# Patient Record
Sex: Female | Born: 1950 | Race: White | Hispanic: No | Marital: Married | Smoking: Former smoker
Health system: Southern US, Community
[De-identification: ages and names within clinical notes are randomized; demographics above are authoritative.]

## PROBLEM LIST (undated history)

## (undated) DIAGNOSIS — N883 Incompetence of cervix uteri: Secondary | ICD-10-CM

## (undated) DIAGNOSIS — I10 Essential (primary) hypertension: Secondary | ICD-10-CM

## (undated) DIAGNOSIS — E119 Type 2 diabetes mellitus without complications: Secondary | ICD-10-CM

## (undated) DIAGNOSIS — I050 Rheumatic mitral stenosis: Secondary | ICD-10-CM

## (undated) DIAGNOSIS — I421 Obstructive hypertrophic cardiomyopathy: Secondary | ICD-10-CM

## (undated) DIAGNOSIS — K219 Gastro-esophageal reflux disease without esophagitis: Secondary | ICD-10-CM

## (undated) DIAGNOSIS — M1990 Unspecified osteoarthritis, unspecified site: Secondary | ICD-10-CM

## (undated) DIAGNOSIS — I480 Paroxysmal atrial fibrillation: Secondary | ICD-10-CM

## (undated) DIAGNOSIS — G43909 Migraine, unspecified, not intractable, without status migrainosus: Secondary | ICD-10-CM

## (undated) DIAGNOSIS — J301 Allergic rhinitis due to pollen: Secondary | ICD-10-CM

## (undated) DIAGNOSIS — Z950 Presence of cardiac pacemaker: Secondary | ICD-10-CM

## (undated) DIAGNOSIS — M5430 Sciatica, unspecified side: Secondary | ICD-10-CM

## (undated) DIAGNOSIS — R011 Cardiac murmur, unspecified: Secondary | ICD-10-CM

## (undated) DIAGNOSIS — R0600 Dyspnea, unspecified: Secondary | ICD-10-CM

## (undated) DIAGNOSIS — Z8489 Family history of other specified conditions: Secondary | ICD-10-CM

## (undated) DIAGNOSIS — I350 Nonrheumatic aortic (valve) stenosis: Secondary | ICD-10-CM

## (undated) DIAGNOSIS — I459 Conduction disorder, unspecified: Secondary | ICD-10-CM

## (undated) HISTORY — DX: Hemochromatosis, unspecified: E83.119

## (undated) HISTORY — PX: CARDIAC SURGERY: SHX584

## (undated) HISTORY — DX: Allergic rhinitis due to pollen: J30.1

## (undated) HISTORY — PX: ELBOW SURGERY: SHX618

## (undated) HISTORY — PX: CARPAL TUNNEL RELEASE: SHX101

## (undated) HISTORY — DX: Sciatica, unspecified side: M54.30

## (undated) HISTORY — PX: PACEMAKER INSERTION: SHX728

## (undated) HISTORY — PX: WISDOM TOOTH EXTRACTION: SHX21

## (undated) HISTORY — DX: Incompetence of cervix uteri: N88.3

## (undated) HISTORY — PX: TONSILLECTOMY: SUR1361

## (undated) HISTORY — DX: Migraine, unspecified, not intractable, without status migrainosus: G43.909

## (undated) HISTORY — PX: TUBAL LIGATION: SHX77

---

## 2003-09-18 ENCOUNTER — Ambulatory Visit (HOSPITAL_COMMUNITY): Admission: RE | Admit: 2003-09-18 | Discharge: 2003-09-18 | Payer: Self-pay | Admitting: Family Medicine

## 2004-12-08 ENCOUNTER — Other Ambulatory Visit: Admission: RE | Admit: 2004-12-08 | Discharge: 2004-12-08 | Payer: Self-pay | Admitting: Family Medicine

## 2005-11-18 ENCOUNTER — Ambulatory Visit (HOSPITAL_COMMUNITY): Admission: RE | Admit: 2005-11-18 | Discharge: 2005-11-18 | Payer: Self-pay | Admitting: Family Medicine

## 2006-09-30 ENCOUNTER — Ambulatory Visit (HOSPITAL_COMMUNITY): Admission: RE | Admit: 2006-09-30 | Discharge: 2006-09-30 | Payer: Self-pay | Admitting: Family Medicine

## 2007-06-06 ENCOUNTER — Ambulatory Visit (HOSPITAL_COMMUNITY): Admission: RE | Admit: 2007-06-06 | Discharge: 2007-06-06 | Payer: Self-pay | Admitting: Family Medicine

## 2008-07-17 ENCOUNTER — Ambulatory Visit (HOSPITAL_COMMUNITY): Admission: RE | Admit: 2008-07-17 | Discharge: 2008-07-17 | Payer: Self-pay | Admitting: Family Medicine

## 2010-04-26 HISTORY — PX: ROTATOR CUFF REPAIR: SHX139

## 2010-09-11 NOTE — Procedures (Signed)
NAME:  [name], [name]                          ACCOUNT NO.:  [id number]   MEDICAL RECORD NO.:  [id number]                   PATIENT TYPE:  OUT   LOCATION:  RAD                                  FACILITY:  [facility]   PHYSICIAN:  [place] Bing, M.D.               DATE OF BIRTH:  09-Dec-1950   DATE OF PROCEDURE:  09/18/2003  DATE OF DISCHARGE:                                  ECHOCARDIOGRAM   CLINICAL DATA:  A 60 year old woman with hypertension and murmur.   M-MODE TRACINGS:  Aorta 2.4.   Left atrium 4.1.   Septum 1.5.   Posterior wall 1.2.   Left ventricular diastole 4.1.   Left ventricular systole 2.5.   IMPRESSION:  1. Technically adequate echocardiographic study.  2. Left atrial size at the upper limit of normal; normal right atrium.     Normal right ventricular size and function; mild right ventricular     hypertrophy. Probable small PFO.  3. Normal aortic, mitral, and tricuspid valves; mild aortic insufficiency.  4. Normal inferior vena cava.  5. Normal internal dimension of the left ventricle; mild to moderate left     ventricular hypertrophy. Disproportionate septal thickening; normal     regional and global left ventricular systolic function.      ___________________________________________                                            [place] Bing, M.D.   RR/MEDQ  D:  09/18/2003  T:  09/18/2003  Job:  [id number]

## 2010-11-06 ENCOUNTER — Other Ambulatory Visit (HOSPITAL_COMMUNITY): Payer: Self-pay | Admitting: Family Medicine

## 2010-11-06 DIAGNOSIS — Z139 Encounter for screening, unspecified: Secondary | ICD-10-CM

## 2010-11-10 ENCOUNTER — Ambulatory Visit (HOSPITAL_COMMUNITY): Payer: Self-pay

## 2010-11-17 ENCOUNTER — Telehealth: Payer: Self-pay

## 2010-11-17 NOTE — Telephone Encounter (Signed)
Called, busy

## 2010-11-19 NOTE — Telephone Encounter (Signed)
Mailed letter to call and schedule colonoscopy. Referred from Dr. Phillips Odor.

## 2011-02-09 ENCOUNTER — Encounter (HOSPITAL_COMMUNITY): Payer: Worker's Compensation

## 2011-02-09 ENCOUNTER — Ambulatory Visit (HOSPITAL_COMMUNITY)
Admission: RE | Admit: 2011-02-09 | Discharge: 2011-02-09 | Disposition: A | Discharge status: Home / Self Care | Payer: Worker's Compensation | Source: Ambulatory Visit | Attending: Specialist | Admitting: Specialist

## 2011-02-09 ENCOUNTER — Other Ambulatory Visit: Payer: Self-pay | Admitting: Specialist

## 2011-02-09 ENCOUNTER — Other Ambulatory Visit (HOSPITAL_COMMUNITY): Payer: Self-pay | Admitting: Specialist

## 2011-02-09 DIAGNOSIS — Z01812 Encounter for preprocedural laboratory examination: Secondary | ICD-10-CM | POA: Insufficient documentation

## 2011-02-09 DIAGNOSIS — Z01811 Encounter for preprocedural respiratory examination: Secondary | ICD-10-CM | POA: Insufficient documentation

## 2011-02-09 DIAGNOSIS — S43429A Sprain of unspecified rotator cuff capsule, initial encounter: Secondary | ICD-10-CM | POA: Insufficient documentation

## 2011-02-09 DIAGNOSIS — X58XXXA Exposure to other specified factors, initial encounter: Secondary | ICD-10-CM | POA: Insufficient documentation

## 2011-02-09 DIAGNOSIS — Z01810 Encounter for preprocedural cardiovascular examination: Secondary | ICD-10-CM | POA: Insufficient documentation

## 2011-02-09 LAB — BASIC METABOLIC PANEL
BUN: 24 mg/dL — ABNORMAL HIGH (ref 6–23)
CO2: 28 mEq/L (ref 19–32)
Chloride: 103 mEq/L (ref 96–112)
Potassium: 4.2 mEq/L (ref 3.5–5.1)
Sodium: 140 mEq/L (ref 135–145)

## 2011-02-09 LAB — CBC
HCT: 40.7 % (ref 36.0–46.0)
Hemoglobin: 13.9 g/dL (ref 12.0–15.0)
MCH: 30.6 pg (ref 26.0–34.0)
MCHC: 34.2 g/dL (ref 30.0–36.0)
MCV: 89.6 fL (ref 78.0–100.0)
Platelets: 278 10*3/uL (ref 150–400)
RBC: 4.54 MIL/uL (ref 3.87–5.11)
RDW: 13 % (ref 11.5–15.5)
WBC: 8.1 10*3/uL (ref 4.0–10.5)

## 2011-02-11 ENCOUNTER — Ambulatory Visit (HOSPITAL_COMMUNITY)
Admission: RE | Admit: 2011-02-11 | Discharge: 2011-02-12 | Disposition: A | Discharge status: Home / Self Care | Payer: Worker's Compensation | Source: Ambulatory Visit | Attending: Specialist | Admitting: Specialist

## 2011-02-11 DIAGNOSIS — Z01818 Encounter for other preprocedural examination: Secondary | ICD-10-CM | POA: Insufficient documentation

## 2011-02-11 DIAGNOSIS — S43429A Sprain of unspecified rotator cuff capsule, initial encounter: Secondary | ICD-10-CM | POA: Insufficient documentation

## 2011-02-11 DIAGNOSIS — Z01812 Encounter for preprocedural laboratory examination: Secondary | ICD-10-CM | POA: Insufficient documentation

## 2011-02-11 DIAGNOSIS — K219 Gastro-esophageal reflux disease without esophagitis: Secondary | ICD-10-CM | POA: Insufficient documentation

## 2011-02-11 DIAGNOSIS — X58XXXA Exposure to other specified factors, initial encounter: Secondary | ICD-10-CM | POA: Insufficient documentation

## 2011-02-11 DIAGNOSIS — E669 Obesity, unspecified: Secondary | ICD-10-CM | POA: Insufficient documentation

## 2011-02-11 DIAGNOSIS — Z01810 Encounter for preprocedural cardiovascular examination: Secondary | ICD-10-CM | POA: Insufficient documentation

## 2011-02-11 DIAGNOSIS — I10 Essential (primary) hypertension: Secondary | ICD-10-CM | POA: Insufficient documentation

## 2011-02-12 LAB — BASIC METABOLIC PANEL
Calcium: 9.4 mg/dL (ref 8.4–10.5)
Chloride: 99 mEq/L (ref 96–112)
Creatinine, Ser: 0.92 mg/dL (ref 0.50–1.10)
GFR calc non Af Amer: 65 mL/min — ABNORMAL LOW (ref >90)
Potassium: 3.8 mEq/L (ref 3.5–5.1)

## 2011-02-12 LAB — CBC
HCT: 37.6 % (ref 36.0–46.0)
Hemoglobin: 12.9 g/dL (ref 12.0–15.0)
MCV: 89.7 fL (ref 78.0–100.0)
RBC: 4.19 MIL/uL (ref 3.87–5.11)

## 2011-02-12 NOTE — Op Note (Signed)
[name], [name]                ACCOUNT NO.:  [id number]  MEDICAL RECORD NO.:  [id number]  LOCATION:  1603                         FACILITY:  [facility]  PHYSICIAN:  Jene Every, M.D.    DATE OF BIRTH:  [birth year]  DATE OF PROCEDURE:  02/11/2011 DATE OF DISCHARGE:                              OPERATIVE REPORT   PREOPERATIVE DIAGNOSIS:  Capsulitis and massive tear of the rotator cuff.  POSTOPERATIVE DIAGNOSIS:  Capsulitis and massive tear of the rotator cuff.  PROCEDURE PERFORMED: 1. Manipulation under anesthesia. 2. Mini open rotator cuff repair, subacromial decompression, repair of     rotator cuff utilizing Mitek suture anchors as well as push lock. 3. Lavage of glenohumeral joint.  ANESTHESIA:  General.  ASSISTANT:  Strader, utilized for soft tissue retraction and holding of the upper extremity.  HISTORY:  This is a pleasant 60 year old who sustained a work-related injury with massive tear of the rotator cuff.  The patient was actually injured 3 months previously.  She had a retracted tear of the rotator cuff.  No wedge infection or fracture of the humeral head.  Indicated for repair and exam under anesthesia.  Risks and benefits discussed including bleeding, infection, damage to neurovascular structures, suboptimal range of motion, DVT, PE, anesthetic complication, and nonhealing etc.  TECHNIQUE:  The patient in supine beach-chair position, after induction of adequate anesthesia and 2 g of Kefzol, the right shoulder and upper extremity were prepped and draped in usual sterile fashion. Manipulation under anesthesia was performed.  She had limited abduction and forward flexion, both augmented by gentle manipulation with appreciation of lysis of adhesions.  This was performed, securing the humerus proximally.  Next, surgical marker was utilized to delineate the acromion of the [facility] joint, a small 2 cm incision was made over the anterolateral aspect of the acromion.   Subcutaneous tissue was dissected with Bovie cautery utilized  to achieve hemostasis.  The raphe between the anterolateral heads was identified and divided in line with skin incision.  Subperiosteal elevated from the anterolateral and anteromedial aspect of the acromion with the release of the CA ligament and its small spur to the anterolateral aspect of the acromion was removed with a Theatre manager.  Noted was a wide retracted tear of the rotator cuff.  Significant subacromial and subrotator cuff adhesions, and they were gently, but meticulously lysed with a Cobb elevator extending, not more proximal than the glenohumeral joint.  This was done posteriorly, laterally, and anteriorly to a point over the humeral head. I did not extend distal to that.  Mobilizing the cuff revealed an intact biceps tendon, that is very attenuated, subscap residual was noted. There was a significant retracted tear of the infraspinatus, supraspinatus back to  near the midportion of the humeral head.  They were mobilized, I prepared the bed lateral to the articular surface. Medial to the greater tuberosity with a Matt Holmes rongeur.  Put 2 Mitek suture anchors and advanced the supraspinatus, the subscap, and the rotator cuff and the infraspinatus to that bed with Mitek suture anchors in excellent resistance to pullout.  They were threaded through the tendon and infraspinatus and supraspinatus, the arm in the abducted position.  Performed an appropriate surgical knot, crossed these suture and secured them over the lateral aspect of the greater tuberosity with the push locks utilizing all insertion of the suture and through the push lock and impaction of the humeral head with an excellent coverage noted of the humeral head by the supraspinatus and infraspinatus.  The superior portion of the subscap was examined.  There was a very little portion of the subscap noted.  I did, however, incorporate that with  the supraspinatus in the lateral aspect of the tuberosity.  This was in the superior portion of  that and there was a  portion of the subscapular was not advanceable, and it was a very attenuated.  We oversaw the subscap with supraspinatus with 0 Vicryl interrupted figure-of-eight sutures.  This was also included the Ethibond sutures in a double row technique.  Following this, we had full closure and coverage.  The wound was copiously irrigated.  Arm was in a gently abducted position, we advanced it approximately 1.5 cm.  Next, copiously irrigated the wound. Inspection revealed no evidence of active bleeding.  Repaired the raphe with #1 Vicryl in a figure-of-8 sutures over the top through the acromion, subcu with 2-0 Vicryl simple sutures.  Skin was reapproximated with 4-0 subcuticular Prolene.  Wound reinforced with Steri-Strips. Sterile dressing applied.  Placed supine on hospital bed and an abduction pillow, extubated without difficulty, and transported to the recovery room in satisfactory condition.  The patient tolerated the procedure well.  No complications.  Assistant was [employer].  Minimal blood loss.     Jene Every, M.D.     [name]  D:  02/11/2011  T:  02/11/2011  Job:  [id number]  Electronically Signed by Jene Every M.D. on 02/12/2011 09:14:19 AM

## 2011-04-09 ENCOUNTER — Other Ambulatory Visit (HOSPITAL_COMMUNITY): Payer: Self-pay | Admitting: Internal Medicine

## 2011-04-09 DIAGNOSIS — R609 Edema, unspecified: Secondary | ICD-10-CM

## 2011-04-12 ENCOUNTER — Ambulatory Visit (HOSPITAL_COMMUNITY)
Admission: RE | Admit: 2011-04-12 | Discharge: 2011-04-12 | Disposition: A | Discharge status: Home / Self Care | Payer: Worker's Compensation | Source: Ambulatory Visit | Attending: Internal Medicine | Admitting: Internal Medicine

## 2011-04-12 DIAGNOSIS — R609 Edema, unspecified: Secondary | ICD-10-CM

## 2011-04-12 DIAGNOSIS — M7989 Other specified soft tissue disorders: Secondary | ICD-10-CM | POA: Insufficient documentation

## 2011-08-02 ENCOUNTER — Telehealth: Payer: Self-pay

## 2011-08-02 NOTE — Telephone Encounter (Signed)
Pt would like a copy of her x-ray to have so she can take to an appt she has on Thursday 08-05-11 please call when ready for pick-up

## 2011-08-02 NOTE — Telephone Encounter (Signed)
Left message that CD ready for pick up to CB if any questions.

## 2012-05-19 ENCOUNTER — Other Ambulatory Visit (HOSPITAL_COMMUNITY): Payer: Self-pay | Admitting: Family Medicine

## 2012-05-19 DIAGNOSIS — Z139 Encounter for screening, unspecified: Secondary | ICD-10-CM

## 2012-05-25 ENCOUNTER — Ambulatory Visit (HOSPITAL_COMMUNITY)
Admission: RE | Admit: 2012-05-25 | Discharge: 2012-05-25 | Disposition: A | Discharge status: Home / Self Care | Payer: BC Managed Care – PPO | Source: Ambulatory Visit | Attending: Family Medicine | Admitting: Family Medicine

## 2012-05-25 DIAGNOSIS — Z139 Encounter for screening, unspecified: Secondary | ICD-10-CM

## 2012-05-25 DIAGNOSIS — Z1231 Encounter for screening mammogram for malignant neoplasm of breast: Secondary | ICD-10-CM | POA: Insufficient documentation

## 2012-06-20 ENCOUNTER — Telehealth: Payer: Self-pay

## 2012-06-20 NOTE — Telephone Encounter (Signed)
LM for pt to call to schedule colonoscopy.  

## 2012-07-06 NOTE — Telephone Encounter (Signed)
LM for pt to call

## 2012-07-07 NOTE — Telephone Encounter (Signed)
Letter to pt

## 2012-07-12 NOTE — Telephone Encounter (Signed)
Letter to PCP

## 2013-10-02 ENCOUNTER — Other Ambulatory Visit (HOSPITAL_COMMUNITY): Payer: Self-pay | Admitting: Family Medicine

## 2013-10-02 DIAGNOSIS — Z139 Encounter for screening, unspecified: Secondary | ICD-10-CM

## 2013-10-04 ENCOUNTER — Ambulatory Visit (HOSPITAL_COMMUNITY)
Admission: RE | Admit: 2013-10-04 | Discharge: 2013-10-04 | Disposition: A | Discharge status: Home / Self Care | Payer: Medicare PPO | Source: Ambulatory Visit | Attending: Family Medicine | Admitting: Family Medicine

## 2013-10-04 DIAGNOSIS — Z139 Encounter for screening, unspecified: Secondary | ICD-10-CM

## 2013-10-04 DIAGNOSIS — Z1231 Encounter for screening mammogram for malignant neoplasm of breast: Secondary | ICD-10-CM | POA: Insufficient documentation

## 2013-12-19 ENCOUNTER — Other Ambulatory Visit (HOSPITAL_COMMUNITY): Payer: Self-pay | Admitting: Physician Assistant

## 2013-12-19 ENCOUNTER — Ambulatory Visit (HOSPITAL_COMMUNITY)
Admission: RE | Admit: 2013-12-19 | Discharge: 2013-12-19 | Disposition: A | Discharge status: Home / Self Care | Payer: Medicare PPO | Source: Ambulatory Visit | Attending: Physician Assistant | Admitting: Physician Assistant

## 2013-12-19 ENCOUNTER — Encounter (INDEPENDENT_AMBULATORY_CARE_PROVIDER_SITE_OTHER): Payer: Self-pay

## 2013-12-19 DIAGNOSIS — M545 Low back pain, unspecified: Secondary | ICD-10-CM | POA: Diagnosis present

## 2013-12-19 DIAGNOSIS — M5137 Other intervertebral disc degeneration, lumbosacral region: Secondary | ICD-10-CM | POA: Insufficient documentation

## 2013-12-19 DIAGNOSIS — M51379 Other intervertebral disc degeneration, lumbosacral region without mention of lumbar back pain or lower extremity pain: Secondary | ICD-10-CM | POA: Insufficient documentation

## 2013-12-19 DIAGNOSIS — M25559 Pain in unspecified hip: Secondary | ICD-10-CM | POA: Insufficient documentation

## 2013-12-20 ENCOUNTER — Other Ambulatory Visit (HOSPITAL_COMMUNITY): Payer: Self-pay | Admitting: Physician Assistant

## 2013-12-20 DIAGNOSIS — M545 Low back pain: Secondary | ICD-10-CM

## 2013-12-25 ENCOUNTER — Ambulatory Visit (HOSPITAL_COMMUNITY)
Admission: RE | Admit: 2013-12-25 | Discharge: 2013-12-25 | Disposition: A | Discharge status: Home / Self Care | Payer: Medicare PPO | Source: Ambulatory Visit | Attending: Physician Assistant | Admitting: Physician Assistant

## 2013-12-25 DIAGNOSIS — M545 Low back pain, unspecified: Secondary | ICD-10-CM | POA: Diagnosis present

## 2013-12-25 DIAGNOSIS — W19XXXA Unspecified fall, initial encounter: Secondary | ICD-10-CM | POA: Diagnosis not present

## 2013-12-25 DIAGNOSIS — S32009A Unspecified fracture of unspecified lumbar vertebra, initial encounter for closed fracture: Secondary | ICD-10-CM | POA: Insufficient documentation

## 2013-12-25 DIAGNOSIS — E279 Disorder of adrenal gland, unspecified: Secondary | ICD-10-CM | POA: Insufficient documentation

## 2013-12-25 DIAGNOSIS — S22009A Unspecified fracture of unspecified thoracic vertebra, initial encounter for closed fracture: Secondary | ICD-10-CM | POA: Diagnosis not present

## 2014-05-23 ENCOUNTER — Other Ambulatory Visit (HOSPITAL_COMMUNITY): Payer: Self-pay | Admitting: Family Medicine

## 2014-05-23 DIAGNOSIS — E6609 Other obesity due to excess calories: Secondary | ICD-10-CM | POA: Diagnosis not present

## 2014-05-23 DIAGNOSIS — Z6832 Body mass index (BMI) 32.0-32.9, adult: Secondary | ICD-10-CM | POA: Diagnosis not present

## 2014-05-23 DIAGNOSIS — M545 Low back pain: Secondary | ICD-10-CM

## 2014-05-23 DIAGNOSIS — Z0000 Encounter for general adult medical examination without abnormal findings: Secondary | ICD-10-CM | POA: Diagnosis not present

## 2014-05-28 ENCOUNTER — Encounter (INDEPENDENT_AMBULATORY_CARE_PROVIDER_SITE_OTHER): Payer: Self-pay | Admitting: *Deleted

## 2014-05-28 ENCOUNTER — Ambulatory Visit (HOSPITAL_COMMUNITY)
Admission: RE | Admit: 2014-05-28 | Discharge: 2014-05-28 | Disposition: A | Discharge status: Home / Self Care | Payer: Commercial Managed Care - HMO | Source: Ambulatory Visit | Attending: Family Medicine | Admitting: Family Medicine

## 2014-05-28 DIAGNOSIS — Z780 Asymptomatic menopausal state: Secondary | ICD-10-CM | POA: Diagnosis not present

## 2014-05-28 DIAGNOSIS — M8580 Other specified disorders of bone density and structure, unspecified site: Secondary | ICD-10-CM | POA: Diagnosis not present

## 2014-05-28 DIAGNOSIS — M8589 Other specified disorders of bone density and structure, multiple sites: Secondary | ICD-10-CM | POA: Diagnosis not present

## 2014-05-28 DIAGNOSIS — M545 Low back pain: Secondary | ICD-10-CM

## 2014-05-28 DIAGNOSIS — R29890 Loss of height: Secondary | ICD-10-CM | POA: Insufficient documentation

## 2014-06-06 ENCOUNTER — Encounter (INDEPENDENT_AMBULATORY_CARE_PROVIDER_SITE_OTHER): Payer: Self-pay | Admitting: *Deleted

## 2014-06-06 ENCOUNTER — Other Ambulatory Visit (INDEPENDENT_AMBULATORY_CARE_PROVIDER_SITE_OTHER): Payer: Self-pay | Admitting: *Deleted

## 2014-06-06 DIAGNOSIS — Z1211 Encounter for screening for malignant neoplasm of colon: Secondary | ICD-10-CM

## 2014-06-12 DIAGNOSIS — H524 Presbyopia: Secondary | ICD-10-CM | POA: Diagnosis not present

## 2014-06-12 DIAGNOSIS — H5210 Myopia, unspecified eye: Secondary | ICD-10-CM | POA: Diagnosis not present

## 2014-06-24 ENCOUNTER — Telehealth (INDEPENDENT_AMBULATORY_CARE_PROVIDER_SITE_OTHER): Payer: Self-pay | Admitting: *Deleted

## 2014-06-24 DIAGNOSIS — Z1211 Encounter for screening for malignant neoplasm of colon: Secondary | ICD-10-CM

## 2014-06-24 NOTE — Telephone Encounter (Signed)
Patient needs movi prep 

## 2014-06-25 ENCOUNTER — Telehealth (INDEPENDENT_AMBULATORY_CARE_PROVIDER_SITE_OTHER): Payer: Self-pay | Admitting: *Deleted

## 2014-06-25 DIAGNOSIS — Z1211 Encounter for screening for malignant neoplasm of colon: Secondary | ICD-10-CM

## 2014-06-25 MED ORDER — PEG-KCL-NACL-NASULF-NA ASC-C 100 G PO SOLR: 1.0000 | Freq: Once | ORAL | Status: DC

## 2014-06-25 NOTE — Telephone Encounter (Addendum)
Patient needs trilyte, movi prep not covered

## 2014-06-28 MED ORDER — PEG 3350-KCL-NA BICARB-NACL 420 G PO SOLR: 4000.0000 mL | Freq: Once | ORAL | Status: DC

## 2014-07-05 ENCOUNTER — Encounter (INDEPENDENT_AMBULATORY_CARE_PROVIDER_SITE_OTHER): Payer: Self-pay | Admitting: *Deleted

## 2014-07-05 ENCOUNTER — Telehealth (INDEPENDENT_AMBULATORY_CARE_PROVIDER_SITE_OTHER): Payer: Self-pay | Admitting: *Deleted

## 2014-07-05 NOTE — Telephone Encounter (Signed)
Referring MD/PCP: koberlein   Procedure: tcs  Reason/Indication:  screening  Has patient had this procedure before?  no  If so, when, by whom and where?    Is there a family history of colon cancer?  no  Who?  What age when diagnosed?    Is patient diabetic?   no      Does patient have prosthetic heart valve?  no  Do you have a pacemaker?  no  Has patient ever had endocarditis? no  Has patient had joint replacement within last 12 months?  no  Does patient tend to be constipated or take laxatives? no  Is patient on Coumadin, Plavix and/or Aspirin? no  Medications: losartan/hctz 100/25 mg daily, omeprazole 40 mg daily, meloxicam 7.5 mg daily  Allergies: statin drugs  Medication Adjustment:   Procedure date & time: 08/01/14 at 1025

## 2014-07-09 NOTE — Telephone Encounter (Signed)
agree

## 2014-08-01 ENCOUNTER — Encounter (HOSPITAL_COMMUNITY): Payer: Self-pay | Admitting: *Deleted

## 2014-08-01 ENCOUNTER — Ambulatory Visit (HOSPITAL_COMMUNITY)
Admission: RE | Admit: 2014-08-01 | Discharge: 2014-08-01 | Disposition: A | Discharge status: Home / Self Care | Payer: Commercial Managed Care - HMO | Source: Ambulatory Visit | Attending: Internal Medicine | Admitting: Internal Medicine

## 2014-08-01 ENCOUNTER — Encounter (HOSPITAL_COMMUNITY)
Admission: RE | Disposition: A | Discharge status: Home / Self Care | Payer: Self-pay | Source: Ambulatory Visit | Attending: Internal Medicine

## 2014-08-01 DIAGNOSIS — K5730 Diverticulosis of large intestine without perforation or abscess without bleeding: Secondary | ICD-10-CM | POA: Diagnosis not present

## 2014-08-01 DIAGNOSIS — I10 Essential (primary) hypertension: Secondary | ICD-10-CM | POA: Insufficient documentation

## 2014-08-01 DIAGNOSIS — Z87891 Personal history of nicotine dependence: Secondary | ICD-10-CM | POA: Insufficient documentation

## 2014-08-01 DIAGNOSIS — D128 Benign neoplasm of rectum: Secondary | ICD-10-CM | POA: Diagnosis not present

## 2014-08-01 DIAGNOSIS — Z79899 Other long term (current) drug therapy: Secondary | ICD-10-CM | POA: Insufficient documentation

## 2014-08-01 DIAGNOSIS — Z9851 Tubal ligation status: Secondary | ICD-10-CM | POA: Diagnosis not present

## 2014-08-01 DIAGNOSIS — R938 Abnormal findings on diagnostic imaging of other specified body structures: Secondary | ICD-10-CM | POA: Insufficient documentation

## 2014-08-01 DIAGNOSIS — D123 Benign neoplasm of transverse colon: Secondary | ICD-10-CM | POA: Insufficient documentation

## 2014-08-01 DIAGNOSIS — Z1211 Encounter for screening for malignant neoplasm of colon: Secondary | ICD-10-CM | POA: Diagnosis not present

## 2014-08-01 DIAGNOSIS — Z7951 Long term (current) use of inhaled steroids: Secondary | ICD-10-CM | POA: Diagnosis not present

## 2014-08-01 DIAGNOSIS — D125 Benign neoplasm of sigmoid colon: Secondary | ICD-10-CM | POA: Insufficient documentation

## 2014-08-01 DIAGNOSIS — K6289 Other specified diseases of anus and rectum: Secondary | ICD-10-CM | POA: Diagnosis not present

## 2014-08-01 HISTORY — PX: COLONOSCOPY: SHX5424

## 2014-08-01 HISTORY — DX: Cardiac murmur, unspecified: R01.1

## 2014-08-01 HISTORY — DX: Essential (primary) hypertension: I10

## 2014-08-01 SURGERY — COLONOSCOPY
Anesthesia: Moderate Sedation

## 2014-08-01 MED ORDER — MEPERIDINE HCL 50 MG/ML IJ SOLN
INTRAMUSCULAR | Status: AC
  Filled 2014-08-01: qty 1

## 2014-08-01 MED ORDER — BENEFIBER DRINK MIX PO PACK: 4.0000 g | PACK | Freq: Every day | ORAL | Status: DC

## 2014-08-01 MED ORDER — MEPERIDINE HCL 50 MG/ML IJ SOLN
INTRAMUSCULAR | Status: DC | PRN
  Administered 2014-08-01 (×2): 25 mg via INTRAVENOUS

## 2014-08-01 MED ORDER — SODIUM CHLORIDE 0.9 % IV SOLN
INTRAVENOUS | Status: DC
  Administered 2014-08-01: 10:00:00 via INTRAVENOUS

## 2014-08-01 MED ORDER — DICYCLOMINE HCL 10 MG PO CAPS: 10.0000 mg | ORAL_CAPSULE | Freq: Three times a day (TID) | ORAL | Status: DC | PRN

## 2014-08-01 MED ORDER — MIDAZOLAM HCL 5 MG/5ML IJ SOLN
INTRAMUSCULAR | Status: DC | PRN
  Administered 2014-08-01: 2 mg via INTRAVENOUS
  Administered 2014-08-01: 3 mg via INTRAVENOUS
  Administered 2014-08-01 (×2): 2 mg via INTRAVENOUS

## 2014-08-01 MED ORDER — SIMETHICONE 40 MG/0.6ML PO SUSP
ORAL | Status: DC | PRN
  Administered 2014-08-01: 10:00:00

## 2014-08-01 MED ORDER — MIDAZOLAM HCL 5 MG/5ML IJ SOLN
INTRAMUSCULAR | Status: AC
  Filled 2014-08-01: qty 10

## 2014-08-01 NOTE — H&P (Signed)
[name] is an 64 y.o. female.   Chief Complaint: Patient's here for colonoscopy. HPI: Patient is 64 year old Caucasian female who is here for screening examination. This is patient's first exam. She denies abdominal pain or rectal bleeding. Lately she's noted postprandial bloating and some urgency. She has good appetite her weight has been stable. Family history is negative for CRC.  Past Medical History  Diagnosis Date  . Hypertension   . Heart murmur     Past Surgical History  Procedure Laterality Date  . Tubal ligation    . Tonsillectomy    . Rotator cuff repair Right 2012    History reviewed. No pertinent family history. Social History:  reports that she has quit smoking. She does not have any smokeless tobacco history on file. She reports that she drinks alcohol. She reports that she does not use illicit drugs.  Allergies: No Known Allergies  Medications Prior to Admission  Medication Sig Dispense Refill  . fluticasone (FLONASE) 50 MCG/ACT nasal spray Place 2 sprays into both nostrils daily as needed for allergies or rhinitis.    Marland Kitchen losartan-hydrochlorothiazide (HYZAAR) 100-25 MG per tablet Take 1 tablet by mouth daily.  3  . omeprazole (PRILOSEC) 40 MG capsule Take 40 mg by mouth daily.  3  . polyethylene glycol-electrolytes (NULYTELY/GOLYTELY) 420 G solution Take 4,000 mLs by mouth once. 4000 mL 0  . zolpidem (AMBIEN) 10 MG tablet Take 5 mg by mouth at bedtime.  2    No results found for this or any previous visit (from the past 48 hour(s)). No results found.  ROS  Blood pressure 179/87, pulse 67, temperature 97.6 F (36.4 C), temperature source Oral, resp. rate 18, height 5\' 10"  (1.778 m), weight 227 lb (102.967 kg), SpO2 95 %. Physical Exam  Constitutional: She appears well-developed and well-nourished.  HENT:  Mouth/Throat: Oropharynx is clear and moist.  Eyes: Conjunctivae are normal. No scleral icterus.  Neck: No thyromegaly present.  Cardiovascular:  Normal rate and regular rhythm.   Murmur (grade 2/6 systolic ejection murmur best heard at left sternal border.) heard. GI: Soft. She exhibits no distension and no mass. There is no tenderness.  Musculoskeletal: She exhibits no edema.  Lymphadenopathy:    She has no cervical adenopathy.  Neurological: She is alert.  Skin: Skin is warm and dry.     Assessment/Plan Average risk screening colonoscopy.  Pierce Barocio U 08/01/2014, 9:54 AM

## 2014-08-01 NOTE — Op Note (Signed)
COLONOSCOPY PROCEDURE REPORT  PATIENT:  [name]  MR#:  [id number] Birthdate:  03-Jun-1950, 64 y.o., female Endoscopist:  Dr. Rogene Houston, MD Referred By:  Dr. Purvis Kilts, MD  Procedure Date: 08/01/2014  Procedure:   Colonoscopy  Indications:  Patient is 32 old Caucasian female was undergoing average risk screening colonoscopy. This is patient's first exam. She does give history of intermittent postprandial bloating and urgency.  Informed Consent:  The procedure and risks were reviewed with the patient and informed consent was obtained.  Medications:  Demerol 50 mg IV Versed 9 mg IV  Description of procedure:  After a digital rectal exam was performed, that colonoscope was advanced from the anus through the rectum and colon to the area of the cecum, ileocecal valve and appendiceal orifice. The cecum was deeply intubated. These structures were well-seen and photographed for the record. From the level of the cecum and ileocecal valve, the scope was slowly and cautiously withdrawn. The mucosal surfaces were carefully surveyed utilizing scope tip to flexion to facilitate fold flattening as needed. The scope was pulled down into the rectum where a thorough exam including retroflexion was performed.  Findings:  Prep excellent. Two small polyps ablated via cold biopsy and submitted together. One was located at proximal transverse colon and other one at proximal sigmoid colon. Moderate number of diverticula at sigmoid colon. Normal rectal mucosa. Focal thickening to anoderm.   Therapeutic/Diagnostic Maneuvers Performed:  See above  Complications:  None  Cecal Withdrawal Time:  14 minutes  Impression:  Examination performed to cecum. Two small polyps ablated while cold biopsy and submitted together(transverse and sigmoid colon). Moderate sigmoid colon diverticulosis.  Recommendations:  Standard instructions given. High fiber diet. Benefiber 4 g by mouth daily at  bedtime. Dicyclomine 10 mg by mouth three times a day when necessary. I will be contacting patient with biopsy results and further recommendations.  REHMAN,NAJEEB U  08/01/2014 10:32 AM  CC: Dr. [name], Betsy Coder, MD & Dr. Rayne Du ref. provider found

## 2014-08-01 NOTE — Discharge Instructions (Signed)
Resume usual medications. Dicyclomine 10 mg by mouth up to three times a day as needed  High fiber diet. Benefiber 4 g by mouth daily at bedtime No driving for 24 hours. Physician will call with biopsy results  Colonoscopy, Care After These instructions give you information on caring for yourself after your procedure. Your doctor may also give you more specific instructions. Call your doctor if you have any problems or questions after your procedure. HOME CARE  Do not drive for 24 hours.  Do not sign important papers or use machinery for 24 hours.  You may shower.  You may go back to your usual activities, but go slower for the first 24 hours.  Take rest breaks often during the first 24 hours.  Walk around or use warm packs on your belly (abdomen) if you have belly cramping or gas.  Drink enough fluids to keep your pee (urine) clear or pale yellow.  Resume your normal diet. Avoid heavy or fried foods.  Avoid drinking alcohol for 24 hours or as told by your doctor.  Only take medicines as told by your doctor. If a tissue sample (biopsy) was taken during the procedure:   Do not take aspirin or blood thinners for 7 days, or as told by your doctor.  Do not drink alcohol for 7 days, or as told by your doctor.  Eat soft foods for the first 24 hours. GET HELP IF: You still have a small amount of blood in your poop (stool) 2-3 days after the procedure. GET HELP RIGHT AWAY IF:  You have more than a small amount of blood in your poop.  You see clumps of tissue (blood clots) in your poop.  Your belly is puffy (swollen).  You feel sick to your stomach (nauseous) or throw up (vomit).  You have a fever.  You have belly pain that gets worse and medicine does not help. MAKE SURE YOU:  Understand these instructions.  Will watch your condition.  Will get help right away if you are not doing well or get worse. Document Released: 05/15/2010 Document Revised: 04/17/2013 Document  Reviewed: 12/18/2012 [facility] Patient Information 2015 [place], [place]. This information is not intended to replace advice given to you by your health care provider. Make sure you discuss any questions you have with your health care provider.   Diverticulosis Diverticulosis is the condition that develops when small pouches (diverticula) form in the wall of your colon. Your colon, or large intestine, is where water is absorbed and stool is formed. The pouches form when the inside layer of your colon pushes through weak spots in the outer layers of your colon. CAUSES  No one knows exactly what causes diverticulosis. RISK FACTORS  Being older than 49. Your risk for this condition increases with age. Diverticulosis is rare in people younger than 40 years. By age 33, almost everyone has it.  Eating a low-fiber diet.  Being frequently constipated.  Being overweight.  Not getting enough exercise.  Smoking.  Taking over-the-counter pain medicines, like aspirin and ibuprofen. SYMPTOMS  Most people with diverticulosis do not have symptoms. DIAGNOSIS  Because diverticulosis often has no symptoms, health care providers often discover the condition during an exam for other colon problems. In many cases, a health care provider will diagnose diverticulosis while using a flexible scope to examine the colon (colonoscopy). TREATMENT  If you have never developed an infection related to diverticulosis, you may not need treatment. If you have had an infection before, treatment  may include:  Eating more fruits, vegetables, and grains.  Taking a fiber supplement.  Taking a live bacteria supplement (probiotic).  Taking medicine to relax your colon. HOME CARE INSTRUCTIONS   Drink at least 6-8 glasses of water each day to prevent constipation.  Try not to strain when you have a bowel movement.  Keep all follow-up appointments. If you have had an infection before:  Increase the fiber in your diet  as directed by your health care provider or dietitian.  Take a dietary fiber supplement if your health care provider approves.  Only take medicines as directed by your health care provider. SEEK MEDICAL CARE IF:   You have abdominal pain.  You have bloating.  You have cramps.  You have not gone to the bathroom in 3 days. SEEK IMMEDIATE MEDICAL CARE IF:   Your pain gets worse.  Yourbloating becomes very bad.  You have a fever or chills, and your symptoms suddenly get worse.  You begin vomiting.  You have bowel movements that are bloody or black. MAKE SURE YOU:  Understand these instructions.  Will watch your condition.  Will get help right away if you are not doing well or get worse. Document Released: 01/08/2004 Document Revised: 04/17/2013 Document Reviewed: 03/07/2013 [facility] Patient Information 2015 [place], [place]. This information is not intended to replace advice given to you by your health care provider. Make sure you discuss any questions you have with your health care provider.  Colon Polyps Polyps are lumps of extra tissue growing inside the body. Polyps can grow in the large intestine (colon). Most colon polyps are noncancerous (benign). However, some colon polyps can become cancerous over time. Polyps that are larger than a pea may be harmful. To be safe, caregivers remove and test all polyps. CAUSES  Polyps form when mutations in the genes cause your cells to grow and divide even though no more tissue is needed. RISK FACTORS There are a number of risk factors that can increase your chances of getting colon polyps. They include: Being older than 50 years. Family history of colon polyps or colon cancer. Long-term colon diseases, such as colitis or Crohn disease. Being overweight. Smoking. Being inactive. Drinking too much alcohol. SYMPTOMS  Most small polyps do not cause symptoms. If symptoms are present, they may include: Blood in the stool. The stool  may look dark red or black. Constipation or diarrhea that lasts longer than 1 week. DIAGNOSIS People often do not know they have polyps until their caregiver finds them during a regular checkup. Your caregiver can use 4 tests to check for polyps: Digital rectal exam. The caregiver wears gloves and feels inside the rectum. This test would find polyps only in the rectum. Barium enema. The caregiver puts a liquid called barium into your rectum before taking X-rays of your colon. Barium makes your colon look white. Polyps are dark, so they are easy to see in the X-ray pictures. Sigmoidoscopy. A thin, flexible tube (sigmoidoscope) is placed into your rectum. The sigmoidoscope has a light and tiny camera in it. The caregiver uses the sigmoidoscope to look at the last third of your colon. Colonoscopy. This test is like sigmoidoscopy, but the caregiver looks at the entire colon. This is the most common method for finding and removing polyps. TREATMENT  Any polyps will be removed during a sigmoidoscopy or colonoscopy. The polyps are then tested for cancer. PREVENTION  To help lower your risk of getting more colon polyps: Eat plenty of fruits and  vegetables. Avoid eating fatty foods. Do not smoke. Avoid drinking alcohol. Exercise every day. Lose weight if recommended by your caregiver. Eat plenty of calcium and folate. Foods that are rich in calcium include milk, cheese, and broccoli. Foods that are rich in folate include chickpeas, kidney beans, and spinach. HOME CARE INSTRUCTIONS Keep all follow-up appointments as directed by your caregiver. You may need periodic exams to check for polyps. SEEK MEDICAL CARE IF: You notice bleeding during a bowel movement. Document Released: 01/07/2004 Document Revised: 07/05/2011 Document Reviewed: 06/22/2011 [facility] Patient Information 2015 [place], [place]. This information is not intended to replace advice given to you by your health care provider. Make sure you  discuss any questions you have with your health care provider. High-Fiber Diet Fiber is found in fruits, vegetables, and grains. A high-fiber diet encourages the addition of more whole grains, legumes, fruits, and vegetables in your diet. The recommended amount of fiber for adult males is 38 g per day. For adult females, it is 25 g per day. Pregnant and lactating women should get 28 g of fiber per day. If you have a digestive or bowel problem, ask your caregiver for advice before adding high-fiber foods to your diet. Eat a variety of high-fiber foods instead of only a select few type of foods.  PURPOSE  To increase stool bulk.  To make bowel movements more regular to prevent constipation.  To lower cholesterol.  To prevent overeating. WHEN IS THIS DIET USED?  It may be used if you have constipation and hemorrhoids.  It may be used if you have uncomplicated diverticulosis (intestine condition) and irritable bowel syndrome.  It may be used if you need help with weight management.  It may be used if you want to add it to your diet as a protective measure against atherosclerosis, diabetes, and cancer. SOURCES OF FIBER  Whole-grain breads and cereals.  Fruits, such as apples, oranges, bananas, berries, prunes, and pears.  Vegetables, such as green peas, carrots, sweet potatoes, beets, broccoli, cabbage, spinach, and artichokes.  Legumes, such split peas, soy, lentils.  Almonds. FIBER CONTENT IN FOODS Starches and Grains / Dietary Fiber (g)  Cheerios, 1 cup / 3 g  Corn Flakes cereal, 1 cup / 0.7 g  Rice crispy treat cereal, 1 cup / 0.3 g  Instant oatmeal (cooked),  cup / 2 g  Frosted wheat cereal, 1 cup / 5.1 g  Brown, long-grain rice (cooked), 1 cup / 3.5 g  White, long-grain rice (cooked), 1 cup / 0.6 g  Enriched macaroni (cooked), 1 cup / 2.5 g Legumes / Dietary Fiber (g)  Baked beans (canned, plain, or vegetarian),  cup / 5.2 g  Kidney beans (canned),  cup /  6.8 g  Pinto beans (cooked),  cup / 5.5 g Breads and Crackers / Dietary Fiber (g)  Plain or honey graham crackers, 2 squares / 0.7 g  Saltine crackers, 3 squares / 0.3 g  Plain, salted pretzels, 10 pieces / 1.8 g  Whole-wheat bread, 1 slice / 1.9 g  White bread, 1 slice / 0.7 g  Raisin bread, 1 slice / 1.2 g  Plain bagel, 3 oz / 2 g  Flour tortilla, 1 oz / 0.9 g  Corn tortilla, 1 small / 1.5 g  Hamburger or hotdog bun, 1 small / 0.9 g Fruits / Dietary Fiber (g)  Apple with skin, 1 medium / 4.4 g  Sweetened applesauce,  cup / 1.5 g  Banana,  medium / 1.5 g  Grapes, 10 grapes / 0.4 g  Orange, 1 small / 2.3 g  Raisin, 1.5 oz / 1.6 g  Melon, 1 cup / 1.4 g Vegetables / Dietary Fiber (g)  Green beans (canned),  cup / 1.3 g  Carrots (cooked),  cup / 2.3 g  Broccoli (cooked),  cup / 2.8 g  Peas (cooked),  cup / 4.4 g  Mashed potatoes,  cup / 1.6 g  Lettuce, 1 cup / 0.5 g  Corn (canned),  cup / 1.6 g  Tomato,  cup / 1.1 g Document Released: 04/12/2005 Document Revised: 10/12/2011 Document Reviewed: 07/15/2011 [employer] Patient Information 2015 [place], [place]. This information is not intended to replace advice given to you by your health care provider. Make sure you discuss any questions you have with your health care provider.

## 2014-08-02 ENCOUNTER — Encounter (HOSPITAL_COMMUNITY): Payer: Self-pay | Admitting: Internal Medicine

## 2014-08-12 ENCOUNTER — Encounter (INDEPENDENT_AMBULATORY_CARE_PROVIDER_SITE_OTHER): Payer: Self-pay | Admitting: *Deleted

## 2014-10-10 ENCOUNTER — Other Ambulatory Visit (HOSPITAL_COMMUNITY): Payer: Self-pay | Admitting: Family Medicine

## 2014-10-10 DIAGNOSIS — Z1231 Encounter for screening mammogram for malignant neoplasm of breast: Secondary | ICD-10-CM

## 2014-10-16 ENCOUNTER — Ambulatory Visit (HOSPITAL_COMMUNITY)
Admission: RE | Admit: 2014-10-16 | Discharge: 2014-10-16 | Disposition: A | Discharge status: Home / Self Care | Payer: Commercial Managed Care - HMO | Source: Ambulatory Visit | Attending: Family Medicine | Admitting: Family Medicine

## 2014-10-16 DIAGNOSIS — Z1231 Encounter for screening mammogram for malignant neoplasm of breast: Secondary | ICD-10-CM | POA: Insufficient documentation

## 2014-10-31 DIAGNOSIS — R7309 Other abnormal glucose: Secondary | ICD-10-CM | POA: Diagnosis not present

## 2014-10-31 DIAGNOSIS — I10 Essential (primary) hypertension: Secondary | ICD-10-CM | POA: Diagnosis not present

## 2014-10-31 DIAGNOSIS — E6609 Other obesity due to excess calories: Secondary | ICD-10-CM | POA: Diagnosis not present

## 2014-10-31 DIAGNOSIS — E782 Mixed hyperlipidemia: Secondary | ICD-10-CM | POA: Diagnosis not present

## 2014-10-31 DIAGNOSIS — G4700 Insomnia, unspecified: Secondary | ICD-10-CM | POA: Diagnosis not present

## 2014-10-31 DIAGNOSIS — Z6832 Body mass index (BMI) 32.0-32.9, adult: Secondary | ICD-10-CM | POA: Diagnosis not present

## 2014-11-04 DIAGNOSIS — I10 Essential (primary) hypertension: Secondary | ICD-10-CM | POA: Diagnosis not present

## 2014-11-04 DIAGNOSIS — Z6832 Body mass index (BMI) 32.0-32.9, adult: Secondary | ICD-10-CM | POA: Diagnosis not present

## 2014-11-04 DIAGNOSIS — E782 Mixed hyperlipidemia: Secondary | ICD-10-CM | POA: Diagnosis not present

## 2014-11-04 DIAGNOSIS — R7309 Other abnormal glucose: Secondary | ICD-10-CM | POA: Diagnosis not present

## 2014-11-11 ENCOUNTER — Other Ambulatory Visit (INDEPENDENT_AMBULATORY_CARE_PROVIDER_SITE_OTHER): Payer: Self-pay | Admitting: Internal Medicine

## 2015-02-24 DIAGNOSIS — Z6832 Body mass index (BMI) 32.0-32.9, adult: Secondary | ICD-10-CM | POA: Diagnosis not present

## 2015-02-24 DIAGNOSIS — Z1389 Encounter for screening for other disorder: Secondary | ICD-10-CM | POA: Diagnosis not present

## 2015-02-24 DIAGNOSIS — D239 Other benign neoplasm of skin, unspecified: Secondary | ICD-10-CM | POA: Diagnosis not present

## 2015-06-10 DIAGNOSIS — H524 Presbyopia: Secondary | ICD-10-CM | POA: Diagnosis not present

## 2015-06-10 DIAGNOSIS — H5210 Myopia, unspecified eye: Secondary | ICD-10-CM | POA: Diagnosis not present

## 2015-06-10 DIAGNOSIS — Z0100 Encounter for examination of eyes and vision without abnormal findings: Secondary | ICD-10-CM | POA: Diagnosis not present

## 2015-06-19 DIAGNOSIS — R7309 Other abnormal glucose: Secondary | ICD-10-CM | POA: Diagnosis not present

## 2015-06-19 DIAGNOSIS — I10 Essential (primary) hypertension: Secondary | ICD-10-CM | POA: Diagnosis not present

## 2015-06-19 DIAGNOSIS — E6609 Other obesity due to excess calories: Secondary | ICD-10-CM | POA: Diagnosis not present

## 2015-06-19 DIAGNOSIS — Z6832 Body mass index (BMI) 32.0-32.9, adult: Secondary | ICD-10-CM | POA: Diagnosis not present

## 2015-06-19 DIAGNOSIS — Z1389 Encounter for screening for other disorder: Secondary | ICD-10-CM | POA: Diagnosis not present

## 2015-08-20 DIAGNOSIS — I10 Essential (primary) hypertension: Secondary | ICD-10-CM | POA: Diagnosis not present

## 2015-08-20 DIAGNOSIS — R7309 Other abnormal glucose: Secondary | ICD-10-CM | POA: Diagnosis not present

## 2015-08-20 DIAGNOSIS — D239 Other benign neoplasm of skin, unspecified: Secondary | ICD-10-CM | POA: Diagnosis not present

## 2015-08-20 DIAGNOSIS — E782 Mixed hyperlipidemia: Secondary | ICD-10-CM | POA: Diagnosis not present

## 2015-08-21 DIAGNOSIS — R011 Cardiac murmur, unspecified: Secondary | ICD-10-CM | POA: Diagnosis not present

## 2015-08-21 DIAGNOSIS — Z6835 Body mass index (BMI) 35.0-35.9, adult: Secondary | ICD-10-CM | POA: Diagnosis not present

## 2015-08-21 DIAGNOSIS — Z0001 Encounter for general adult medical examination with abnormal findings: Secondary | ICD-10-CM | POA: Diagnosis not present

## 2015-08-21 DIAGNOSIS — Z1389 Encounter for screening for other disorder: Secondary | ICD-10-CM | POA: Diagnosis not present

## 2015-08-21 DIAGNOSIS — E782 Mixed hyperlipidemia: Secondary | ICD-10-CM | POA: Diagnosis not present

## 2015-08-21 DIAGNOSIS — R002 Palpitations: Secondary | ICD-10-CM | POA: Diagnosis not present

## 2015-09-16 ENCOUNTER — Ambulatory Visit (INDEPENDENT_AMBULATORY_CARE_PROVIDER_SITE_OTHER): Payer: Commercial Managed Care - HMO | Admitting: Cardiovascular Disease

## 2015-09-16 ENCOUNTER — Encounter: Payer: Self-pay | Admitting: Cardiovascular Disease

## 2015-09-16 VITALS — BP 166/100 | HR 77 | Ht 70.0 in | Wt 228.0 lb

## 2015-09-16 DIAGNOSIS — I10 Essential (primary) hypertension: Secondary | ICD-10-CM

## 2015-09-16 DIAGNOSIS — R011 Cardiac murmur, unspecified: Secondary | ICD-10-CM

## 2015-09-16 DIAGNOSIS — R002 Palpitations: Secondary | ICD-10-CM

## 2015-09-16 NOTE — Progress Notes (Signed)
Patient ID: [name], female   DOB: [birth year], 65 y.o.   MRN: [id number]       CARDIOLOGY CONSULT NOTE  Patient ID: [name] MRN: [id number] DOB/AGE: [birth year] 65 y.o.  Admit date: (Not on file) Primary Physician: Purvis Kilts, MD Referring Physician: [name] MD  Reason for Consultation: Palpitations, murmur  HPI: The patient is a 65 year old woman referred for the evaluation of palpitations and a cardiac murmur.   Past medical history is significant for hypertension.   ECG performed on 08/21/15 which I personally interpreted demonstrated sinus rhythm with no ischemic ST segment or T-wave abnormalities, nor any arrhythmias.   Review of labs performed on 08/21/15 showed hemoglobin A1c 5.7%, hemoglobin 14.2, platelets 271, BUN 25, creatinine 1.25, sodium 141, potassium 4.5, calcium 9.6, total cholesterol 216, triglycerides 117, HDL 55, LDL 138, TSH 1.04.  She has been experiencing palpitations for the past 10 years. She said they are occasional and can occur at any time while she is resting. They are more prominent when she lies down on her left side in bed. She experiences them as an "extra beat sensation".   She was diagnosed with a cardiac murmur several years ago. She has not undergone echocardiography in the past.   She walks approximately a quarter mile walking her dogs and also does gardening and housekeeping and her energy levels have remained stable through the years. Along with her palpitations, she occasionally has light dizziness. She denies associated chest pain, shortness of breath, leg swelling, orthopnea, paroxysmal nocturnal dyspnea, and syncope.  She said blood pressures normally run in the 120s over 90s and thinks she may be a little anxious today.    No Known Allergies  Current Outpatient Prescriptions  Medication Sig Dispense Refill  . olmesartan-hydrochlorothiazide (BENICAR HCT) 20-12.5 MG tablet Take 1 tablet by mouth daily.    Marland Kitchen omeprazole  (PRILOSEC) 40 MG capsule Take 40 mg by mouth daily.  3  . Wheat Dextrin (BENEFIBER DRINK MIX) PACK Take 4 g by mouth at bedtime.    Marland Kitchen zolpidem (AMBIEN) 10 MG tablet Take 5 mg by mouth at bedtime.  2   No current facility-administered medications for this visit.    Past Medical History  Diagnosis Date  . Hypertension   . Heart murmur     Past Surgical History  Procedure Laterality Date  . Tubal ligation    . Tonsillectomy    . Rotator cuff repair Right 2012  . Colonoscopy N/A 08/01/2014    Procedure: COLONOSCOPY;  Surgeon: Rogene Houston, MD;  [facility];  Service: Endoscopy;  Laterality: N/A;  900 -- moved to 10:00 - Ann notified pt    Social History   Social History  . Marital Status: Married    Spouse Name: N/A  . Number of Children: N/A  . Years of Education: N/A   Occupational History  . Not on file.   Social History Main Topics  . Smoking status: Former Smoker -- 0.10 packs/day    Types: Cigarettes    Start date: 09/16/1962    Quit date: 09/15/1969  . Smokeless tobacco: Never Used  . Alcohol Use: 0.0 oz/week    0 Standard drinks or equivalent per week     Comment: occationally  . Drug Use: No  . Sexual Activity: Not on file   Other Topics Concern  . Not on file   Social History Narrative     No family history of premature CAD in 1st  degree relatives.  Prior to Admission medications   Medication Sig Start Date End Date Taking? Authorizing Provider  losartan-hydrochlorothiazide (HYZAAR) 100-25 MG per tablet Take 1 tablet by mouth daily. 05/15/14   Historical Provider, MD  omeprazole (PRILOSEC) 40 MG capsule Take 40 mg by mouth daily. 04/18/14   Historical Provider, MD  Wheat Dextrin (BENEFIBER DRINK MIX) PACK Take 4 g by mouth at bedtime. 08/01/14   Rogene Houston, MD  zolpidem (AMBIEN) 10 MG tablet Take 5 mg by mouth at bedtime. 06/12/14   Historical Provider, MD     Review of systems complete and found to be negative unless listed above in  HPI     Physical exam Blood pressure 166/100, pulse 77, height 5\' 10"  (1.778 m), weight 228 lb (103.42 kg), SpO2 95 %. General: NAD Neck: No JVD, no thyromegaly or thyroid nodule.  Lungs: Clear to auscultation bilaterally with normal respiratory effort. CV: Nondisplaced PMI. Regular rate and rhythm, normal S1/S2, no [id number], 2/6 pansystolic murmur heard throughout the precordium.  No peripheral edema.  No carotid bruit.  Normal pedal pulses.  Abdomen: Soft, nontender, obese.  Skin: Intact without lesions or rashes.  Neurologic: Alert and oriented x 3.  Psych: Normal affect. Extremities: No clubbing or cyanosis.  HEENT: Normal.   ECG: Most recent ECG reviewed.  Labs:   Lab Results  Component Value Date   WBC 15.4* 02/12/2011   HGB 12.9 02/12/2011   HCT 37.6 02/12/2011   MCV 89.7 02/12/2011   PLT 272 02/12/2011   No results for input(s): NA, K, CL, CO2, BUN, CREATININE, CALCIUM, PROT, BILITOT, ALKPHOS, ALT, AST, GLUCOSE in the last 168 hours.  Invalid input(s): LABALBU No results found for: CKTOTAL, CKMB, CKMBINDEX, TROPONINI No results found for: CHOL No results found for: HDL No results found for: LDLCALC No results found for: TRIG No results found for: CHOLHDL No results found for: LDLDIRECT       Studies: No results found.  ASSESSMENT AND PLAN:  1. Palpitations: May be symptomatic PAC's/PVC's. I will obtain a one week event monitor. I will order a 2-D echocardiogram with Doppler to evaluate cardiac structure, function, and regional wall motion.  2. Essential HTN: Markedly elevated but reportedly normal on other days. Will monitor.  3. Cardiac murmur: I will order a 2-D echocardiogram with Doppler to evaluate cardiac structure, function, and regional wall motion.   Dispo: fu 6 weeks.   Signed: Kate Sable, M.D., F.A.C.C.  09/16/2015, 1:29 PM

## 2015-09-16 NOTE — Patient Instructions (Signed)
Your physician recommends that you schedule a follow-up appointment in:  6 weeks   Your physician recommends that you continue on your current medications as directed. Please refer to the Current Medication list given to you today.    Your physician has requested that you have an echocardiogram. Echocardiography is a painless test that uses sound waves to create images of your heart. It provides your doctor with information about the size and shape of your heart and how well your heart's chambers and valves are working. This procedure takes approximately one hour. There are no restrictions for this procedure.    Your physician has recommended that you wear an event monitor. Event monitors are medical devices that record the heart's electrical activity. Doctors most often [place] these monitors to diagnose arrhythmias. Arrhythmias are problems with the speed or rhythm of the heartbeat. The monitor is a small, portable device. You can wear one while you do your normal daily activities. This is usually used to diagnose what is causing palpitations/syncope (passing out).     Thank you for choosing [facility] !

## 2015-09-19 ENCOUNTER — Ambulatory Visit (HOSPITAL_COMMUNITY)
Admission: RE | Admit: 2015-09-19 | Discharge: 2015-09-19 | Disposition: A | Discharge status: Home / Self Care | Payer: Commercial Managed Care - HMO | Source: Ambulatory Visit | Attending: Cardiovascular Disease | Admitting: Cardiovascular Disease

## 2015-09-19 DIAGNOSIS — I515 Myocardial degeneration: Secondary | ICD-10-CM | POA: Diagnosis not present

## 2015-09-19 DIAGNOSIS — I340 Nonrheumatic mitral (valve) insufficiency: Secondary | ICD-10-CM | POA: Diagnosis not present

## 2015-09-19 DIAGNOSIS — I071 Rheumatic tricuspid insufficiency: Secondary | ICD-10-CM | POA: Diagnosis not present

## 2015-09-19 DIAGNOSIS — R011 Cardiac murmur, unspecified: Secondary | ICD-10-CM

## 2015-09-19 DIAGNOSIS — I119 Hypertensive heart disease without heart failure: Secondary | ICD-10-CM | POA: Diagnosis not present

## 2015-09-19 DIAGNOSIS — I351 Nonrheumatic aortic (valve) insufficiency: Secondary | ICD-10-CM | POA: Insufficient documentation

## 2015-09-20 ENCOUNTER — Ambulatory Visit (INDEPENDENT_AMBULATORY_CARE_PROVIDER_SITE_OTHER): Payer: Commercial Managed Care - HMO

## 2015-09-20 DIAGNOSIS — R002 Palpitations: Secondary | ICD-10-CM | POA: Diagnosis not present

## 2015-09-23 ENCOUNTER — Telehealth: Payer: Self-pay

## 2015-09-23 NOTE — Telephone Encounter (Signed)
Pt called to give update on medication

## 2015-10-08 ENCOUNTER — Other Ambulatory Visit (HOSPITAL_COMMUNITY): Payer: Self-pay | Admitting: Family Medicine

## 2015-10-08 DIAGNOSIS — Z1231 Encounter for screening mammogram for malignant neoplasm of breast: Secondary | ICD-10-CM

## 2015-10-17 ENCOUNTER — Ambulatory Visit (HOSPITAL_COMMUNITY)
Admission: RE | Admit: 2015-10-17 | Discharge: 2015-10-17 | Disposition: A | Discharge status: Home / Self Care | Payer: Commercial Managed Care - HMO | Source: Ambulatory Visit | Attending: Family Medicine | Admitting: Family Medicine

## 2015-10-17 DIAGNOSIS — Z1231 Encounter for screening mammogram for malignant neoplasm of breast: Secondary | ICD-10-CM | POA: Diagnosis not present

## 2015-10-31 ENCOUNTER — Encounter: Payer: Self-pay | Admitting: Cardiovascular Disease

## 2015-10-31 ENCOUNTER — Ambulatory Visit (INDEPENDENT_AMBULATORY_CARE_PROVIDER_SITE_OTHER): Payer: Commercial Managed Care - HMO | Admitting: Cardiovascular Disease

## 2015-10-31 VITALS — BP 144/80 | HR 81 | Ht 70.0 in | Wt 229.0 lb

## 2015-10-31 DIAGNOSIS — R002 Palpitations: Secondary | ICD-10-CM | POA: Diagnosis not present

## 2015-10-31 DIAGNOSIS — I10 Essential (primary) hypertension: Secondary | ICD-10-CM | POA: Diagnosis not present

## 2015-10-31 DIAGNOSIS — I119 Hypertensive heart disease without heart failure: Secondary | ICD-10-CM | POA: Diagnosis not present

## 2015-10-31 DIAGNOSIS — R011 Cardiac murmur, unspecified: Secondary | ICD-10-CM | POA: Diagnosis not present

## 2015-10-31 MED ORDER — OLMESARTAN MEDOXOMIL 40 MG PO TABS: 40.0000 mg | ORAL_TABLET | Freq: Every day | ORAL | Status: DC

## 2015-10-31 NOTE — Addendum Note (Signed)
Addended by: Debbora Lacrosse R on: 10/31/2015 01:54 PM   Modules accepted: Orders

## 2015-10-31 NOTE — Patient Instructions (Signed)
Medication Instructions:  Your physician recommends that you continue on your current medications as directed. Please refer to the Current Medication list given to you today.   Labwork: NONE  Testing/Procedures: NONE  Follow-Up: Your physician recommends that you schedule a follow-up appointment in: AS NEEDED      Any Other Special Instructions Will Be Listed Below (If Applicable).     If you need a refill on your cardiac medications before your next appointment, please call your pharmacy.   

## 2015-10-31 NOTE — Progress Notes (Signed)
Patient ID: [name], female   DOB: Jul 10, 1950, 65 y.o.   MRN: [id number]      SUBJECTIVE: The patient returns for follow-up after undergoing cardiovascular testing performed for the evaluation of palpitations and murmur. Event monitoring demonstrated sinus rhythm with PVCs. Symptoms correlated with both sinus rhythm as well as PVCs. Echocardiogram demonstrated normal left ventricular systolic function, LVEF [id number], mild LVH with moderate to severe asymmetric septal hypertrophy. There was a mild resting LVOT gradient that increased to a small degree with Valsalva. There was grade 1 diastolic dysfunction with elevated left ventricular filling pressures. There was trivial mitral regurgitation with moderate to severe aortic annular calcification and mild aortic regurgitation. A PFO versus fenestrated septum could not entirely be excluded.  She denies chest pain, shortness of breath, leg swelling, orthopnea, paroxysmal nocturnal dyspnea, and syncope.  Her palpitations have subsided by reducing caffeine intake from 4 cups to 2 cups of coffee daily.   Review of Systems: As per "subjective", otherwise negative.  No Known Allergies  Current Outpatient Prescriptions  Medication Sig Dispense Refill  . olmesartan (BENICAR) 20 MG tablet Take 20 mg by mouth daily.    Marland Kitchen omeprazole (PRILOSEC) 40 MG capsule Take 40 mg by mouth daily.  3  . zolpidem (AMBIEN) 10 MG tablet Take 5 mg by mouth at bedtime.  2  . Wheat Dextrin (BENEFIBER DRINK MIX) PACK Take 4 g by mouth at bedtime. (Patient not taking: Reported on 10/31/2015)     No current facility-administered medications for this visit.    Past Medical History  Diagnosis Date  . Hypertension   . Heart murmur     Past Surgical History  Procedure Laterality Date  . Tubal ligation    . Tonsillectomy    . Rotator cuff repair Right 2012  . Colonoscopy N/A 08/01/2014    Procedure: COLONOSCOPY;  Surgeon: Rogene Houston, MD;  Location: [facility];   Service: Endoscopy;  Laterality: N/A;  900 -- moved to 10:00 - Ann notified pt    Social History   Social History  . Marital Status: Married    Spouse Name: N/A  . Number of Children: N/A  . Years of Education: N/A   Occupational History  . Not on file.   Social History Main Topics  . Smoking status: Former Smoker -- 0.10 packs/day    Types: Cigarettes    Start date: 09/16/1962    Quit date: 09/15/1969  . Smokeless tobacco: Never Used  . Alcohol Use: 0.0 oz/week    0 Standard drinks or equivalent per week     Comment: occationally  . Drug Use: No  . Sexual Activity: Not on file   Other Topics Concern  . Not on file   Social History Narrative     Filed Vitals:   10/31/15 1320  BP: 144/80  Pulse: 81  Height: 5\' 10"  (1.778 m)  Weight: 229 lb (103.874 kg)  SpO2: 96%    PHYSICAL EXAM General: NAD Neck: No JVD, no thyromegaly or thyroid nodule.  Lungs: Clear to auscultation bilaterally with normal respiratory effort. CV: Nondisplaced PMI. Regular rate and rhythm, normal S1/S2, no [id number], 2/6 pansystolic murmur heard throughout the precordium. No peripheral edema. No carotid bruit. Normal pedal pulses.  Abdomen: Soft, nontender, obese.  Skin: Intact without lesions or rashes.  Neurologic: Alert and oriented x 3.  Psych: Normal affect. Extremities: No clubbing or cyanosis.  HEENT: Normal.     ECG: Most recent ECG reviewed.  ASSESSMENT AND PLAN: 1. Palpitations/PVC's: Likely related to symptomatic PVC's. Event monitor reviewed above.Her palpitations have subsided by reducing caffeine intake from 4 cups to 2 cups of coffee daily.  2. Essential HTN: Elevated. Will increase olmesartan to 40 mg daily.  3. Cardiac murmur: Likely due to moderate to severe asymmetric septal hypertrophy with small LVOT diameter. No significant valvular pathology per se. Recommend BP control and adequate hydration.   Dispo: fu prn.   Kate Sable, M.D.,  F.A.C.C.

## 2016-01-13 DIAGNOSIS — M545 Low back pain: Secondary | ICD-10-CM | POA: Diagnosis not present

## 2016-01-13 DIAGNOSIS — M5136 Other intervertebral disc degeneration, lumbar region: Secondary | ICD-10-CM | POA: Diagnosis not present

## 2016-01-13 DIAGNOSIS — D3500 Benign neoplasm of unspecified adrenal gland: Secondary | ICD-10-CM | POA: Diagnosis not present

## 2016-01-13 DIAGNOSIS — E6609 Other obesity due to excess calories: Secondary | ICD-10-CM | POA: Diagnosis not present

## 2016-01-13 DIAGNOSIS — M5410 Radiculopathy, site unspecified: Secondary | ICD-10-CM | POA: Diagnosis not present

## 2016-01-13 DIAGNOSIS — Z6836 Body mass index (BMI) 36.0-36.9, adult: Secondary | ICD-10-CM | POA: Diagnosis not present

## 2016-01-14 ENCOUNTER — Other Ambulatory Visit (HOSPITAL_COMMUNITY): Payer: Self-pay | Admitting: Family Medicine

## 2016-01-14 DIAGNOSIS — M51369 Other intervertebral disc degeneration, lumbar region without mention of lumbar back pain or lower extremity pain: Secondary | ICD-10-CM

## 2016-01-14 DIAGNOSIS — D3500 Benign neoplasm of unspecified adrenal gland: Secondary | ICD-10-CM

## 2016-01-14 DIAGNOSIS — M5136 Other intervertebral disc degeneration, lumbar region: Secondary | ICD-10-CM

## 2016-01-14 DIAGNOSIS — M545 Low back pain: Secondary | ICD-10-CM

## 2016-01-14 DIAGNOSIS — M5410 Radiculopathy, site unspecified: Secondary | ICD-10-CM

## 2016-01-19 ENCOUNTER — Ambulatory Visit (HOSPITAL_COMMUNITY): Payer: Commercial Managed Care - HMO

## 2016-01-28 ENCOUNTER — Ambulatory Visit (HOSPITAL_COMMUNITY)
Admission: RE | Admit: 2016-01-28 | Discharge: 2016-01-28 | Disposition: A | Discharge status: Home / Self Care | Payer: Commercial Managed Care - HMO | Source: Ambulatory Visit | Attending: Family Medicine | Admitting: Family Medicine

## 2016-01-28 DIAGNOSIS — N289 Disorder of kidney and ureter, unspecified: Secondary | ICD-10-CM | POA: Insufficient documentation

## 2016-01-28 DIAGNOSIS — D3500 Benign neoplasm of unspecified adrenal gland: Secondary | ICD-10-CM | POA: Diagnosis not present

## 2016-01-28 DIAGNOSIS — D3502 Benign neoplasm of left adrenal gland: Secondary | ICD-10-CM | POA: Diagnosis not present

## 2016-01-28 DIAGNOSIS — M5136 Other intervertebral disc degeneration, lumbar region: Secondary | ICD-10-CM | POA: Insufficient documentation

## 2016-01-28 DIAGNOSIS — K769 Liver disease, unspecified: Secondary | ICD-10-CM | POA: Diagnosis not present

## 2016-01-28 DIAGNOSIS — D3501 Benign neoplasm of right adrenal gland: Secondary | ICD-10-CM | POA: Diagnosis not present

## 2016-01-28 LAB — POCT I-STAT CREATININE: Creatinine, Ser: 1 mg/dL (ref 0.44–1.00)

## 2016-01-28 MED ORDER — GADOBENATE DIMEGLUMINE 529 MG/ML IV SOLN
20.0000 mL | Freq: Once | INTRAVENOUS | Status: AC | PRN
  Administered 2016-01-28: 20 mL via INTRAVENOUS

## 2016-02-09 ENCOUNTER — Encounter (INDEPENDENT_AMBULATORY_CARE_PROVIDER_SITE_OTHER): Payer: Self-pay | Admitting: Internal Medicine

## 2016-02-16 ENCOUNTER — Encounter (INDEPENDENT_AMBULATORY_CARE_PROVIDER_SITE_OTHER): Payer: Self-pay | Admitting: Internal Medicine

## 2016-02-16 ENCOUNTER — Ambulatory Visit (INDEPENDENT_AMBULATORY_CARE_PROVIDER_SITE_OTHER): Payer: Commercial Managed Care - HMO | Admitting: Internal Medicine

## 2016-02-16 VITALS — BP 124/80 | HR 64 | Temp 98.3°F | Ht 70.0 in | Wt 233.4 lb

## 2016-02-16 DIAGNOSIS — R938 Abnormal findings on diagnostic imaging of other specified body structures: Secondary | ICD-10-CM | POA: Diagnosis not present

## 2016-02-16 DIAGNOSIS — M5430 Sciatica, unspecified side: Secondary | ICD-10-CM | POA: Insufficient documentation

## 2016-02-16 DIAGNOSIS — R9389 Abnormal findings on diagnostic imaging of other specified body structures: Secondary | ICD-10-CM

## 2016-02-16 DIAGNOSIS — R799 Abnormal finding of blood chemistry, unspecified: Secondary | ICD-10-CM | POA: Diagnosis not present

## 2016-02-16 DIAGNOSIS — I10 Essential (primary) hypertension: Secondary | ICD-10-CM | POA: Insufficient documentation

## 2016-02-16 NOTE — Patient Instructions (Signed)
Iron studies.

## 2016-02-16 NOTE — Progress Notes (Signed)
   Subjective:    Patient ID: [name], female    DOB: April 23, 1951, 65 y.o.   MRN: [id number]  HPI Referred by Dr. [name] for abnormal MRI. Possible hemochromatosis . She tells me she is doing okay. She is worried.  Her appetite is good. No weight loss. She has a BM daily or sometimes she has diarrhea. .  No melena or BRRB.   No hx of blood disorders in her family.  01/28/2016 MRI abdomen: adrenal mass; IMPRESSION: 1. Bilateral benign adrenal adenomas. 2. Reduced signal of the hepatic parenchyma on inphase images compared to out of phase images; this can be seen in setting of hemochromatosis. 3. Several tiny hepatic and renal fluid signal intensity lesions favoring small cysts. 4. Lower lumbar degenerative disc disease.   Review of Systems Past Medical History:  Diagnosis Date  . Heart murmur   . Hypertension   . Sciatica     Past Surgical History:  Procedure Laterality Date  . COLONOSCOPY N/A 08/01/2014   Procedure: COLONOSCOPY;  Surgeon: Rogene Houston, MD;  [facility]: [facility];  Service: Endoscopy;  Laterality: N/A;  900 -- moved to 10:00 - Ann notified pt  . ROTATOR CUFF REPAIR Right 2012  . TONSILLECTOMY    . TUBAL LIGATION      Allergies  Allergen Reactions  . Statins     Muscles aches,hurt    Current Outpatient Prescriptions on File Prior to Visit  Medication Sig Dispense Refill  . olmesartan (BENICAR) 40 MG tablet Take 1 tablet (40 mg total) by mouth daily. 90 tablet 3  . omeprazole (PRILOSEC) 40 MG capsule Take 40 mg by mouth daily.  3  . zolpidem (AMBIEN) 10 MG tablet Take 5 mg by mouth at bedtime.  2   No current facility-administered medications on file prior to visit.        Objective:   Physical Exam Blood pressure 124/80, pulse 64, temperature 98.3 F (36.8 C), height 5\' 10"  (1.778 m), weight 233 lb 6.4 oz (105.9 kg).  Alert and oriented. Skin warm and dry. Oral mucosa is moist.   . Sclera anicteric, conjunctivae is pink. Thyroid not  enlarged. No cervical lymphadenopathy. Lungs clear. Heart regular rate and rhythm.  Abdomen is soft. Bowel sounds are positive. No hepatomegaly. No abdominal masses felt. No tenderness.  No edema to lower extremities.        Assessment & Plan:  Abnromal MRI.  Will get ferritin saturationl, ferritin, iron.

## 2016-02-17 ENCOUNTER — Telehealth (INDEPENDENT_AMBULATORY_CARE_PROVIDER_SITE_OTHER): Payer: Self-pay | Admitting: Internal Medicine

## 2016-02-17 DIAGNOSIS — R938 Abnormal findings on diagnostic imaging of other specified body structures: Secondary | ICD-10-CM | POA: Diagnosis not present

## 2016-02-17 DIAGNOSIS — R9389 Abnormal findings on diagnostic imaging of other specified body structures: Secondary | ICD-10-CM

## 2016-02-17 LAB — IRON AND TIBC
%SAT: 61 % — ABNORMAL HIGH (ref 11–50)
Iron: 132 ug/dL (ref 45–160)
TIBC: 218 ug/dL — ABNORMAL LOW (ref 250–450)
UIBC: 86 ug/dL — AB (ref 125–400)

## 2016-02-17 LAB — FERRITIN: Ferritin: 1106 ng/mL — ABNORMAL HIGH (ref 20–288)

## 2016-02-17 NOTE — Telephone Encounter (Signed)
CBC and CMET ordered

## 2016-02-18 LAB — COMPREHENSIVE METABOLIC PANEL
ALK PHOS: 70 U/L (ref 33–130)
ALT: 24 U/L (ref 6–29)
AST: 19 U/L (ref 10–35)
Albumin: 4.1 g/dL (ref 3.6–5.1)
BUN: 19 mg/dL (ref 7–25)
CO2: 25 mmol/L (ref 20–31)
CREATININE: 1.1 mg/dL — AB (ref 0.50–0.99)
Calcium: 9.3 mg/dL (ref 8.6–10.4)
Chloride: 105 mmol/L (ref 98–110)
GLUCOSE: 123 mg/dL — AB (ref 65–99)
Potassium: 4 mmol/L (ref 3.5–5.3)
SODIUM: 140 mmol/L (ref 135–146)
TOTAL PROTEIN: 6.3 g/dL (ref 6.1–8.1)
Total Bilirubin: 0.6 mg/dL (ref 0.2–1.2)

## 2016-02-18 LAB — CBC WITH DIFFERENTIAL/PLATELET
BASOS PCT: 0 %
Basophils Absolute: 0 cells/uL (ref 0–200)
EOS PCT: 3 %
Eosinophils Absolute: 252 cells/uL (ref 15–500)
HCT: 40 % (ref 35.0–45.0)
Hemoglobin: 13.5 g/dL (ref 11.7–15.5)
Lymphocytes Relative: 20 %
Lymphs Abs: 1680 cells/uL (ref 850–3900)
MCH: 30.6 pg (ref 27.0–33.0)
MCHC: 33.8 g/dL (ref 32.0–36.0)
MCV: 90.7 fL (ref 80.0–100.0)
MONOS PCT: 11 %
MPV: 9.8 fL (ref 7.5–12.5)
Monocytes Absolute: 924 cells/uL (ref 200–950)
NEUTROS ABS: 5544 {cells}/uL (ref 1500–7800)
Neutrophils Relative %: 66 %
PLATELETS: 221 10*3/uL (ref 140–400)
RBC: 4.41 MIL/uL (ref 3.80–5.10)
RDW: 13.9 % (ref 11.0–15.0)
WBC: 8.4 10*3/uL (ref 3.8–10.8)

## 2016-02-18 NOTE — Telephone Encounter (Signed)
error 

## 2016-02-19 ENCOUNTER — Telehealth (INDEPENDENT_AMBULATORY_CARE_PROVIDER_SITE_OTHER): Payer: Self-pay | Admitting: Internal Medicine

## 2016-02-19 DIAGNOSIS — R7989 Other specified abnormal findings of blood chemistry: Secondary | ICD-10-CM

## 2016-02-19 NOTE — Telephone Encounter (Signed)
Order for Liver biopsy placed.

## 2016-02-20 ENCOUNTER — Telehealth (INDEPENDENT_AMBULATORY_CARE_PROVIDER_SITE_OTHER): Payer: Self-pay | Admitting: Internal Medicine

## 2016-02-20 NOTE — Telephone Encounter (Signed)
Patient called, stated that she is scheduled for a liver biopsy and has some other questions and would like to speak to Terri.  I did call the patient back and let her know that Karna Christmas was not here today, but would be back on Monday.  Terri, please call the patient back.  [phone]

## 2016-02-23 NOTE — Telephone Encounter (Signed)
I have spoken with patient. I told her she should go ahead and have the liver biopsy. I also directed her to call [facility] for questions concerning the liver biopsy

## 2016-02-27 ENCOUNTER — Encounter (INDEPENDENT_AMBULATORY_CARE_PROVIDER_SITE_OTHER): Payer: Self-pay | Admitting: Internal Medicine

## 2016-02-28 ENCOUNTER — Encounter (INDEPENDENT_AMBULATORY_CARE_PROVIDER_SITE_OTHER): Payer: Self-pay | Admitting: Internal Medicine

## 2016-03-04 ENCOUNTER — Other Ambulatory Visit: Payer: Self-pay | Admitting: Radiology

## 2016-03-05 ENCOUNTER — Encounter (HOSPITAL_COMMUNITY): Payer: Self-pay

## 2016-03-05 ENCOUNTER — Ambulatory Visit (HOSPITAL_COMMUNITY)
Admission: RE | Admit: 2016-03-05 | Discharge: 2016-03-05 | Disposition: A | Discharge status: Home / Self Care | Payer: Commercial Managed Care - HMO | Source: Ambulatory Visit | Attending: Internal Medicine | Admitting: Internal Medicine

## 2016-03-05 DIAGNOSIS — Z888 Allergy status to other drugs, medicaments and biological substances status: Secondary | ICD-10-CM | POA: Insufficient documentation

## 2016-03-05 DIAGNOSIS — I10 Essential (primary) hypertension: Secondary | ICD-10-CM | POA: Diagnosis not present

## 2016-03-05 DIAGNOSIS — Z79899 Other long term (current) drug therapy: Secondary | ICD-10-CM | POA: Diagnosis not present

## 2016-03-05 DIAGNOSIS — Z7951 Long term (current) use of inhaled steroids: Secondary | ICD-10-CM | POA: Insufficient documentation

## 2016-03-05 DIAGNOSIS — Z87891 Personal history of nicotine dependence: Secondary | ICD-10-CM | POA: Diagnosis not present

## 2016-03-05 DIAGNOSIS — R7989 Other specified abnormal findings of blood chemistry: Secondary | ICD-10-CM | POA: Insufficient documentation

## 2016-03-05 DIAGNOSIS — M5430 Sciatica, unspecified side: Secondary | ICD-10-CM | POA: Diagnosis not present

## 2016-03-05 DIAGNOSIS — K760 Fatty (change of) liver, not elsewhere classified: Secondary | ICD-10-CM | POA: Diagnosis not present

## 2016-03-05 LAB — PROTIME-INR
INR: 0.99
Prothrombin Time: 13 seconds (ref 11.4–15.2)

## 2016-03-05 LAB — CBC
HCT: 39.5 % (ref 36.0–46.0)
Hemoglobin: 13.5 g/dL (ref 12.0–15.0)
MCH: 31 pg (ref 26.0–34.0)
MCHC: 34.2 g/dL (ref 30.0–36.0)
MCV: 90.6 fL (ref 78.0–100.0)
PLATELETS: 293 10*3/uL (ref 150–400)
RBC: 4.36 MIL/uL (ref 3.87–5.11)
RDW: 13.7 % (ref 11.5–15.5)
WBC: 10 10*3/uL (ref 4.0–10.5)

## 2016-03-05 LAB — APTT: APTT: 30 s (ref 24–36)

## 2016-03-05 MED ORDER — FENTANYL CITRATE (PF) 100 MCG/2ML IJ SOLN
INTRAMUSCULAR | Status: AC | PRN
  Administered 2016-03-05: 50 ug via INTRAVENOUS
  Administered 2016-03-05: 25 ug via INTRAVENOUS

## 2016-03-05 MED ORDER — LIDOCAINE HCL 1 % IJ SOLN
INTRAMUSCULAR | Status: AC
Start: 2016-03-05 — End: 2016-03-05
  Filled 2016-03-05: qty 20

## 2016-03-05 MED ORDER — SODIUM CHLORIDE 0.9 % IV SOLN: INTRAVENOUS | Status: DC

## 2016-03-05 MED ORDER — SODIUM CHLORIDE 0.9 % IV SOLN
INTRAVENOUS | Status: AC | PRN
  Administered 2016-03-05: 75 mL/h via INTRAVENOUS

## 2016-03-05 MED ORDER — FENTANYL CITRATE (PF) 100 MCG/2ML IJ SOLN
INTRAMUSCULAR | Status: AC
  Filled 2016-03-05: qty 4

## 2016-03-05 MED ORDER — GELATIN ABSORBABLE 12-7 MM EX MISC
CUTANEOUS | Status: AC
  Administered 2016-03-05: 09:00:00
  Filled 2016-03-05: qty 1

## 2016-03-05 MED ORDER — MIDAZOLAM HCL 2 MG/2ML IJ SOLN
INTRAMUSCULAR | Status: AC | PRN
  Administered 2016-03-05 (×2): 1 mg via INTRAVENOUS

## 2016-03-05 MED ORDER — MIDAZOLAM HCL 2 MG/2ML IJ SOLN
INTRAMUSCULAR | Status: AC
  Filled 2016-03-05: qty 4

## 2016-03-05 NOTE — Sedation Documentation (Signed)
O2 d/c'd 

## 2016-03-05 NOTE — Discharge Instructions (Signed)
Liver Biopsy, Care After °Refer to this sheet in the next few weeks. These instructions provide you with information on caring for yourself after your procedure. Your health care provider may also give you more specific instructions. Your treatment has been planned according to current medical practices, but problems sometimes occur. Call your health care provider if you have any problems or questions after your procedure. °WHAT TO EXPECT AFTER THE PROCEDURE °After your procedure, it is typical to have the following: °· A small amount of discomfort in the area where the biopsy was done and in the right shoulder or shoulder blade. °· A small amount of bruising around the area where the biopsy was done and on the skin over the liver. °· Sleepiness and fatigue for the rest of the day. °HOME CARE INSTRUCTIONS  °· Rest at home for 1-2 days or as directed by your health care provider. °· Have a friend or family member stay with you for at least 24 hours. °· Because of the medicines used during the procedure, you should not do the following things in the first 24 hours: °¨ Drive. °¨ Use machinery. °¨ Be responsible for the care of other people. °¨ Sign legal documents. °¨ Take a bath or shower. °· There are many different ways to close and cover an incision, including stitches, skin glue, and adhesive strips. Follow your health care provider's instructions on: °¨ Incision care. °¨ Bandage (dressing) changes and removal. °¨ Incision closure removal. °· Do not drink alcohol in the first week. °· Do not lift more than 5 pounds or play contact sports for 2 weeks after this test. °· Take medicines only as directed by your health care provider. Do not take medicine containing aspirin or non-steroidal anti-inflammatory medicines such as ibuprofen for 1 week after this test. °· It is your responsibility to get your test results. °SEEK MEDICAL CARE IF:  °· You have increased bleeding from an incision that results in more than a  small spot of blood. °· You have redness, swelling, or increasing pain in any incisions. °· You notice a discharge or a bad smell coming from any of your incisions. °· You have a fever or chills. °SEEK IMMEDIATE MEDICAL CARE IF:  °· You develop swelling, bloating, or pain in your abdomen. °· You become dizzy or faint. °· You develop a rash. °· You are nauseous or vomit. °· You have difficulty breathing, feel short of breath, or feel faint. °· You develop chest pain. °· You have problems with your speech or vision. °· You have trouble balancing or moving your arms or legs. °  °This information is not intended to replace advice given to you by your health care provider. Make sure you discuss any questions you have with your health care provider. °  °Document Released: 10/30/2004 Document Revised: 05/03/2014 Document Reviewed: 06/08/2013 °Elsevier Interactive Patient Education ©2016 [employer] ° °

## 2016-03-05 NOTE — Procedures (Signed)
Increased ferritin  US liver bx  No comp Stable EBL <5cc  Full report in PACS PATH PENDING

## 2016-03-05 NOTE — Sedation Documentation (Signed)
Gelfoam inserted in track

## 2016-03-05 NOTE — H&P (Signed)
Chief Complaint: Patient was seen in consultation today for random liver biopsy at the request of [facility]  Referring Physician(s): Setzer,Terri L  Supervising Physician: Daryll Brod  Patient Status: [facility] - Out-pt  History of Present Illness: [name] is a 65 y.o. female   Was seen by MD secondary back pain 12/20/2015 Work up included Lumbar MRI - incidental finding of B adrenal nodules. Led to MRI Abd 01/28/2016: IMPRESSION: 1. Bilateral benign adrenal adenomas. 2. Reduced signal of the hepatic parenchyma on inphase images compared to out of phase images; this can be seen in setting of hemochromatosis. 3. Several tiny hepatic and renal fluid signal intensity lesions favoring small cysts. 4. Lower lumbar degenerative disc disease.  Referred to GI NP Terri Setzer + elevated ferritin level Requesting liver core biopsy Worrisome for hemachromatosis   Past Medical History:  Diagnosis Date  . Heart murmur   . Hypertension   . Sciatica     Past Surgical History:  Procedure Laterality Date  . COLONOSCOPY N/A 08/01/2014   Procedure: COLONOSCOPY;  Surgeon: Rogene Houston, MD;  [facility];  Service: Endoscopy;  Laterality: N/A;  900 -- moved to 10:00 - Ann notified pt  . ROTATOR CUFF REPAIR Right 2012  . TONSILLECTOMY    . TUBAL LIGATION      Allergies: Statins  Medications: Prior to Admission medications   Medication Sig Start Date End Date Taking? Authorizing Provider  dicyclomine (BENTYL) 10 MG capsule Take 10 mg by mouth 3 (three) times daily as needed for spasms.   Yes Historical Provider, MD  fluticasone (FLONASE) 50 MCG/ACT nasal spray Place 1-2 sprays into both nostrils as needed for allergies or rhinitis.    Yes Historical Provider, MD  naproxen sodium (ANAPROX) 220 MG tablet Take 220-440 mg by mouth daily as needed (headaches).   Yes Historical Provider, MD  olmesartan (BENICAR) 40 MG tablet Take 1 tablet (40 mg total) by mouth  daily. 10/31/15  Yes Herminio Commons, MD  omeprazole (PRILOSEC) 40 MG capsule Take 40 mg by mouth daily. 04/18/14  Yes Historical Provider, MD  zolpidem (AMBIEN) 10 MG tablet Take 5 mg by mouth at bedtime. 06/12/14  Yes Historical Provider, MD     History reviewed. No pertinent family history.  Social History   Social History  . Marital status: Married    Spouse name: N/A  . Number of children: N/A  . Years of education: N/A   Social History Main Topics  . Smoking status: Former Smoker    Packs/day: 0.10    Types: Cigarettes    Start date: 09/16/1962    Quit date: 09/15/1969  . Smokeless tobacco: Never Used  . Alcohol use 0.0 oz/week     Comment: occationally  . Drug use: No  . Sexual activity: Not Asked   Other Topics Concern  . None   Social History Narrative  . None     Review of Systems: A 12 point ROS discussed and pertinent positives are indicated in the HPI above.  All other systems are negative.  Review of Systems  Constitutional: Negative for activity change, appetite change, fatigue and fever.  Respiratory: Negative for cough and shortness of breath.   Gastrointestinal: Negative for abdominal pain.  Psychiatric/Behavioral: Negative for behavioral problems and confusion.    Vital Signs: BP (!) 181/92   Pulse 83   Temp 97.9 F (36.6 C) (Oral)   Resp 18   Ht 5\' 10"  (D34-534 m)  Wt 233 lb (105.7 kg)   SpO2 97%   BMI 33.43 kg/m   Physical Exam  Constitutional: She is oriented to person, place, and time. She appears well-nourished.  Cardiovascular: Normal rate and regular rhythm.   Murmur heard. Pulmonary/Chest: Effort normal and breath sounds normal. She has no wheezes.  Abdominal: Soft. Bowel sounds are normal.  Musculoskeletal: Normal range of motion.  Neurological: She is alert and oriented to person, place, and time.  Skin: Skin is warm and dry.  Psychiatric: She has a normal mood and affect. Her behavior is normal. Judgment and thought content  normal.  Nursing note and vitals reviewed.   Mallampati Score:  MD Evaluation Airway: WNL Heart: WNL Abdomen: WNL Chest/ Lungs: WNL ASA  Classification: 2 Mallampati/Airway Score: Two  Imaging: No results found.  Labs:  CBC:  Recent Labs  02/17/16 1534 03/05/16 0640  WBC 8.4 10.0  HGB 13.5 13.5  HCT 40.0 39.5  PLT 221 293    COAGS:  Recent Labs  03/05/16 0640  INR 0.99  APTT 30    BMP:  Recent Labs  01/28/16 1102 02/17/16 1534  NA  --  140  K  --  4.0  CL  --  105  CO2  --  25  GLUCOSE  --  123*  BUN  --  19  CALCIUM  --  9.3  CREATININE 1.00 1.10*    LIVER FUNCTION TESTS:  Recent Labs  02/17/16 1534  BILITOT 0.6  AST 19  ALT 24  ALKPHOS 70  PROT 6.3  ALBUMIN 4.1    TUMOR MARKERS: No results for input(s): AFPTM, CEA, CA199, CHROMGRNA in the last 8760 hours.  Assessment and Plan:  MRI Abd reveals reduced signal of hepatic parenchyma Worrisome for hemachromatosis Noted elevated ferittin per MD Now for random liver biopsy Risks and Benefits discussed with the patient including, but not limited to bleeding, infection, damage to adjacent structures or low yield requiring additional tests. All of the patient's questions were answered, patient is agreeable to proceed. Consent signed and in chart.   Thank you for this interesting consult.  I greatly enjoyed meeting Ithzel A Shockley and look forward to participating in their care.  A copy of this report was sent to the requesting provider on this date.  Electronically Signed: Marshia Tropea A 03/05/2016, 7:15 AM   I spent a total of  30 Minutes   in face to face in clinical consultation, greater than 50% of which was counseling/coordinating care for random liver bx

## 2016-03-05 NOTE — Sedation Documentation (Signed)
Patient is resting comfortably. 

## 2016-03-09 ENCOUNTER — Encounter (INDEPENDENT_AMBULATORY_CARE_PROVIDER_SITE_OTHER): Payer: Self-pay | Admitting: Internal Medicine

## 2016-03-16 ENCOUNTER — Encounter (HOSPITAL_COMMUNITY): Payer: Self-pay

## 2016-03-22 ENCOUNTER — Encounter (INDEPENDENT_AMBULATORY_CARE_PROVIDER_SITE_OTHER): Payer: Self-pay | Admitting: Internal Medicine

## 2016-03-30 ENCOUNTER — Encounter (INDEPENDENT_AMBULATORY_CARE_PROVIDER_SITE_OTHER): Payer: Self-pay | Admitting: Internal Medicine

## 2016-04-05 ENCOUNTER — Encounter (HOSPITAL_COMMUNITY)
Admission: RE | Admit: 2016-04-05 | Discharge: 2016-04-05 | Disposition: A | Discharge status: Home / Self Care | Payer: Commercial Managed Care - HMO | Source: Ambulatory Visit | Attending: Internal Medicine | Admitting: Internal Medicine

## 2016-04-05 NOTE — Progress Notes (Signed)
[name] presents today for phlebotomy per MD orders. HGB/HCT:13.5/39.5 Phlebotomy procedure started at 1241 and ended at 1246. 1 lb 4 oz removed. Patient tolerated procedure well. IV needle removed intact. Coke given to drink. Tolerated well.

## 2016-05-03 ENCOUNTER — Encounter (HOSPITAL_COMMUNITY): Payer: Self-pay

## 2016-05-03 ENCOUNTER — Encounter (HOSPITAL_COMMUNITY)
Admission: RE | Admit: 2016-05-03 | Discharge: 2016-05-03 | Disposition: A | Discharge status: Home / Self Care | Payer: Medicare HMO | Source: Ambulatory Visit | Attending: Internal Medicine | Admitting: Internal Medicine

## 2016-05-03 NOTE — Progress Notes (Signed)
[name] presents today for phlebotomy per MD orders. Phlebotomy procedure started at 1234 and ended at 1240 22 ounces  removed. Patient tolerated procedure well. IV needle removed intact. Next appointment 05/31/2016

## 2016-05-31 ENCOUNTER — Encounter (HOSPITAL_COMMUNITY)
Admission: RE | Admit: 2016-05-31 | Discharge: 2016-05-31 | Disposition: A | Discharge status: Home / Self Care | Payer: Medicare HMO | Source: Ambulatory Visit | Attending: Internal Medicine | Admitting: Internal Medicine

## 2016-05-31 NOTE — Progress Notes (Signed)
[name] presents today for phlebotomy per MD orders. HGB/HCT:13.5/39.5 Phlebotomy procedure started at 1255 and ended at 1301. 1 lb and 3 oz removed. Patient tolerated procedure well. IV needle removed intact. drsg to site. Coke given to drink. Graham crackers and peanut butter given to eat. Tolerated well. Pt states BP is elevated on arrival because she had a difficult and busy morning.

## 2016-06-22 DIAGNOSIS — H524 Presbyopia: Secondary | ICD-10-CM | POA: Diagnosis not present

## 2016-06-22 DIAGNOSIS — Z0100 Encounter for examination of eyes and vision without abnormal findings: Secondary | ICD-10-CM | POA: Diagnosis not present

## 2016-06-28 ENCOUNTER — Encounter (HOSPITAL_COMMUNITY)
Admission: RE | Admit: 2016-06-28 | Discharge: 2016-06-28 | Disposition: A | Discharge status: Home / Self Care | Payer: Medicare HMO | Source: Ambulatory Visit | Attending: Internal Medicine | Admitting: Internal Medicine

## 2016-06-28 LAB — HEMOGLOBIN AND HEMATOCRIT, BLOOD
HCT: 41.1 % (ref 36.0–46.0)
Hemoglobin: 14 g/dL (ref 12.0–15.0)

## 2016-06-28 LAB — IRON: IRON: 152 ug/dL (ref 28–170)

## 2016-06-28 LAB — IRON AND TIBC
IRON: 152 ug/dL (ref 28–170)
Saturation Ratios: 57 % — ABNORMAL HIGH (ref 10.4–31.8)
TIBC: 265 ug/dL (ref 250–450)
UIBC: 113 ug/dL

## 2016-06-28 LAB — FERRITIN: FERRITIN: 330 ng/mL — AB (ref 11–307)

## 2016-06-28 NOTE — Progress Notes (Signed)
[name] presents today for phlebotomy per MD order Phlebotomy procedure started at 1238 and ended at 1246 500 cc removed. Patient tolerated procedure well. IV needle removed intact from left AC with pressure dressing applied with coban.  Coke with saltine crackers given with no complaints.   No signs of distress noted.

## 2016-06-29 NOTE — Progress Notes (Signed)
Results for [name], [name] (MRN [id number]) as of 06/29/2016 08:55  Ref. Range 06/28/2016 12:25 06/28/2016 12:25  Iron Latest Ref Range: 28 - 170 ug/dL 152 152  UIBC Latest Units: ug/dL  113  TIBC Latest Ref Range: 250 - 450 ug/dL  265  Saturation Ratios Latest Ref Range: 10.4 - 31.8 %  57 (H)  Ferritin Latest Ref Range: 11 - 307 ng/mL 330 (H)   Hemoglobin Latest Ref Range: 12.0 - 15.0 g/dL 14.0   HCT Latest Ref Range: 36.0 - 46.0 % 41.1

## 2016-07-08 ENCOUNTER — Encounter (INDEPENDENT_AMBULATORY_CARE_PROVIDER_SITE_OTHER): Payer: Self-pay | Admitting: Internal Medicine

## 2016-07-08 DIAGNOSIS — Z1389 Encounter for screening for other disorder: Secondary | ICD-10-CM | POA: Diagnosis not present

## 2016-07-08 DIAGNOSIS — I10 Essential (primary) hypertension: Secondary | ICD-10-CM | POA: Diagnosis not present

## 2016-07-08 DIAGNOSIS — K219 Gastro-esophageal reflux disease without esophagitis: Secondary | ICD-10-CM | POA: Diagnosis not present

## 2016-07-08 DIAGNOSIS — Z6836 Body mass index (BMI) 36.0-36.9, adult: Secondary | ICD-10-CM | POA: Diagnosis not present

## 2016-07-08 DIAGNOSIS — E782 Mixed hyperlipidemia: Secondary | ICD-10-CM | POA: Diagnosis not present

## 2016-07-08 DIAGNOSIS — G4700 Insomnia, unspecified: Secondary | ICD-10-CM | POA: Diagnosis not present

## 2016-07-12 ENCOUNTER — Telehealth (INDEPENDENT_AMBULATORY_CARE_PROVIDER_SITE_OTHER): Payer: Self-pay | Admitting: Internal Medicine

## 2016-07-12 ENCOUNTER — Other Ambulatory Visit (INDEPENDENT_AMBULATORY_CARE_PROVIDER_SITE_OTHER): Payer: Self-pay | Admitting: *Deleted

## 2016-07-12 DIAGNOSIS — E611 Iron deficiency: Secondary | ICD-10-CM

## 2016-07-12 NOTE — Telephone Encounter (Signed)
CBC in 4 weeks. Make sure to put that I am ordering

## 2016-07-12 NOTE — Telephone Encounter (Signed)
Lab has ben ordered and under the extender , [place] A letter will be sent as a reminder to her.

## 2016-07-19 ENCOUNTER — Other Ambulatory Visit (INDEPENDENT_AMBULATORY_CARE_PROVIDER_SITE_OTHER): Payer: Self-pay | Admitting: *Deleted

## 2016-07-19 ENCOUNTER — Encounter (INDEPENDENT_AMBULATORY_CARE_PROVIDER_SITE_OTHER): Payer: Self-pay | Admitting: *Deleted

## 2016-07-19 DIAGNOSIS — E611 Iron deficiency: Secondary | ICD-10-CM

## 2016-08-03 DIAGNOSIS — R69 Illness, unspecified: Secondary | ICD-10-CM | POA: Diagnosis not present

## 2016-08-11 DIAGNOSIS — E611 Iron deficiency: Secondary | ICD-10-CM | POA: Diagnosis not present

## 2016-08-12 LAB — CBC
HEMATOCRIT: 42 % (ref 35.0–45.0)
HEMOGLOBIN: 13.8 g/dL (ref 11.7–15.5)
MCH: 29.7 pg (ref 27.0–33.0)
MCHC: 32.9 g/dL (ref 32.0–36.0)
MCV: 90.3 fL (ref 80.0–100.0)
MPV: 10 fL (ref 7.5–12.5)
Platelets: 289 10*3/uL (ref 140–400)
RBC: 4.65 MIL/uL (ref 3.80–5.10)
RDW: 13.2 % (ref 11.0–15.0)
WBC: 9.6 10*3/uL (ref 3.8–10.8)

## 2016-08-16 ENCOUNTER — Other Ambulatory Visit (INDEPENDENT_AMBULATORY_CARE_PROVIDER_SITE_OTHER): Payer: Self-pay | Admitting: *Deleted

## 2016-08-16 DIAGNOSIS — E611 Iron deficiency: Secondary | ICD-10-CM

## 2016-10-20 ENCOUNTER — Other Ambulatory Visit (INDEPENDENT_AMBULATORY_CARE_PROVIDER_SITE_OTHER): Payer: Self-pay | Admitting: *Deleted

## 2016-10-20 ENCOUNTER — Encounter (INDEPENDENT_AMBULATORY_CARE_PROVIDER_SITE_OTHER): Payer: Self-pay | Admitting: *Deleted

## 2016-10-20 DIAGNOSIS — E611 Iron deficiency: Secondary | ICD-10-CM

## 2016-11-05 ENCOUNTER — Other Ambulatory Visit: Payer: Self-pay | Admitting: Cardiovascular Disease

## 2016-11-15 DIAGNOSIS — E611 Iron deficiency: Secondary | ICD-10-CM | POA: Diagnosis not present

## 2016-11-15 LAB — CBC
HCT: 41.2 % (ref 35.0–45.0)
Hemoglobin: 13.7 g/dL (ref 11.7–15.5)
MCH: 29.9 pg (ref 27.0–33.0)
MCHC: 33.3 g/dL (ref 32.0–36.0)
MCV: 90 fL (ref 80.0–100.0)
MPV: 9.9 fL (ref 7.5–12.5)
PLATELETS: 281 10*3/uL (ref 140–400)
RBC: 4.58 MIL/uL (ref 3.80–5.10)
RDW: 14 % (ref 11.0–15.0)
WBC: 9.2 10*3/uL (ref 3.8–10.8)

## 2016-11-16 LAB — FERRITIN: FERRITIN: 474 ng/mL — AB (ref 20–288)

## 2016-11-24 ENCOUNTER — Other Ambulatory Visit (INDEPENDENT_AMBULATORY_CARE_PROVIDER_SITE_OTHER): Payer: Self-pay | Admitting: *Deleted

## 2016-11-24 DIAGNOSIS — E611 Iron deficiency: Secondary | ICD-10-CM

## 2016-12-06 ENCOUNTER — Other Ambulatory Visit: Payer: Self-pay | Admitting: Cardiovascular Disease

## 2016-12-10 ENCOUNTER — Encounter (INDEPENDENT_AMBULATORY_CARE_PROVIDER_SITE_OTHER): Payer: Self-pay | Admitting: *Deleted

## 2016-12-10 ENCOUNTER — Other Ambulatory Visit (INDEPENDENT_AMBULATORY_CARE_PROVIDER_SITE_OTHER): Payer: Self-pay | Admitting: *Deleted

## 2016-12-10 DIAGNOSIS — E611 Iron deficiency: Secondary | ICD-10-CM

## 2017-01-18 DIAGNOSIS — E6609 Other obesity due to excess calories: Secondary | ICD-10-CM | POA: Diagnosis not present

## 2017-01-18 DIAGNOSIS — R7309 Other abnormal glucose: Secondary | ICD-10-CM | POA: Diagnosis not present

## 2017-01-18 DIAGNOSIS — Z0001 Encounter for general adult medical examination with abnormal findings: Secondary | ICD-10-CM | POA: Diagnosis not present

## 2017-01-18 DIAGNOSIS — E782 Mixed hyperlipidemia: Secondary | ICD-10-CM | POA: Diagnosis not present

## 2017-01-18 DIAGNOSIS — D239 Other benign neoplasm of skin, unspecified: Secondary | ICD-10-CM | POA: Diagnosis not present

## 2017-01-18 DIAGNOSIS — Z23 Encounter for immunization: Secondary | ICD-10-CM | POA: Diagnosis not present

## 2017-01-18 DIAGNOSIS — Z1389 Encounter for screening for other disorder: Secondary | ICD-10-CM | POA: Diagnosis not present

## 2017-01-18 DIAGNOSIS — Z6835 Body mass index (BMI) 35.0-35.9, adult: Secondary | ICD-10-CM | POA: Diagnosis not present

## 2017-01-18 DIAGNOSIS — R011 Cardiac murmur, unspecified: Secondary | ICD-10-CM | POA: Diagnosis not present

## 2017-01-18 DIAGNOSIS — K589 Irritable bowel syndrome without diarrhea: Secondary | ICD-10-CM | POA: Diagnosis not present

## 2017-01-18 DIAGNOSIS — D3500 Benign neoplasm of unspecified adrenal gland: Secondary | ICD-10-CM | POA: Diagnosis not present

## 2017-01-18 DIAGNOSIS — I10 Essential (primary) hypertension: Secondary | ICD-10-CM | POA: Diagnosis not present

## 2017-01-19 DIAGNOSIS — E611 Iron deficiency: Secondary | ICD-10-CM | POA: Diagnosis not present

## 2017-01-20 LAB — HEMOGLOBIN AND HEMATOCRIT, BLOOD
HCT: 41 % (ref 35.0–45.0)
HEMOGLOBIN: 14 g/dL (ref 11.7–15.5)

## 2017-01-20 LAB — FERRITIN: Ferritin: 385 ng/mL — ABNORMAL HIGH (ref 20–288)

## 2017-01-25 ENCOUNTER — Other Ambulatory Visit (INDEPENDENT_AMBULATORY_CARE_PROVIDER_SITE_OTHER): Payer: Self-pay | Admitting: *Deleted

## 2017-01-25 DIAGNOSIS — E611 Iron deficiency: Secondary | ICD-10-CM

## 2017-01-26 DIAGNOSIS — E782 Mixed hyperlipidemia: Secondary | ICD-10-CM | POA: Diagnosis not present

## 2017-01-26 DIAGNOSIS — Z0001 Encounter for general adult medical examination with abnormal findings: Secondary | ICD-10-CM | POA: Diagnosis not present

## 2017-01-26 DIAGNOSIS — Z23 Encounter for immunization: Secondary | ICD-10-CM | POA: Diagnosis not present

## 2017-01-26 DIAGNOSIS — Z1389 Encounter for screening for other disorder: Secondary | ICD-10-CM | POA: Diagnosis not present

## 2017-01-26 DIAGNOSIS — Z139 Encounter for screening, unspecified: Secondary | ICD-10-CM | POA: Diagnosis not present

## 2017-04-22 ENCOUNTER — Other Ambulatory Visit (INDEPENDENT_AMBULATORY_CARE_PROVIDER_SITE_OTHER): Payer: Self-pay | Admitting: *Deleted

## 2017-04-22 ENCOUNTER — Encounter (INDEPENDENT_AMBULATORY_CARE_PROVIDER_SITE_OTHER): Payer: Self-pay | Admitting: *Deleted

## 2017-04-22 DIAGNOSIS — E611 Iron deficiency: Secondary | ICD-10-CM

## 2017-05-11 DIAGNOSIS — E782 Mixed hyperlipidemia: Secondary | ICD-10-CM | POA: Diagnosis not present

## 2017-05-11 DIAGNOSIS — Z1389 Encounter for screening for other disorder: Secondary | ICD-10-CM | POA: Diagnosis not present

## 2017-05-11 DIAGNOSIS — N182 Chronic kidney disease, stage 2 (mild): Secondary | ICD-10-CM | POA: Diagnosis not present

## 2017-05-11 DIAGNOSIS — Z6836 Body mass index (BMI) 36.0-36.9, adult: Secondary | ICD-10-CM | POA: Diagnosis not present

## 2017-05-11 DIAGNOSIS — R7309 Other abnormal glucose: Secondary | ICD-10-CM | POA: Diagnosis not present

## 2017-05-11 DIAGNOSIS — E6609 Other obesity due to excess calories: Secondary | ICD-10-CM | POA: Diagnosis not present

## 2017-05-11 DIAGNOSIS — I10 Essential (primary) hypertension: Secondary | ICD-10-CM | POA: Diagnosis not present

## 2017-06-09 DIAGNOSIS — Z0100 Encounter for examination of eyes and vision without abnormal findings: Secondary | ICD-10-CM | POA: Diagnosis not present

## 2017-06-09 DIAGNOSIS — H524 Presbyopia: Secondary | ICD-10-CM | POA: Diagnosis not present

## 2017-07-04 DIAGNOSIS — I110 Hypertensive heart disease with heart failure: Secondary | ICD-10-CM | POA: Diagnosis not present

## 2017-07-04 DIAGNOSIS — Z6836 Body mass index (BMI) 36.0-36.9, adult: Secondary | ICD-10-CM | POA: Diagnosis not present

## 2017-07-04 DIAGNOSIS — E782 Mixed hyperlipidemia: Secondary | ICD-10-CM | POA: Diagnosis not present

## 2017-07-04 DIAGNOSIS — R7309 Other abnormal glucose: Secondary | ICD-10-CM | POA: Diagnosis not present

## 2017-07-04 DIAGNOSIS — E6609 Other obesity due to excess calories: Secondary | ICD-10-CM | POA: Diagnosis not present

## 2017-07-28 ENCOUNTER — Encounter: Payer: Self-pay | Admitting: Obstetrics & Gynecology

## 2017-07-28 ENCOUNTER — Ambulatory Visit: Payer: Medicare HMO | Admitting: Obstetrics & Gynecology

## 2017-07-28 VITALS — BP 138/80 | HR 82 | Ht 69.0 in | Wt 230.0 lb

## 2017-07-28 DIAGNOSIS — Z4689 Encounter for fitting and adjustment of other specified devices: Secondary | ICD-10-CM

## 2017-07-28 DIAGNOSIS — N813 Complete uterovaginal prolapse: Secondary | ICD-10-CM

## 2017-07-28 DIAGNOSIS — N814 Uterovaginal prolapse, unspecified: Secondary | ICD-10-CM

## 2017-07-28 DIAGNOSIS — N8110 Cystocele, unspecified: Secondary | ICD-10-CM

## 2017-07-28 NOTE — Progress Notes (Signed)
Chief Complaint  Patient presents with  . bulge in vaginal area      67 y.o. [id number] Patient's last menstrual period was 04/26/1998 (within years). The current method of family planning is post menopausal status.  Outpatient Encounter Medications as of 07/28/2017  Medication Sig  . diltiazem (TIAZAC) 180 MG 24 hr capsule Take 180 mg by mouth daily.  . fluticasone (FLONASE) 50 MCG/ACT nasal spray Place 1-2 sprays into both nostrils as needed for allergies or rhinitis.   . naproxen sodium (ANAPROX) 220 MG tablet Take 220-440 mg by mouth daily as needed (headaches).  Marland Kitchen omeprazole (PRILOSEC) 20 MG capsule Take 20 mg by mouth daily.   Marland Kitchen zolpidem (AMBIEN) 10 MG tablet Take 5 mg by mouth at bedtime.  . [DISCONTINUED] olmesartan (BENICAR) 40 MG tablet TAKE 1 TABLET BY MOUTH EVERY DAY (Patient not taking: Reported on 08/08/2017)  . [DISCONTINUED] dicyclomine (BENTYL) 10 MG capsule Take 10 mg by mouth 3 (three) times daily as needed for spasms.   No facility-administered encounter medications on file as of 07/28/2017.     Subjective [name]  Comes in with increasing symptoms of vaginal pressure feelings that there is a bulg in her vaginae even being able to see at times Causes a feeling of pulling or pressure occasionally low back discomfort She is had no bleeding She denies any vaginal discharge or odor No consistent urine leakage Certainly lifting makes it worse No other associated issues are noted Past Medical History:  Diagnosis Date  . Hay fever   . Heart murmur   . Hemochromatosis   . Hypertension   . Migraines   . Sciatica     Past Surgical History:  Procedure Laterality Date  . CARPAL TUNNEL RELEASE Right   . COLONOSCOPY N/A 08/01/2014   Procedure: COLONOSCOPY;  Surgeon: Rogene Houston, MD;  [facility];  Service: Endoscopy;  Laterality: N/A;  900 -- moved to 10:00 - Ann notified pt  . ELBOW SURGERY    . ROTATOR CUFF REPAIR Right 2012  .  TONSILLECTOMY    . TUBAL LIGATION      OB History    Gravida  4   Para  4   Term  0   Preterm  4   AB      Living  2     SAB      TAB      Ectopic      Multiple      Live Births  2           Allergies  Allergen Reactions  . Statins     Muscles aches,hurt    Social History   Socioeconomic History  . Marital status: Married    Spouse name: Not on file  . Number of children: Not on file  . Years of education: Not on file  . Highest education level: Not on file  Occupational History  . Not on file  Social Needs  . Financial resource strain: Not on file  . Food insecurity:    Worry: Not on file    Inability: Not on file  . Transportation needs:    Medical: Not on file    Non-medical: Not on file  Tobacco Use  . Smoking status: Former Smoker    Packs/day: 0.10    Types: Cigarettes    Start date: 09/16/1962    Last attempt to quit: 09/15/1969    Years since quitting: 48.0  .  Smokeless tobacco: Never Used  . Tobacco comment: as a teenager  Substance and Sexual Activity  . Alcohol use: Yes    Comment:  1-2 month  . Drug use: No  . Sexual activity: Not Currently    Partners: Female    Birth control/protection: Post-menopausal    Comment: BTL  Lifestyle  . Physical activity:    Days per week: Not on file    Minutes per session: Not on file  . Stress: Not on file  Relationships  . Social connections:    Talks on phone: Not on file    Gets together: Not on file    Attends religious service: Not on file    Active member of club or organization: Not on file    Attends meetings of clubs or organizations: Not on file    Relationship status: Not on file  Other Topics Concern  . Not on file  Social History Narrative  . Not on file    Family History  Problem Relation Age of Onset  . Heart attack Paternal Grandfather   . Migraines Maternal Grandmother   . Heart attack Maternal Grandfather   . Heart attack Father   . Heart murmur Father   .  Cancer Mother        pancreatic cancer  . Heart murmur Brother   . Breast cancer Maternal Aunt     Medications:       Current Outpatient Medications:  .  diltiazem (TIAZAC) 180 MG 24 hr capsule, Take 180 mg by mouth daily., Disp: , Rfl:  .  fluticasone (FLONASE) 50 MCG/ACT nasal spray, Place 1-2 sprays into both nostrils as needed for allergies or rhinitis. , Disp: , Rfl:  .  naproxen sodium (ANAPROX) 220 MG tablet, Take 220-440 mg by mouth daily as needed (headaches)., Disp: , Rfl:  .  omeprazole (PRILOSEC) 20 MG capsule, Take 20 mg by mouth daily. , Disp: , Rfl: 3 .  zolpidem (AMBIEN) 10 MG tablet, Take 5 mg by mouth at bedtime., Disp: , Rfl: 2 .  nystatin (MYCOSTATIN/NYSTOP) powder, Apply topically 3 (three) times daily. Apply to affected area for up to 7 days, Disp: 30 g, Rfl: 2 .  olmesartan-hydrochlorothiazide (BENICAR HCT) 40-25 MG tablet, , Disp: , Rfl:   Objective Blood pressure 138/80, pulse 82, height 5\' 9"  (1.753 m), weight 230 lb (104.3 kg), last menstrual period 04/26/1998.  General WDWN female NAD Vulva:  normal appearing vulva with no masses, tenderness or lesions Vagina:  Grade III cystocoele and grade II-III uterine prolapse, minimal rectocoele Cervix:  no cervical motion tenderness and no lesions Uterus:  normal size, contour, position, consistency, mobility, non-tender Adnexa: ovaries:present,  normal adnexa in size, nontender and no masses, no masses The patient is fit today for her Milex ring pessary # #6  Pertinent ROS No burning with urination, frequency or urgency No nausea, vomiting or diarrhea Nor fever chills or other constitutional symptoms   Labs or studies none    Impression Diagnoses this Encounter::   ICD-10-CM   1. POP-Q stage 3 cystocele N81.10   2. Uterine prolapse N81.4     Established relevant diagnosis(es):   Plan/Recommendations: No orders of the defined types were placed in this encounter.   Labs or Scans Ordered: No orders  of the defined types were placed in this encounter.   Management:: Milex ring with support #6 for primarily cystocoele grade III No significant rectal prolapse Ordered #5 as well as perfect fit would be 5.5(doesn't  exist BTW) Follow up Return in about 11 days (around 08/08/2017) for Follow up, with Dr Elonda Husky.         All questions were answered.

## 2017-08-08 ENCOUNTER — Ambulatory Visit: Payer: Medicare HMO | Admitting: Obstetrics & Gynecology

## 2017-08-08 ENCOUNTER — Other Ambulatory Visit: Payer: Self-pay

## 2017-08-08 ENCOUNTER — Encounter: Payer: Self-pay | Admitting: Obstetrics & Gynecology

## 2017-08-08 VITALS — BP 112/74 | HR 91 | Ht 69.0 in | Wt 231.0 lb

## 2017-08-08 DIAGNOSIS — N8110 Cystocele, unspecified: Secondary | ICD-10-CM | POA: Diagnosis not present

## 2017-08-08 NOTE — Progress Notes (Signed)
Chief Complaint  Patient presents with  . pessary placement    Blood pressure 112/74, pulse 91, height 5\' 9"  (1.753 m), weight 231 lb (104.8 kg).  [name] presents today for routine follow up related to her pessary.   She was fit for a Milex ring ith support #6 due to Grade III-IV anterior compartment defect She reports no vaginal discharge or vaginal bleeding.  Exam reveals no undue vaginal mucosal pressure of breakdown, no discharge and no vaginal bleeding.  The pessary is placed difficulty.    [name] will be sen back in 1 months for continued follow up.  Florian Buff, MD  08/08/2017 12:15 PM

## 2017-08-17 DIAGNOSIS — E6609 Other obesity due to excess calories: Secondary | ICD-10-CM | POA: Diagnosis not present

## 2017-08-17 DIAGNOSIS — Z1389 Encounter for screening for other disorder: Secondary | ICD-10-CM | POA: Diagnosis not present

## 2017-08-17 DIAGNOSIS — N8110 Cystocele, unspecified: Secondary | ICD-10-CM | POA: Diagnosis not present

## 2017-08-17 DIAGNOSIS — Z6835 Body mass index (BMI) 35.0-35.9, adult: Secondary | ICD-10-CM | POA: Diagnosis not present

## 2017-09-07 ENCOUNTER — Ambulatory Visit: Payer: Medicare HMO | Admitting: Obstetrics and Gynecology

## 2017-09-07 ENCOUNTER — Encounter: Payer: Self-pay | Admitting: Obstetrics and Gynecology

## 2017-09-07 ENCOUNTER — Other Ambulatory Visit: Payer: Self-pay

## 2017-09-07 VITALS — BP 118/62 | HR 68 | Resp 16 | Ht 67.0 in | Wt 231.0 lb

## 2017-09-07 DIAGNOSIS — B372 Candidiasis of skin and nail: Secondary | ICD-10-CM

## 2017-09-07 DIAGNOSIS — R39198 Other difficulties with micturition: Secondary | ICD-10-CM

## 2017-09-07 DIAGNOSIS — N812 Incomplete uterovaginal prolapse: Secondary | ICD-10-CM

## 2017-09-07 LAB — POCT URINALYSIS DIPSTICK
Bilirubin, UA: NEGATIVE
Glucose, UA: NEGATIVE
Ketones, UA: NEGATIVE
NITRITE UA: NEGATIVE
PROTEIN UA: NEGATIVE
Urobilinogen, UA: 0.2 E.U./dL
pH, UA: 5 (ref 5.0–8.0)

## 2017-09-07 MED ORDER — NYSTATIN 100000 UNIT/GM EX POWD: Freq: Three times a day (TID) | CUTANEOUS | 2 refills | Status: DC

## 2017-09-07 NOTE — Progress Notes (Signed)
GYNECOLOGY  VISIT   HPI: 67 y.o.   Married  Caucasian  female   [phone] with Patient's last menstrual period was 04/26/1998 (within years).   here as a new patient referred from Dr. [name] [facility] for prolapse.  Symptoms for several years.  Has fecal urgency and then feels the need to urinate.  Has a bulge for 6 - 8 weeks that goes away after sleeping.   No leakage of urine.  Has some release of urine prior to fully sitting on toilet.  Some minor leakage in the past with a good laugh, but not now.  No leakage wiht exercise or lifting.  Walks the dog.  Feels like her voiding is restricted.  DF - 6 times per day.  NF - none unless dogs get up.  No enuresis.   No hematuria.  Last UTI 6 years ago.  No hx pyelonephritis.  No renal stones.  No prior urology visits.   Some fecal soiling when walking the dog.  More liquid than solid leaking.  This is now better if she avoids tomato sauce.  Not using any fiber products.  No splinting to have BMs.  Was Dr. Elonda Husky and tried a couple of pessaries and they would not stay in.  They discussed hysterectomy briefly.   Patient states she wants it fixed.   Has sciatica on left side following a fall.  States she has difficulty using stirrups due to a "gimpy" left hip.   Has aortic valve calcification, murmur due to moderate to severe asymmetric septal hypertrophy with small LVOT diameter, PVCs. Has SOB with walking the dog and walking up a hill.   Urine Dip: 1+ RBC, 1+ WBC - No dysuria.   GYNECOLOGIC HISTORY: Patient's last menstrual period was 04/26/1998 (within years). Contraception:  Postmenopausal/Tubal ligation Menopausal hormone therapy:  none Last mammogram:  10/17/15 BIRADS 1 negative/density a Last pap smear:   4-5 years ago negative per patient        OB History    Gravida  4   Para  4   Term  0   Preterm  4   AB      Living  2     SAB      TAB      Ectopic      Multiple      Live Births  2              Patient Active Problem List   Diagnosis Date Noted  . Essential hypertension 02/16/2016  . Sciatic leg pain 02/16/2016    Past Medical History:  Diagnosis Date  . Hay fever   . Heart murmur   . Hemochromatosis   . Hypertension   . Migraines   . Sciatica     Past Surgical History:  Procedure Laterality Date  . CARPAL TUNNEL RELEASE Right   . COLONOSCOPY N/A 08/01/2014   Procedure: COLONOSCOPY;  Surgeon: Rogene Houston, MD;  Location: [facility];  Service: Endoscopy;  Laterality: N/A;  900 -- moved to 10:00 - Ann notified pt  . ELBOW SURGERY    . ROTATOR CUFF REPAIR Right 2012  . TONSILLECTOMY    . TUBAL LIGATION      Current Outpatient Medications  Medication Sig Dispense Refill  . diltiazem (TIAZAC) 180 MG 24 hr capsule Take 180 mg by mouth daily.    . fluticasone (FLONASE) 50 MCG/ACT nasal spray Place 1-2 sprays into both nostrils as needed for allergies or  rhinitis.     . naproxen sodium (ANAPROX) 220 MG tablet Take 220-440 mg by mouth daily as needed (headaches).    . olmesartan-hydrochlorothiazide (BENICAR HCT) 40-25 MG tablet     . omeprazole (PRILOSEC) 20 MG capsule Take 40 mg by mouth daily.  3  . zolpidem (AMBIEN) 10 MG tablet Take 5 mg by mouth at bedtime.  2  . nystatin (MYCOSTATIN/NYSTOP) powder Apply topically 3 (three) times daily. Apply to affected area for up to 7 days 30 g 2   No current facility-administered medications for this visit.      ALLERGIES: Statins  Family History  Problem Relation Age of Onset  . Heart attack Paternal Grandfather   . Migraines Maternal Grandmother   . Heart attack Maternal Grandfather   . Heart attack Father   . Heart murmur Father   . Cancer Mother        pancreatic cancer  . Heart murmur Brother   . Breast cancer Maternal Aunt     Social History   Socioeconomic History  . Marital status: Married    Spouse name: Not on file  . Number of children: Not on file  . Years of education: Not on file   . Highest education level: Not on file  Occupational History  . Not on file  Social Needs  . Financial resource strain: Not on file  . Food insecurity:    Worry: Not on file    Inability: Not on file  . Transportation needs:    Medical: Not on file    Non-medical: Not on file  Tobacco Use  . Smoking status: Former Smoker    Packs/day: 0.10    Types: Cigarettes    Start date: 09/16/1962    Last attempt to quit: 09/15/1969    Years since quitting: 48.0  . Smokeless tobacco: Never Used  . Tobacco comment: as a teenager  Substance and Sexual Activity  . Alcohol use: Yes    Comment:  1-2 month  . Drug use: No  . Sexual activity: Not Currently    Partners: Female    Birth control/protection: Post-menopausal    Comment: BTL  Lifestyle  . Physical activity:    Days per week: Not on file    Minutes per session: Not on file  . Stress: Not on file  Relationships  . Social connections:    Talks on phone: Not on file    Gets together: Not on file    Attends religious service: Not on file    Active member of club or organization: Not on file    Attends meetings of clubs or organizations: Not on file    Relationship status: Not on file  . Intimate partner violence:    Fear of current or ex partner: Not on file    Emotionally abused: Not on file    Physically abused: Not on file    Forced sexual activity: Not on file  Other Topics Concern  . Not on file  Social History Narrative  . Not on file    Review of Systems  Constitutional: Negative.   HENT: Negative.   Eyes: Negative.   Respiratory: Negative.   Cardiovascular: Negative.   Gastrointestinal:       Pressure in rectum  Endocrine: Negative.   Genitourinary: Positive for difficulty urinating.       Pressure in bladder  Musculoskeletal: Negative.   Skin: Negative.   Allergic/Immunologic: Negative.   Neurological: Negative.   Hematological: Negative.  Psychiatric/Behavioral: Negative.     PHYSICAL EXAMINATION:     BP 118/62 (BP Location: Right Arm, Patient Position: Sitting, Cuff Size: Large)   Pulse 68   Resp 16   Ht 5\' 7"  (1.702 m)   Wt 231 lb (104.8 kg)   LMP 04/26/1998 (Within Years)   BMI 36.18 kg/m     General appearance: alert, cooperative and appears stated age Head: Normocephalic, without obvious abnormality, atraumatic Lungs: clear to auscultation bilaterally Heart: regular rate and prominent blowing systolic murmur.  Abdomen:obese, soft, non-tender, no masses,  no organomegaly Extremities: extremities normal, atraumatic, no cyanosis or edema Skin: Skin color, texture, turgor normal. No rashes or lesions.  Patches of erythema of flexural fold of thighs/vulva. Lymph nodes: Cervical, supraclavicular, and axillary nodes normal. No abnormal inguinal nodes palpated Neurologic: Grossly normal  Pelvic: External genitalia:  no lesions              Urethra:  normal appearing urethra with no masses, tenderness or lesions              Bartholins and Skenes: normal                 Vagina: normal appearing vagina with normal color and discharge, no lesions              Cervix: no lesions                Bimanual Exam:  Uterus:  normal size, contour, position, consistency, mobility, non-tender.  Third degree cystocele, second degree uterine prolapse, and first degree rectocele.               Adnexa: no mass, fullness, tenderness              Rectal exam: Yes.  .  Confirms.              Anus:  normal sphincter tone, no lesions  Chaperone was present for exam.  ASSESSMENT  Prominent cardiac murmur with septal hypertrophy and a small diameter LVOT.  Aortic valve calcification.  Obesity.  Incomplete uterovaginal prolapse.  Prior history of urinary incontinence.  Abnormal urine dip.  Difficulty voiding.  Mild fecal soiling.  Candida of flexural folds.  PLAN  I have had a comprehensive discussion with the patient regarding prolapse and urinary incontinence and a discussion of pelvic  anatomy using a 3D model.    I have provided reading materials from [employer] regarding prolapse in general as well as medical and surgical treatment for these conditions.   We talked about observational management also.  Medical treatments may include physical therapy, pessary use.  We discussed surgical care options for hysterectomy combined with a vaginal apical prolapse repair, anterior and posterior colporrhaphy, possible midurethral sling, cystoscopy.  I would do a combined vaginal case with Dr. Matilde Sprang preferably so that she could have a vaginally placed biologic graft.   Return for pelvic US to define anatomy due to exam limited by body habitus.   Urine micro and culture.   Metamucil for fecal soiling.   Nystatin powder bid x 1 week for Candida infection.    An After Visit Summary was printed and given to the patient.  __45____ minutes face to face time of which over 50% was spent in counseling.

## 2017-09-08 LAB — URINALYSIS, MICROSCOPIC ONLY: CASTS: NONE SEEN /LPF

## 2017-09-08 LAB — URINE CULTURE

## 2017-09-22 ENCOUNTER — Other Ambulatory Visit: Payer: Self-pay

## 2017-09-22 ENCOUNTER — Ambulatory Visit (INDEPENDENT_AMBULATORY_CARE_PROVIDER_SITE_OTHER): Payer: Medicare HMO

## 2017-09-22 ENCOUNTER — Ambulatory Visit: Payer: Medicare HMO | Admitting: Obstetrics and Gynecology

## 2017-09-22 ENCOUNTER — Encounter: Payer: Self-pay | Admitting: Obstetrics and Gynecology

## 2017-09-22 VITALS — BP 102/70 | HR 76 | Resp 16 | Ht 67.0 in | Wt 231.0 lb

## 2017-09-22 DIAGNOSIS — N859 Noninflammatory disorder of uterus, unspecified: Secondary | ICD-10-CM | POA: Diagnosis not present

## 2017-09-22 DIAGNOSIS — N812 Incomplete uterovaginal prolapse: Secondary | ICD-10-CM | POA: Diagnosis not present

## 2017-09-22 NOTE — Progress Notes (Signed)
Encounter reviewed by Dr. Jamare Vanatta Amundson C. Silva.  

## 2017-09-22 NOTE — Progress Notes (Signed)
GYNECOLOGY  VISIT   HPI: 67 y.o.   Married  Caucasian  female   [phone] with Patient's last menstrual period was 04/26/1998 (within years).   here for ultrasound.  Has incomplete uterovaginal prolapse and had limited examination on 09/07/17 due to body habitus.  Tried a ring with support pessary and a donut pessary previously. Feels inclined to have her prolapse repaired surgically.   No vaginal bleeding or spotting in menopause.   Not taking any hormonal medication.   GYNECOLOGIC HISTORY: Patient's last menstrual period was 04/26/1998 (within years). Contraception:  Postmenopausal Menopausal hormone therapy:  none Last mammogram:   10/17/15 BIRADS 1 negative/density a Last pap smear:   4-5 years ago negative per patient        OB History    Gravida  4   Para  4   Term  0   Preterm  4   AB      Living  2     SAB      TAB      Ectopic      Multiple      Live Births  2              Patient Active Problem List   Diagnosis Date Noted  . Essential hypertension 02/16/2016  . Sciatic leg pain 02/16/2016    Past Medical History:  Diagnosis Date  . Hay fever   . Heart murmur   . Hemochromatosis   . Hypertension   . Migraines   . Sciatica     Past Surgical History:  Procedure Laterality Date  . CARPAL TUNNEL RELEASE Right   . COLONOSCOPY N/A 08/01/2014   Procedure: COLONOSCOPY;  Surgeon: Rogene Houston, MD;  [facility];  Service: Endoscopy;  Laterality: N/A;  900 -- moved to 10:00 - Ann notified pt  . ELBOW SURGERY    . ROTATOR CUFF REPAIR Right 2012  . TONSILLECTOMY    . TUBAL LIGATION      Current Outpatient Medications  Medication Sig Dispense Refill  . diltiazem (TIAZAC) 180 MG 24 hr capsule Take 180 mg by mouth daily.    . fluticasone (FLONASE) 50 MCG/ACT nasal spray Place 1-2 sprays into both nostrils as needed for allergies or rhinitis.     . naproxen sodium (ANAPROX) 220 MG tablet Take 220-440 mg by mouth daily as needed  (headaches).    . nystatin (MYCOSTATIN/NYSTOP) powder Apply topically 3 (three) times daily. Apply to affected area for up to 7 days 30 g 2  . olmesartan-hydrochlorothiazide (BENICAR HCT) 40-25 MG tablet     . omeprazole (PRILOSEC) 20 MG capsule Take 20 mg by mouth daily.   3  . zolpidem (AMBIEN) 10 MG tablet Take 5 mg by mouth at bedtime.  2   No current facility-administered medications for this visit.      ALLERGIES: Statins  Family History  Problem Relation Age of Onset  . Heart attack Paternal Grandfather   . Migraines Maternal Grandmother   . Heart attack Maternal Grandfather   . Heart attack Father   . Heart murmur Father   . Cancer Mother        pancreatic cancer  . Heart murmur Brother   . Breast cancer Maternal Aunt     Social History   Socioeconomic History  . Marital status: Married    Spouse name: Not on file  . Number of children: Not on file  . Years of education: Not on file  .  Highest education level: Not on file  Occupational History  . Not on file  Social Needs  . Financial resource strain: Not on file  . Food insecurity:    Worry: Not on file    Inability: Not on file  . Transportation needs:    Medical: Not on file    Non-medical: Not on file  Tobacco Use  . Smoking status: Former Smoker    Packs/day: 0.10    Types: Cigarettes    Start date: 09/16/1962    Last attempt to quit: 09/15/1969    Years since quitting: 48.0  . Smokeless tobacco: Never Used  . Tobacco comment: as a teenager  Substance and Sexual Activity  . Alcohol use: Yes    Comment:  1-2 month  . Drug use: No  . Sexual activity: Not Currently    Partners: Female    Birth control/protection: Post-menopausal    Comment: BTL  Lifestyle  . Physical activity:    Days per week: Not on file    Minutes per session: Not on file  . Stress: Not on file  Relationships  . Social connections:    Talks on phone: Not on file    Gets together: Not on file    Attends religious service:  Not on file    Active member of club or organization: Not on file    Attends meetings of clubs or organizations: Not on file    Relationship status: Not on file  . Intimate partner violence:    Fear of current or ex partner: Not on file    Emotionally abused: Not on file    Physically abused: Not on file    Forced sexual activity: Not on file  Other Topics Concern  . Not on file  Social History Narrative  . Not on file    Review of Systems  Constitutional: Negative.   HENT: Negative.   Eyes: Negative.   Respiratory: Negative.   Cardiovascular: Negative.   Gastrointestinal: Negative.   Endocrine: Negative.   Genitourinary: Negative.   Musculoskeletal: Negative.   Skin: Negative.   Allergic/Immunologic: Negative.   Neurological: Negative.   Hematological: Negative.   Psychiatric/Behavioral: Negative.     PHYSICAL EXAMINATION:    BP 102/70 (BP Location: Right Arm, Patient Position: Sitting, Cuff Size: Large)   Pulse 76   Resp 16   Ht 5\' 7"  (1.702 m)   Wt 231 lb (104.8 kg)   LMP 04/26/1998 (Within Years)   BMI 36.18 kg/m     General appearance: alert, cooperative and appears stated age  Pelvic US Uterus with 4 mm calcified fibroid.  EMS 3.1 mm with small sliver of fluid noted.  Ovaries normal. No free fluid.    ASSESSMENT  Small uterine fibroid.  Fluid in endometrial canal and EMS measuring over 3 mm.  Incomplete uterovaginal prolapse. Prominent cardiac murmur with septal hypertrophy and a small diameter LVOT.  Aortic valve calcification.   PLAN  We discussed fibroids and the her endometrial findings and the indication for endometrial biopsy to rule out endometrial pathology.  She will return for this appointment.  We discussed referral to GYN ONC if cancer noted on EMB. She will need referral to cardiology after EMB is back to determine her risk stratification for undergoing surgery for her prolapse.  I would recommend a vaginal surgery if she is cleared  and would refer to Dr. Matilde Sprang for a potential vaginal prolapse repair with graft placement at the time of concurrent hysterectomy. We  discussed a Gelhorn pessary if she is not a candidate for surgery.   An After Visit Summary was printed and given to the patient.  _25_____ minutes face to face time of which over 50% was spent in counseling.

## 2017-10-03 ENCOUNTER — Ambulatory Visit: Payer: Medicare HMO | Admitting: Obstetrics and Gynecology

## 2017-10-03 ENCOUNTER — Other Ambulatory Visit: Payer: Self-pay

## 2017-10-03 ENCOUNTER — Encounter: Payer: Self-pay | Admitting: Obstetrics and Gynecology

## 2017-10-03 VITALS — BP 136/80 | HR 84 | Resp 16 | Ht 67.0 in | Wt 234.0 lb

## 2017-10-03 DIAGNOSIS — N812 Incomplete uterovaginal prolapse: Secondary | ICD-10-CM

## 2017-10-03 DIAGNOSIS — N859 Noninflammatory disorder of uterus, unspecified: Secondary | ICD-10-CM

## 2017-10-03 DIAGNOSIS — N858 Other specified noninflammatory disorders of uterus: Secondary | ICD-10-CM | POA: Diagnosis not present

## 2017-10-03 DIAGNOSIS — R011 Cardiac murmur, unspecified: Secondary | ICD-10-CM

## 2017-10-03 NOTE — Progress Notes (Signed)
GYNECOLOGY  VISIT   HPI: 67 y.o.   Married  Caucasian  female   [phone] with Patient's last menstrual period was 04/26/1998 (within years).   here for EMB.  Fluid noted in endometrial canal at time of ultrasound done for limited pelvic exam.  EMS 3.1 mm with fluid in canal.  No postmenopausal bleeding.  No HRT.   Has desire for prolapse repair.   GYNECOLOGIC HISTORY: Patient's last menstrual period was 04/26/1998 (within years). Contraception:  Postmenopausal Menopausal hormone therapy:  none Last mammogram:  10/17/15 BIRADS 1 negative/density a Last pap smear:   4-5 years ago negative per patient        OB History    Gravida  4   Para  4   Term  0   Preterm  4   AB      Living  2     SAB      TAB      Ectopic      Multiple      Live Births  2              Patient Active Problem List   Diagnosis Date Noted  . Essential hypertension 02/16/2016  . Sciatic leg pain 02/16/2016    Past Medical History:  Diagnosis Date  . Hay fever   . Heart murmur   . Hemochromatosis   . Hypertension   . Migraines   . Sciatica     Past Surgical History:  Procedure Laterality Date  . CARPAL TUNNEL RELEASE Right   . COLONOSCOPY N/A 08/01/2014   Procedure: COLONOSCOPY;  Surgeon: Rogene Houston, MD;  [facility]: [facility];  Service: Endoscopy;  Laterality: N/A;  900 -- moved to 10:00 - Ann notified pt  . ELBOW SURGERY    . ROTATOR CUFF REPAIR Right 2012  . TONSILLECTOMY    . TUBAL LIGATION      Current Outpatient Medications  Medication Sig Dispense Refill  . diltiazem (TIAZAC) 180 MG 24 hr capsule Take 180 mg by mouth daily.    . fluticasone (FLONASE) 50 MCG/ACT nasal spray Place 1-2 sprays into both nostrils as needed for allergies or rhinitis.     . naproxen sodium (ANAPROX) 220 MG tablet Take 220-440 mg by mouth daily as needed (headaches).    . nystatin (MYCOSTATIN/NYSTOP) powder Apply topically 3 (three) times daily. Apply to affected area for up to 7  days 30 g 2  . olmesartan-hydrochlorothiazide (BENICAR HCT) 40-25 MG tablet     . omeprazole (PRILOSEC) 20 MG capsule Take 20 mg by mouth daily.   3  . zolpidem (AMBIEN) 10 MG tablet Take 5 mg by mouth at bedtime.  2   No current facility-administered medications for this visit.      ALLERGIES: Statins  Family History  Problem Relation Age of Onset  . Heart attack Paternal Grandfather   . Migraines Maternal Grandmother   . Heart attack Maternal Grandfather   . Heart attack Father   . Heart murmur Father   . Cancer Mother        pancreatic cancer  . Heart murmur Brother   . Breast cancer Maternal Aunt     Social History   Socioeconomic History  . Marital status: Married    Spouse name: Not on file  . Number of children: Not on file  . Years of education: Not on file  . Highest education level: Not on file  Occupational History  . Not on file  Social Needs  . Financial resource strain: Not on file  . Food insecurity:    Worry: Not on file    Inability: Not on file  . Transportation needs:    Medical: Not on file    Non-medical: Not on file  Tobacco Use  . Smoking status: Former Smoker    Packs/day: 0.10    Types: Cigarettes    Start date: 09/16/1962    Last attempt to quit: 09/15/1969    Years since quitting: 48.0  . Smokeless tobacco: Never Used  . Tobacco comment: as a teenager  Substance and Sexual Activity  . Alcohol use: Yes    Comment:  1-2 month  . Drug use: No  . Sexual activity: Not Currently    Partners: Female    Birth control/protection: Post-menopausal    Comment: BTL  Lifestyle  . Physical activity:    Days per week: Not on file    Minutes per session: Not on file  . Stress: Not on file  Relationships  . Social connections:    Talks on phone: Not on file    Gets together: Not on file    Attends religious service: Not on file    Active member of club or organization: Not on file    Attends meetings of clubs or organizations: Not on file     Relationship status: Not on file  . Intimate partner violence:    Fear of current or ex partner: Not on file    Emotionally abused: Not on file    Physically abused: Not on file    Forced sexual activity: Not on file  Other Topics Concern  . Not on file  Social History Narrative  . Not on file    Review of Systems  Constitutional: Negative.   HENT: Negative.   Eyes: Negative.   Respiratory: Negative.   Cardiovascular: Negative.   Gastrointestinal: Negative.   Endocrine: Negative.   Genitourinary: Negative.   Musculoskeletal: Negative.   Skin: Negative.   Allergic/Immunologic: Negative.   Neurological: Negative.   Hematological: Negative.   Psychiatric/Behavioral: Negative.     PHYSICAL EXAMINATION:    BP 136/80 (BP Location: Right Arm, Patient Position: Sitting, Cuff Size: Large)   Pulse 84   Resp 16   Ht 5\' 7"  (1.702 m)   Wt 234 lb (106.1 kg)   LMP 04/26/1998 (Within Years)   BMI 36.65 kg/m     General appearance: alert, cooperative and appears stated age    Pelvic: External genitalia:  no lesions              Urethra:  normal appearing urethra with no masses, tenderness or lesions              Bartholins and Skenes: normal                 Vagina: normal appearing vagina with normal color and discharge, no lesions              Cervix: no lesions  Third degree cystocele, first degree uterine prolapse, first degree rectocele.  EMB Consent for procedure.  Sterile prep with Hibiclens. Paracervical block with 10 cc 1% lidocaine.  Lot number [id number], exp 01/23. Pipelle passed to almost 9 cm twice.  Tissue to pathology.  Minimal EBL.  No complications.   Chaperone was present for exam.  ASSESSMENT  Fluid in endometrial canal.  Incomplete uterovaginal prolapse.  Prominent cardiac murmur.   PLAN  FU EMB.  Instructions and  precautions given.  Will have her cardiologist do a consultation to see if she is a candidate for surgery.  If she has surgery, I  would recommend a vaginal prolapse repair done in combination with Dr. Matilde Sprang.  She could benefit from a biologic graft.  She may need to have a spinal anesthetic.    An After Visit Summary was printed and given to the patient.  __15____ minutes face to face time of which over 50% was spent in counseling and coordination of care. Marland Kitchen

## 2017-10-03 NOTE — Patient Instructions (Signed)

## 2017-10-05 ENCOUNTER — Telehealth: Payer: Self-pay

## 2017-10-05 NOTE — Telephone Encounter (Signed)
Spoke with patient, advised as seen below per Dr. Quincy Simmonds. Patient states she will wait for EMB results and advise at that time if she would like to return for a different pessary. Patient states she is also on a wait list for earlier cardiologist appt if becomes available. Advised will update K. Sprague, RN, she will return call if any additional info is needed. Patient verbalizes understanding.  Routing to K. Sprague, RN

## 2017-10-05 NOTE — Telephone Encounter (Signed)
-----   Message from Nunzio Cobbs, MD sent at 10/04/2017  1:58 PM EDT ----- Regarding: RE: Cardio August 1st is [facility] for cardiology appointment.  Surgery would be elective.  Patient may choose to return to have a different pessary fitted in the mean time.  I am waiting for her endometrial biopsy to come back, which could potentially change her surgical plan.   Thanks,   Brook ----- Message ----- From: Gwendlyn Deutscher, RN Sent: 10/03/2017  11:18 AM To: Brook Oletta Lamas, MD Subject: Cardio                                         Dr.Silva,  Spoke with [facility]. First available is August 1st. They do not allow the patient to see another MD. Please advise she I know this is for surgical clearance. Patient is aware of her appointment on August 1st and that I will review with you and call her with any changes if they need to be made.  Thank you, Verline Lema

## 2017-10-05 NOTE — Telephone Encounter (Signed)
Left message to call Samiah Ricklefs at [phone]. 

## 2017-10-07 NOTE — Telephone Encounter (Signed)
Spoke with patient. Results given. Patient verbalizes understanding. Does not wish to proceed with new pessary at this time. Would like to think about this and return call. Will await cardiac evaluation for clearance to have surgery. Encounter closed.

## 2017-10-07 NOTE — Telephone Encounter (Signed)
-----   Message from Nunzio Cobbs, MD sent at 10/06/2017  9:15 AM EDT ----- Please report EMB results showing benign atrophy.  No abnormal cells were seen.   I will wait to hear from cardiology about her potential cardiac fitness for surgery.

## 2017-10-10 ENCOUNTER — Telehealth: Payer: Self-pay | Admitting: Obstetrics and Gynecology

## 2017-10-10 NOTE — Telephone Encounter (Signed)
Spoke with patient. Patient states that she has thought about results and would like to proceed with pessary fitting. Appointment scheduled for 10/28/2017 at 10 am with Dr.Silva. Patient is agreeable to date and time.  Routing to provider for final review. Patient agreeable to disposition. Will close encounter.

## 2017-10-10 NOTE — Telephone Encounter (Signed)
Patient has a few questions about her recent results.

## 2017-10-28 ENCOUNTER — Encounter: Payer: Self-pay | Admitting: Obstetrics and Gynecology

## 2017-10-28 ENCOUNTER — Ambulatory Visit (INDEPENDENT_AMBULATORY_CARE_PROVIDER_SITE_OTHER): Payer: Medicare HMO | Admitting: Obstetrics and Gynecology

## 2017-10-28 ENCOUNTER — Other Ambulatory Visit: Payer: Self-pay

## 2017-10-28 VITALS — BP 118/76 | HR 84 | Wt 232.0 lb

## 2017-10-28 DIAGNOSIS — N812 Incomplete uterovaginal prolapse: Secondary | ICD-10-CM | POA: Diagnosis not present

## 2017-10-28 NOTE — Progress Notes (Signed)
GYNECOLOGY  VISIT   HPI: 67 y.o.   Married  Caucasian  female   [phone] with Patient's last menstrual period was 04/26/1998 (within years).   here for  Pessary fitting. She states that she has had a lower back ache for about the last week.   Some urgency.  No dysuria.   Seeing cardiology August 20th.   GYNECOLOGIC HISTORY: Patient's last menstrual period was 04/26/1998 (within years). Contraception:  Postmenopausal Menopausal hormone therapy:  none Last mammogram:  10/17/15 BIRADS 1 negative/density a Last pap smear:   4-5 years ago negative per patient        OB History    Gravida  4   Para  4   Term  0   Preterm  4   AB      Living  2     SAB      TAB      Ectopic      Multiple      Live Births  2              Patient Active Problem List   Diagnosis Date Noted  . Essential hypertension 02/16/2016  . Sciatic leg pain 02/16/2016    Past Medical History:  Diagnosis Date  . Hay fever   . Heart murmur   . Hemochromatosis   . Hypertension   . Migraines   . Sciatica     Past Surgical History:  Procedure Laterality Date  . CARPAL TUNNEL RELEASE Right   . COLONOSCOPY N/A 08/01/2014   Procedure: COLONOSCOPY;  Surgeon: Rogene Houston, MD;  Location: [facility];  Service: Endoscopy;  Laterality: N/A;  900 -- moved to 10:00 - Ann notified pt  . ELBOW SURGERY    . ROTATOR CUFF REPAIR Right 2012  . TONSILLECTOMY    . TUBAL LIGATION      Current Outpatient Medications  Medication Sig Dispense Refill  . diltiazem (TIAZAC) 180 MG 24 hr capsule Take 180 mg by mouth daily.    . fluticasone (FLONASE) 50 MCG/ACT nasal spray Place 1-2 sprays into both nostrils as needed for allergies or rhinitis.     . naproxen sodium (ANAPROX) 220 MG tablet Take 220-440 mg by mouth daily as needed (headaches).    . nystatin (MYCOSTATIN/NYSTOP) powder Apply topically 3 (three) times daily. Apply to affected area for up to 7 days 30 g 2  . olmesartan-hydrochlorothiazide  (BENICAR HCT) 40-25 MG tablet     . omeprazole (PRILOSEC) 20 MG capsule Take 20 mg by mouth daily.   3  . zolpidem (AMBIEN) 10 MG tablet Take 5 mg by mouth at bedtime.  2   No current facility-administered medications for this visit.      ALLERGIES: Statins  Family History  Problem Relation Age of Onset  . Heart attack Paternal Grandfather   . Migraines Maternal Grandmother   . Heart attack Maternal Grandfather   . Heart attack Father   . Heart murmur Father   . Cancer Mother        pancreatic cancer  . Heart murmur Brother   . Breast cancer Maternal Aunt     Social History   Socioeconomic History  . Marital status: Married    Spouse name: Not on file  . Number of children: Not on file  . Years of education: Not on file  . Highest education level: Not on file  Occupational History  . Not on file  Social Needs  . Emergency planning/management officer  strain: Not on file  . Food insecurity:    Worry: Not on file    Inability: Not on file  . Transportation needs:    Medical: Not on file    Non-medical: Not on file  Tobacco Use  . Smoking status: Former Smoker    Packs/day: 0.10    Types: Cigarettes    Start date: 09/16/1962    Last attempt to quit: 09/15/1969    Years since quitting: 48.1  . Smokeless tobacco: Never Used  . Tobacco comment: as a teenager  Substance and Sexual Activity  . Alcohol use: Yes    Comment:  1-2 month  . Drug use: No  . Sexual activity: Not Currently    Partners: Female    Birth control/protection: Post-menopausal    Comment: BTL  Lifestyle  . Physical activity:    Days per week: Not on file    Minutes per session: Not on file  . Stress: Not on file  Relationships  . Social connections:    Talks on phone: Not on file    Gets together: Not on file    Attends religious service: Not on file    Active member of club or organization: Not on file    Attends meetings of clubs or organizations: Not on file    Relationship status: Not on file  . Intimate  partner violence:    Fear of current or ex partner: Not on file    Emotionally abused: Not on file    Physically abused: Not on file    Forced sexual activity: Not on file  Other Topics Concern  . Not on file  Social History Narrative  . Not on file    Review of Systems  HENT: Negative.   Eyes: Negative.   Respiratory: Negative.   Cardiovascular: Negative.   Gastrointestinal: Negative.   Endocrine: Negative.   Genitourinary: Positive for urgency.  Musculoskeletal: Negative.   Skin: Negative.   Allergic/Immunologic: Negative.   Neurological: Negative.   Hematological: Negative.   Psychiatric/Behavioral: Negative.     PHYSICAL EXAMINATION:    BP 118/76   Pulse 84   Wt 232 lb (105.2 kg)   LMP 04/26/1998 (Within Years)   BMI 36.34 kg/m     General appearance: alert, cooperative and appears stated age   Pelvic: External genitalia:  no lesions                       Bimanual Exam:  Uterus:  normal size, contour, position, consistency, mobility, non-tender              Adnexa: no mass, fullness, tenderness            Pessary ring with support #5 and #6 easily expelled with valsalva.  Pessary Gelhorn 76 mm (3 inch) maintained and comfortable.  Does feel the long stem pushing against the rectum.  Did have urinary incontinence with pessary in.  Pessary not expelled with maneuvers.  Chaperone was present for exam.  ASSESSMENT  Incomplete uterovaginal prolapse.  Urinary incontinence.   PLAN  Will order Gelhorn 76 mm with short stem.  Patient understands she may have urinary incontinence with this.    An After Visit Summary was printed and given to the patient.  ___15___ minutes face to face time of which over 50% was spent in counseling.

## 2017-11-09 NOTE — Progress Notes (Signed)
GYNECOLOGY  VISIT   HPI: 67 y.o.   Married  Caucasian  female   [phone] with Patient's last menstrual period was 04/26/1998 (within years).   here for   Pessary insertion.  Concerned about having dx of occult incontinence with the use of the pessary.   Back pain.  Hard to stand, shop, and standing at the sink.   GYNECOLOGIC HISTORY: Patient's last menstrual period was 04/26/1998 (within years). Contraception:  Post menopausal  Menopausal hormone therapy:  none Last mammogram:  10-17-15 density a/BIRADS 1 negative  Last pap smear:   4-5 years ago negative per patient         OB History    Gravida  4   Para  4   Term  0   Preterm  4   AB      Living  2     SAB      TAB      Ectopic      Multiple      Live Births  2              Patient Active Problem List   Diagnosis Date Noted  . Essential hypertension 02/16/2016  . Sciatic leg pain 02/16/2016    Past Medical History:  Diagnosis Date  . Hay fever   . Heart murmur   . Hemochromatosis   . Hypertension   . Migraines   . Sciatica     Past Surgical History:  Procedure Laterality Date  . CARPAL TUNNEL RELEASE Right   . COLONOSCOPY N/A 08/01/2014   Procedure: COLONOSCOPY;  Surgeon: Rogene Houston, MD;  [facility]: [facility];  Service: Endoscopy;  Laterality: N/A;  900 -- moved to 10:00 - Ann notified pt  . ELBOW SURGERY    . ROTATOR CUFF REPAIR Right 2012  . TONSILLECTOMY    . TUBAL LIGATION      Current Outpatient Medications  Medication Sig Dispense Refill  . diltiazem (TIAZAC) 180 MG 24 hr capsule Take 180 mg by mouth daily.    . fluticasone (FLONASE) 50 MCG/ACT nasal spray Place 1-2 sprays into both nostrils as needed for allergies or rhinitis.     . naproxen sodium (ANAPROX) 220 MG tablet Take 220-440 mg by mouth daily as needed (headaches).    . nystatin (MYCOSTATIN/NYSTOP) powder Apply topically 3 (three) times daily. Apply to affected area for up to 7 days 30 g 2  .  olmesartan-hydrochlorothiazide (BENICAR HCT) 40-25 MG tablet     . omeprazole (PRILOSEC) 20 MG capsule Take 20 mg by mouth daily.   3  . zolpidem (AMBIEN) 10 MG tablet Take 5 mg by mouth at bedtime.  2   No current facility-administered medications for this visit.      ALLERGIES: Statins  Family History  Problem Relation Age of Onset  . Heart attack Paternal Grandfather   . Migraines Maternal Grandmother   . Heart attack Maternal Grandfather   . Heart attack Father   . Heart murmur Father   . Cancer Mother        pancreatic cancer  . Heart murmur Brother   . Breast cancer Maternal Aunt     Social History   Socioeconomic History  . Marital status: Married    Spouse name: Not on file  . Number of children: Not on file  . Years of education: Not on file  . Highest education level: Not on file  Occupational History  . Not on file  Social  Needs  . Financial resource strain: Not on file  . Food insecurity:    Worry: Not on file    Inability: Not on file  . Transportation needs:    Medical: Not on file    Non-medical: Not on file  Tobacco Use  . Smoking status: Former Smoker    Packs/day: 0.10    Types: Cigarettes    Start date: 09/16/1962    Last attempt to quit: 09/15/1969    Years since quitting: 48.1  . Smokeless tobacco: Never Used  . Tobacco comment: as a teenager  Substance and Sexual Activity  . Alcohol use: Yes    Comment:  1-2 month  . Drug use: No  . Sexual activity: Not Currently    Partners: Female    Birth control/protection: Post-menopausal    Comment: BTL  Lifestyle  . Physical activity:    Days per week: Not on file    Minutes per session: Not on file  . Stress: Not on file  Relationships  . Social connections:    Talks on phone: Not on file    Gets together: Not on file    Attends religious service: Not on file    Active member of club or organization: Not on file    Attends meetings of clubs or organizations: Not on file    Relationship  status: Not on file  . Intimate partner violence:    Fear of current or ex partner: Not on file    Emotionally abused: Not on file    Physically abused: Not on file    Forced sexual activity: Not on file  Other Topics Concern  . Not on file  Social History Narrative  . Not on file    Review of Systems  Constitutional: Negative.   HENT: Negative.   Eyes: Negative.   Respiratory: Negative.   Cardiovascular: Negative.   Gastrointestinal: Negative.   Endocrine: Negative.   Genitourinary: Negative.   Musculoskeletal: Positive for back pain.  Skin: Negative.   Allergic/Immunologic: Negative.   Neurological: Negative.   Hematological: Negative.   Psychiatric/Behavioral: Negative.     PHYSICAL EXAMINATION:    BP 110/64 (BP Location: Right Arm, Patient Position: Sitting, Cuff Size: Normal)   Pulse 82   Resp 14   Ht 5\' 10"  (1.778 m)   Wt 234 lb (106.1 kg)   LMP 04/26/1998 (Within Years)   BMI 33.58 kg/m     General appearance: alert, cooperative and appears stated age   [facility] pessary - Gelhorn with short stem 76 mm (3 inches) to patient.  Lot number [id number], exp 05/27/2019.   Comfortable for patient.  Does notice some urinary leakage.   Chaperone was present for exam.  ASSESSMENT  Incomplete uterovaginal prolapse.  Back pain.   PLAN  I did discussed trying an incontinence dish prior to opening the gelhorn, but the patient wants to have this pessary because it holds her prolapse so well.  We talked about a toileting schedule and being prepared for incontinence - Depends, protective covers for favorite chairs at home, bed protectors.  She knows that she can call the office, and I will remove her pessary for her if she is struggling with occult incontinence.  We can then try an incontinence dish or use nothing for now.  She has an appointment with cardiology to address her cardiac murmur and fitness if surgery will be done in the future.  FU with PCP regarding  back pain.  Fu here in  7 - 10 days.    An After Visit Summary was printed and given to the patient.  __15___ minutes face to face time of which over 50% was spent in counseling.

## 2017-11-10 ENCOUNTER — Ambulatory Visit (INDEPENDENT_AMBULATORY_CARE_PROVIDER_SITE_OTHER): Payer: Medicare HMO | Admitting: Obstetrics and Gynecology

## 2017-11-10 ENCOUNTER — Encounter: Payer: Self-pay | Admitting: Obstetrics and Gynecology

## 2017-11-10 ENCOUNTER — Other Ambulatory Visit: Payer: Self-pay

## 2017-11-10 VITALS — BP 110/64 | HR 82 | Resp 14 | Ht 70.0 in | Wt 234.0 lb

## 2017-11-10 DIAGNOSIS — N393 Stress incontinence (female) (male): Secondary | ICD-10-CM

## 2017-11-10 DIAGNOSIS — N812 Incomplete uterovaginal prolapse: Secondary | ICD-10-CM | POA: Insufficient documentation

## 2017-11-10 NOTE — Patient Instructions (Signed)

## 2017-11-18 ENCOUNTER — Ambulatory Visit (INDEPENDENT_AMBULATORY_CARE_PROVIDER_SITE_OTHER): Payer: Medicare HMO | Admitting: Obstetrics and Gynecology

## 2017-11-18 ENCOUNTER — Encounter: Payer: Self-pay | Admitting: Obstetrics and Gynecology

## 2017-11-18 ENCOUNTER — Other Ambulatory Visit (HOSPITAL_COMMUNITY): Payer: Self-pay | Admitting: Family Medicine

## 2017-11-18 VITALS — BP 112/60 | HR 68 | Resp 16 | Ht 70.0 in | Wt 234.0 lb

## 2017-11-18 DIAGNOSIS — N812 Incomplete uterovaginal prolapse: Secondary | ICD-10-CM

## 2017-11-18 DIAGNOSIS — Z1231 Encounter for screening mammogram for malignant neoplasm of breast: Secondary | ICD-10-CM

## 2017-11-18 NOTE — Progress Notes (Signed)
GYNECOLOGY  VISIT   HPI: 67 y.o.   Married  Caucasian  female   [phone] with Patient's last menstrual period was 04/26/1998 (within years).   here for 7-10 day recheck of pessary. Gelhorn placed on 11/10/17.  Had a lot of urinary incontinence the day of the insertion.  Then felt uncomfortable the rest of the next day, but this did resolve. Incontinence is improved every day but does leak with strain, sneeze, cough, or getting out of a chair.  Reports that the leakage is minimal. Good stream when she voids.  Bowel function has improved completely.  States she is happy enough with the pessary.  Happier with the pessary than without.   Asking about Kegel exercises.   GYNECOLOGIC HISTORY: Patient's last menstrual period was 04/26/1998 (within years). Contraception:  Postmenopausal Menopausal hormone therapy:  none Last mammogram:  10-17-15 density a/BIRADS 1 negative  Last pap smear:   4-5 years ago negative per patient         OB History    Gravida  4   Para  4   Term  0   Preterm  4   AB      Living  2     SAB      TAB      Ectopic      Multiple      Live Births  2              Patient Active Problem List   Diagnosis Date Noted  . Incomplete uterovaginal prolapse 11/10/2017  . Essential hypertension 02/16/2016  . Sciatic leg pain 02/16/2016    Past Medical History:  Diagnosis Date  . Hay fever   . Heart murmur   . Hemochromatosis   . Hypertension   . Migraines   . Sciatica     Past Surgical History:  Procedure Laterality Date  . CARPAL TUNNEL RELEASE Right   . COLONOSCOPY N/A 08/01/2014   Procedure: COLONOSCOPY;  Surgeon: Rogene Houston, MD;  Location: [facility];  Service: Endoscopy;  Laterality: N/A;  900 -- moved to 10:00 - Ann notified pt  . ELBOW SURGERY    . ROTATOR CUFF REPAIR Right 2012  . TONSILLECTOMY    . TUBAL LIGATION      Current Outpatient Medications  Medication Sig Dispense Refill  . diltiazem (TIAZAC) 180 MG 24 hr  capsule Take 180 mg by mouth daily.    . fluticasone (FLONASE) 50 MCG/ACT nasal spray Place 1-2 sprays into both nostrils as needed for allergies or rhinitis.     . naproxen sodium (ANAPROX) 220 MG tablet Take 220-440 mg by mouth daily as needed (headaches).    . nystatin (MYCOSTATIN/NYSTOP) powder Apply topically 3 (three) times daily. Apply to affected area for up to 7 days 30 g 2  . olmesartan-hydrochlorothiazide (BENICAR HCT) 40-25 MG tablet     . omeprazole (PRILOSEC) 20 MG capsule Take 20 mg by mouth daily.   3  . zolpidem (AMBIEN) 10 MG tablet Take 5 mg by mouth at bedtime.  2   No current facility-administered medications for this visit.      ALLERGIES: Statins  Family History  Problem Relation Age of Onset  . Heart attack Paternal Grandfather   . Migraines Maternal Grandmother   . Heart attack Maternal Grandfather   . Heart attack Father   . Heart murmur Father   . Cancer Mother        pancreatic cancer  . Heart murmur  Brother   . Breast cancer Maternal Aunt     Social History   Socioeconomic History  . Marital status: Married    Spouse name: Not on file  . Number of children: Not on file  . Years of education: Not on file  . Highest education level: Not on file  Occupational History  . Not on file  Social Needs  . Financial resource strain: Not on file  . Food insecurity:    Worry: Not on file    Inability: Not on file  . Transportation needs:    Medical: Not on file    Non-medical: Not on file  Tobacco Use  . Smoking status: Former Smoker    Packs/day: 0.10    Types: Cigarettes    Start date: 09/16/1962    Last attempt to quit: 09/15/1969    Years since quitting: 48.2  . Smokeless tobacco: Never Used  . Tobacco comment: as a teenager  Substance and Sexual Activity  . Alcohol use: Yes    Comment:  1-2 month  . Drug use: No  . Sexual activity: Not Currently    Partners: Female    Birth control/protection: Post-menopausal    Comment: BTL  Lifestyle   . Physical activity:    Days per week: Not on file    Minutes per session: Not on file  . Stress: Not on file  Relationships  . Social connections:    Talks on phone: Not on file    Gets together: Not on file    Attends religious service: Not on file    Active member of club or organization: Not on file    Attends meetings of clubs or organizations: Not on file    Relationship status: Not on file  . Intimate partner violence:    Fear of current or ex partner: Not on file    Emotionally abused: Not on file    Physically abused: Not on file    Forced sexual activity: Not on file  Other Topics Concern  . Not on file  Social History Narrative  . Not on file    Review of Systems  Genitourinary:       Loss of urine with sneeze or cough  All other systems reviewed and are negative.   PHYSICAL EXAMINATION:    BP 112/60 (BP Location: Right Arm, Patient Position: Sitting, Cuff Size: Large)   Pulse 68   Resp 16   Ht 5\' 10"  (1.778 m)   Wt 234 lb (106.1 kg)   LMP 04/26/1998 (Within Years)   BMI 33.58 kg/m     General appearance: alert, cooperative and appears stated age   Pelvic: External genitalia:  no lesions              Urethra:  normal appearing urethra with no masses, tenderness or lesions              Bartholins and Skenes: normal                 Vagina: normal appearing vagina with normal color and discharge, no lesions              Cervix: no lesions                Bimanual Exam:  Uterus:  normal size, contour, position, consistency, mobility, non-tender              Adnexa: no mass, fullness, tenderness  Gelhorn removed with assistance of ring forceps, cleansed, and replaced.   Chaperone was present for exam.  ASSESSMENT  Incomplete uterovaginal prolapse.  GSI.  Cardiac murmur.  PLAN  She wishes to continue with pessary care.  [place] for [employer]. FU in 3 months, sooner as needed.  She will still complete her cardiology appointment regarding her  murmur and fitness for surgery if desired in the future.    An After Visit Summary was printed and given to the patient.  ____15__ minutes face to face time of which over 50% was spent in counseling.

## 2017-11-18 NOTE — Patient Instructions (Signed)
PESSARY CARE  You may take the pessary out once or twice a week at bedtime and leave it out overnight.   Clean the pessary with soap and water only.   If you develop green foul smelling discharge, leave the pessary out for 2 - 3 days.   Use KY jelly or any other personal lubricant that is water based to help with placement and removal.   Wear a sanitary pad until you are sure you understand if you have urinary incontinence with the pessary in place.   Wear biking type shorts with crotch support for exercise until you know that the pessary will not come out easily with maneuvers causing increases in abdominal pressure.  The pessary needs to be removed for sexual activity.   Look before you flush the toilet to be sure the pessary has not come out with straining on the toilet!  Please call for any pain or vaginal bleeding.    Kegel Exercises Kegel exercises help strengthen the muscles that support the rectum, vagina, small intestine, bladder, and uterus. Doing Kegel exercises can help:  Improve bladder and bowel control.  Improve sexual response.  Reduce problems and discomfort during pregnancy.  Kegel exercises involve squeezing your pelvic floor muscles, which are the same muscles you squeeze when you try to stop the flow of urine. The exercises can be done while sitting, standing, or lying down, but it is best to vary your position. Phase 1 exercises 1. Squeeze your pelvic floor muscles tight. You should feel a tight lift in your rectal area. If you are a female, you should also feel a tightness in your vaginal area. Keep your stomach, buttocks, and legs relaxed. 2. Hold the muscles tight for up to 10 seconds. 3. Relax your muscles. Repeat this exercise 50 times a day or as many times as told by your health care provider. Continue to do this exercise for at least 4-6 weeks or for as long as told by your health care provider. This information is not intended to replace advice given  to you by your health care provider. Make sure you discuss any questions you have with your health care provider. Document Released: 03/29/2012 Document Revised: 12/06/2015 Document Reviewed: 03/02/2015 Elsevier Interactive Patient Education  [employer].

## 2017-11-24 ENCOUNTER — Ambulatory Visit (HOSPITAL_COMMUNITY)
Admission: RE | Admit: 2017-11-24 | Discharge: 2017-11-24 | Disposition: A | Discharge status: Home / Self Care | Payer: Medicare HMO | Source: Ambulatory Visit | Attending: Family Medicine | Admitting: Family Medicine

## 2017-11-24 ENCOUNTER — Ambulatory Visit: Payer: Medicare HMO | Admitting: Cardiovascular Disease

## 2017-11-24 DIAGNOSIS — Z1231 Encounter for screening mammogram for malignant neoplasm of breast: Secondary | ICD-10-CM

## 2017-11-28 ENCOUNTER — Telehealth: Payer: Self-pay | Admitting: Obstetrics and Gynecology

## 2017-11-28 ENCOUNTER — Encounter: Payer: Self-pay | Admitting: Obstetrics and Gynecology

## 2017-11-28 NOTE — Telephone Encounter (Signed)
Detailed message left for patient per DPR on mobile number, letting her know MMG results were in Epic and Dr. Quincy Simmonds has access to review them. Advised patient to return call if any additional questions.   Routing to provider for final review. Patient agreeable to disposition. Will close encounter.

## 2017-11-28 NOTE — Telephone Encounter (Signed)
-----   Message from [place], Generic sent at 11/28/2017 11:17 AM EDT -----    Hi Dr. Quincy Simmonds,    I had a breast exam on 11/24/2017. The reults letter is in my cart. I asked radiology to send you the results also.  You had asked to be informed.     Thanks,  Arbie Cookey

## 2017-12-13 ENCOUNTER — Ambulatory Visit: Payer: Medicare HMO | Admitting: Cardiovascular Disease

## 2017-12-13 ENCOUNTER — Encounter

## 2017-12-13 ENCOUNTER — Encounter: Payer: Self-pay | Admitting: Cardiovascular Disease

## 2017-12-13 VITALS — BP 122/76 | HR 80 | Ht 70.0 in | Wt 234.0 lb

## 2017-12-13 DIAGNOSIS — G4700 Insomnia, unspecified: Secondary | ICD-10-CM | POA: Diagnosis not present

## 2017-12-13 DIAGNOSIS — E6609 Other obesity due to excess calories: Secondary | ICD-10-CM | POA: Diagnosis not present

## 2017-12-13 DIAGNOSIS — I422 Other hypertrophic cardiomyopathy: Secondary | ICD-10-CM

## 2017-12-13 DIAGNOSIS — I10 Essential (primary) hypertension: Secondary | ICD-10-CM

## 2017-12-13 DIAGNOSIS — R7309 Other abnormal glucose: Secondary | ICD-10-CM | POA: Diagnosis not present

## 2017-12-13 DIAGNOSIS — R011 Cardiac murmur, unspecified: Secondary | ICD-10-CM | POA: Diagnosis not present

## 2017-12-13 DIAGNOSIS — M25561 Pain in right knee: Secondary | ICD-10-CM | POA: Diagnosis not present

## 2017-12-13 DIAGNOSIS — E782 Mixed hyperlipidemia: Secondary | ICD-10-CM | POA: Diagnosis not present

## 2017-12-13 DIAGNOSIS — N812 Incomplete uterovaginal prolapse: Secondary | ICD-10-CM

## 2017-12-13 DIAGNOSIS — Z01818 Encounter for other preprocedural examination: Secondary | ICD-10-CM

## 2017-12-13 DIAGNOSIS — M1991 Primary osteoarthritis, unspecified site: Secondary | ICD-10-CM | POA: Diagnosis not present

## 2017-12-13 DIAGNOSIS — Z1389 Encounter for screening for other disorder: Secondary | ICD-10-CM | POA: Diagnosis not present

## 2017-12-13 DIAGNOSIS — I493 Ventricular premature depolarization: Secondary | ICD-10-CM | POA: Diagnosis not present

## 2017-12-13 DIAGNOSIS — Z6836 Body mass index (BMI) 36.0-36.9, adult: Secondary | ICD-10-CM | POA: Diagnosis not present

## 2017-12-13 NOTE — Progress Notes (Signed)
SUBJECTIVE: The patient presents for routine follow-up.  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 There is mitral chordal SAM and relatively small   LVOT diameter. There is a mild resting LVOT gradient that   increases to small degree with Valsalva. Grade 1 diastolic   dysfunction with elevated LV filling pressure. Mild left atrial   enlargement. 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 Moderate to severe aortic annular calcification   with mild aortic regurgitation. Trivial tricuspid regurgitation   with PASP 26 mmHg. Cannot exclude PFO versus fenestrated septum.  Event monitoring in 2017 demonstrated sinus rhythm with PVCs.  [name] has a history of incomplete uterovaginal prolapse.  [name] saw her gynecologist most recently on 11/18/2017 who recommended a cardiac evaluation to evaluate her fitness for surgery if desired in the future.  ECG performed in the office today which I ordered and personally interpreted demonstrates normal sinus rhythm with late R wave transition.  [name] has been doing well since her last visit with me in 2017.  [name] has had no recent hospitalizations.  [name] denies chest pain, worsening of baseline exertional dyspnea, orthopnea, dizziness, and paroxysmal nocturnal dyspnea.  [name] can walk on level ground for at least 1/4 mile without stopping and can climb a flight of stairs without stopping.     Review of Systems: As per "subjective", otherwise negative.  Allergies  Allergen Reactions  . Statins     Muscles aches,hurt    Current Outpatient Medications  Medication Sig Dispense Refill  . diltiazem (TIAZAC) 180 MG 24 hr capsule Take 180 mg by mouth daily.    . fluticasone (FLONASE) 50 MCG/ACT nasal spray Place 1-2 sprays into both nostrils as  needed for allergies or rhinitis.     . naproxen sodium (ANAPROX) 220 MG tablet Take 220-440 mg by mouth daily as needed (headaches).    . nystatin (MYCOSTATIN/NYSTOP) powder Apply topically 3 (three) times daily. Apply to affected area for up to 7 days 30 g 2  . olmesartan-hydrochlorothiazide (BENICAR HCT) 40-25 MG tablet     . omeprazole (PRILOSEC) 20 MG capsule Take 20 mg by mouth daily.   3  . zolpidem (AMBIEN) 10 MG tablet Take 5 mg by mouth at bedtime.  2   No current facility-administered medications for this visit.     Past Medical History:  Diagnosis Date  . Hay fever   . Heart murmur   . Hemochromatosis   . Hypertension   . Migraines   . Sciatica     Past Surgical History:  Procedure Laterality Date  . CARPAL TUNNEL RELEASE Right   . COLONOSCOPY N/A 08/01/2014   Procedure: COLONOSCOPY;  Surgeon: Rogene Houston, MD;  Location: [facility];  Service: Endoscopy;  Laterality: N/A;  900 -- moved to 10:00 - Ann notified pt  . ELBOW SURGERY    . ROTATOR CUFF REPAIR Right 2012  . TONSILLECTOMY    . TUBAL LIGATION      Social History   Socioeconomic History  . Marital status: Married    Spouse name: Not on file  . Number of children: Not on file  . Years of education: Not on file  . Highest education level: Not on file  Occupational History  . Not on file  Social Needs  . Financial  resource strain: Not on file  . Food insecurity:    Worry: Not on file    Inability: Not on file  . Transportation needs:    Medical: Not on file    Non-medical: Not on file  Tobacco Use  . Smoking status: Former Smoker    Packs/day: 0.10    Types: Cigarettes    Start date: 09/16/1962    Last attempt to quit: 09/15/1969    Years since quitting: 48.2  . Smokeless tobacco: Never Used  . Tobacco comment: as a teenager  Substance and Sexual Activity  . Alcohol use: Yes    Comment:  1-2 month  . Drug use: No  . Sexual activity: Not Currently    Partners: Female    Birth  control/protection: Post-menopausal    Comment: BTL  Lifestyle  . Physical activity:    Days per week: Not on file    Minutes per session: Not on file  . Stress: Not on file  Relationships  . Social connections:    Talks on phone: Not on file    Gets together: Not on file    Attends religious service: Not on file    Active member of club or organization: Not on file    Attends meetings of clubs or organizations: Not on file    Relationship status: Not on file  . Intimate partner violence:    Fear of current or ex partner: Not on file    Emotionally abused: Not on file    Physically abused: Not on file    Forced sexual activity: Not on file  Other Topics Concern  . Not on file  Social History Narrative  . Not on file     Vitals:   12/13/17 1421  BP: 122/76  Pulse: 80  SpO2: 98%  Weight: 234 lb (106.1 kg)  Height: _0  (1.778 m)    Wt Readings from Last 3 Encounters:  12/13/17 234 lb (106.1 kg)  11/18/17 234 lb (106.1 kg)  11/10/17 234 lb (106.1 kg)     PHYSICAL EXAM General: NAD HEENT: Normal. Neck: No JVD, no thyromegaly. Lungs: Clear to auscultation bilaterally with normal respiratory effort. CV: Regular rate and rhythm, normal S1/S2, no W2/H8, 3/6 pansystolic murmur heard throughout the precordium. Trace  edema.  No carotid bruit.   Abdomen: Soft, nontender, no distention.  Neurologic: Alert and oriented.  Psych: Normal affect. Skin: Normal. Musculoskeletal: No gross deformities.    ECG: Reviewed above under Subjective   Labs: Lab Results  Component Value Date/Time   K 4.0 02/17/2016 03:34 PM   BUN 19 02/17/2016 03:34 PM   CREATININE 1.10 (H) 02/17/2016 03:34 PM   ALT 24 02/17/2016 03:34 PM   HGB 14.0 01/19/2017 02:06 PM     Lipids: No results found for: LDLCALC, LDLDIRECT, CHOL, TRIG, HDL     ASSESSMENT AND PLAN: 1.  Symptomatic PVCs: Symptomatically stable on long-acting diltiazem.  No changes.  2.  Hypertension: Blood pressure is  controlled.  No changes to therapy.  3.  Cardiac murmur/asymmetric moderate to severe septal hypertrophy: Most recent echocardiogram from 2017 reviewed above.  Symptomatically stable.  No symptoms concerning worsening of baseline exertional dyspnea, orthopnea, or paroxysmal nocturnal dyspnea.  4.  Preoperative risk stratification: [name] can proceed with uterovaginal prolapse surgery with an acceptable level of risk.  [name] does not require noninvasive cardiac studies at this time.  Judicious monitoring of blood pressure in the perioperative period is pertinent and adequate IV fluids to  maintain euvolemia so as not to worsen left ventricular outflow tract gradient.   Disposition: Follow up as needed   Kate Sable, M.D., F.A.C.C.

## 2017-12-13 NOTE — Patient Instructions (Signed)
Your physician recommends that you schedule a follow-up appointment in: as needed    Your physician recommends that you continue on your current medications as directed. Please refer to the Current Medication list given to you today.     No labs or tests today.      Thank you for choosing [facility] !

## 2017-12-19 ENCOUNTER — Ambulatory Visit (INDEPENDENT_AMBULATORY_CARE_PROVIDER_SITE_OTHER): Payer: Medicare HMO | Admitting: Obstetrics and Gynecology

## 2017-12-19 ENCOUNTER — Encounter: Payer: Self-pay | Admitting: Obstetrics and Gynecology

## 2017-12-19 VITALS — BP 130/72 | HR 68 | Resp 16 | Ht 70.0 in | Wt 237.0 lb

## 2017-12-19 DIAGNOSIS — N812 Incomplete uterovaginal prolapse: Secondary | ICD-10-CM

## 2017-12-19 DIAGNOSIS — N393 Stress incontinence (female) (male): Secondary | ICD-10-CM

## 2017-12-19 NOTE — Progress Notes (Signed)
GYNECOLOGY  VISIT   HPI: 67 y.o.   Married  Caucasian  female   [phone] with Patient's last menstrual period was 04/26/1998 (within years).   here for 1 month pessary check    Doing ok with pessary.  Losing urine when she bends over or gets out of a chair.   Had her cardiology visit on 12/13/17.  She was told she is ok to have pelvic organ surgery.  ECHO from 09/19/15 documents several structural cardiac changes.  GYNECOLOGIC HISTORY: Patient's last menstrual period was 04/26/1998 (within years). Contraception:  Postmenopausal  Menopausal hormone therapy:  none Last mammogram:  11/24/17 BIRADS 1 negative/density a Last pap smear:   4-5 years ago negative per patient        OB History    Gravida  4   Para  4   Term  0   Preterm  4   AB      Living  2     SAB      TAB      Ectopic      Multiple      Live Births  2              Patient Active Problem List   Diagnosis Date Noted  . Incomplete uterovaginal prolapse 11/10/2017  . Essential hypertension 02/16/2016  . Sciatic leg pain 02/16/2016    Past Medical History:  Diagnosis Date  . Hay fever   . Heart murmur   . Hemochromatosis   . Hypertension   . Migraines   . Sciatica     Past Surgical History:  Procedure Laterality Date  . CARPAL TUNNEL RELEASE Right   . COLONOSCOPY N/A 08/01/2014   Procedure: COLONOSCOPY;  Surgeon: Rogene Houston, MD;  Location: [facility];  Service: Endoscopy;  Laterality: N/A;  900 -- moved to 10:00 - Ann notified pt  . ELBOW SURGERY    . ROTATOR CUFF REPAIR Right 2012  . TONSILLECTOMY    . TUBAL LIGATION      Current Outpatient Medications  Medication Sig Dispense Refill  . diltiazem (TIAZAC) 180 MG 24 hr capsule Take 180 mg by mouth daily.    . fluticasone (FLONASE) 50 MCG/ACT nasal spray Place 1-2 sprays into both nostrils as needed for allergies or rhinitis.     . naproxen sodium (ANAPROX) 220 MG tablet Take 220-440 mg by mouth daily as needed (headaches).     . nystatin (MYCOSTATIN/NYSTOP) powder Apply topically 3 (three) times daily. Apply to affected area for up to 7 days 30 g 2  . olmesartan-hydrochlorothiazide (BENICAR HCT) 40-25 MG tablet     . omeprazole (PRILOSEC) 20 MG capsule Take 20 mg by mouth daily.   3  . zolpidem (AMBIEN) 10 MG tablet Take 5 mg by mouth at bedtime.  2  . celecoxib (CELEBREX) 200 MG capsule as needed.     No current facility-administered medications for this visit.      ALLERGIES: Statins  Family History  Problem Relation Age of Onset  . Heart attack Paternal Grandfather   . Migraines Maternal Grandmother   . Heart attack Maternal Grandfather   . Heart attack Father   . Heart murmur Father   . Cancer Mother        pancreatic cancer  . Heart murmur Brother   . Breast cancer Maternal Aunt     Social History   Socioeconomic History  . Marital status: Married    Spouse name: Not on file  .  Number of children: Not on file  . Years of education: Not on file  . Highest education level: Not on file  Occupational History  . Not on file  Social Needs  . Financial resource strain: Not on file  . Food insecurity:    Worry: Not on file    Inability: Not on file  . Transportation needs:    Medical: Not on file    Non-medical: Not on file  Tobacco Use  . Smoking status: Former Smoker    Packs/day: 0.10    Types: Cigarettes    Start date: 09/16/1962    Last attempt to quit: 09/15/1969    Years since quitting: 48.2  . Smokeless tobacco: Never Used  . Tobacco comment: as a teenager  Substance and Sexual Activity  . Alcohol use: Yes    Comment:  1-2 month  . Drug use: No  . Sexual activity: Not Currently    Partners: Female    Birth control/protection: Post-menopausal    Comment: BTL  Lifestyle  . Physical activity:    Days per week: Not on file    Minutes per session: Not on file  . Stress: Not on file  Relationships  . Social connections:    Talks on phone: Not on file    Gets together: Not  on file    Attends religious service: Not on file    Active member of club or organization: Not on file    Attends meetings of clubs or organizations: Not on file    Relationship status: Not on file  . Intimate partner violence:    Fear of current or ex partner: Not on file    Emotionally abused: Not on file    Physically abused: Not on file    Forced sexual activity: Not on file  Other Topics Concern  . Not on file  Social History Narrative  . Not on file    Review of Systems  All other systems reviewed and are negative.   PHYSICAL EXAMINATION:    BP 130/72 (BP Location: Right Arm, Patient Position: Sitting, Cuff Size: Large)   Pulse 68   Resp 16   Ht 5\' 10"  (1.778 m)   Wt 237 lb (107.5 kg)   LMP 04/26/1998 (Within Years)   BMI 34.01 kg/m     General appearance: alert, cooperative and appears stated age    Pelvic: External genitalia:  no lesions              Urethra:  normal appearing urethra with no masses, tenderness or lesions              Bartholins and Skenes: normal                 Vagina: normal appearing vagina with normal color and discharge, no lesions              Cervix: no lesions                Bimanual Exam:  Uterus:  normal size, contour, position, consistency, mobility, non-tender              Adnexa: no mass, fullness, tenderness           Gelhorn pessary removed, cleansed, and replaced.   Chaperone was present for exam.  ASSESSMENT  Incomplete uterovaginal prolapse. Prolapse controlled with Gelhorn pessary.  Stress incontinence.  Prominent cardiac murmur with septal hypertrophy and a small diameter LVOT.  Aortic valve calcification.  PLAN  We discussed continued pessary usage +/- potential periurethral bulking agent versus surgery.  I reviewed surgery as a major surgical procedure. Declines pelvic floor PT and consult with Dr. Matilde Sprang or other provider who performs urogynecologic care. FU in 3 months.    An After Visit Summary was  printed and given to the patient.  _25_____ minutes face to face time of which over 50% was spent in counseling.

## 2018-03-21 DIAGNOSIS — R7309 Other abnormal glucose: Secondary | ICD-10-CM | POA: Diagnosis not present

## 2018-03-21 DIAGNOSIS — E782 Mixed hyperlipidemia: Secondary | ICD-10-CM | POA: Diagnosis not present

## 2018-03-21 DIAGNOSIS — Z0001 Encounter for general adult medical examination with abnormal findings: Secondary | ICD-10-CM | POA: Diagnosis not present

## 2018-03-21 DIAGNOSIS — I10 Essential (primary) hypertension: Secondary | ICD-10-CM | POA: Diagnosis not present

## 2018-03-21 DIAGNOSIS — E669 Obesity, unspecified: Secondary | ICD-10-CM | POA: Diagnosis not present

## 2018-03-21 DIAGNOSIS — Z6837 Body mass index (BMI) 37.0-37.9, adult: Secondary | ICD-10-CM | POA: Diagnosis not present

## 2018-03-21 DIAGNOSIS — Z1389 Encounter for screening for other disorder: Secondary | ICD-10-CM | POA: Diagnosis not present

## 2018-03-28 NOTE — Progress Notes (Signed)
GYNECOLOGY  VISIT   HPI: 67 y.o.   Married  Caucasian  female   [phone] with Patient's last menstrual period was 04/26/1998 (within years).   here for 3 month pessary follow up.  No vaginal odor or discomfort.  No vaginal bleeding.   Can leak urine if she bends over.  Voiding more often to reduce this.   Bowel function is improved since she is using her pessary.  States she felt a little disappointment about not having surgery for her prolapse repair but is not accepting this.  She has a friend who had recurrence of her prolapse following surgery, and her friend is younger than she is per patient.   GYNECOLOGIC HISTORY: Patient's last menstrual period was 04/26/1998 (within years). Contraception: Tubal/Postmenopausal Menopausal hormone therapy:  none Last mammogram:   11/24/17 BIRADS 1 negative/density a Last pap smear:  4-5 years ago negative per patient        OB History    Gravida  4   Para  4   Term  0   Preterm  4   AB      Living  2     SAB      TAB      Ectopic      Multiple      Live Births  2              Patient Active Problem List   Diagnosis Date Noted  . Incomplete uterovaginal prolapse 11/10/2017  . Essential hypertension 02/16/2016  . Sciatic leg pain 02/16/2016    Past Medical History:  Diagnosis Date  . Hay fever   . Heart murmur   . Hemochromatosis   . Hypertension   . Migraines   . Sciatica     Past Surgical History:  Procedure Laterality Date  . CARPAL TUNNEL RELEASE Right   . COLONOSCOPY N/A 08/01/2014   Procedure: COLONOSCOPY;  Surgeon: Rogene Houston, MD;  Location: [facility];  Service: Endoscopy;  Laterality: N/A;  900 -- moved to 10:00 - Ann notified pt  . ELBOW SURGERY    . ROTATOR CUFF REPAIR Right 2012  . TONSILLECTOMY    . TUBAL LIGATION      Current Outpatient Medications  Medication Sig Dispense Refill  . celecoxib (CELEBREX) 200 MG capsule as needed.    . diltiazem (TIAZAC) 180 MG 24 hr capsule Take  180 mg by mouth daily.    . fluticasone (FLONASE) 50 MCG/ACT nasal spray Place 1-2 sprays into both nostrils as needed for allergies or rhinitis.     . naproxen sodium (ANAPROX) 220 MG tablet Take 220-440 mg by mouth daily as needed (headaches).    . nystatin (MYCOSTATIN/NYSTOP) powder Apply topically 3 (three) times daily. Apply to affected area for up to 7 days 30 g 2  . olmesartan-hydrochlorothiazide (BENICAR HCT) 40-25 MG tablet     . omeprazole (PRILOSEC) 20 MG capsule Take 20 mg by mouth daily.   3  . zolpidem (AMBIEN) 10 MG tablet Take 5 mg by mouth at bedtime.  2   No current facility-administered medications for this visit.      ALLERGIES: Statins  Family History  Problem Relation Age of Onset  . Heart attack Paternal Grandfather   . Migraines Maternal Grandmother   . Heart attack Maternal Grandfather   . Heart attack Father   . Heart murmur Father   . Cancer Mother        pancreatic cancer  . Heart  murmur Brother   . Breast cancer Maternal Aunt     Social History   Socioeconomic History  . Marital status: Married    Spouse name: Not on file  . Number of children: Not on file  . Years of education: Not on file  . Highest education level: Not on file  Occupational History  . Not on file  Social Needs  . Financial resource strain: Not on file  . Food insecurity:    Worry: Not on file    Inability: Not on file  . Transportation needs:    Medical: Not on file    Non-medical: Not on file  Tobacco Use  . Smoking status: Former Smoker    Packs/day: 0.10    Types: Cigarettes    Start date: 09/16/1962    Last attempt to quit: 09/15/1969    Years since quitting: 48.5  . Smokeless tobacco: Never Used  . Tobacco comment: as a teenager  Substance and Sexual Activity  . Alcohol use: Yes    Comment:  1-2 month  . Drug use: No  . Sexual activity: Not Currently    Partners: Female    Birth control/protection: Post-menopausal    Comment: BTL  Lifestyle  . Physical  activity:    Days per week: Not on file    Minutes per session: Not on file  . Stress: Not on file  Relationships  . Social connections:    Talks on phone: Not on file    Gets together: Not on file    Attends religious service: Not on file    Active member of club or organization: Not on file    Attends meetings of clubs or organizations: Not on file    Relationship status: Not on file  . Intimate partner violence:    Fear of current or ex partner: Not on file    Emotionally abused: Not on file    Physically abused: Not on file    Forced sexual activity: Not on file  Other Topics Concern  . Not on file  Social History Narrative  . Not on file    Review of Systems  All other systems reviewed and are negative.   PHYSICAL EXAMINATION:    BP 130/76   Pulse 70   Ht 5\' 10"  (1.778 m)   Wt 234 lb (106.1 kg)   LMP 04/26/1998 (Within Years)   BMI 33.58 kg/m     General appearance: alert, cooperative and appears stated age   Pelvic: External genitalia:  no lesions              Urethra:  normal appearing urethra with no masses, tenderness or lesions              Bartholins and Skenes: normal                 Vagina: normal appearing vagina with normal color and discharge, no lesions              Cervix: no lesions.  Very minor erythema of the cervix. No cervical or vaginal ulceration.                 Bimanual Exam:  Uterus:  normal size, contour, position, consistency, mobility, non-tender              Adnexa: no mass, fullness, tenderness            Gelhorn pessary removed, cleansed, and replaced.   Chaperone was present for  exam.  ASSESSMENT  Incomplete uterovaginal prolapse. Pessary maintenance.  PLAN  Continue pessary use.  Fu in 3 months.  We talked about recurrence risk of prolapse following surgery. I invited her for a second opinion regarding surgery fitness and care.  She declines.    An After Visit Summary was printed and given to the patient.  __15____  minutes face to face time of which over 50% was spent in counseling.

## 2018-03-29 ENCOUNTER — Encounter: Payer: Self-pay | Admitting: Obstetrics and Gynecology

## 2018-03-29 ENCOUNTER — Other Ambulatory Visit: Payer: Self-pay

## 2018-03-29 ENCOUNTER — Ambulatory Visit (INDEPENDENT_AMBULATORY_CARE_PROVIDER_SITE_OTHER): Payer: Medicare HMO | Admitting: Obstetrics and Gynecology

## 2018-03-29 VITALS — BP 130/76 | HR 70 | Ht 70.0 in | Wt 234.0 lb

## 2018-03-29 DIAGNOSIS — N812 Incomplete uterovaginal prolapse: Secondary | ICD-10-CM

## 2018-03-29 DIAGNOSIS — Z4689 Encounter for fitting and adjustment of other specified devices: Secondary | ICD-10-CM | POA: Diagnosis not present

## 2018-04-24 DIAGNOSIS — I10 Essential (primary) hypertension: Secondary | ICD-10-CM | POA: Diagnosis not present

## 2018-04-24 DIAGNOSIS — M1991 Primary osteoarthritis, unspecified site: Secondary | ICD-10-CM | POA: Diagnosis not present

## 2018-04-24 DIAGNOSIS — N342 Other urethritis: Secondary | ICD-10-CM | POA: Diagnosis not present

## 2018-04-24 DIAGNOSIS — E6609 Other obesity due to excess calories: Secondary | ICD-10-CM | POA: Diagnosis not present

## 2018-04-24 DIAGNOSIS — Z6836 Body mass index (BMI) 36.0-36.9, adult: Secondary | ICD-10-CM | POA: Diagnosis not present

## 2018-06-26 NOTE — Progress Notes (Signed)
GYNECOLOGY  VISIT   HPI: 68 y.o.   Married  Caucasian  female   [phone] with Patient's last menstrual period was 04/26/1998 (within years).   here for pessary check.  Has a vulvar cyst.  She tried to pop the cyst and could not.  No pain or bleeding.   She states she does not even know that it is there.   Does have occasional urinary incontinence and wearing pads. Can leak with a cough, sneeze, or bending over, but OK overall.  Able to have BMs and are better with less straining.   Satisfied with pessary and not doing surgery.   Had  UTI end of December and was treated with PCP.  Has had 4 over her adult life.   GYNECOLOGIC HISTORY: Patient's last menstrual period was 04/26/1998 (within years). Contraception:  Tubal/postmenopausal Menopausal hormone therapy:  none Last mammogram: 11/24/17 BIRADS 1 negative/density a Last pap smear: 4-5 years ago negative per patient        OB History    Gravida  4   Para  4   Term  0   Preterm  4   AB      Living  2     SAB      TAB      Ectopic      Multiple      Live Births  2              Patient Active Problem List   Diagnosis Date Noted  . Incomplete uterovaginal prolapse 11/10/2017  . Essential hypertension 02/16/2016  . Sciatic leg pain 02/16/2016    Past Medical History:  Diagnosis Date  . Hay fever   . Heart murmur   . Hemochromatosis   . Hypertension   . Migraines   . Sciatica     Past Surgical History:  Procedure Laterality Date  . CARPAL TUNNEL RELEASE Right   . COLONOSCOPY N/A 08/01/2014   Procedure: COLONOSCOPY;  Surgeon: Rogene Houston, MD;  Location: [facility];  Service: Endoscopy;  Laterality: N/A;  900 -- moved to 10:00 - Ann notified pt  . ELBOW SURGERY    . ROTATOR CUFF REPAIR Right 2012  . TONSILLECTOMY    . TUBAL LIGATION      Current Outpatient Medications  Medication Sig Dispense Refill  . diltiazem (TIAZAC) 180 MG 24 hr capsule Take 180 mg by mouth daily.    .  fluticasone (FLONASE) 50 MCG/ACT nasal spray Place 1-2 sprays into both nostrils as needed for allergies or rhinitis.     . naproxen sodium (ANAPROX) 220 MG tablet Take 220-440 mg by mouth daily as needed (headaches).    . nystatin (MYCOSTATIN/NYSTOP) powder Apply topically 3 (three) times daily. Apply to affected area for up to 7 days 30 g 2  . olmesartan-hydrochlorothiazide (BENICAR HCT) 40-25 MG tablet     . omeprazole (PRILOSEC) 20 MG capsule Take 20 mg by mouth daily.   3  . zolpidem (AMBIEN) 10 MG tablet Take 5 mg by mouth at bedtime.  2  . celecoxib (CELEBREX) 200 MG capsule as needed.     No current facility-administered medications for this visit.      ALLERGIES: Statins  Family History  Problem Relation Age of Onset  . Heart attack Paternal Grandfather   . Migraines Maternal Grandmother   . Heart attack Maternal Grandfather   . Heart attack Father   . Heart murmur Father   . Cancer Mother  pancreatic cancer  . Heart murmur Brother   . Breast cancer Maternal Aunt     Social History   Socioeconomic History  . Marital status: Married    Spouse name: Not on file  . Number of children: Not on file  . Years of education: Not on file  . Highest education level: Not on file  Occupational History  . Not on file  Social Needs  . Financial resource strain: Not on file  . Food insecurity:    Worry: Not on file    Inability: Not on file  . Transportation needs:    Medical: Not on file    Non-medical: Not on file  Tobacco Use  . Smoking status: Former Smoker    Packs/day: 0.10    Types: Cigarettes    Start date: 09/16/1962    Last attempt to quit: 09/15/1969    Years since quitting: 48.8  . Smokeless tobacco: Never Used  . Tobacco comment: as a teenager  Substance and Sexual Activity  . Alcohol use: Yes    Comment:  1-2 month  . Drug use: No  . Sexual activity: Not Currently    Partners: Female    Birth control/protection: Post-menopausal    Comment: BTL   Lifestyle  . Physical activity:    Days per week: Not on file    Minutes per session: Not on file  . Stress: Not on file  Relationships  . Social connections:    Talks on phone: Not on file    Gets together: Not on file    Attends religious service: Not on file    Active member of club or organization: Not on file    Attends meetings of clubs or organizations: Not on file    Relationship status: Not on file  . Intimate partner violence:    Fear of current or ex partner: Not on file    Emotionally abused: Not on file    Physically abused: Not on file    Forced sexual activity: Not on file  Other Topics Concern  . Not on file  Social History Narrative  . Not on file    Review of Systems  Constitutional: Negative.   HENT: Negative.   Eyes: Negative.   Respiratory: Negative.   Cardiovascular: Negative.   Gastrointestinal: Negative.   Endocrine: Negative.   Genitourinary:       Vulvar/vaginal lumps  Musculoskeletal: Negative.   Skin: Negative.   Allergic/Immunologic: Negative.   Neurological: Negative.   Hematological: Negative.   Psychiatric/Behavioral: Negative.     PHYSICAL EXAMINATION:    BP 138/74 (BP Location: Right Arm, Patient Position: Sitting, Cuff Size: Large)   Pulse 68   Resp 16   Ht 5\' 10"  (1.778 m)   Wt 235 lb (106.6 kg)   LMP 04/26/1998 (Within Years)   BMI 33.72 kg/m     General appearance: alert, cooperative and appears stated age   Pelvic: External genitalia:  no lesions.. right labia majora with 4 mm subtle cystic area.               Urethra:  normal appearing urethra with no masses, tenderness or lesions              Bartholins and Skenes: normal                 Vagina: normal appearing vagina with normal color and discharge, no lesions.  MIld erythema of anterior and posterior vaginal mucosa more on right.  Cervix: no lesions                Bimanual Exam:  Uterus:  normal size, contour, position, consistency, mobility,  non-tender              Adnexa: no mass, fullness, tenderness             Gelhorn pessary removed, cleansed and replaced.   Chaperone was present for exam.  ASSESSMENT  Right labial sebaceous cyst? Incomplete uterovaginal prolapse.   PLAN  Reassurance regarding labial cyst.  I do not recommend trying to drain this.  Continue pessary use.  Fu in 3 months.    An After Visit Summary was printed and given to the patient.  __15____ minutes face to face time of which over 50% was spent in counseling.

## 2018-06-28 ENCOUNTER — Ambulatory Visit: Payer: Medicare Other | Admitting: Obstetrics and Gynecology

## 2018-06-28 ENCOUNTER — Other Ambulatory Visit: Payer: Self-pay

## 2018-06-28 ENCOUNTER — Encounter: Payer: Self-pay | Admitting: Obstetrics and Gynecology

## 2018-06-28 VITALS — BP 138/74 | HR 68 | Resp 16 | Ht 70.0 in | Wt 235.0 lb

## 2018-06-28 DIAGNOSIS — Z4689 Encounter for fitting and adjustment of other specified devices: Secondary | ICD-10-CM

## 2018-06-28 DIAGNOSIS — N812 Incomplete uterovaginal prolapse: Secondary | ICD-10-CM | POA: Diagnosis not present

## 2018-06-29 DIAGNOSIS — Z1389 Encounter for screening for other disorder: Secondary | ICD-10-CM | POA: Diagnosis not present

## 2018-06-29 DIAGNOSIS — E7849 Other hyperlipidemia: Secondary | ICD-10-CM | POA: Diagnosis not present

## 2018-06-29 DIAGNOSIS — R7309 Other abnormal glucose: Secondary | ICD-10-CM | POA: Diagnosis not present

## 2018-06-29 DIAGNOSIS — I10 Essential (primary) hypertension: Secondary | ICD-10-CM | POA: Diagnosis not present

## 2018-06-29 DIAGNOSIS — M1991 Primary osteoarthritis, unspecified site: Secondary | ICD-10-CM | POA: Diagnosis not present

## 2018-08-11 DIAGNOSIS — I10 Essential (primary) hypertension: Secondary | ICD-10-CM | POA: Diagnosis not present

## 2018-08-11 DIAGNOSIS — R7309 Other abnormal glucose: Secondary | ICD-10-CM | POA: Diagnosis not present

## 2018-08-11 DIAGNOSIS — Z0000 Encounter for general adult medical examination without abnormal findings: Secondary | ICD-10-CM | POA: Diagnosis not present

## 2018-08-11 DIAGNOSIS — Z1389 Encounter for screening for other disorder: Secondary | ICD-10-CM | POA: Diagnosis not present

## 2018-09-29 ENCOUNTER — Ambulatory Visit: Payer: Medicare Other | Admitting: Obstetrics and Gynecology

## 2018-10-30 ENCOUNTER — Ambulatory Visit: Payer: Medicare Other | Admitting: Obstetrics and Gynecology

## 2018-11-03 ENCOUNTER — Other Ambulatory Visit: Payer: Self-pay

## 2018-11-07 ENCOUNTER — Ambulatory Visit (INDEPENDENT_AMBULATORY_CARE_PROVIDER_SITE_OTHER): Payer: Medicare Other | Admitting: Obstetrics and Gynecology

## 2018-11-07 ENCOUNTER — Encounter: Payer: Self-pay | Admitting: Obstetrics and Gynecology

## 2018-11-07 ENCOUNTER — Other Ambulatory Visit: Payer: Self-pay

## 2018-11-07 VITALS — BP 128/72 | HR 72 | Temp 97.3°F | Resp 12 | Ht 70.0 in | Wt 237.0 lb

## 2018-11-07 DIAGNOSIS — N812 Incomplete uterovaginal prolapse: Secondary | ICD-10-CM | POA: Diagnosis not present

## 2018-11-07 DIAGNOSIS — Z4689 Encounter for fitting and adjustment of other specified devices: Secondary | ICD-10-CM

## 2018-11-07 NOTE — Progress Notes (Signed)
GYNECOLOGY  VISIT   HPI: 68 y.o.   Married  Caucasian  female   [phone] with Patient's last menstrual period was 04/26/1998 (within years).   here for pessary check    Denies any problems.  Denies bleeding or discharge.   Bladder control:  States she tends to leak with standing, sneezing, coughing.  Bowel function:  Now she is feeling pressure like she needs to have a bowel movement.  She is having BMs.   She does not feel any prolapse beyond the pessary.  Notes that her perineum is wrinkly feeling.   No more UTIs.   Her husband has some health issues and does not drive.   GYNECOLOGIC HISTORY: Patient's last menstrual period was 04/26/1998 (within years). Contraception:  Tubal/postmenopausal Menopausal hormone therapy:  none Last mammogram:  11/24/17 BIRADS 1 negative/density a Last pap smear:   4-5 years ago negative per patient        OB History    Gravida  4   Para  4   Term  0   Preterm  4   AB      Living  2     SAB      TAB      Ectopic      Multiple      Live Births  2              Patient Active Problem List   Diagnosis Date Noted  . Incomplete uterovaginal prolapse 11/10/2017  . Essential hypertension 02/16/2016  . Sciatic leg pain 02/16/2016    Past Medical History:  Diagnosis Date  . Hay fever   . Heart murmur   . Hemochromatosis   . Hypertension   . Migraines   . Sciatica     Past Surgical History:  Procedure Laterality Date  . CARPAL TUNNEL RELEASE Right   . COLONOSCOPY N/A 08/01/2014   Procedure: COLONOSCOPY;  Surgeon: Rogene Houston, MD;  Location: [facility];  Service: Endoscopy;  Laterality: N/A;  900 -- moved to 10:00 - Ann notified pt  . ELBOW SURGERY    . ROTATOR CUFF REPAIR Right 2012  . TONSILLECTOMY    . TUBAL LIGATION      Current Outpatient Medications  Medication Sig Dispense Refill  . diltiazem (TIAZAC) 180 MG 24 hr capsule Take 180 mg by mouth daily.    . fluticasone (FLONASE) 50 MCG/ACT nasal spray  Place 1-2 sprays into both nostrils as needed for allergies or rhinitis.     . naproxen sodium (ANAPROX) 220 MG tablet Take 220-440 mg by mouth daily as needed (headaches).    . nystatin (MYCOSTATIN/NYSTOP) powder Apply topically 3 (three) times daily. Apply to affected area for up to 7 days 30 g 2  . olmesartan-hydrochlorothiazide (BENICAR HCT) 40-25 MG tablet     . omeprazole (PRILOSEC) 20 MG capsule Take 20 mg by mouth daily.   3  . rosuvastatin (CRESTOR) 5 MG tablet Take 5 mg by mouth daily.    Marland Kitchen zolpidem (AMBIEN) 10 MG tablet Take 5 mg by mouth at bedtime.  2   No current facility-administered medications for this visit.      ALLERGIES: Statins  Family History  Problem Relation Age of Onset  . Heart attack Paternal Grandfather   . Migraines Maternal Grandmother   . Heart attack Maternal Grandfather   . Heart attack Father   . Heart murmur Father   . Cancer Mother        pancreatic  cancer  . Heart murmur Brother   . Breast cancer Maternal Aunt     Social History   Socioeconomic History  . Marital status: Married    Spouse name: Not on file  . Number of children: Not on file  . Years of education: Not on file  . Highest education level: Not on file  Occupational History  . Not on file  Social Needs  . Financial resource strain: Not on file  . Food insecurity    Worry: Not on file    Inability: Not on file  . Transportation needs    Medical: Not on file    Non-medical: Not on file  Tobacco Use  . Smoking status: Former Smoker    Packs/day: 0.10    Types: Cigarettes    Start date: 09/16/1962    Quit date: 09/15/1969    Years since quitting: 49.1  . Smokeless tobacco: Never Used  . Tobacco comment: as a teenager  Substance and Sexual Activity  . Alcohol use: Yes    Comment:  1-2 month  . Drug use: No  . Sexual activity: Not Currently    Partners: Female    Birth control/protection: Post-menopausal    Comment: BTL  Lifestyle  . Physical activity    Days per  week: Not on file    Minutes per session: Not on file  . Stress: Not on file  Relationships  . Social Herbalist on phone: Not on file    Gets together: Not on file    Attends religious service: Not on file    Active member of club or organization: Not on file    Attends meetings of clubs or organizations: Not on file    Relationship status: Not on file  . Intimate partner violence    Fear of current or ex partner: Not on file    Emotionally abused: Not on file    Physically abused: Not on file    Forced sexual activity: Not on file  Other Topics Concern  . Not on file  Social History Narrative  . Not on file    Review of Systems  Constitutional: Negative.   HENT: Negative.   Eyes: Negative.   Respiratory: Negative.   Cardiovascular: Negative.   Gastrointestinal: Negative.   Endocrine: Negative.   Genitourinary: Negative.   Musculoskeletal: Negative.   Skin: Negative.   Allergic/Immunologic: Negative.   Neurological: Negative.   Hematological: Negative.   Psychiatric/Behavioral: Negative.     PHYSICAL EXAMINATION:    BP 128/72 (BP Location: Left Arm, Patient Position: Sitting, Cuff Size: Large)   Pulse 72   Temp (!) 97.3 F (36.3 C) (Temporal)   Resp 12   Ht 5\' 10"  (1.778 m)   Wt 237 lb (107.5 kg)   LMP 04/26/1998 (Within Years)   BMI 34.01 kg/m     General appearance: alert, cooperative and appears stated age  Pelvic: External genitalia:  no lesions              Urethra:  normal appearing urethra with no masses, tenderness or lesions              Bartholins and Skenes: normal                 Vagina: erythema of the posterior vaginal mucosa.               Cervix: no lesions  Bimanual Exam:  Uterus:  normal size, contour, position, consistency, mobility, non-tender              Adnexa: no mass, fullness, tenderness              Gelhorn pessary removed, cleansed and replaced with surgilube.   Chaperone was present for  exam.  ASSESSMENT  Pessary maintenance.  Incomplete uterovaginal prolapse.   PLAN  Reassurance regarding continuation of use.  We talked about erythema and how common this is to have inflammation from pessary use.  She and I are content to continue pessary care and avoid surgery at this time.  FU in 3 months.    An After Visit Summary was printed and given to the patient.  __15____ minutes face to face time of which over 50% was spent in counseling.

## 2018-11-08 DIAGNOSIS — I10 Essential (primary) hypertension: Secondary | ICD-10-CM | POA: Diagnosis not present

## 2018-11-08 DIAGNOSIS — Z1389 Encounter for screening for other disorder: Secondary | ICD-10-CM | POA: Diagnosis not present

## 2018-11-08 DIAGNOSIS — R7309 Other abnormal glucose: Secondary | ICD-10-CM | POA: Diagnosis not present

## 2018-11-08 DIAGNOSIS — M1991 Primary osteoarthritis, unspecified site: Secondary | ICD-10-CM | POA: Diagnosis not present

## 2018-11-08 DIAGNOSIS — R011 Cardiac murmur, unspecified: Secondary | ICD-10-CM | POA: Diagnosis not present

## 2018-11-08 DIAGNOSIS — E7849 Other hyperlipidemia: Secondary | ICD-10-CM | POA: Diagnosis not present

## 2018-12-25 ENCOUNTER — Other Ambulatory Visit (HOSPITAL_COMMUNITY): Payer: Self-pay | Admitting: Family Medicine

## 2018-12-25 DIAGNOSIS — Z13820 Encounter for screening for osteoporosis: Secondary | ICD-10-CM

## 2019-01-10 ENCOUNTER — Inpatient Hospital Stay (HOSPITAL_COMMUNITY): Admission: RE | Admit: 2019-01-10 | Payer: Medicare Other | Source: Ambulatory Visit

## 2019-01-10 ENCOUNTER — Encounter (HOSPITAL_COMMUNITY): Payer: Self-pay

## 2019-02-05 ENCOUNTER — Other Ambulatory Visit: Payer: Self-pay

## 2019-02-06 NOTE — Progress Notes (Signed)
GYNECOLOGY  VISIT   HPI: 68 y.o.   Married  Caucasian  female   [phone] with Patient's last menstrual period was 04/26/1998 (within years).   here for 3 month pessary check.     Feels her pessary shift to the back when she sits down on her rocking chair.  Lasts a second and then passes. Uses a pad every day for bladder protection.  No vaginal bleeding or vaginal discharge. No painful urination.  Able to void and have BMs.  Gained 5 pounds during the pandemic.  Ready to take the weight back off.   GYNECOLOGIC HISTORY: Patient's last menstrual period was 04/26/1998 (within years). Contraception:  Tubal/postmenopausal Menopausal hormone therapy:  none Last mammogram:  11/24/17 BIRADS 1 negative/density a Last pap smear:  4-5 years ago negative per patient        OB History    Gravida  4   Para  4   Term  0   Preterm  4   AB      Living  2     SAB      TAB      Ectopic      Multiple      Live Births  2              Patient Active Problem List   Diagnosis Date Noted  . Incomplete uterovaginal prolapse 11/10/2017  . Essential hypertension 02/16/2016  . Sciatic leg pain 02/16/2016    Past Medical History:  Diagnosis Date  . Hay fever   . Heart murmur   . Hemochromatosis   . Hypertension   . Migraines   . Sciatica     Past Surgical History:  Procedure Laterality Date  . CARPAL TUNNEL RELEASE Right   . COLONOSCOPY N/A 08/01/2014   Procedure: COLONOSCOPY;  Surgeon: Rogene Houston, MD;  Location: [facility];  Service: Endoscopy;  Laterality: N/A;  900 -- moved to 10:00 - Ann notified pt  . ELBOW SURGERY    . ROTATOR CUFF REPAIR Right 2012  . TONSILLECTOMY    . TUBAL LIGATION      Current Outpatient Medications  Medication Sig Dispense Refill  . diltiazem (TIAZAC) 180 MG 24 hr capsule Take 180 mg by mouth daily.    . fluticasone (FLONASE) 50 MCG/ACT nasal spray Place 1-2 sprays into both nostrils as needed for allergies or rhinitis.     .  naproxen sodium (ANAPROX) 220 MG tablet Take 220-440 mg by mouth daily as needed (headaches).    . nystatin (MYCOSTATIN/NYSTOP) powder Apply topically 3 (three) times daily. Apply to affected area for up to 7 days 30 g 2  . olmesartan-hydrochlorothiazide (BENICAR HCT) 40-25 MG tablet     . omeprazole (PRILOSEC) 20 MG capsule Take 20 mg by mouth daily.   3  . rosuvastatin (CRESTOR) 5 MG tablet Take 5 mg by mouth daily.    Marland Kitchen zolpidem (AMBIEN) 10 MG tablet Take 5 mg by mouth at bedtime.  2   No current facility-administered medications for this visit.      ALLERGIES: Statins  Family History  Problem Relation Age of Onset  . Heart attack Paternal Grandfather   . Migraines Maternal Grandmother   . Heart attack Maternal Grandfather   . Heart attack Father   . Heart murmur Father   . Cancer Mother        pancreatic cancer  . Heart murmur Brother   . Breast cancer Maternal Aunt  Social History   Socioeconomic History  . Marital status: Married    Spouse name: Not on file  . Number of children: Not on file  . Years of education: Not on file  . Highest education level: Not on file  Occupational History  . Not on file  Social Needs  . Financial resource strain: Not on file  . Food insecurity    Worry: Not on file    Inability: Not on file  . Transportation needs    Medical: Not on file    Non-medical: Not on file  Tobacco Use  . Smoking status: Former Smoker    Packs/day: 0.10    Types: Cigarettes    Start date: 09/16/1962    Quit date: 09/15/1969    Years since quitting: 49.4  . Smokeless tobacco: Never Used  . Tobacco comment: as a teenager  Substance and Sexual Activity  . Alcohol use: Yes    Comment:  1-2 month  . Drug use: No  . Sexual activity: Not Currently    Partners: Female    Birth control/protection: Post-menopausal    Comment: BTL  Lifestyle  . Physical activity    Days per week: Not on file    Minutes per session: Not on file  . Stress: Not on file   Relationships  . Social Herbalist on phone: Not on file    Gets together: Not on file    Attends religious service: Not on file    Active member of club or organization: Not on file    Attends meetings of clubs or organizations: Not on file    Relationship status: Not on file  . Intimate partner violence    Fear of current or ex partner: Not on file    Emotionally abused: Not on file    Physically abused: Not on file    Forced sexual activity: Not on file  Other Topics Concern  . Not on file  Social History Narrative  . Not on file    Review of Systems  All other systems reviewed and are negative.   PHYSICAL EXAMINATION:    BP (!) 166/84 (Cuff Size: Large)   Pulse 68   Temp 97.8 F (36.6 C) (Temporal)   Ht 5\' 10"  (1.778 m)   Wt 240 lb (108.9 kg)   LMP 04/26/1998 (Within Years)   BMI 34.44 kg/m     General appearance: alert, cooperative and appears stated age   Pelvic: External genitalia:  no lesions              Urethra:  normal appearing urethra with no masses, tenderness or lesions              Bartholins and Skenes: normal                 Vagina: posterior vaginal cuff with 3 cm area of inflammatory change.  No bleeding.               Cervix: small area of inflammatory change at 11:00.  No bleeding.                 Bimanual Exam:  Uterus:  normal size, contour, position, consistency, mobility, non-tender              Adnexa: no mass, fullness, tenderness            Pessary removed, cleansed, placed in biobag and given to patient.   Chaperone was present  for exam.  ASSESSMENT  Incomplete uterovaginal prolapse.  Pessary maintenance.  Vaginal and cervical inflammation.  PLAN  Will keep pessary out for 7 - 10 days.  She will update her mammogram.  I prescribed Estrace vaginal cream.  She will bring it with her to her follow up appointment, and I will instruct her in use then.  I did discuss the potential effect on breast cancer.    An After  Visit Summary was printed and given to the patient.  ___15___ minutes face to face time of which over 50% was spent in counseling.

## 2019-02-07 ENCOUNTER — Ambulatory Visit: Payer: Medicare Other | Admitting: Obstetrics and Gynecology

## 2019-02-07 ENCOUNTER — Encounter: Payer: Self-pay | Admitting: Obstetrics and Gynecology

## 2019-02-07 ENCOUNTER — Other Ambulatory Visit: Payer: Self-pay

## 2019-02-07 VITALS — BP 166/84 | HR 68 | Temp 97.8°F | Ht 70.0 in | Wt 240.0 lb

## 2019-02-07 DIAGNOSIS — N760 Acute vaginitis: Secondary | ICD-10-CM | POA: Diagnosis not present

## 2019-02-07 DIAGNOSIS — N812 Incomplete uterovaginal prolapse: Secondary | ICD-10-CM

## 2019-02-07 DIAGNOSIS — T8369XA Infection and inflammatory reaction due to other prosthetic device, implant and graft in genital tract, initial encounter: Secondary | ICD-10-CM | POA: Diagnosis not present

## 2019-02-07 DIAGNOSIS — Z4689 Encounter for fitting and adjustment of other specified devices: Secondary | ICD-10-CM | POA: Diagnosis not present

## 2019-02-07 MED ORDER — ESTRADIOL 0.1 MG/GM VA CREA: TOPICAL_CREAM | VAGINAL | 0 refills | Status: DC

## 2019-02-12 ENCOUNTER — Other Ambulatory Visit: Payer: Self-pay

## 2019-02-12 ENCOUNTER — Ambulatory Visit (HOSPITAL_COMMUNITY)
Admission: RE | Admit: 2019-02-12 | Discharge: 2019-02-12 | Disposition: A | Discharge status: Home / Self Care | Payer: Medicare Other | Source: Ambulatory Visit | Attending: Family Medicine | Admitting: Family Medicine

## 2019-02-12 DIAGNOSIS — Z13820 Encounter for screening for osteoporosis: Secondary | ICD-10-CM | POA: Insufficient documentation

## 2019-02-12 DIAGNOSIS — Z1231 Encounter for screening mammogram for malignant neoplasm of breast: Secondary | ICD-10-CM | POA: Insufficient documentation

## 2019-02-14 ENCOUNTER — Ambulatory Visit (INDEPENDENT_AMBULATORY_CARE_PROVIDER_SITE_OTHER): Payer: Medicare Other | Admitting: Obstetrics and Gynecology

## 2019-02-14 ENCOUNTER — Other Ambulatory Visit: Payer: Self-pay

## 2019-02-14 ENCOUNTER — Encounter: Payer: Self-pay | Admitting: Obstetrics and Gynecology

## 2019-02-14 VITALS — BP 146/74 | HR 76 | Temp 98.0°F | Ht 70.0 in | Wt 237.0 lb

## 2019-02-14 DIAGNOSIS — N812 Incomplete uterovaginal prolapse: Secondary | ICD-10-CM

## 2019-02-14 NOTE — Progress Notes (Signed)
GYNECOLOGY  VISIT   HPI: 68 y.o.   Married  Caucasian  female   [phone] with Patient's last menstrual period was 04/26/1998 (within years).   here for pessary check.  Patient has decided not to continue with Gelhorn pessary. She would like to reassess prolapse.  With the pessary in, she was experiencing a lot of urinary incontinence.  She is now more dry urinary wise, but she is feeling pressure.  She is having bowel movement problems.   She is considering surgery.  She would like to see Dr. Zigmund Daniel and has an appointment for November 18. Patient is requesting records be sent to her.   Patient did not pick up the estrogen cream.   She was not able to use a donut pessary in the past.   Patient has a significant cardiac murmur. Her transthoracic echocardiogram from 09/19/15 shows: Mild LVH with moderate to severe asymmetric septal hypertrophy   and LVEF 65-70%. There is mitral chordal SAM and relatively small   LVOT diameter. There is a mild resting LVOT gradient that   increases to small degree with Valsalva. Grade 1 diastolic   dysfunction with elevated LV filling pressure. Mild left atrial   enlargement. 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 Moderate to severe aortic annular calcification   with mild aortic regurgitation. Trivial tricuspid regurgitation   with PASP 26 mmHg. Cannot exclude PFO versus fenestrated septum.   GYNECOLOGIC HISTORY: Patient's last menstrual period was 04/26/1998 (within years). Contraception: Tubal/postmenopausal Menopausal hormone therapy: none Last mammogram: 02-12-19 3D/Neg/density B/BiRads1 Last pap smear:  4-5 years ago negative per patient        OB History    Gravida  4   Para  4   Term  0   Preterm  4   AB      Living  2     SAB      TAB      Ectopic      Multiple      Live Births  2              Patient Active Problem List   Diagnosis Date Noted  . Incomplete uterovaginal prolapse  11/10/2017  . Essential hypertension 02/16/2016  . Sciatic leg pain 02/16/2016    Past Medical History:  Diagnosis Date  . Hay fever   . Heart murmur   . Hemochromatosis   . Hypertension   . Migraines   . Sciatica     Past Surgical History:  Procedure Laterality Date  . CARPAL TUNNEL RELEASE Right   . COLONOSCOPY N/A 08/01/2014   Procedure: COLONOSCOPY;  Surgeon: Rogene Houston, MD;  Location: [facility];  Service: Endoscopy;  Laterality: N/A;  900 -- moved to 10:00 - Ann notified pt  . ELBOW SURGERY    . ROTATOR CUFF REPAIR Right 2012  . TONSILLECTOMY    . TUBAL LIGATION      Current Outpatient Medications  Medication Sig Dispense Refill  . diltiazem (TIAZAC) 180 MG 24 hr capsule Take 180 mg by mouth daily.    . fluticasone (FLONASE) 50 MCG/ACT nasal spray Place 1-2 sprays into both nostrils as needed for allergies or rhinitis.     . naproxen sodium (ANAPROX) 220 MG tablet Take 220-440 mg by mouth daily as needed (headaches).    . nystatin (MYCOSTATIN/NYSTOP) powder Apply topically 3 (three) times daily. Apply to affected area for up to 7 days 30 g 2  . olmesartan-hydrochlorothiazide (BENICAR HCT)  40-25 MG tablet     . omeprazole (PRILOSEC) 20 MG capsule Take 20 mg by mouth daily.   3  . rosuvastatin (CRESTOR) 5 MG tablet Take 5 mg by mouth daily.    Marland Kitchen zolpidem (AMBIEN) 10 MG tablet Take 5 mg by mouth at bedtime.  2  . estradiol (ESTRACE) 0.1 MG/GM vaginal cream Use 1/2 g vaginally every night for the first 2 weeks, then use 1/2 g vaginally two or three times per week as needed to maintain symptom relief. (Patient not taking: Reported on 02/14/2019) 42.5 g 0   No current facility-administered medications for this visit.      ALLERGIES: Statins  Family History  Problem Relation Age of Onset  . Heart attack Paternal Grandfather   . Migraines Maternal Grandmother   . Heart attack Maternal Grandfather   . Heart attack Father   . Heart murmur Father   . Cancer Mother         pancreatic cancer  . Heart murmur Brother   . Breast cancer Maternal Aunt     Social History   Socioeconomic History  . Marital status: Married    Spouse name: Not on file  . Number of children: Not on file  . Years of education: Not on file  . Highest education level: Not on file  Occupational History  . Not on file  Social Needs  . Financial resource strain: Not on file  . Food insecurity    Worry: Not on file    Inability: Not on file  . Transportation needs    Medical: Not on file    Non-medical: Not on file  Tobacco Use  . Smoking status: Former Smoker    Packs/day: 0.10    Types: Cigarettes    Start date: 09/16/1962    Quit date: 09/15/1969    Years since quitting: 49.4  . Smokeless tobacco: Never Used  . Tobacco comment: as a teenager  Substance and Sexual Activity  . Alcohol use: Yes    Comment:  1-2 month  . Drug use: No  . Sexual activity: Not Currently    Partners: Female    Birth control/protection: Post-menopausal    Comment: BTL  Lifestyle  . Physical activity    Days per week: Not on file    Minutes per session: Not on file  . Stress: Not on file  Relationships  . Social Herbalist on phone: Not on file    Gets together: Not on file    Attends religious service: Not on file    Active member of club or organization: Not on file    Attends meetings of clubs or organizations: Not on file    Relationship status: Not on file  . Intimate partner violence    Fear of current or ex partner: Not on file    Emotionally abused: Not on file    Physically abused: Not on file    Forced sexual activity: Not on file  Other Topics Concern  . Not on file  Social History Narrative  . Not on file    Review of Systems  All other systems reviewed and are negative.   PHYSICAL EXAMINATION:    BP (!) 146/74 (Cuff Size: Large)   Pulse 76   Temp 98 F (36.7 C) (Temporal)   Ht _0  (1.778 m)   Wt 237 lb (107.5 kg)   LMP 04/26/1998 (Within  Years)   BMI 34.01 kg/m  General appearance: alert, cooperative and appears stated age   Pelvic: External genitalia:  no lesions              Urethra:  normal appearing urethra with no masses, tenderness or lesions              Bartholins and Skenes: normal                 Vagina: normal appearing vagina with normal color and discharge, rim of posterior vaginal erythema.              Cervix: no lesions                Bimanual Exam:  Uterus:  normal size, contour, position, consistency, mobility, non-tender              Adnexa: no mass, fullness, tenderness              Chaperone was present for exam.  ASSESSMENT  Incomplete uterovaginal prolapse.  Urinary incontinence. Vaginal ulceration and inflammation healing with absence of pessary. Prominent heart murmur with valvular disease. HTN.   PLAN  Patient will continue without pessary.  We discussed a potential vaginal approach for surgical care which does not require the same amount of Trendelenburg.  She may benefit from a tertiary care system if surgery is performed.  Consultation with Dr. Maryland Pink to discuss options for prolapse/incontinence care.    An After Visit Summary was printed and given to the patient.  __15____ minutes face to face time of which over 50% was spent in counseling.

## 2019-03-05 ENCOUNTER — Telehealth: Payer: Self-pay | Admitting: Obstetrics and Gynecology

## 2019-03-05 ENCOUNTER — Ambulatory Visit (INDEPENDENT_AMBULATORY_CARE_PROVIDER_SITE_OTHER): Payer: Medicare Other | Admitting: Obstetrics and Gynecology

## 2019-03-05 ENCOUNTER — Encounter: Payer: Self-pay | Admitting: Obstetrics and Gynecology

## 2019-03-05 ENCOUNTER — Other Ambulatory Visit: Payer: Self-pay

## 2019-03-05 VITALS — BP 130/84 | HR 60 | Temp 97.5°F | Ht 70.0 in | Wt 237.0 lb

## 2019-03-05 DIAGNOSIS — R3989 Other symptoms and signs involving the genitourinary system: Secondary | ICD-10-CM

## 2019-03-05 DIAGNOSIS — N812 Incomplete uterovaginal prolapse: Secondary | ICD-10-CM | POA: Diagnosis not present

## 2019-03-05 LAB — POCT URINALYSIS DIPSTICK
Bilirubin, UA: NEGATIVE
Glucose, UA: NEGATIVE
Ketones, UA: NEGATIVE
Nitrite, UA: NEGATIVE
Protein, UA: NEGATIVE
Urobilinogen, UA: 0.2 E.U./dL
pH, UA: 5 (ref 5.0–8.0)

## 2019-03-05 NOTE — Telephone Encounter (Signed)
Spoke with patient. Patient was seen in office on 10/21 for uterovaginal prolapse, pessary was removed. Patient has left pessary out, is exploring options for surgery in the future. Patient reports she has been doing well until Thursday. Reports increased pressure and discomfort when lying down and walking, feels better to sit. Increased pressure making it difficult to void and have BMs. Patient is able to void, it is easier in the mornings, more difficult as day progresses.   Covid 19 prescreen negative, precautions reviewed. OV scheduled for today at 4pm with Dr. Quincy Simmonds.   Routing to provider for final review. Patient is agreeable to disposition. Will close encounter.

## 2019-03-05 NOTE — Telephone Encounter (Signed)
Patient removed her pessary and "has been miserable since". She asked to come in today to see Dr.Silva. To triage to assist with scheduling.

## 2019-03-05 NOTE — Progress Notes (Signed)
GYNECOLOGY  VISIT   HPI: 68 y.o.   Married  Caucasian  female   [phone] with Patient's last menstrual period was 04/26/1998 (within years).   here for prolapse.  Patient has pessary out and now feeling like "everything falling out". Complaining of lower pelvic pressure and pain at urethra.  She is having right lower quadrant menstrual like cramping and constant pressure to void.  She can only void well in the am.  Feels like her bladder is spasming.   She wants to have her Gelhorn pessary placed again.   Urine dip - 2+ WBC and 1+ RBCs.  Not enough to send for UC.  GYNECOLOGIC HISTORY: Patient's last menstrual period was 04/26/1998 (within years). Contraception: tubal/postmenopausal Menopausal hormone therapy: none Last mammogram:  02-12-19 3D/Neg/density B/BiRads1 Last pap smear: 4-5 years ago negative per patient                OB History    Gravida  4   Para  4   Term  0   Preterm  4   AB      Living  2     SAB      TAB      Ectopic      Multiple      Live Births  2              Patient Active Problem List   Diagnosis Date Noted  . Incomplete uterovaginal prolapse 11/10/2017  . Essential hypertension 02/16/2016  . Sciatic leg pain 02/16/2016    Past Medical History:  Diagnosis Date  . Hay fever   . Heart murmur   . Hemochromatosis   . Hypertension   . Migraines   . Sciatica     Past Surgical History:  Procedure Laterality Date  . CARPAL TUNNEL RELEASE Right   . COLONOSCOPY N/A 08/01/2014   Procedure: COLONOSCOPY;  Surgeon: Rogene Houston, MD;  Location: [facility];  Service: Endoscopy;  Laterality: N/A;  900 -- moved to 10:00 - Ann notified pt  . ELBOW SURGERY    . ROTATOR CUFF REPAIR Right 2012  . TONSILLECTOMY    . TUBAL LIGATION      Current Outpatient Medications  Medication Sig Dispense Refill  . diltiazem (TIAZAC) 180 MG 24 hr capsule Take 180 mg by mouth daily.    Marland Kitchen estradiol (ESTRACE) 0.1 MG/GM vaginal cream Use 1/2 g  vaginally every night for the first 2 weeks, then use 1/2 g vaginally two or three times per week as needed to maintain symptom relief. 42.5 g 0  . fluticasone (FLONASE) 50 MCG/ACT nasal spray Place 1-2 sprays into both nostrils as needed for allergies or rhinitis.     . naproxen sodium (ANAPROX) 220 MG tablet Take 220-440 mg by mouth daily as needed (headaches).    . nystatin (MYCOSTATIN/NYSTOP) powder Apply topically 3 (three) times daily. Apply to affected area for up to 7 days 30 g 2  . olmesartan-hydrochlorothiazide (BENICAR HCT) 40-25 MG tablet     . omeprazole (PRILOSEC) 20 MG capsule Take 20 mg by mouth daily.   3  . rosuvastatin (CRESTOR) 5 MG tablet Take 5 mg by mouth daily.    Marland Kitchen zolpidem (AMBIEN) 10 MG tablet Take 5 mg by mouth at bedtime.  2   No current facility-administered medications for this visit.      ALLERGIES: Statins  Family History  Problem Relation Age of Onset  . Heart attack Paternal Grandfather   .  Migraines Maternal Grandmother   . Heart attack Maternal Grandfather   . Heart attack Father   . Heart murmur Father   . Cancer Mother        pancreatic cancer  . Heart murmur Brother   . Breast cancer Maternal Aunt     Social History   Socioeconomic History  . Marital status: Married    Spouse name: Not on file  . Number of children: Not on file  . Years of education: Not on file  . Highest education level: Not on file  Occupational History  . Not on file  Social Needs  . Financial resource strain: Not on file  . Food insecurity    Worry: Not on file    Inability: Not on file  . Transportation needs    Medical: Not on file    Non-medical: Not on file  Tobacco Use  . Smoking status: Former Smoker    Packs/day: 0.10    Types: Cigarettes    Start date: 09/16/1962    Quit date: 09/15/1969    Years since quitting: 49.5  . Smokeless tobacco: Never Used  . Tobacco comment: as a teenager  Substance and Sexual Activity  . Alcohol use: Yes    Comment:   1-2 month  . Drug use: No  . Sexual activity: Not Currently    Partners: Female    Birth control/protection: Post-menopausal    Comment: BTL  Lifestyle  . Physical activity    Days per week: Not on file    Minutes per session: Not on file  . Stress: Not on file  Relationships  . Social Herbalist on phone: Not on file    Gets together: Not on file    Attends religious service: Not on file    Active member of club or organization: Not on file    Attends meetings of clubs or organizations: Not on file    Relationship status: Not on file  . Intimate partner violence    Fear of current or ex partner: Not on file    Emotionally abused: Not on file    Physically abused: Not on file    Forced sexual activity: Not on file  Other Topics Concern  . Not on file  Social History Narrative  . Not on file    Review of Systems  All other systems reviewed and are negative.   PHYSICAL EXAMINATION:    BP 130/84 (Cuff Size: Large)   Pulse 60   Temp (!) 97.5 F (36.4 C) (Temporal)   Ht 5\' 10"  (1.778 m)   Wt 237 lb (107.5 kg)   LMP 04/26/1998 (Within Years)   BMI 34.01 kg/m     General appearance: alert, cooperative and appears stated age   Pelvic: External genitalia:  no lesions              Urethra:  normal appearing urethra with no masses, tenderness or lesions              Bartholins and Skenes: normal                 Vagina: normal appearing vagina with normal color and discharge.  She has a thin rim of posterior vaginal erythema that looks like granulation tissue.              Cervix: no lesions                Bimanual Exam:  Uterus:  normal size, contour, position, consistency, mobility, non-tender              Adnexa: no mass, fullness, tenderness         Gelhorn pessary placed.   Chaperone was present for exam.  ASSESSMENT  Incomplete uterovaginal prolapse.  Bladder pain.   PLAN  Continue pessary use.  Urine dip.  If positive will send for micro and  culture and start Bactrim DS po bid x 3 days.  She will have consultation with Dr. Maryland Pink, Urogyn from [facility] to discuss potential surgical care.    An After Visit Summary was printed and given to the patient.  __15____ minutes face to face time of which over 50% was spent in counseling.   Addendum Patient left office after being unable to give additional urine for a specimen.  She will be contacted to return tomorrow for a potential cath urine specimen.

## 2019-03-06 ENCOUNTER — Ambulatory Visit: Payer: Medicare Other | Admitting: Obstetrics and Gynecology

## 2019-03-06 ENCOUNTER — Telehealth: Payer: Self-pay

## 2019-03-06 ENCOUNTER — Encounter: Payer: Self-pay | Admitting: Obstetrics and Gynecology

## 2019-03-06 VITALS — BP 140/82 | HR 70 | Temp 97.4°F | Ht 70.0 in | Wt 237.0 lb

## 2019-03-06 DIAGNOSIS — R319 Hematuria, unspecified: Secondary | ICD-10-CM | POA: Diagnosis not present

## 2019-03-06 DIAGNOSIS — N3090 Cystitis, unspecified without hematuria: Secondary | ICD-10-CM

## 2019-03-06 DIAGNOSIS — R3989 Other symptoms and signs involving the genitourinary system: Secondary | ICD-10-CM | POA: Diagnosis not present

## 2019-03-06 LAB — POCT URINALYSIS DIPSTICK
Bilirubin, UA: NEGATIVE
Glucose, UA: NEGATIVE
Ketones, UA: NEGATIVE
Nitrite, UA: NEGATIVE
Protein, UA: NEGATIVE
Urobilinogen, UA: 0.2 E.U./dL
pH, UA: 5 (ref 5.0–8.0)

## 2019-03-06 MED ORDER — SULFAMETHOXAZOLE-TRIMETHOPRIM 800-160 MG PO TABS: 1.0000 | ORAL_TABLET | Freq: Two times a day (BID) | ORAL | 0 refills | Status: DC

## 2019-03-06 NOTE — Telephone Encounter (Signed)
Called patient and advised urine dip from late yesterday revealed 1+ RBCs and 2+ WBCs. Dr.Silva would like her to return to office for a cath urine. Made appointment for 12:45 today.

## 2019-03-06 NOTE — Progress Notes (Signed)
GYNECOLOGY  VISIT   HPI: 68 y.o.   Married  Caucasian  female   [phone] with Patient's last menstrual period was 04/26/1998 (within years).   here for cath urine.    Here for collection of urine specimen today.   Came in yesterday for placement of her pessary due to pelvic discomfort.  She is having bladder spasms.  She was unable to give an adequate urine specimen, and left the office following this.  She had declined catheterization yesterday.   She has a video chat on 03/14/19 with Dr. Rodena Piety, [place].   She feels the pessary more now.  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  Each were born prematurely.  One died shortly after birth and one died in his teens from complications related to CP.  Sterile urine dip:  1+ WBC and 2+ RBCs.   GYNECOLOGIC HISTORY: Patient's last menstrual period was 04/26/1998 (within years). Contraception:  Tubal/PMP Menopausal hormone therapy:  none Last mammogram: 02-12-19 3D/Neg/density B/BiRads1 Last pap smear: 4-5 years ago negative per patient        OB History    Gravida  4   Para  4   Term  0   Preterm  4   AB      Living  2     SAB      TAB      Ectopic      Multiple      Live Births  2              Patient Active Problem List   Diagnosis Date Noted  . Incomplete uterovaginal prolapse 11/10/2017  . Essential hypertension 02/16/2016  . Sciatic leg pain 02/16/2016    Past Medical History:  Diagnosis Date  . Cervical incompetence   . Hay fever   . Heart murmur   . Hemochromatosis   . Hypertension   . Migraines   . Sciatica     Past Surgical History:  Procedure Laterality Date  . CARPAL TUNNEL RELEASE Right   . COLONOSCOPY N/A 08/01/2014   Procedure: COLONOSCOPY;  Surgeon: Rogene Houston, MD;  Location: [facility];  Service: Endoscopy;  Laterality: N/A;  900 -- moved to 10:00 - Ann notified pt  . ELBOW SURGERY    . ROTATOR CUFF REPAIR Right 2012  . TONSILLECTOMY    . TUBAL LIGATION       Current Outpatient Medications  Medication Sig Dispense Refill  . diltiazem (TIAZAC) 180 MG 24 hr capsule Take 180 mg by mouth daily.    Marland Kitchen estradiol (ESTRACE) 0.1 MG/GM vaginal cream Use 1/2 g vaginally every night for the first 2 weeks, then use 1/2 g vaginally two or three times per week as needed to maintain symptom relief. 42.5 g 0  . fluticasone (FLONASE) 50 MCG/ACT nasal spray Place 1-2 sprays into both nostrils as needed for allergies or rhinitis.     . naproxen sodium (ANAPROX) 220 MG tablet Take 220-440 mg by mouth daily as needed (headaches).    . nystatin (MYCOSTATIN/NYSTOP) powder Apply topically 3 (three) times daily. Apply to affected area for up to 7 days 30 g 2  . olmesartan-hydrochlorothiazide (BENICAR HCT) 40-25 MG tablet     . omeprazole (PRILOSEC) 20 MG capsule Take 20 mg by mouth daily.   3  . rosuvastatin (CRESTOR) 5 MG tablet Take 5 mg by mouth daily.    Marland Kitchen zolpidem (AMBIEN) 10 MG tablet Take 5 mg by mouth at bedtime.  2  . sulfamethoxazole-trimethoprim (BACTRIM DS) 800-160 MG tablet Take 1 tablet by mouth 2 (two) times daily. One PO BID x 3 days 6 tablet 0   No current facility-administered medications for this visit.      ALLERGIES: Statins  Family History  Problem Relation Age of Onset  . Heart attack Paternal Grandfather   . Migraines Maternal Grandmother   . Heart attack Maternal Grandfather   . Heart attack Father   . Heart murmur Father   . Cancer Mother        pancreatic cancer  . Heart murmur Brother   . Breast cancer Maternal Aunt     Social History   Socioeconomic History  . Marital status: Married    Spouse name: Not on file  . Number of children: Not on file  . Years of education: Not on file  . Highest education level: Not on file  Occupational History  . Not on file  Social Needs  . Financial resource strain: Not on file  . Food insecurity    Worry: Not on file    Inability: Not on file  . Transportation needs    Medical: Not  on file    Non-medical: Not on file  Tobacco Use  . Smoking status: Former Smoker    Packs/day: 0.10    Types: Cigarettes    Start date: 09/16/1962    Quit date: 09/15/1969    Years since quitting: 49.5  . Smokeless tobacco: Never Used  . Tobacco comment: as a teenager  Substance and Sexual Activity  . Alcohol use: Yes    Comment:  1-2 month  . Drug use: No  . Sexual activity: Not Currently    Partners: Female    Birth control/protection: Post-menopausal    Comment: BTL  Lifestyle  . Physical activity    Days per week: Not on file    Minutes per session: Not on file  . Stress: Not on file  Relationships  . Social Herbalist on phone: Not on file    Gets together: Not on file    Attends religious service: Not on file    Active member of club or organization: Not on file    Attends meetings of clubs or organizations: Not on file    Relationship status: Not on file  . Intimate partner violence    Fear of current or ex partner: Not on file    Emotionally abused: Not on file    Physically abused: Not on file    Forced sexual activity: Not on file  Other Topics Concern  . Not on file  Social History Narrative  . Not on file    Review of Systems  All other systems reviewed and are negative.   PHYSICAL EXAMINATION:    BP 140/82 (Cuff Size: Large)   Pulse 70   Temp (!) 97.4 F (36.3 C) (Temporal)   Ht 5' 10"  (1.778 m)   Wt 237 lb (107.5 kg)   LMP 04/26/1998 (Within Years)   BMI 34.01 kg/m     General appearance: alert, cooperative and appears stated age    Pelvic: External genitalia:  no lesions              Urethra:  normal appearing urethra with no masses, tenderness or lesions            Sterile bladder catheterization Verbal consent for procedure.  Sterile prep with betadine.  I and O cath performed with  kit.  Urine dip, micro and culture.  No complications.              Chaperone was present for exam.  ASSESSMENT  Incomplete uterovaginal  prolapse.  Urinary incontinence.  Bladder spasms.  Cystitis.   PLAN  Bactrim DS po bid x 3 days.  Urine for micro and culture.  We talked about softer pessaries and surgical procedures including colpocleisis.  FU prn.    An After Visit Summary was printed and given to the patient.  __15____ minutes face to face time of which over 50% was spent in counseling.

## 2019-03-07 LAB — URINALYSIS, MICROSCOPIC ONLY: Casts: NONE SEEN /lpf

## 2019-03-10 ENCOUNTER — Telehealth: Payer: Self-pay | Admitting: Obstetrics and Gynecology

## 2019-03-10 LAB — URINE CULTURE

## 2019-03-10 MED ORDER — NITROFURANTOIN MONOHYD MACRO 100 MG PO CAPS: 100.0000 mg | ORAL_CAPSULE | Freq: Two times a day (BID) | ORAL | 0 refills | Status: DC

## 2019-03-10 NOTE — Telephone Encounter (Signed)
After hours phone call to patient regarding her UC result.  She has aerococcus urinae 50,000 - 100,000 colonies sens to PCN, amoxicillin, and nitrofurantoin with resistance to sulfonamides.   I prescribed Bactrim DS to her while she was in the office for her visit with bladder pain.  She states she has completed the Bactrim and is feeling better.   I prescribed Macrobid 100 mg po bid x 7 days and sent to her pharmacy.   She will call back for any problems or concerns.

## 2019-03-14 DIAGNOSIS — N390 Urinary tract infection, site not specified: Secondary | ICD-10-CM | POA: Diagnosis not present

## 2019-04-26 ENCOUNTER — Encounter: Payer: Self-pay | Admitting: Obstetrics and Gynecology

## 2019-04-26 ENCOUNTER — Ambulatory Visit (INDEPENDENT_AMBULATORY_CARE_PROVIDER_SITE_OTHER): Payer: Medicare Other | Admitting: Obstetrics and Gynecology

## 2019-04-26 ENCOUNTER — Other Ambulatory Visit: Payer: Self-pay

## 2019-04-26 VITALS — BP 122/78 | HR 80 | Temp 97.0°F | Ht 70.0 in | Wt 237.6 lb

## 2019-04-26 DIAGNOSIS — N3090 Cystitis, unspecified without hematuria: Secondary | ICD-10-CM | POA: Diagnosis not present

## 2019-04-26 DIAGNOSIS — R8290 Unspecified abnormal findings in urine: Secondary | ICD-10-CM | POA: Diagnosis not present

## 2019-04-26 DIAGNOSIS — R102 Pelvic and perineal pain: Secondary | ICD-10-CM

## 2019-04-26 LAB — POCT URINALYSIS DIPSTICK
Bilirubin, UA: NEGATIVE
Glucose, UA: NEGATIVE
Ketones, UA: NEGATIVE
Nitrite, UA: NEGATIVE
Protein, UA: NEGATIVE
Urobilinogen, UA: 0.2 E.U./dL
pH, UA: 5 (ref 5.0–8.0)

## 2019-04-26 MED ORDER — NITROFURANTOIN MONOHYD MACRO 100 MG PO CAPS: 100.0000 mg | ORAL_CAPSULE | Freq: Two times a day (BID) | ORAL | 0 refills | Status: DC

## 2019-04-26 MED ORDER — PHENAZOPYRIDINE HCL 200 MG PO TABS: 200.0000 mg | ORAL_TABLET | Freq: Three times a day (TID) | ORAL | 0 refills | Status: DC | PRN

## 2019-04-26 NOTE — Progress Notes (Signed)
GYNECOLOGY  VISIT   HPI: 68 y.o.   Married  Caucasian  female   [phone] with Patient's last menstrual period was 04/26/1998 (within years).   here for possible urinary tract infection. Patient is having bladder spasms. She's only voiding small amounts and doesn't feel like she's empty. She noticed a pink tinge to her urine.  Pessary is in.   No nausea or vomiting.  Has low back pain that comes and goes, which is ongoing, but more present now.  No fevers.   Patient was seen on 03/06/19 in our office and was treated for a UTI.   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  Patient did have televisit with Dr. Rodena Piety in 02/2019 and is scheduled for surgery 05/2019. She will do the surgery vaginally.   Urine Dip: 2+WBCs, 1+RBCs  GYNECOLOGIC HISTORY: Patient's last menstrual period was 04/26/1998 (within years). Contraception:  Tubal/PMP Menopausal hormone therapy:  none Last mammogram: 02-12-19 3D/Neg/density B/BiRads1 Last pap smear:  4-5 years ago negative per patient        OB History    Gravida  4   Para  4   Term  0   Preterm  4   AB      Living  2     SAB      TAB      Ectopic      Multiple      Live Births  2              Patient Active Problem List   Diagnosis Date Noted  . Incomplete uterovaginal prolapse 11/10/2017  . Essential hypertension 02/16/2016  . Sciatic leg pain 02/16/2016    Past Medical History:  Diagnosis Date  . Cervical incompetence   . Hay fever   . Heart murmur   . Hemochromatosis   . Hypertension   . Migraines   . Sciatica     Past Surgical History:  Procedure Laterality Date  . CARPAL TUNNEL RELEASE Right   . COLONOSCOPY N/A 08/01/2014   Procedure: COLONOSCOPY;  Surgeon: Rogene Houston, MD;  [facility];  Service: Endoscopy;  Laterality: N/A;  900 -- moved to 10:00 - Ann notified pt  . ELBOW SURGERY    . ROTATOR CUFF REPAIR Right 2012  . TONSILLECTOMY    .  TUBAL LIGATION      Current Outpatient Medications  Medication Sig Dispense Refill  . diltiazem (TIAZAC) 180 MG 24 hr capsule Take 180 mg by mouth daily.    . fluticasone (FLONASE) 50 MCG/ACT nasal spray Place 1-2 sprays into both nostrils as needed for allergies or rhinitis.     . naproxen sodium (ANAPROX) 220 MG tablet Take 220-440 mg by mouth daily as needed (headaches).    . nystatin (MYCOSTATIN/NYSTOP) powder Apply topically 3 (three) times daily. Apply to affected area for up to 7 days 30 g 2  . olmesartan-hydrochlorothiazide (BENICAR HCT) 40-25 MG tablet     . omeprazole (PRILOSEC) 20 MG capsule Take 20 mg by mouth daily.   3  . rosuvastatin (CRESTOR) 5 MG tablet Take 5 mg by mouth daily.    Marland Kitchen zolpidem (AMBIEN) 10 MG tablet Take 5 mg by mouth at bedtime.  2   No current facility-administered medications for this visit.     ALLERGIES: Statins  Family History  Problem Relation Age of Onset  . Heart attack Paternal Grandfather   . Migraines Maternal Grandmother   .  Heart attack Maternal Grandfather   . Heart attack Father   . Heart murmur Father   . Cancer Mother        pancreatic cancer  . Heart murmur Brother   . Breast cancer Maternal Aunt     Social History   Socioeconomic History  . Marital status: Married    Spouse name: Not on file  . Number of children: Not on file  . Years of education: Not on file  . Highest education level: Not on file  Occupational History  . Not on file  Tobacco Use  . Smoking status: Former Smoker    Packs/day: 0.10    Types: Cigarettes    Start date: 09/16/1962    Quit date: 09/15/1969    Years since quitting: 49.6  . Smokeless tobacco: Never Used  . Tobacco comment: as a teenager  Substance and Sexual Activity  . Alcohol use: Yes    Comment:  1-2 month  . Drug use: No  . Sexual activity: Not Currently    Partners: Female    Birth control/protection: Post-menopausal    Comment: BTL  Other Topics Concern  . Not on file   Social History Narrative  . Not on file   Social Determinants of Health   Financial Resource Strain:   . Difficulty of Paying Living Expenses: Not on file  Food Insecurity:   . Worried About Charity fundraiser in the Last Year: Not on file  . Ran Out of Food in the Last Year: Not on file  Transportation Needs:   . Lack of Transportation (Medical): Not on file  . Lack of Transportation (Non-Medical): Not on file  Physical Activity:   . Days of Exercise per Week: Not on file  . Minutes of Exercise per Session: Not on file  Stress:   . Feeling of Stress : Not on file  Social Connections:   . Frequency of Communication with Friends and Family: Not on file  . Frequency of Social Gatherings with Friends and Family: Not on file  . Attends Religious Services: Not on file  . Active Member of Clubs or Organizations: Not on file  . Attends Archivist Meetings: Not on file  . Marital Status: Not on file  Intimate Partner Violence:   . Fear of Current or Ex-Partner: Not on file  . Emotionally Abused: Not on file  . Physically Abused: Not on file  . Sexually Abused: Not on file    Review of Systems  Genitourinary:       Bladder spasms Voiding small amounts    PHYSICAL EXAMINATION:    BP 122/78 (Cuff Size: Large)   Pulse 80   Temp (!) 97 F (36.1 C) (Temporal)   Ht 5\' 10"  (1.778 m)   Wt 237 lb 9.6 oz (107.8 kg)   LMP 04/26/1998 (Within Years)   BMI 34.09 kg/m     General appearance: alert, cooperative and appears stated age  ASSESSMENT  Cystitis. Incomplete uterovaginal prolapse.  Pessary use.   PLAN  Urine micro and culture.  Macrobid 100 mg po bid x 7 days.  Pyridium 200 mg po tid x 3 days prn. She understands that the Pyridium can cause staining of her urine so she will use extra incontinence protection. She will go to urgent care during the New Year's weekend if no improvement in 48 hours.    An After Visit Summary was printed and given to the  patient.  __15____ minutes face to  face time of which over 50% was spent in counseling.

## 2019-04-27 LAB — URINALYSIS, MICROSCOPIC ONLY
Casts: NONE SEEN /lpf
Epithelial Cells (non renal): >10 /hpf — AB (ref 0–10)
WBC, UA: >30 /hpf — AB (ref 0–5)

## 2019-04-28 LAB — URINE CULTURE

## 2019-05-31 DIAGNOSIS — Z1389 Encounter for screening for other disorder: Secondary | ICD-10-CM | POA: Diagnosis not present

## 2019-05-31 DIAGNOSIS — I10 Essential (primary) hypertension: Secondary | ICD-10-CM | POA: Diagnosis not present

## 2019-05-31 DIAGNOSIS — Z01812 Encounter for preprocedural laboratory examination: Secondary | ICD-10-CM | POA: Diagnosis not present

## 2019-05-31 DIAGNOSIS — R7309 Other abnormal glucose: Secondary | ICD-10-CM | POA: Diagnosis not present

## 2019-05-31 DIAGNOSIS — E119 Type 2 diabetes mellitus without complications: Secondary | ICD-10-CM | POA: Diagnosis not present

## 2019-05-31 DIAGNOSIS — E7849 Other hyperlipidemia: Secondary | ICD-10-CM | POA: Diagnosis not present

## 2019-06-06 DIAGNOSIS — Z6834 Body mass index (BMI) 34.0-34.9, adult: Secondary | ICD-10-CM | POA: Insufficient documentation

## 2019-06-06 DIAGNOSIS — N816 Rectocele: Secondary | ICD-10-CM | POA: Diagnosis not present

## 2019-06-07 DIAGNOSIS — Z01812 Encounter for preprocedural laboratory examination: Secondary | ICD-10-CM | POA: Diagnosis not present

## 2019-07-05 DIAGNOSIS — Z01812 Encounter for preprocedural laboratory examination: Secondary | ICD-10-CM | POA: Diagnosis not present

## 2019-07-12 DIAGNOSIS — Z87891 Personal history of nicotine dependence: Secondary | ICD-10-CM | POA: Diagnosis not present

## 2019-07-12 DIAGNOSIS — I10 Essential (primary) hypertension: Secondary | ICD-10-CM | POA: Diagnosis not present

## 2019-07-12 DIAGNOSIS — N952 Postmenopausal atrophic vaginitis: Secondary | ICD-10-CM | POA: Diagnosis not present

## 2019-07-13 DIAGNOSIS — N952 Postmenopausal atrophic vaginitis: Secondary | ICD-10-CM | POA: Diagnosis not present

## 2019-07-13 DIAGNOSIS — Z87891 Personal history of nicotine dependence: Secondary | ICD-10-CM | POA: Diagnosis not present

## 2019-07-13 DIAGNOSIS — I10 Essential (primary) hypertension: Secondary | ICD-10-CM | POA: Diagnosis not present

## 2019-07-25 DIAGNOSIS — R8290 Unspecified abnormal findings in urine: Secondary | ICD-10-CM | POA: Diagnosis not present

## 2019-08-14 DIAGNOSIS — H43393 Other vitreous opacities, bilateral: Secondary | ICD-10-CM | POA: Diagnosis not present

## 2019-08-22 DIAGNOSIS — N398 Other specified disorders of urinary system: Secondary | ICD-10-CM | POA: Diagnosis not present

## 2019-08-22 DIAGNOSIS — R82998 Other abnormal findings in urine: Secondary | ICD-10-CM | POA: Diagnosis not present

## 2019-08-31 DIAGNOSIS — D485 Neoplasm of uncertain behavior of skin: Secondary | ICD-10-CM | POA: Diagnosis not present

## 2019-08-31 DIAGNOSIS — L570 Actinic keratosis: Secondary | ICD-10-CM | POA: Diagnosis not present

## 2019-08-31 DIAGNOSIS — L821 Other seborrheic keratosis: Secondary | ICD-10-CM | POA: Diagnosis not present

## 2019-08-31 DIAGNOSIS — D2262 Melanocytic nevi of left upper limb, including shoulder: Secondary | ICD-10-CM | POA: Diagnosis not present

## 2019-08-31 DIAGNOSIS — D2261 Melanocytic nevi of right upper limb, including shoulder: Secondary | ICD-10-CM | POA: Diagnosis not present

## 2019-08-31 DIAGNOSIS — D225 Melanocytic nevi of trunk: Secondary | ICD-10-CM | POA: Diagnosis not present

## 2019-09-14 DIAGNOSIS — D485 Neoplasm of uncertain behavior of skin: Secondary | ICD-10-CM | POA: Diagnosis not present

## 2019-09-14 DIAGNOSIS — L988 Other specified disorders of the skin and subcutaneous tissue: Secondary | ICD-10-CM | POA: Diagnosis not present

## 2019-09-20 DIAGNOSIS — R7309 Other abnormal glucose: Secondary | ICD-10-CM | POA: Diagnosis not present

## 2019-09-20 DIAGNOSIS — Z0001 Encounter for general adult medical examination with abnormal findings: Secondary | ICD-10-CM | POA: Diagnosis not present

## 2019-09-20 DIAGNOSIS — E7849 Other hyperlipidemia: Secondary | ICD-10-CM | POA: Diagnosis not present

## 2019-09-20 DIAGNOSIS — Z1389 Encounter for screening for other disorder: Secondary | ICD-10-CM | POA: Diagnosis not present

## 2019-09-20 DIAGNOSIS — G4700 Insomnia, unspecified: Secondary | ICD-10-CM | POA: Diagnosis not present

## 2019-09-20 DIAGNOSIS — E119 Type 2 diabetes mellitus without complications: Secondary | ICD-10-CM | POA: Diagnosis not present

## 2019-12-06 DIAGNOSIS — L570 Actinic keratosis: Secondary | ICD-10-CM | POA: Diagnosis not present

## 2019-12-06 DIAGNOSIS — L821 Other seborrheic keratosis: Secondary | ICD-10-CM | POA: Diagnosis not present

## 2019-12-06 DIAGNOSIS — L820 Inflamed seborrheic keratosis: Secondary | ICD-10-CM | POA: Diagnosis not present

## 2019-12-11 DIAGNOSIS — E119 Type 2 diabetes mellitus without complications: Secondary | ICD-10-CM | POA: Diagnosis not present

## 2019-12-11 DIAGNOSIS — E7849 Other hyperlipidemia: Secondary | ICD-10-CM | POA: Diagnosis not present

## 2019-12-11 DIAGNOSIS — R011 Cardiac murmur, unspecified: Secondary | ICD-10-CM | POA: Diagnosis not present

## 2019-12-11 DIAGNOSIS — I10 Essential (primary) hypertension: Secondary | ICD-10-CM | POA: Diagnosis not present

## 2020-02-07 DIAGNOSIS — N342 Other urethritis: Secondary | ICD-10-CM | POA: Diagnosis not present

## 2020-02-07 DIAGNOSIS — N390 Urinary tract infection, site not specified: Secondary | ICD-10-CM | POA: Diagnosis not present

## 2020-05-21 DIAGNOSIS — R011 Cardiac murmur, unspecified: Secondary | ICD-10-CM | POA: Diagnosis not present

## 2020-05-21 DIAGNOSIS — Z1389 Encounter for screening for other disorder: Secondary | ICD-10-CM | POA: Diagnosis not present

## 2020-05-21 DIAGNOSIS — E119 Type 2 diabetes mellitus without complications: Secondary | ICD-10-CM | POA: Diagnosis not present

## 2020-05-21 DIAGNOSIS — R002 Palpitations: Secondary | ICD-10-CM | POA: Diagnosis not present

## 2020-05-21 DIAGNOSIS — E7849 Other hyperlipidemia: Secondary | ICD-10-CM | POA: Diagnosis not present

## 2020-05-21 DIAGNOSIS — E782 Mixed hyperlipidemia: Secondary | ICD-10-CM | POA: Diagnosis not present

## 2020-05-21 DIAGNOSIS — Z0001 Encounter for general adult medical examination with abnormal findings: Secondary | ICD-10-CM | POA: Diagnosis not present

## 2020-06-05 NOTE — Progress Notes (Signed)
CARDIOLOGY CONSULT NOTE       Patient ID: RONAE NOELL MRN: [id number] DOB/AGE: [birth year] 70 y.o.  Admit date: (Not on file) Referring Physician: [name] Primary Physician: [name], MD Primary Cardiologist: [name] last seen 12/13/17 Reason for Consultation: PVC/Murmur    HPI:  70 y.o. 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 Cervical incompetence   . Hay fever   . Heart murmur   . Hemochromatosis   . Hypertension   . Migraines   . Sciatica     Family History  Problem Relation Age of Onset  . Heart attack Paternal Grandfather   . Migraines Maternal Grandmother   . Heart attack Maternal Grandfather   . Heart attack Father   . Heart murmur Father   . Cancer Mother        pancreatic cancer  . Heart murmur Brother   . Breast cancer Maternal Aunt     Social History   Socioeconomic History  . Marital status: Married    Spouse name: Not on file  . Number of children: Not on file  . Years of education: Not on file  . Highest education level: Not on file  Occupational History  . Not on file  Tobacco Use  . Smoking status: Former Smoker    Packs/day: 0.10    Types: Cigarettes    Start date: 09/16/1962    Quit date: 09/15/1969    Years since quitting:  50.7  . Smokeless tobacco: Never Used  . Tobacco comment: as a teenager  Vaping Use  . Vaping Use: Never used  Substance and Sexual Activity  . Alcohol use: Yes    Comment:  1-2 month  . Drug use: No  . Sexual activity: Not Currently    Partners: Female    Birth control/protection: Post-menopausal    Comment: BTL  Other Topics Concern  . Not on file  Social History Narrative  . Not on file   Social Determinants of Health   Financial Resource Strain: Not on file  Food Insecurity: Not on file  Transportation Needs: Not on file  Physical Activity: Not on file  Stress: Not on file  Social Connections: Not on file  Intimate Partner Violence: Not on file    Past Surgical History:  Procedure Laterality Date  . CARPAL TUNNEL RELEASE Right   . COLONOSCOPY N/A 08/01/2014   Procedure: COLONOSCOPY;  Surgeon: Rogene Houston, MD;  [facility];  Service: Endoscopy;  Laterality: N/A;  900 -- moved to 10:00 - Ann notified pt  . ELBOW SURGERY    . ROTATOR CUFF REPAIR Right 2012  . TONSILLECTOMY    . TUBAL LIGATION  Current Outpatient Medications:  .  diltiazem (TIAZAC) 180 MG 24 hr capsule, Take 180 mg by mouth daily., Disp: , Rfl:  .  fluticasone (FLONASE) 50 MCG/ACT nasal spray, Place 1-2 sprays into both nostrils as needed for allergies or rhinitis. , Disp: , Rfl:  .  metFORMIN (GLUCOPHAGE) 500 MG tablet, Take 500 mg by mouth 2 (two) times daily., Disp: , Rfl:  .  naproxen sodium (ANAPROX) 220 MG tablet, Take 220-440 mg by mouth daily as needed (headaches)., Disp: , Rfl:  .  nystatin (MYCOSTATIN/NYSTOP) powder, Apply topically 3 (three) times daily. Apply to affected area for up to 7 days, Disp: 30 g, Rfl: 2 .  olmesartan-hydrochlorothiazide (BENICAR HCT) 40-25 MG tablet, , Disp: , Rfl:  .  omeprazole (PRILOSEC) 20 MG capsule, Take 20 mg by mouth daily. , Disp: , Rfl: 3 .  propranolol (INDERAL) 10 MG tablet, Daily as needed, Disp: 90 tablet, Rfl: 3 .  rosuvastatin  (CRESTOR) 5 MG tablet, Take 5 mg by mouth daily., Disp: , Rfl:  .  zolpidem (AMBIEN) 10 MG tablet, Take 5 mg by mouth at bedtime., Disp: , Rfl: 2    Physical Exam: Blood pressure (!) 142/90, pulse (!) 101, height 5\' 9"  (1.753 m), weight 108.2 kg, last menstrual period 04/26/1998, SpO2 96 %.    Affect appropriate Healthy:  appears stated age 70: normal Neck supple with no adenopathy JVP normal no bruits no thyromegaly Lungs clear with no wheezing and good diaphragmatic motion Heart:  S1/S2 SEM slightly worse with valsalva , no rub, gallop or click PMI normal Abdomen: benighn, BS positve, no tenderness, no AAA no bruit.  No HSM or HJR Distal pulses intact with no bruits No edema Neuro non-focal Skin warm and dry No muscular weakness   Labs:   Lab Results  Component Value Date   WBC 9.2 11/15/2016   HGB 14.0 01/19/2017   HCT 41.0 01/19/2017   MCV 90.0 11/15/2016   PLT 281 11/15/2016      Radiology: No results found.  EKG: 2019 SR rate 90 normal QT poor R wave progression 05/21/20 SR rate 68 LAD poor R wave progression 06/13/2020 SR rate 76 LVH    ASSESSMENT AND PLAN:   1. PVC:  Benign no high risk family history resolved for most part PRN inderal  2. Abnormal echo :  Suggestive of HOCM vs HTN/sigmoid septal changes from age 70 Will start with f/u echo and if significantly abnormal or LVOT gradient consider cardiac MRI  3. HTN:  Well controlled.  Continue current medications and low sodium Dash type diet.   4. HLD:  On statin labs with primary    PRN inderal Echo  F/U 6 months   Signed: Jenkins Rouge 06/13/2020, 11:39 AM

## 2020-06-13 ENCOUNTER — Other Ambulatory Visit: Payer: Self-pay

## 2020-06-13 ENCOUNTER — Ambulatory Visit: Payer: Medicare Other | Admitting: Cardiovascular Disease

## 2020-06-13 ENCOUNTER — Encounter: Payer: Self-pay | Admitting: Cardiovascular Disease

## 2020-06-13 VITALS — BP 142/90 | HR 101 | Ht 69.0 in | Wt 238.6 lb

## 2020-06-13 DIAGNOSIS — R011 Cardiac murmur, unspecified: Secondary | ICD-10-CM

## 2020-06-13 DIAGNOSIS — R002 Palpitations: Secondary | ICD-10-CM | POA: Diagnosis not present

## 2020-06-13 DIAGNOSIS — I10 Essential (primary) hypertension: Secondary | ICD-10-CM

## 2020-06-13 MED ORDER — PROPRANOLOL HCL 10 MG PO TABS: ORAL_TABLET | ORAL | 3 refills | Status: DC

## 2020-06-13 NOTE — Patient Instructions (Signed)
Medication Instructions:  START Indurall as needed for palpitations. *If you need a refill on your cardiac medications before your next appointment, please call your pharmacy*   Lab Work: None If you have labs (blood work) drawn today and your tests are completely normal, you will receive your results only by: Marland Kitchen MyChart Message (if you have MyChart) OR . A paper copy in the mail If you have any lab test that is abnormal or we need to change your treatment, we will call you to review the results.   Testing/Procedures: Your physician has requested that you have an echocardiogram. Echocardiography is a painless test that uses sound waves to create images of your heart. It provides your doctor with information about the size and shape of your heart and how well your heart's chambers and valves are working. This procedure takes approximately one hour. There are no restrictions for this procedure.     Follow-Up: At [facility], you and your health needs are our priority.  As part of our continuing mission to provide you with exceptional heart care, we have created designated Provider Care Teams.  These Care Teams include your primary Cardiologist (physician) and Advanced Practice Providers (APPs -  Physician Assistants and Nurse Practitioners) who all work together to provide you with the care you need, when you need it.  We recommend signing up for the patient portal called "MyChart".  Sign up information is provided on this After Visit Summary.  MyChart is used to connect with patients for Virtual Visits (Telemedicine).  Patients are able to view lab/test results, encounter notes, upcoming appointments, etc.  Non-urgent messages can be sent to your provider as well.   To learn more about what you can do with MyChart, go to [email or web address].    Your next appointment:   6 month(s)  The format for your next appointment:   In Person  Provider:   Jenkins Rouge, MD   Other  Instructions None Today

## 2020-06-27 ENCOUNTER — Other Ambulatory Visit: Payer: Self-pay

## 2020-06-27 ENCOUNTER — Ambulatory Visit (HOSPITAL_COMMUNITY)
Admission: RE | Admit: 2020-06-27 | Discharge: 2020-06-27 | Disposition: A | Discharge status: Home / Self Care | Payer: Medicare Other | Source: Ambulatory Visit | Attending: Cardiovascular Disease | Admitting: Cardiovascular Disease

## 2020-06-27 DIAGNOSIS — R002 Palpitations: Secondary | ICD-10-CM | POA: Diagnosis not present

## 2020-06-27 DIAGNOSIS — I352 Nonrheumatic aortic (valve) stenosis with insufficiency: Secondary | ICD-10-CM | POA: Insufficient documentation

## 2020-06-27 DIAGNOSIS — I119 Hypertensive heart disease without heart failure: Secondary | ICD-10-CM | POA: Insufficient documentation

## 2020-06-27 DIAGNOSIS — I350 Nonrheumatic aortic (valve) stenosis: Secondary | ICD-10-CM

## 2020-06-27 LAB — ECHOCARDIOGRAM COMPLETE
AR max vel: 4.35 cm2
AV Area VTI: 4.7 cm2
AV Area mean vel: 4.18 cm2
AV Mean grad: 36.3 mmHg
AV Peak grad: 58.8 mmHg
Ao pk vel: 3.83 m/s
Area-P 1/2: 1.5 cm2
MV VTI: 5.04 cm2
P 1/2 time: 509 msec
S' Lateral: 2.8 cm

## 2020-06-27 NOTE — Progress Notes (Signed)
*  PRELIMINARY RESULTS* Echocardiogram 2D Echocardiogram has been performed.  [name] 06/27/2020, 2:00 PM

## 2020-07-16 ENCOUNTER — Ambulatory Visit: Payer: Medicare Other | Admitting: Student

## 2020-07-16 ENCOUNTER — Other Ambulatory Visit: Payer: Self-pay

## 2020-07-16 ENCOUNTER — Encounter: Payer: Self-pay | Admitting: Student

## 2020-07-16 VITALS — BP 146/84 | HR 70 | Ht 70.0 in | Wt 236.0 lb

## 2020-07-16 DIAGNOSIS — I10 Essential (primary) hypertension: Secondary | ICD-10-CM

## 2020-07-16 DIAGNOSIS — I350 Nonrheumatic aortic (valve) stenosis: Secondary | ICD-10-CM

## 2020-07-16 DIAGNOSIS — Z01818 Encounter for other preprocedural examination: Secondary | ICD-10-CM | POA: Diagnosis not present

## 2020-07-16 NOTE — Patient Instructions (Signed)
Medication Instructions:  Your physician recommends that you continue on your current medications as directed. Please refer to the Current Medication list given to you today.  Hold HCTZ and Metformin for the Heart Cath   *If you need a refill on your cardiac medications before your next appointment, please call your pharmacy*   Lab Work: Your physician recommends that you return for lab work in: Today   If you have labs (blood work) drawn today and your tests are completely normal, you will receive your results only by: Marland Kitchen MyChart Message (if you have MyChart) OR . A paper copy in the mail If you have any lab test that is abnormal or we need to change your treatment, we will call you to review the results.   Testing/Procedures: Your physician has requested that you have a cardiac catheterization. Cardiac catheterization is used to diagnose and/or treat various heart conditions. Doctors may recommend this procedure for a number of different reasons. The most common reason is to evaluate chest pain. Chest pain can be a symptom of coronary artery disease (CAD), and cardiac catheterization can show whether plaque is narrowing or blocking your heart's arteries. This procedure is also used to evaluate the valves, as well as measure the blood flow and oxygen levels in different parts of your heart. For further information please visit [email or web address]. Please follow instruction sheet, as given.  Follow-Up: At [facility], you and your health needs are our priority.  As part of our continuing mission to provide you with exceptional heart care, we have created designated Provider Care Teams.  These Care Teams include your primary Cardiologist (physician) and Advanced Practice Providers (APPs -  Physician Assistants and Nurse Practitioners) who all work together to provide you with the care you need, when you need it.  We recommend signing up for the patient portal called "MyChart".  Sign up  information is provided on this After Visit Summary.  MyChart is used to connect with patients for Virtual Visits (Telemedicine).  Patients are able to view lab/test results, encounter notes, upcoming appointments, etc.  Non-urgent messages can be sent to your provider as well.   To learn more about what you can do with MyChart, go to [email or web address].    Your next appointment:    Pending Heart Cath   The format for your next appointment:   In Person  Provider:   Jenkins Rouge, MD   Other Instructions Thank you for choosing [facility]!     [place] [address] Dept: [phone] Loc: [facility]  07/16/2020  You are scheduled for a Cardiac Catheterization on Monday, April 4 with Dr. Sherren Mocha.  1. Please arrive at the [facility] (Main Entrance A) at [facility]: [address] [place], [place] [address] at 10:00 AM (This time is two hours before your procedure to ensure your preparation). Free valet parking service is available.   Special note: Every effort is made to have your procedure done on time. Please understand that emergencies sometimes delay scheduled procedures.  2. Diet: Do not eat solid foods after midnight.  The patient may have clear liquids until 5am upon the day of the procedure.  3. Labs: You will need to have blood drawn on Today, at [place] do not need to be fasting.  4. Medication instructions in preparation for your procedure:   Contrast Allergy: No  Do not take HCTZ the morning of your procedure.  Do not take Metformin the day before or the day of your procedure.   On the morning of your procedure, take your Aspirin and any morning medicines NOT listed above.  You may use sips of water.  5. Plan for one night stay--bring personal belongings. 6. Bring a current list of your medications  and current insurance cards. 7. You MUST have a responsible person to drive you home. 8. Someone MUST be with you the first 24 hours after you arrive home or your discharge will be delayed. 9. Please wear clothes that are easy to get on and off and wear slip-on shoes.  Thank you for allowing [place] to care for you!   -- [facility]

## 2020-07-16 NOTE — Progress Notes (Signed)
Cardiology Office Note    Date:  07/16/2020   ID:  [name], DOB Feb 10, 1951, MRN [id number]  PCP:  [name], MD  Cardiologist: Jenkins Rouge, MD    Chief Complaint  Patient presents with  . Follow-up    1 month visit    History of Present Illness:    [name] is a 70 y.o. female with past medical history of aortic stenosis, HTN and GERD who presents to the office today for 26-month follow-up.   She was examined by Dr. Johnsie Cancel in 05/2020 and denied any recent chest pain or palpitations but did have a significant murmur on examination and an echocardiogram was recommended for initial assessment with consideration of a cardiac MRI if significantly abnormal given concerns for HOCM based off prior imaging.   Her follow-up echocardiogram showed a preserved EF of 65-70% with severe asymmetric LVH of the septal segment along with Grade 1 DD. LVOT was small with evidence of dynamic outflow obstruction (mean gradient 59 mmHg with Valsalva). Was noted to have mild MR, mild MS and severe aortic stenosis based on planimetry and mean gradient, although there was dynamic outflow gradient in the LVOT. Dr. Johnsie Cancel recommended she have a R/LHC for further evaluation and a follow-up visit was arranged for further discussion.   In talking with the patient today, she reports having baseline dyspnea on exertion and she typically notices this mostly when walking her dogs but denies any acute change in her symptoms. No recent chest pain or palpitations. No dizziness or presyncope. She denies any recent orthopnea, PND or lower extremity edema. She has been concerned about her echocardiogram results since reviewing these on MyChart.   Past Medical History:  Diagnosis Date  . Cervical incompetence   . Hay fever   . Heart murmur   . Hemochromatosis   . Hypertension   . Migraines   . Sciatica     Past Surgical History:  Procedure Laterality Date  . CARPAL TUNNEL RELEASE Right   . COLONOSCOPY  N/A 08/01/2014   Procedure: COLONOSCOPY;  Surgeon: Rogene Houston, MD;  Location: [facility];  Service: Endoscopy;  Laterality: N/A;  900 -- moved to 10:00 - Ann notified pt  . ELBOW SURGERY    . ROTATOR CUFF REPAIR Right 2012  . TONSILLECTOMY    . TUBAL LIGATION      Current Medications: Outpatient Medications Prior to Visit  Medication Sig Dispense Refill  . diltiazem (TIAZAC) 180 MG 24 hr capsule Take 180 mg by mouth daily.    . fluticasone (FLONASE) 50 MCG/ACT nasal spray Place 1-2 sprays into both nostrils as needed for allergies or rhinitis.     . metFORMIN (GLUCOPHAGE) 500 MG tablet Take 500 mg by mouth 2 (two) times daily.    . naproxen sodium (ANAPROX) 220 MG tablet Take 220-440 mg by mouth daily as needed (headaches).    . olmesartan-hydrochlorothiazide (BENICAR HCT) 40-25 MG tablet     . omeprazole (PRILOSEC) 20 MG capsule Take 20 mg by mouth daily.   3  . propranolol (INDERAL) 10 MG tablet Daily as needed 90 tablet 3  . rosuvastatin (CRESTOR) 5 MG tablet Take 5 mg by mouth daily.    Marland Kitchen zolpidem (AMBIEN) 10 MG tablet Take 5 mg by mouth at bedtime.  2  . nystatin (MYCOSTATIN/NYSTOP) powder Apply topically 3 (three) times daily. Apply to affected area for up to 7 days 30 g 2   No facility-administered medications prior to visit.  Allergies:   Patient has no known allergies.   Social History   Socioeconomic History  . Marital status: Married    Spouse name: Not on file  . Number of children: Not on file  . Years of education: Not on file  . Highest education level: Not on file  Occupational History  . Not on file  Tobacco Use  . Smoking status: Former Smoker    Packs/day: 0.10    Types: Cigarettes    Start date: 09/16/1962    Quit date: 09/15/1969    Years since quitting: 50.8  . Smokeless tobacco: Never Used  . Tobacco comment: as a teenager  Vaping Use  . Vaping Use: Never used  Substance and Sexual Activity  . Alcohol use: Not Currently    Comment:  1-2  month  . Drug use: No  . Sexual activity: Not Currently    Partners: Female    Birth control/protection: Post-menopausal    Comment: BTL  Other Topics Concern  . Not on file  Social History Narrative  . Not on file   Social Determinants of Health   Financial Resource Strain: Not on file  Food Insecurity: Not on file  Transportation Needs: Not on file  Physical Activity: Not on file  Stress: Not on file  Social Connections: Not on file     Family History:  The patient's family history includes Breast cancer in her maternal aunt; Cancer in her mother; Heart attack in her father, maternal grandfather, and paternal grandfather; Heart murmur in her brother and father; Migraines in her maternal grandmother.   Review of Systems:   Please see the history of present illness.     General:  No chills, fever, night sweats or weight changes.  Cardiovascular:  No chest pain, edema, orthopnea, palpitations, paroxysmal nocturnal dyspnea. Positive for dyspnea on exertion ( at baseline).  Dermatological: No rash, lesions/masses Respiratory: No cough, dyspnea Urologic: No hematuria, dysuria Abdominal:   No nausea, vomiting, diarrhea, bright red blood per rectum, melena, or hematemesis Neurologic:  No visual changes, wkns, changes in mental status. All other systems reviewed and are otherwise negative except as noted above.   Physical Exam:    VS:  BP (!) 146/84   Pulse 70   Ht 5\' 10"  (1.778 m)   Wt 236 lb (107 kg)   LMP 04/26/1998 (Within Years)   SpO2 96%   BMI 33.86 kg/m    General: Well developed, well nourished,female appearing in no acute distress. Head: Normocephalic, atraumatic. Neck: No carotid bruits. JVD not elevated.  Lungs: Respirations regular and unlabored, without wheezes or rales.  Heart: Regular rate and rhythm. No S3 or S4. 3/6 SEM along RUSB.  Abdomen: Appears non-distended. No obvious abdominal masses. Msk:  Strength and tone appear normal for age. No obvious  joint deformities or effusions. Extremities: No clubbing or cyanosis. No edema.  Distal pedal pulses are 2+ bilaterally. Neuro: Alert and oriented X 3. Moves all extremities spontaneously. No focal deficits noted. Psych:  Responds to questions appropriately with a normal affect. Skin: No rashes or lesions noted  Wt Readings from Last 3 Encounters:  07/16/20 236 lb (107 kg)  06/13/20 238 lb 9.6 oz (108.2 kg)  04/26/19 237 lb 9.6 oz (107.8 kg)     Studies/Labs Reviewed:   EKG:  EKG is ordered today.  The ekg ordered today demonstrates NSR, HR 66 with no acute ST changes when compared to prior tracings.   Recent Labs: No results found for  requested labs within last 8760 hours.   Lipid Panel No results found for: CHOL, TRIG, HDL, CHOLHDL, VLDL, LDLCALC, LDLDIRECT  Additional studies/ records that were reviewed today include:   Echocardiogram: 06/2020 IMPRESSIONS    1. Left ventricular ejection fraction, by estimation, is 65 to 70%. The  left ventricle has normal function. The left ventricle has no regional  wall motion abnormalities. There is severe asymmetric left ventricular  hypertrophy of the septal segment. Left  ventricular diastolic parameters are consistent with Grade I diastolic  dysfunction (impaired relaxation). LVOT is small with evidence of dynamic  outflow obstruction (mean gradient 59 mmHg with Valsalva).  2. Right ventricular systolic function is normal. The right ventricular  size is normal. There is normal pulmonary artery systolic pressure. The  estimated right ventricular systolic pressure is 44.9 mmHg.  3. Left atrial size was moderately dilated.  4. The mitral valve is abnormal. Mild mitral valve regurgitation. Mild  mitral stenosis. The mean mitral valve gradient is 6.0 mmHg. Moderate  mitral annular calcification.  5. The aortic valve is tricuspid. There is moderate calcification of the  aortic valve. Aortic valve regurgitation is mild. Severe  aortic valve  stenosis based on planimetry and mean gradient, although there is also  dynamic outflow gradient in the LVOT.  Aortic regurgitation PHT measures 509 msec. Aortic valve mean gradient  measures 36.2 mmHg.  6. The inferior vena cava is normal in size with greater than 50%  respiratory variability, suggesting right atrial pressure of 3 mmHg.   Assessment:    1. Nonrheumatic aortic valve stenosis   2. Pre-op evaluation   3. Essential hypertension      Plan:   In order of problems listed above:  1. Aortic Stenosis - Recent echocardiogram showed a preserved EF of 65-70% with severe asymmetric LVH of the septal segment and LVOT was small with evidence of dynamic outflow obstruction but was also noted to have severe aortic stenosis based on planimetry and mean gradient and Dr. Johnsie Cancel recommended a cardiac catheterization for further evaluation.  - Reviewed a catheterization with the patient today and she is in agreement to proceed. Will see if this can be arranged with Dr. Burt Knack or Dr. Angelena Form given her AS and possibility of TAVR (patient stated she would never have open-heart surgery). The patient understands that risks include but are not limited to stroke (1 in 1000), death (1 in 24), kidney failure [usually temporary] (1 in 500), bleeding (1 in 200), allergic reaction [possibly serious] (1 in 200). May need cardiac MRI pending cath results. Will arrange for pre-procedure CBC and BMET. Will also send a note to the Structural Heart coordinator today so they are aware of her upcoming cath.   2. HTN - BP is elevated at 146/84 during today's visit. Remains on Cardizem CD 180mg  daily and Olmesartan-HCTZ 40-25mg  daily. Continue current regimen for now. May require further adjustment in the future but will wait until after her cath.    Medication Adjustments/Labs and Tests Ordered: Current medicines are reviewed at length with the patient today.  Concerns regarding medicines are  outlined above.  Medication changes, Labs and Tests ordered today are listed in the Patient Instructions below. Patient Instructions  Medication Instructions:  Your physician recommends that you continue on your current medications as directed. Please refer to the Current Medication list given to you today.  Hold HCTZ and Metformin for the Heart Cath   *If you need a refill on your cardiac medications before your next  appointment, please call your pharmacy*   Lab Work: Your physician recommends that you return for lab work in: Today   If you have labs (blood work) drawn today and your tests are completely normal, you will receive your results only by: Marland Kitchen MyChart Message (if you have MyChart) OR . A paper copy in the mail If you have any lab test that is abnormal or we need to change your treatment, we will call you to review the results.   Testing/Procedures: Your physician has requested that you have a cardiac catheterization. Cardiac catheterization is used to diagnose and/or treat various heart conditions. Doctors may recommend this procedure for a number of different reasons. The most common reason is to evaluate chest pain. Chest pain can be a symptom of coronary artery disease (CAD), and cardiac catheterization can show whether plaque is narrowing or blocking your heart's arteries. This procedure is also used to evaluate the valves, as well as measure the blood flow and oxygen levels in different parts of your heart. For further information please visit [email or web address]. Please follow instruction sheet, as given.  Follow-Up: At [facility], you and your health needs are our priority.  As part of our continuing mission to provide you with exceptional heart care, we have created designated Provider Care Teams.  These Care Teams include your primary Cardiologist (physician) and Advanced Practice Providers (APPs -  Physician Assistants and Nurse Practitioners) who all work together to  provide you with the care you need, when you need it.  We recommend signing up for the patient portal called "MyChart".  Sign up information is provided on this After Visit Summary.  MyChart is used to connect with patients for Virtual Visits (Telemedicine).  Patients are able to view lab/test results, encounter notes, upcoming appointments, etc.  Non-urgent messages can be sent to your provider as well.   To learn more about what you can do with MyChart, go to [email or web address].    Your next appointment:    Pending Heart Cath   The format for your next appointment:   In Person  Provider:   Jenkins Rouge, MD   Other Instructions Thank you for choosing [facility]!     [address] [place] [address] Dept: [phone] Loc: [name]  07/16/2020  You are scheduled for a Cardiac Catheterization on Monday, April 4 with Dr. Sherren Mocha.  1. Please arrive at the [facility] (Main Entrance A) at [facility]: [address] [place], [place] [address] at 10:00 AM (This time is two hours before your procedure to ensure your preparation). Free valet parking service is available.   Special note: Every effort is made to have your procedure done on time. Please understand that emergencies sometimes delay scheduled procedures.  2. Diet: Do not eat solid foods after midnight.  The patient may have clear liquids until 5am upon the day of the procedure.  3. Labs: You will need to have blood drawn on Today, at [place] do not need to be fasting.  4. Medication instructions in preparation for your procedure:   Contrast Allergy: No  Do not take HCTZ the morning of your procedure.  Do not take Metformin the day before or the day of your procedure.   On the morning of your procedure, take your Aspirin and any morning medicines NOT listed above.   You may use sips of  water.  5. Plan for one night stay--bring personal belongings. 6. Bring a current list of your medications and current insurance cards. 7. You MUST have a responsible person to drive you home. 8. Someone MUST be with you the first 24 hours after you arrive home or your discharge will be delayed. 9. Please wear clothes that are easy to get on and off and wear slip-on shoes.  Thank you for allowing [place] to care for you!   -- [facility]      Signed, Waynetta Pean  07/16/2020 7:51 PM    [place] [address] [place], [place] [address] Phone: [phone] Fax: [phone]

## 2020-07-22 ENCOUNTER — Other Ambulatory Visit: Payer: Self-pay

## 2020-07-22 ENCOUNTER — Ambulatory Visit: Payer: Medicare Other | Admitting: Cardiovascular Disease

## 2020-07-22 ENCOUNTER — Encounter: Payer: Self-pay | Admitting: Cardiovascular Disease

## 2020-07-22 VITALS — BP 150/70 | HR 80 | Ht 70.0 in | Wt 235.4 lb

## 2020-07-22 DIAGNOSIS — I421 Obstructive hypertrophic cardiomyopathy: Secondary | ICD-10-CM | POA: Diagnosis not present

## 2020-07-22 DIAGNOSIS — I350 Nonrheumatic aortic (valve) stenosis: Secondary | ICD-10-CM

## 2020-07-22 NOTE — H&P (View-Only) (Signed)
Cardiology Office Note:    Date:  07/22/2020   ID:  Melvyn Novas Henriquez, DOB [birth year], MRN [id number]  PCP:  [name], [place]  Cardiologist:  Jenkins Rouge, MD  Advanced Practice Provider:  No care team member to display Electrophysiologist:  None       Referring MD: [name], MD   Chief Complaint  Patient presents with  . Aortic Stenosis    History of Present Illness:    [name] is a 70 y.o. female referred by Dr. Johnsie Cancel for evaluation of aortic stenosis.  The patient is here alone today.  She is originally from [place], but has lived in the [place] since 1985.  The patient is married.  She reports having a heart murmur for at least 10 years.  She has been followed regularly in our cardiology practice in [place].  She has known severe asymmetric septal hypertrophy dating back several years now.  She was evaluated in February of this year and an updated echocardiogram was performed.  This demonstrates vigorous LV systolic function with severe basal septal hypertrophy with a basal septal diameter of 1.8 cm and posterior wall thickness of 1.2 cm.  The patient is also noted to have calcification of the posterior mitral annulus with peak and mean transmitral gradients of 15 and 6 mmHg, respectively.  The aortic valve is trileaflet.  The valve appears to be calcified and restricted.  Peak and mean transvalvular gradients are 84 and 40 mmHg, respectively.  With Valsalva, there is a 59 mm subvalvular peak gradient.  The patient complains of exertional dyspnea but she has attributed this to weight gain over the past several years.  States that she is not as active as she used to be.  She denies orthopnea or PND.  She has had no recent problems with leg swelling, lightheadedness, or presyncope.  She does describe some discomfort in her chest that feels like "twinges."  This is not typically related to physical exertion.  She does get fatigued and has tired  legs with physical activity.  Past Medical History:  Diagnosis Date  . Cervical incompetence   . Hay fever   . Heart murmur   . Hemochromatosis   . Hypertension   . Migraines   . Sciatica     Past Surgical History:  Procedure Laterality Date  . CARPAL TUNNEL RELEASE Right   . COLONOSCOPY N/A 08/01/2014   Procedure: COLONOSCOPY;  Surgeon: Rogene Houston, MD;  Location: [facility];  Service: Endoscopy;  Laterality: N/A;  900 -- moved to 10:00 - Ann notified pt  . ELBOW SURGERY    . ROTATOR CUFF REPAIR Right 2012  . TONSILLECTOMY    . TUBAL LIGATION      Current Medications: Current Meds  Medication Sig  . diltiazem (TIAZAC) 180 MG 24 hr capsule Take 180 mg by mouth at bedtime.  . fluticasone (FLONASE) 50 MCG/ACT nasal spray Place 1-2 sprays into both nostrils as needed for allergies or rhinitis.   . metFORMIN (GLUCOPHAGE) 500 MG tablet Take 500 mg by mouth 2 (two) times daily.  . miconazole (MICOTIN) 2 % cream Apply 1 application topically 2 (two) times daily. Under breast  . naproxen sodium (ANAPROX) 220 MG tablet Take 220-440 mg by mouth daily as needed (headaches).  . olmesartan-hydrochlorothiazide (BENICAR HCT) 40-25 MG tablet Take 1 tablet by mouth daily.  Marland Kitchen omeprazole (PRILOSEC) 20 MG capsule Take 20 mg by mouth daily.   Marland Kitchen  propranolol (INDERAL) 10 MG tablet Take 10 mg by mouth as needed (tachycardia).  . rosuvastatin (CRESTOR) 5 MG tablet Take 5 mg by mouth daily.  Marland Kitchen zolpidem (AMBIEN) 10 MG tablet Take 5 mg by mouth at bedtime.  . [DISCONTINUED] propranolol (INDERAL) 10 MG tablet Daily as needed     Allergies:   Patient has no known allergies.   Social History   Socioeconomic History  . Marital status: Married    Spouse name: Not on file  . Number of children: Not on file  . Years of education: Not on file  . Highest education level: Not on file  Occupational History  . Not on file  Tobacco Use  . Smoking status: Former Smoker    Packs/day: 0.10    Types:  Cigarettes    Start date: 09/16/1962    Quit date: 09/15/1969    Years since quitting: 50.8  . Smokeless tobacco: Never Used  . Tobacco comment: as a teenager  Vaping Use  . Vaping Use: Never used  Substance and Sexual Activity  . Alcohol use: Not Currently    Comment:  1-2 month  . Drug use: No  . Sexual activity: Not Currently    Partners: Female    Birth control/protection: Post-menopausal    Comment: BTL  Other Topics Concern  . Not on file  Social History Narrative  . Not on file   Social Determinants of Health   Financial Resource Strain: Not on file  Food Insecurity: Not on file  Transportation Needs: Not on file  Physical Activity: Not on file  Stress: Not on file  Social Connections: Not on file     Family History: The patient's family history includes Breast cancer in her maternal aunt; Cancer in her mother; Heart attack in her father, maternal grandfather, and paternal grandfather; Heart murmur in her brother and father; Migraines in her maternal grandmother.  ROS:   Please see the history of present illness.    All other systems reviewed and are negative.  EKGs/Labs/Other Studies Reviewed:    The following studies were reviewed today: Echo: IMPRESSIONS    1. Left ventricular ejection fraction, by estimation, is 65 to 70%. The  left ventricle has normal function. The left ventricle has no regional  wall motion abnormalities. There is severe asymmetric left ventricular  hypertrophy of the septal segment. Left  ventricular diastolic parameters are consistent with Grade I diastolic  dysfunction (impaired relaxation). LVOT is small with evidence of dynamic  outflow obstruction (mean gradient 59 mmHg with Valsalva).  2. Right ventricular systolic function is normal. The right ventricular  size is normal. There is normal pulmonary artery systolic pressure. The  estimated right ventricular systolic pressure is 02.7 mmHg.  3. Left atrial size was moderately  dilated.  4. The mitral valve is abnormal. Mild mitral valve regurgitation. Mild  mitral stenosis. The mean mitral valve gradient is 6.0 mmHg. Moderate  mitral annular calcification.  5. The aortic valve is tricuspid. There is moderate calcification of the  aortic valve. Aortic valve regurgitation is mild. Severe aortic valve  stenosis based on planimetry and mean gradient, although there is also  dynamic outflow gradient in the LVOT.  Aortic regurgitation PHT measures 509 msec. Aortic valve mean gradient  measures 36.2 mmHg.  6. The inferior vena cava is normal in size with greater than 50%  respiratory variability, suggesting right atrial pressure of 3 mmHg.   FINDINGS  Left Ventricle: Left ventricular ejection fraction, by estimation, is  65  to 70%. The left ventricle has normal function. The left ventricle has no  regional wall motion abnormalities. The left ventricular internal cavity  size was small. There is severe  asymmetric left ventricular hypertrophy of the septal segment. Left  ventricular diastolic parameters are consistent with Grade I diastolic  dysfunction (impaired relaxation).   Right Ventricle: The right ventricular size is normal. No increase in  right ventricular wall thickness. Right ventricular systolic function is  normal. There is normal pulmonary artery systolic pressure. The tricuspid  regurgitant velocity is 2.71 m/s, and  with an assumed right atrial pressure of 3 mmHg, the estimated right  ventricular systolic pressure is 41.9 mmHg.   Left Atrium: Left atrial size was moderately dilated.   Right Atrium: Right atrial size was normal in size.   Pericardium: There is no evidence of pericardial effusion. Presence of  pericardial fat pad.   Mitral Valve: The mitral valve is abnormal. Moderate mitral annular  calcification. Mild mitral valve regurgitation. Mild mitral valve  stenosis. MV peak gradient, 15.1 mmHg. The mean mitral valve gradient is   6.0 mmHg.   Tricuspid Valve: The tricuspid valve is grossly normal. Tricuspid valve  regurgitation is trivial.   Aortic Valve: The aortic valve is tricuspid. There is moderate  calcification of the aortic valve. Aortic valve regurgitation is mild.  Aortic regurgitation PHT measures 509 msec. Severe aortic stenosis is  present. Aortic valve mean gradient measures 36.2  mmHg. Aortic valve peak gradient measures 58.8 mmHg. Aortic valve area, by  VTI measures 4.70 cm.   Pulmonic Valve: The pulmonic valve was grossly normal. Pulmonic valve  regurgitation is trivial.   Aorta: The aortic root is normal in size and structure.   Venous: The inferior vena cava is normal in size with greater than 50%  respiratory variability, suggesting right atrial pressure of 3 mmHg.   IAS/Shunts: No atrial level shunt detected by color flow Doppler.     LEFT VENTRICLE  PLAX 2D  LVIDd:     4.40 cm Diastology  LVIDs:     2.80 cm LV e' medial:  0.05 cm/s  LV PW:     1.20 cm LV E/e' medial: 30.7  LV IVS:    1.80 cm LV e' lateral:  0.04 cm/s  LVOT diam:   2.10 cm LV E/e' lateral: 34.7  LV SV:     334  LV SV Index:  150  LVOT Area:   3.46 cm     RIGHT VENTRICLE  RV S prime:   15.20 cm/s  TAPSE (M-mode): 2.6 cm   LEFT ATRIUM       Index    RIGHT ATRIUM      Index  LA diam:    4.60 cm 2.07 cm/m RA Area:   18.10 cm  LA Vol (A2C):  112.0 ml 50.29 ml/m RA Volume:  48.80 ml 21.91 ml/m  LA Vol (A4C):  82.2 ml 36.91 ml/m  LA Biplane Vol: 104.0 ml 46.70 ml/m  AORTIC VALVE  AV Area (Vmax):  4.35 cm  AV Area (Vmean):  4.18 cm  AV Area (VTI):   4.70 cm  AV Vmax:      383.28 cm/s  AV Vmean:     237.775 cm/s  AV VTI:      0.711 m  AV Peak Grad:   58.8 mmHg  AV Mean Grad:   36.2 mmHg  LVOT Vmax:     481.00 cm/s  LVOT Vmean:    287.000  cm/s  LVOT VTI:     0.964 m  LVOT/AV VTI ratio: 1.36   AI PHT:      509 msec    AORTA  Ao Root diam: 3.60 cm   MITRAL VALVE        TRICUSPID VALVE  MV Area (PHT): 1.50 cm   TR Peak grad:  29.4 mmHg  MV Area VTI:  5.04 cm   TR Vmax:    271.00 cm/s  MV Peak grad: 15.1 mmHg  MV Mean grad: 6.0 mmHg   SHUNTS  MV Vmax:    1.94 m/s   Systemic VTI: 0.96 m  MV Vmean:   111.0 cm/s  Systemic Diam: 2.10 cm  MV Decel Time: 506 msec  MV E velocity: 1.47 cm/s  MV A velocity: 179.00 cm/s  MV E/A ratio: 0.01   EKG:  EKG is not ordered today.    Recent Labs: No results found for requested labs within last 8760 hours.  Recent Lipid Panel No results found for: CHOL, TRIG, HDL, CHOLHDL, VLDL, LDLCALC, LDLDIRECT   Risk Assessment/Calculations:       Physical Exam:    VS:  BP (!) 150/70   Pulse 80   Ht 5\' 10"  (1.778 m)   Wt 235 lb 6.4 oz (106.8 kg)   LMP 04/26/1998 (Within Years)   SpO2 96%   BMI 33.78 kg/m     Wt Readings from Last 3 Encounters:  07/22/20 235 lb 6.4 oz (106.8 kg)  07/16/20 236 lb (107 kg)  06/13/20 238 lb 9.6 oz (108.2 kg)     GEN:  Well nourished, well developed in no acute distress HEENT: Normal NECK: No JVD; No carotid bruits LYMPHATICS: No lymphadenopathy CARDIAC: RRR, 3/6 harsh systolic murmur at the right upper sternal border, A2 is audible RESPIRATORY:  Clear to auscultation without rales, wheezing or rhonchi  ABDOMEN: Soft, non-tender, non-distended MUSCULOSKELETAL:  No edema; No deformity  SKIN: Warm and dry NEUROLOGIC:  Alert and oriented x 3 PSYCHIATRIC:  Normal affect   ASSESSMENT:    1. Nonrheumatic aortic valve stenosis   2. Obstructive hypertrophic cardiomyopathy (HCC)    PLAN:    In order of problems listed above:  70 year old woman with severe, stage D1 aortic stenosis and severe basal septal hypertrophy with evidence of dynamic LV outflow tract obstruction.  She has New York Heart Association functional class II symptoms of chronic diastolic heart  failure.  Echo images are personally reviewed.  The patient has vigorous LV systolic function with LVEF greater than 65%.  There is evidence of subvalvular obstruction with severe basal septal hypertrophy and turbulent flow below the aortic valve in the LV outflow tract.  The subvalvular gradient with Valsalva appears to be about 60 mmHg.  There is also clear evidence of calcific aortic valve stenosis with leaflet restriction and hemodynamic data suggestive of severe aortic stenosis with peak and mean transvalvular gradients of 84 and 40 mmHg, respectively.  The patient understands that her situation is more complicated because of valvular disease but also the likely presence of myocardial disease.  I have reviewed the natural history of aortic stenosis with the patient today. We have discussed the limitations of medical therapy and the poor prognosis associated with symptomatic aortic stenosis. We have reviewed potential treatment options, including palliative medical therapy, conventional surgical aortic valve replacement, and transcatheter aortic valve replacement. We discussed treatment options in the context of the patient's specific comorbid medical conditions.   In order to fully assess this  patient's treatment options, I agree that right and left heart catheterization is indicated.  This will help evaluate for obstructive coronary artery disease and also intracardiac hemodynamics.  I have reviewed the risks, indications, and alternatives to cardiac catheterization, possible angioplasty, and stenting with the patient. Risks include but are not limited to bleeding, infection, vascular injury, stroke, myocardial infection, arrhythmia, kidney injury, radiation-related injury in the case of prolonged fluoroscopy use, emergency cardiac surgery, and death. The patient understands the risks of serious complication is 1-2 in 7494 with diagnostic cardiac cath and 1-2% or less with angioplasty/stenting.  I have  also recommended CT angiography studies to further assess cardiac anatomy, measure aortic valve annulus, and evaluate potential treatment options including TAVR or surgical therapies.  At this time the patient is not interested in considering any type of open heart surgery.  She states that she has a bad premonition about open surgery.  I am not sure that transcatheter aortic valve replacement will be indicated, but this will be better assessed once the above studies are completed.  It also might be reasonable to order a cardiac MRI and/or genetic testing to evaluate for hypertrophic cardiomyopathy.  As part of our discussion today, the patient also indicated that after her testing is completed, she may wish to have a second opinion at a tertiary academic center.   Shared Decision Making/Informed Consent The risks [stroke (1 in 1000), death (1 in 1000), kidney failure [usually temporary] (1 in 500), bleeding (1 in 200), allergic reaction [possibly serious] (1 in 200)], benefits (diagnostic support and management of coronary artery disease) and alternatives of a cardiac catheterization were discussed in detail with [name] and she is willing to proceed.  Medication Adjustments/Labs and Tests Ordered: Current medicines are reviewed at length with the patient today.  Concerns regarding medicines are outlined above.  Orders Placed This Encounter  Procedures  . Basic metabolic panel  . CBC with Differential/Platelet   No orders of the defined types were placed in this encounter.   Patient Instructions  COVID SCREENING INFORMATION (4/1): You are scheduled for your drive-thru COVID screening on 07/25/2020 between 1PM and 2PM. Pre-Procedural COVID-19 Testing Site [address] [place], [place] [address] You will need to go home after your screening and quarantine until your procedure.   CATHETERIZATION INSTRUCTIONS (4/4): You are scheduled for a Cardiac Catheterization on Monday, April 4 with Dr.  Sherren Mocha.  1. Please arrive at the [facility] (Main Entrance A) at [facility]: [address] [place], [place] [address] at 10:00 AM (This time is two hours before your procedure to ensure your preparation). Free valet parking service is available. You are allowed ONE visitor in the waitign room during your procedure. Both you and your guest must wear masks. Special note: Every effort is made to have your procedure done on time. Please understand that emergencies sometimes delay scheduled procedures.  2. Diet: Do not eat solid foods after midnight.  You may have clear liquids until 5am upon the day of the procedure.  3. Labs: TODAY! BMET, CBC  4. Medication instructions in preparation for your procedure:  1) HOLD METFORMIN the morning of and 48 hours after your cath  2) HOLD BENICAR (olmesartan-HCTZ) the day before and day of your cath  3) TAKE ASPIRIN 81 mg the morning of your cath  4) You may take your other medications as directed with sips of water  5. Plan for one night stay--bring personal belongings. 6. Bring a  current list of your medications and current insurance cards. 7. You MUST have a responsible person to drive you home. 8. Someone MUST be with you the first 24 hours after you arrive home or your discharge will be delayed. 9. Please wear clothes that are easy to get on and off and wear slip-on shoes.  Thank you for allowing [place] to care for you!   -- [facility] services     Signed, Sherren Mocha, MD  07/22/2020 3:09 PM    [facility]

## 2020-07-22 NOTE — Patient Instructions (Addendum)
COVID SCREENING INFORMATION (4/1): You are scheduled for your drive-thru COVID screening on 07/25/2020 between 1PM and 2PM. Pre-Procedural COVID-19 Testing Site [address] [place], [place] [address] You will need to go home after your screening and quarantine until your procedure.   CATHETERIZATION INSTRUCTIONS (4/4): You are scheduled for a Cardiac Catheterization on Monday, April 4 with Dr. Sherren Mocha.  1. Please arrive at the [facility] (Main Entrance A) at [facility]: [address] [place], [place] [address] at 10:00 AM (This time is two hours before your procedure to ensure your preparation). Free valet parking service is available. You are allowed ONE visitor in the waitign room during your procedure. Both you and your guest must wear masks. Special note: Every effort is made to have your procedure done on time. Please understand that emergencies sometimes delay scheduled procedures.  2. Diet: Do not eat solid foods after midnight.  You may have clear liquids until 5am upon the day of the procedure.  3. Labs: TODAY! BMET, CBC  4. Medication instructions in preparation for your procedure:  1) HOLD METFORMIN the morning of and 48 hours after your cath  2) HOLD BENICAR (olmesartan-HCTZ) the day before and day of your cath  3) TAKE ASPIRIN 81 mg the morning of your cath  4) You may take your other medications as directed with sips of water  5. Plan for one night stay--bring personal belongings. 6. Bring a current list of your medications and current insurance cards. 7. You MUST have a responsible person to drive you home. 8. Someone MUST be with you the first 24 hours after you arrive home or your discharge will be delayed. 9. Please wear clothes that are easy to get on and off and wear slip-on shoes.  Thank you for allowing [place] to care for you!   -- [facility]

## 2020-07-22 NOTE — Progress Notes (Signed)
Cardiology Office Note:    Date:  07/22/2020   ID:  Melvyn Novas Vale, DOB 22-May-1950, MRN [id number]  PCP:  [name], [place]  Cardiologist:  Jenkins Rouge, MD  Advanced Practice Provider:  No care team member to display Electrophysiologist:  None       Referring MD: [name], MD   Chief Complaint  Patient presents with  . Aortic Stenosis    History of Present Illness:    [name] is a 70 y.o. female referred by Dr. Johnsie Cancel for evaluation of aortic stenosis.  The patient is here alone today.  She is originally from [place], but has lived in the [place] since 1985.  The patient is married.  She reports having a heart murmur for at least 10 years.  She has been followed regularly in our cardiology practice in [place].  She has known severe asymmetric septal hypertrophy dating back several years now.  She was evaluated in February of this year and an updated echocardiogram was performed.  This demonstrates vigorous LV systolic function with severe basal septal hypertrophy with a basal septal diameter of 1.8 cm and posterior wall thickness of 1.2 cm.  The patient is also noted to have calcification of the posterior mitral annulus with peak and mean transmitral gradients of 15 and 6 mmHg, respectively.  The aortic valve is trileaflet.  The valve appears to be calcified and restricted.  Peak and mean transvalvular gradients are 84 and 40 mmHg, respectively.  With Valsalva, there is a 59 mm subvalvular peak gradient.  The patient complains of exertional dyspnea but she has attributed this to weight gain over the past several years.  States that she is not as active as she used to be.  She denies orthopnea or PND.  She has had no recent problems with leg swelling, lightheadedness, or presyncope.  She does describe some discomfort in her chest that feels like "twinges."  This is not typically related to physical exertion.  She does get fatigued and has tired  legs with physical activity.  Past Medical History:  Diagnosis Date  . Cervical incompetence   . Hay fever   . Heart murmur   . Hemochromatosis   . Hypertension   . Migraines   . Sciatica     Past Surgical History:  Procedure Laterality Date  . CARPAL TUNNEL RELEASE Right   . COLONOSCOPY N/A 08/01/2014   Procedure: COLONOSCOPY;  Surgeon: Rogene Houston, MD;  Location: [facility];  Service: Endoscopy;  Laterality: N/A;  900 -- moved to 10:00 - Ann notified pt  . ELBOW SURGERY    . ROTATOR CUFF REPAIR Right 2012  . TONSILLECTOMY    . TUBAL LIGATION      Current Medications: Current Meds  Medication Sig  . diltiazem (TIAZAC) 180 MG 24 hr capsule Take 180 mg by mouth at bedtime.  . fluticasone (FLONASE) 50 MCG/ACT nasal spray Place 1-2 sprays into both nostrils as needed for allergies or rhinitis.   . metFORMIN (GLUCOPHAGE) 500 MG tablet Take 500 mg by mouth 2 (two) times daily.  . miconazole (MICOTIN) 2 % cream Apply 1 application topically 2 (two) times daily. Under breast  . naproxen sodium (ANAPROX) 220 MG tablet Take 220-440 mg by mouth daily as needed (headaches).  . olmesartan-hydrochlorothiazide (BENICAR HCT) 40-25 MG tablet Take 1 tablet by mouth daily.  Marland Kitchen omeprazole (PRILOSEC) 20 MG capsule Take 20 mg by mouth daily.   Marland Kitchen  propranolol (INDERAL) 10 MG tablet Take 10 mg by mouth as needed (tachycardia).  . rosuvastatin (CRESTOR) 5 MG tablet Take 5 mg by mouth daily.  Marland Kitchen zolpidem (AMBIEN) 10 MG tablet Take 5 mg by mouth at bedtime.  . [DISCONTINUED] propranolol (INDERAL) 10 MG tablet Daily as needed     Allergies:   Patient has no known allergies.   Social History   Socioeconomic History  . Marital status: Married    Spouse name: Not on file  . Number of children: Not on file  . Years of education: Not on file  . Highest education level: Not on file  Occupational History  . Not on file  Tobacco Use  . Smoking status: Former Smoker    Packs/day: 0.10    Types:  Cigarettes    Start date: 09/16/1962    Quit date: 09/15/1969    Years since quitting: 50.8  . Smokeless tobacco: Never Used  . Tobacco comment: as a teenager  Vaping Use  . Vaping Use: Never used  Substance and Sexual Activity  . Alcohol use: Not Currently    Comment:  1-2 month  . Drug use: No  . Sexual activity: Not Currently    Partners: Female    Birth control/protection: Post-menopausal    Comment: BTL  Other Topics Concern  . Not on file  Social History Narrative  . Not on file   Social Determinants of Health   Financial Resource Strain: Not on file  Food Insecurity: Not on file  Transportation Needs: Not on file  Physical Activity: Not on file  Stress: Not on file  Social Connections: Not on file     Family History: The patient's family history includes Breast cancer in her maternal aunt; Cancer in her mother; Heart attack in her father, maternal grandfather, and paternal grandfather; Heart murmur in her brother and father; Migraines in her maternal grandmother.  ROS:   Please see the history of present illness.    All other systems reviewed and are negative.  EKGs/Labs/Other Studies Reviewed:    The following studies were reviewed today: Echo: IMPRESSIONS    1. Left ventricular ejection fraction, by estimation, is 65 to 70%. The  left ventricle has normal function. The left ventricle has no regional  wall motion abnormalities. There is severe asymmetric left ventricular  hypertrophy of the septal segment. Left  ventricular diastolic parameters are consistent with Grade I diastolic  dysfunction (impaired relaxation). LVOT is small with evidence of dynamic  outflow obstruction (mean gradient 59 mmHg with Valsalva).  2. Right ventricular systolic function is normal. The right ventricular  size is normal. There is normal pulmonary artery systolic pressure. The  estimated right ventricular systolic pressure is 52.7 mmHg.  3. Left atrial size was moderately  dilated.  4. The mitral valve is abnormal. Mild mitral valve regurgitation. Mild  mitral stenosis. The mean mitral valve gradient is 6.0 mmHg. Moderate  mitral annular calcification.  5. The aortic valve is tricuspid. There is moderate calcification of the  aortic valve. Aortic valve regurgitation is mild. Severe aortic valve  stenosis based on planimetry and mean gradient, although there is also  dynamic outflow gradient in the LVOT.  Aortic regurgitation PHT measures 509 msec. Aortic valve mean gradient  measures 36.2 mmHg.  6. The inferior vena cava is normal in size with greater than 50%  respiratory variability, suggesting right atrial pressure of 3 mmHg.   FINDINGS  Left Ventricle: Left ventricular ejection fraction, by estimation, is  65  to 70%. The left ventricle has normal function. The left ventricle has no  regional wall motion abnormalities. The left ventricular internal cavity  size was small. There is severe  asymmetric left ventricular hypertrophy of the septal segment. Left  ventricular diastolic parameters are consistent with Grade I diastolic  dysfunction (impaired relaxation).   Right Ventricle: The right ventricular size is normal. No increase in  right ventricular wall thickness. Right ventricular systolic function is  normal. There is normal pulmonary artery systolic pressure. The tricuspid  regurgitant velocity is 2.71 m/s, and  with an assumed right atrial pressure of 3 mmHg, the estimated right  ventricular systolic pressure is 26.3 mmHg.   Left Atrium: Left atrial size was moderately dilated.   Right Atrium: Right atrial size was normal in size.   Pericardium: There is no evidence of pericardial effusion. Presence of  pericardial fat pad.   Mitral Valve: The mitral valve is abnormal. Moderate mitral annular  calcification. Mild mitral valve regurgitation. Mild mitral valve  stenosis. MV peak gradient, 15.1 mmHg. The mean mitral valve gradient is   6.0 mmHg.   Tricuspid Valve: The tricuspid valve is grossly normal. Tricuspid valve  regurgitation is trivial.   Aortic Valve: The aortic valve is tricuspid. There is moderate  calcification of the aortic valve. Aortic valve regurgitation is mild.  Aortic regurgitation PHT measures 509 msec. Severe aortic stenosis is  present. Aortic valve mean gradient measures 36.2  mmHg. Aortic valve peak gradient measures 58.8 mmHg. Aortic valve area, by  VTI measures 4.70 cm.   Pulmonic Valve: The pulmonic valve was grossly normal. Pulmonic valve  regurgitation is trivial.   Aorta: The aortic root is normal in size and structure.   Venous: The inferior vena cava is normal in size with greater than 50%  respiratory variability, suggesting right atrial pressure of 3 mmHg.   IAS/Shunts: No atrial level shunt detected by color flow Doppler.     LEFT VENTRICLE  PLAX 2D  LVIDd:     4.40 cm Diastology  LVIDs:     2.80 cm LV e' medial:  0.05 cm/s  LV PW:     1.20 cm LV E/e' medial: 30.7  LV IVS:    1.80 cm LV e' lateral:  0.04 cm/s  LVOT diam:   2.10 cm LV E/e' lateral: 34.7  LV SV:     334  LV SV Index:  150  LVOT Area:   3.46 cm     RIGHT VENTRICLE  RV S prime:   15.20 cm/s  TAPSE (M-mode): 2.6 cm   LEFT ATRIUM       Index    RIGHT ATRIUM      Index  LA diam:    4.60 cm 2.07 cm/m RA Area:   18.10 cm  LA Vol (A2C):  112.0 ml 50.29 ml/m RA Volume:  48.80 ml 21.91 ml/m  LA Vol (A4C):  82.2 ml 36.91 ml/m  LA Biplane Vol: 104.0 ml 46.70 ml/m  AORTIC VALVE  AV Area (Vmax):  4.35 cm  AV Area (Vmean):  4.18 cm  AV Area (VTI):   4.70 cm  AV Vmax:      383.28 cm/s  AV Vmean:     237.775 cm/s  AV VTI:      0.711 m  AV Peak Grad:   58.8 mmHg  AV Mean Grad:   36.2 mmHg  LVOT Vmax:     481.00 cm/s  LVOT Vmean:    287.000  cm/s  LVOT VTI:     0.964 m  LVOT/AV VTI ratio: 1.36   AI PHT:      509 msec    AORTA  Ao Root diam: 3.60 cm   MITRAL VALVE        TRICUSPID VALVE  MV Area (PHT): 1.50 cm   TR Peak grad:  29.4 mmHg  MV Area VTI:  5.04 cm   TR Vmax:    271.00 cm/s  MV Peak grad: 15.1 mmHg  MV Mean grad: 6.0 mmHg   SHUNTS  MV Vmax:    1.94 m/s   Systemic VTI: 0.96 m  MV Vmean:   111.0 cm/s  Systemic Diam: 2.10 cm  MV Decel Time: 506 msec  MV E velocity: 1.47 cm/s  MV A velocity: 179.00 cm/s  MV E/A ratio: 0.01   EKG:  EKG is not ordered today.    Recent Labs: No results found for requested labs within last 8760 hours.  Recent Lipid Panel No results found for: CHOL, TRIG, HDL, CHOLHDL, VLDL, LDLCALC, LDLDIRECT   Risk Assessment/Calculations:       Physical Exam:    VS:  BP (!) 150/70   Pulse 80   Ht 5\' 10"  (1.778 m)   Wt 235 lb 6.4 oz (106.8 kg)   LMP 04/26/1998 (Within Years)   SpO2 96%   BMI 33.78 kg/m     Wt Readings from Last 3 Encounters:  07/22/20 235 lb 6.4 oz (106.8 kg)  07/16/20 236 lb (107 kg)  06/13/20 238 lb 9.6 oz (108.2 kg)     GEN:  Well nourished, well developed in no acute distress HEENT: Normal NECK: No JVD; No carotid bruits LYMPHATICS: No lymphadenopathy CARDIAC: RRR, 3/6 harsh systolic murmur at the right upper sternal border, A2 is audible RESPIRATORY:  Clear to auscultation without rales, wheezing or rhonchi  ABDOMEN: Soft, non-tender, non-distended MUSCULOSKELETAL:  No edema; No deformity  SKIN: Warm and dry NEUROLOGIC:  Alert and oriented x 3 PSYCHIATRIC:  Normal affect   ASSESSMENT:    1. Nonrheumatic aortic valve stenosis   2. Obstructive hypertrophic cardiomyopathy (HCC)    PLAN:    In order of problems listed above:  70 year old woman with severe, stage D1 aortic stenosis and severe basal septal hypertrophy with evidence of dynamic LV outflow tract obstruction.  She has New York Heart Association functional class II symptoms of chronic diastolic heart  failure.  Echo images are personally reviewed.  The patient has vigorous LV systolic function with LVEF greater than 65%.  There is evidence of subvalvular obstruction with severe basal septal hypertrophy and turbulent flow below the aortic valve in the LV outflow tract.  The subvalvular gradient with Valsalva appears to be about 60 mmHg.  There is also clear evidence of calcific aortic valve stenosis with leaflet restriction and hemodynamic data suggestive of severe aortic stenosis with peak and mean transvalvular gradients of 84 and 40 mmHg, respectively.  The patient understands that her situation is more complicated because of valvular disease but also the likely presence of myocardial disease.  I have reviewed the natural history of aortic stenosis with the patient today. We have discussed the limitations of medical therapy and the poor prognosis associated with symptomatic aortic stenosis. We have reviewed potential treatment options, including palliative medical therapy, conventional surgical aortic valve replacement, and transcatheter aortic valve replacement. We discussed treatment options in the context of the patient's specific comorbid medical conditions.   In order to fully assess this  patient's treatment options, I agree that right and left heart catheterization is indicated.  This will help evaluate for obstructive coronary artery disease and also intracardiac hemodynamics.  I have reviewed the risks, indications, and alternatives to cardiac catheterization, possible angioplasty, and stenting with the patient. Risks include but are not limited to bleeding, infection, vascular injury, stroke, myocardial infection, arrhythmia, kidney injury, radiation-related injury in the case of prolonged fluoroscopy use, emergency cardiac surgery, and death. The patient understands the risks of serious complication is 1-2 in 8242 with diagnostic cardiac cath and 1-2% or less with angioplasty/stenting.  I have  also recommended CT angiography studies to further assess cardiac anatomy, measure aortic valve annulus, and evaluate potential treatment options including TAVR or surgical therapies.  At this time the patient is not interested in considering any type of open heart surgery.  She states that she has a bad premonition about open surgery.  I am not sure that transcatheter aortic valve replacement will be indicated, but this will be better assessed once the above studies are completed.  It also might be reasonable to order a cardiac MRI and/or genetic testing to evaluate for hypertrophic cardiomyopathy.  As part of our discussion today, the patient also indicated that after her testing is completed, she may wish to have a second opinion at a tertiary academic center.   Shared Decision Making/Informed Consent The risks [stroke (1 in 1000), death (1 in 1000), kidney failure [usually temporary] (1 in 500), bleeding (1 in 200), allergic reaction [possibly serious] (1 in 200)], benefits (diagnostic support and management of coronary artery disease) and alternatives of a cardiac catheterization were discussed in detail with Ms. Dewoody and she is willing to proceed.  Medication Adjustments/Labs and Tests Ordered: Current medicines are reviewed at length with the patient today.  Concerns regarding medicines are outlined above.  Orders Placed This Encounter  Procedures  . Basic metabolic panel  . CBC with Differential/Platelet   No orders of the defined types were placed in this encounter.   Patient Instructions  COVID SCREENING INFORMATION (4/1): You are scheduled for your drive-thru COVID screening on 07/25/2020 between 1PM and 2PM. Pre-Procedural COVID-19 Testing Site [address] [place], [place] [address] You will need to go home after your screening and quarantine until your procedure.   CATHETERIZATION INSTRUCTIONS (4/4): You are scheduled for a Cardiac Catheterization on Monday, April 4 with Dr.  Sherren Mocha.  1. Please arrive at the [facility] (Main Entrance A) at [facility]: [address] [place], [place] [address] at 10:00 AM (This time is two hours before your procedure to ensure your preparation). Free valet parking service is available. You are allowed ONE visitor in the waitign room during your procedure. Both you and your guest must wear masks. Special note: Every effort is made to have your procedure done on time. Please understand that emergencies sometimes delay scheduled procedures.  2. Diet: Do not eat solid foods after midnight.  You may have clear liquids until 5am upon the day of the procedure.  3. Labs: TODAY! BMET, CBC  4. Medication instructions in preparation for your procedure:  1) HOLD METFORMIN the morning of and 48 hours after your cath  2) HOLD BENICAR (olmesartan-HCTZ) the day before and day of your cath  3) TAKE ASPIRIN 81 mg the morning of your cath  4) You may take your other medications as directed with sips of water  5. Plan for one night stay--bring personal belongings. 6. Bring a  current list of your medications and current insurance cards. 7. You MUST have a responsible person to drive you home. 8. Someone MUST be with you the first 24 hours after you arrive home or your discharge will be delayed. 9. Please wear clothes that are easy to get on and off and wear slip-on shoes.  Thank you for allowing [place] to care for you!   -- [facility] services     Signed, Sherren Mocha, MD  07/22/2020 3:09 PM    [facility]

## 2020-07-23 LAB — CBC WITH DIFFERENTIAL/PLATELET
Basophils Absolute: 0.1 10*3/uL (ref 0.0–0.2)
Basos: 1 %
EOS (ABSOLUTE): 0.2 10*3/uL (ref 0.0–0.4)
Eos: 2 %
Hematocrit: 37.8 % (ref 34.0–46.6)
Hemoglobin: 12.9 g/dL (ref 11.1–15.9)
Immature Grans (Abs): 0 10*3/uL (ref 0.0–0.1)
Immature Granulocytes: 0 %
Lymphocytes Absolute: 2.3 10*3/uL (ref 0.7–3.1)
Lymphs: 21 %
MCH: 30 pg (ref 26.6–33.0)
MCHC: 34.1 g/dL (ref 31.5–35.7)
MCV: 88 fL (ref 79–97)
Monocytes Absolute: 0.8 10*3/uL (ref 0.1–0.9)
Monocytes: 7 %
Neutrophils Absolute: 7.6 10*3/uL — ABNORMAL HIGH (ref 1.4–7.0)
Neutrophils: 69 %
Platelets: 290 10*3/uL (ref 150–450)
RBC: 4.3 x10E6/uL (ref 3.77–5.28)
RDW: 13 % (ref 11.7–15.4)
WBC: 11 10*3/uL — ABNORMAL HIGH (ref 3.4–10.8)

## 2020-07-23 LAB — BASIC METABOLIC PANEL
BUN/Creatinine Ratio: 20 (ref 12–28)
BUN: 22 mg/dL (ref 8–27)
CO2: 21 mmol/L (ref 20–29)
Calcium: 9.9 mg/dL (ref 8.7–10.3)
Chloride: 99 mmol/L (ref 96–106)
Creatinine, Ser: 1.12 mg/dL — ABNORMAL HIGH (ref 0.57–1.00)
Glucose: 97 mg/dL (ref 65–99)
Potassium: 3.8 mmol/L (ref 3.5–5.2)
Sodium: 139 mmol/L (ref 134–144)
eGFR: 52 mL/min/{1.73_m2} — ABNORMAL LOW (ref >59)

## 2020-07-24 ENCOUNTER — Telehealth: Payer: Self-pay | Admitting: *Deleted

## 2020-07-24 NOTE — Telephone Encounter (Signed)
Pt is returning call.  

## 2020-07-24 NOTE — Telephone Encounter (Addendum)
Pt contacted pre-catheterization scheduled at [facility] for: Monday July 28, 2020 12 Noon Verified arrival time and place: [facility]) at: 10 AM   No solid food after midnight prior to cath, clear liquids until 5 AM day of procedure.  Hold: Olmesartan-HCT-day before and day of procedure-GFR 52 Pt will take day before-concerned about BP if she does not take medication day before and day of procedure. Metformin-day of procedure and 48 hours post procedure  Except hold medications AM meds can be  taken pre-cath with sips of water including: ASA 81 mg   Confirmed patient has responsible adult to drive home post procedure and be with patient first 24 hours after arriving home: yes  You are allowed ONE visitor in the waiting room during the time you are at the hospital for your procedure. Both you and your visitor must wear a mask once you enter the hospital.  Reviewed procedure/mask/visitor instructions with patient.

## 2020-07-25 ENCOUNTER — Other Ambulatory Visit (HOSPITAL_COMMUNITY)
Admission: RE | Admit: 2020-07-25 | Discharge: 2020-07-25 | Disposition: A | Discharge status: Home / Self Care | Payer: Medicare Other | Source: Ambulatory Visit | Attending: Cardiovascular Disease | Admitting: Cardiovascular Disease

## 2020-07-25 DIAGNOSIS — Z20822 Contact with and (suspected) exposure to covid-19: Secondary | ICD-10-CM | POA: Diagnosis not present

## 2020-07-25 DIAGNOSIS — Z01812 Encounter for preprocedural laboratory examination: Secondary | ICD-10-CM | POA: Diagnosis not present

## 2020-07-26 LAB — SARS CORONAVIRUS 2 (TAT 6-24 HRS): SARS Coronavirus 2: NEGATIVE

## 2020-07-28 ENCOUNTER — Other Ambulatory Visit: Payer: Self-pay

## 2020-07-28 ENCOUNTER — Ambulatory Visit (HOSPITAL_COMMUNITY)
Admission: RE | Admit: 2020-07-28 | Discharge: 2020-07-28 | Disposition: A | Discharge status: Home / Self Care | Payer: Medicare Other | Attending: Cardiovascular Disease | Admitting: Cardiovascular Disease

## 2020-07-28 ENCOUNTER — Encounter (HOSPITAL_COMMUNITY)
Admission: RE | Disposition: A | Discharge status: Home / Self Care | Payer: Self-pay | Source: Home / Self Care | Attending: Cardiovascular Disease

## 2020-07-28 DIAGNOSIS — Z87891 Personal history of nicotine dependence: Secondary | ICD-10-CM | POA: Insufficient documentation

## 2020-07-28 DIAGNOSIS — I2510 Atherosclerotic heart disease of native coronary artery without angina pectoris: Secondary | ICD-10-CM | POA: Diagnosis not present

## 2020-07-28 DIAGNOSIS — Z79899 Other long term (current) drug therapy: Secondary | ICD-10-CM | POA: Diagnosis not present

## 2020-07-28 DIAGNOSIS — Z7984 Long term (current) use of oral hypoglycemic drugs: Secondary | ICD-10-CM | POA: Diagnosis not present

## 2020-07-28 DIAGNOSIS — I10 Essential (primary) hypertension: Secondary | ICD-10-CM | POA: Diagnosis not present

## 2020-07-28 DIAGNOSIS — I350 Nonrheumatic aortic (valve) stenosis: Secondary | ICD-10-CM | POA: Insufficient documentation

## 2020-07-28 DIAGNOSIS — Z8249 Family history of ischemic heart disease and other diseases of the circulatory system: Secondary | ICD-10-CM | POA: Diagnosis not present

## 2020-07-28 HISTORY — PX: RIGHT/LEFT HEART CATH AND CORONARY ANGIOGRAPHY: CATH118266

## 2020-07-28 LAB — POCT I-STAT EG7
Acid-Base Excess: 2 mmol/L (ref 0.0–2.0)
Bicarbonate: 27.7 mmol/L (ref 20.0–28.0)
Calcium, Ion: 1.21 mmol/L (ref 1.15–1.40)
HCT: 33 % — ABNORMAL LOW (ref 36.0–46.0)
Hemoglobin: 11.2 g/dL — ABNORMAL LOW (ref 12.0–15.0)
O2 Saturation: 75 %
Potassium: 3.3 mmol/L — ABNORMAL LOW (ref 3.5–5.1)
Sodium: 140 mmol/L (ref 135–145)
TCO2: 29 mmol/L (ref 22–32)
pCO2, Ven: 47 mmHg (ref 44.0–60.0)
pH, Ven: 7.379 (ref 7.250–7.430)
pO2, Ven: 42 mmHg (ref 32.0–45.0)

## 2020-07-28 SURGERY — RIGHT/LEFT HEART CATH AND CORONARY ANGIOGRAPHY
Anesthesia: LOCAL

## 2020-07-28 MED ORDER — HEPARIN (PORCINE) IN NACL 1000-0.9 UT/500ML-% IV SOLN
INTRAVENOUS | Status: AC
  Filled 2020-07-28: qty 1000

## 2020-07-28 MED ORDER — VERAPAMIL HCL 2.5 MG/ML IV SOLN
INTRAVENOUS | Status: DC | PRN
  Administered 2020-07-28: 10 mL via INTRA_ARTERIAL

## 2020-07-28 MED ORDER — IOHEXOL 350 MG/ML SOLN
INTRAVENOUS | Status: DC | PRN
  Administered 2020-07-28: 60 mL

## 2020-07-28 MED ORDER — FENTANYL CITRATE (PF) 100 MCG/2ML IJ SOLN
INTRAMUSCULAR | Status: AC
  Filled 2020-07-28: qty 2

## 2020-07-28 MED ORDER — SODIUM CHLORIDE 0.9 % WEIGHT BASED INFUSION
3.0000 mL/kg/h | INTRAVENOUS | Status: AC
  Administered 2020-07-28: 3 mL/kg/h via INTRAVENOUS

## 2020-07-28 MED ORDER — HEPARIN SODIUM (PORCINE) 1000 UNIT/ML IJ SOLN
INTRAMUSCULAR | Status: DC | PRN
Start: 2020-07-28 — End: 2020-07-28
  Administered 2020-07-28: 5000 [IU] via INTRAVENOUS

## 2020-07-28 MED ORDER — HEPARIN (PORCINE) IN NACL 1000-0.9 UT/500ML-% IV SOLN
INTRAVENOUS | Status: DC | PRN
  Administered 2020-07-28 (×2): 500 mL

## 2020-07-28 MED ORDER — SODIUM CHLORIDE 0.9 % IV SOLN: 250.0000 mL | INTRAVENOUS | Status: DC | PRN

## 2020-07-28 MED ORDER — SODIUM CHLORIDE 0.9 % WEIGHT BASED INFUSION: 1.0000 mL/kg/h | INTRAVENOUS | Status: DC

## 2020-07-28 MED ORDER — HEPARIN SODIUM (PORCINE) 1000 UNIT/ML IJ SOLN
INTRAMUSCULAR | Status: AC
  Filled 2020-07-28: qty 1

## 2020-07-28 MED ORDER — VERAPAMIL HCL 2.5 MG/ML IV SOLN
INTRAVENOUS | Status: AC
  Filled 2020-07-28: qty 2

## 2020-07-28 MED ORDER — SODIUM CHLORIDE 0.9% FLUSH: 3.0000 mL | Freq: Two times a day (BID) | INTRAVENOUS | Status: DC

## 2020-07-28 MED ORDER — MIDAZOLAM HCL 2 MG/2ML IJ SOLN
INTRAMUSCULAR | Status: AC
  Filled 2020-07-28: qty 2

## 2020-07-28 MED ORDER — ONDANSETRON HCL 4 MG/2ML IJ SOLN: 4.0000 mg | Freq: Four times a day (QID) | INTRAMUSCULAR | Status: DC | PRN

## 2020-07-28 MED ORDER — HYDRALAZINE HCL 20 MG/ML IJ SOLN: 10.0000 mg | INTRAMUSCULAR | Status: DC | PRN

## 2020-07-28 MED ORDER — ACETAMINOPHEN 325 MG PO TABS: 650.0000 mg | ORAL_TABLET | ORAL | Status: DC | PRN

## 2020-07-28 MED ORDER — ASPIRIN 81 MG PO CHEW: 81.0000 mg | CHEWABLE_TABLET | ORAL | Status: DC

## 2020-07-28 MED ORDER — FENTANYL CITRATE (PF) 100 MCG/2ML IJ SOLN
INTRAMUSCULAR | Status: DC | PRN
  Administered 2020-07-28: 50 ug via INTRAVENOUS
  Administered 2020-07-28: 25 ug via INTRAVENOUS

## 2020-07-28 MED ORDER — SODIUM CHLORIDE 0.9% FLUSH: 3.0000 mL | INTRAVENOUS | Status: DC | PRN

## 2020-07-28 MED ORDER — LIDOCAINE HCL (PF) 1 % IJ SOLN
INTRAMUSCULAR | Status: DC | PRN
  Administered 2020-07-28 (×3): 2 mL

## 2020-07-28 MED ORDER — LABETALOL HCL 5 MG/ML IV SOLN: 10.0000 mg | INTRAVENOUS | Status: DC | PRN

## 2020-07-28 MED ORDER — MIDAZOLAM HCL 2 MG/2ML IJ SOLN
INTRAMUSCULAR | Status: DC | PRN
  Administered 2020-07-28 (×2): 2 mg via INTRAVENOUS

## 2020-07-28 MED ORDER — LIDOCAINE HCL (PF) 1 % IJ SOLN
INTRAMUSCULAR | Status: AC
  Filled 2020-07-28: qty 30

## 2020-07-28 SURGICAL SUPPLY — 12 items
CATH 5FR JL3.5 JR4 ANG PIG MP (CATHETERS) ×2 IMPLANT
CATH SWAN GANZ 7F STRAIGHT (CATHETERS) ×2 IMPLANT
DEVICE RAD COMP TR BAND LRG (VASCULAR PRODUCTS) ×2 IMPLANT
GLIDESHEATH SLEND SS 6F .021 (SHEATH) ×2 IMPLANT
GLIDESHEATH SLENDER 7FR .021G (SHEATH) ×4 IMPLANT
GUIDEWIRE INQWIRE 1.5J.035X260 (WIRE) ×1 IMPLANT
INQWIRE 1.5J .035X260CM (WIRE) ×2
KIT HEART LEFT (KITS) ×2 IMPLANT
PACK CARDIAC CATHETERIZATION (CUSTOM PROCEDURE TRAY) ×2 IMPLANT
SHEATH PROBE COVER 6X72 (BAG) ×2 IMPLANT
TRANSDUCER W/STOPCOCK (MISCELLANEOUS) ×2 IMPLANT
TUBING CIL FLEX 10 FLL-RA (TUBING) ×2 IMPLANT

## 2020-07-28 NOTE — Discharge Instructions (Signed)
Drink plenty of fluids for 48 hours and keep wrist elevated at heart level for 24 hours Hold Metformin for 48 hours  Radial Site Care   This sheet gives you information about how to care for yourself after your procedure. Your health care provider may also give you more specific instructions. If you have problems or questions, contact your health care provider. What can I expect after the procedure? After the procedure, it is common to have:  Bruising and tenderness at the catheter insertion area. Follow these instructions at home: Medicines  Take over-the-counter and prescription medicines only as told by your health care provider. Insertion site care 1. Follow instructions from your health care provider about how to take care of your insertion site. Make sure you: ? Wash your hands with soap and water before you change your bandage (dressing). If soap and water are not available, use hand sanitizer. ? Remove your dressing as told by your health care provider. In 24 hours 2. Check your insertion site every day for signs of infection. Check for: ? Redness, swelling, or pain. ? Fluid or blood. ? Pus or a bad smell. ? Warmth. 3. Do not take baths, swim, or use a hot tub until your health care provider approves. 4. You may shower 24-48 hours after the procedure, or as directed by your health care provider. ? Remove the dressing and gently wash the site with plain soap and water. ? Pat the area dry with a clean towel. ? Do not rub the site. That could cause bleeding. 5. Do not apply powder or lotion to the site. Activity   1. For 24 hours after the procedure, or as directed by your health care provider: ? Do not flex or bend the affected arm. ? Do not push or pull heavy objects with the affected arm. ? Do not drive yourself home from the hospital or clinic. You may drive 24 hours after the procedure unless your health care provider tells you not to. ? Do not operate machinery or  power tools. 2. Do not lift anything that is heavier than 10 lb (4.5 kg), or the limit that you are told, until your health care provider says that it is safe.  For 4 days 3. Ask your health care provider when it is okay to: ? Return to work or school. ? Resume usual physical activities or sports. ? Resume sexual activity. General instructions  If the catheter site starts to bleed, raise your arm and put firm pressure on the site. If the bleeding does not stop, get help right away. This is a medical emergency.  If you went home on the same day as your procedure, a responsible adult should be with you for the first 24 hours after you arrive home.  Keep all follow-up visits as told by your health care provider. This is important. Contact a health care provider if:  You have a fever.  You have redness, swelling, or yellow drainage around your insertion site. Get help right away if:  You have unusual pain at the radial site.  The catheter insertion area swells very fast.  The insertion area is bleeding, and the bleeding does not stop when you hold steady pressure on the area.  Your arm or hand becomes pale, cool, tingly, or numb. These symptoms may represent a serious problem that is an emergency. Do not wait to see if the symptoms will go away. Get medical help right away. Call your local emergency services (  911 in the [place] Do not drive yourself to the hospital. Summary  After the procedure, it is common to have bruising and tenderness at the site.  Follow instructions from your health care provider about how to take care of your radial site wound. Check the wound every day for signs of infection.  Do not lift anything that is heavier than 10 lb (4.5 kg), or the limit that you are told, until your health care provider says that it is safe. This information is not intended to replace advice given to you by your health care provider. Make sure you discuss any questions you have with  your health care provider. Document Revised: 05/18/2017 Document Reviewed: 05/18/2017 Elsevier Patient Education  2020 [employer].

## 2020-07-28 NOTE — Interval H&P Note (Signed)
History and Physical Interval Note:  07/28/2020 12:42 PM  [name]  has presented today for surgery, with the diagnosis of aortic stenosis.  The various methods of treatment have been discussed with the patient and family. After consideration of risks, benefits and other options for treatment, the patient has consented to  Procedure(s): RIGHT/LEFT HEART CATH AND CORONARY ANGIOGRAPHY (N/A) as a surgical intervention.  The patient's history has been reviewed, patient examined, no change in status, stable for surgery.  I have reviewed the patient's chart and labs.  Questions were answered to the patient's satisfaction.     Sherren Mocha

## 2020-07-28 NOTE — Progress Notes (Signed)
TR BAND REMOVAL  LOCATION:    right radial  DEFLATED PER PROTOCOL:    Yes.    TIME BAND OFF / DRESSING APPLIED:    1600    SITE UPON ARRIVAL:    Level 0  SITE AFTER BAND REMOVAL:    Level 0  CIRCULATION SENSATION AND MOVEMENT:    Within Normal Limits   Yes.    COMMENTS:

## 2020-07-29 ENCOUNTER — Encounter (HOSPITAL_COMMUNITY): Payer: Self-pay | Admitting: Cardiovascular Disease

## 2020-07-30 ENCOUNTER — Institutional Professional Consult (permissible substitution): Payer: Medicare Other | Admitting: Cardiovascular Disease

## 2020-08-06 ENCOUNTER — Encounter: Payer: Self-pay | Admitting: Cardiovascular Disease

## 2020-08-06 NOTE — Progress Notes (Signed)
Patient's case was reviewed in our multidisciplinary team meeting.  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  We do not feel that TAVR would be appropriate without treatment of her subaortic stenosis.  She has not undergone much evaluation for hypertrophic cardiomyopathy and specifically has not undergone genetic testing or cardiac MRI studies.  I recommended referral to see Dr. [name] at [facility] for consideration of further diagnostic evaluation as well as consideration of alcohol septal ablation if indicated.  Sherren Mocha 08/06/2020 2:38 PM

## 2020-08-07 ENCOUNTER — Telehealth: Payer: Self-pay

## 2020-08-07 DIAGNOSIS — I421 Obstructive hypertrophic cardiomyopathy: Secondary | ICD-10-CM

## 2020-08-07 DIAGNOSIS — I350 Nonrheumatic aortic (valve) stenosis: Secondary | ICD-10-CM

## 2020-08-07 NOTE — Telephone Encounter (Addendum)
Per Dr. Burt Knack,  Patient's case was reviewed in our multidisciplinary team meeting.  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  We do not feel that TAVR would be appropriate without treatment of her subaortic stenosis.  She has not undergone much evaluation for hypertrophic cardiomyopathy and specifically has not undergone genetic testing or cardiac MRI studies.  I recommended referral to see Dr. [name] at [facility] for consideration of further diagnostic evaluation as well as consideration of alcohol septal ablation if indicated.  Sherren Mocha 08/06/2020 2:38 PM      Referral to Dr. [name] placed and faxed to the [facility] referral center at fax [phone].

## 2020-09-10 DIAGNOSIS — H43393 Other vitreous opacities, bilateral: Secondary | ICD-10-CM | POA: Diagnosis not present

## 2020-09-24 NOTE — Telephone Encounter (Signed)
Spoke with the patient and confirmed [facility] has not called to schedule an appointment.  Spoke with RN at [employer]. She apologized for delay and will call the patient now to schedule appointment.

## 2020-10-06 DIAGNOSIS — M1711 Unilateral primary osteoarthritis, right knee: Secondary | ICD-10-CM | POA: Diagnosis not present

## 2020-10-28 DIAGNOSIS — B079 Viral wart, unspecified: Secondary | ICD-10-CM | POA: Diagnosis not present

## 2020-10-28 DIAGNOSIS — L570 Actinic keratosis: Secondary | ICD-10-CM | POA: Diagnosis not present

## 2020-10-28 DIAGNOSIS — D485 Neoplasm of uncertain behavior of skin: Secondary | ICD-10-CM | POA: Diagnosis not present

## 2020-10-28 DIAGNOSIS — L821 Other seborrheic keratosis: Secondary | ICD-10-CM | POA: Diagnosis not present

## 2020-10-28 DIAGNOSIS — L304 Erythema intertrigo: Secondary | ICD-10-CM | POA: Diagnosis not present

## 2020-11-06 DIAGNOSIS — Q248 Other specified congenital malformations of heart: Secondary | ICD-10-CM | POA: Diagnosis not present

## 2020-11-06 DIAGNOSIS — Q249 Congenital malformation of heart, unspecified: Secondary | ICD-10-CM | POA: Diagnosis not present

## 2020-11-06 DIAGNOSIS — I350 Nonrheumatic aortic (valve) stenosis: Secondary | ICD-10-CM | POA: Diagnosis not present

## 2020-11-06 DIAGNOSIS — R002 Palpitations: Secondary | ICD-10-CM | POA: Diagnosis not present

## 2020-12-01 DIAGNOSIS — R002 Palpitations: Secondary | ICD-10-CM | POA: Diagnosis not present

## 2020-12-01 DIAGNOSIS — D239 Other benign neoplasm of skin, unspecified: Secondary | ICD-10-CM | POA: Diagnosis not present

## 2020-12-01 DIAGNOSIS — E119 Type 2 diabetes mellitus without complications: Secondary | ICD-10-CM | POA: Diagnosis not present

## 2020-12-01 DIAGNOSIS — R011 Cardiac murmur, unspecified: Secondary | ICD-10-CM | POA: Diagnosis not present

## 2020-12-01 DIAGNOSIS — E039 Hypothyroidism, unspecified: Secondary | ICD-10-CM | POA: Diagnosis not present

## 2020-12-01 DIAGNOSIS — E782 Mixed hyperlipidemia: Secondary | ICD-10-CM | POA: Diagnosis not present

## 2020-12-01 DIAGNOSIS — N182 Chronic kidney disease, stage 2 (mild): Secondary | ICD-10-CM | POA: Diagnosis not present

## 2020-12-03 DIAGNOSIS — M170 Bilateral primary osteoarthritis of knee: Secondary | ICD-10-CM | POA: Diagnosis not present

## 2020-12-04 ENCOUNTER — Telehealth: Payer: Self-pay

## 2020-12-04 NOTE — Telephone Encounter (Signed)
Left message for patient to call back. Will see if patient is getting MRI that Dr. [name] ordered before her office visit with Dr. Johnsie Cancel.

## 2020-12-08 ENCOUNTER — Encounter: Payer: Self-pay | Admitting: Cardiovascular Disease

## 2020-12-08 DIAGNOSIS — I350 Nonrheumatic aortic (valve) stenosis: Secondary | ICD-10-CM | POA: Diagnosis not present

## 2020-12-09 NOTE — Telephone Encounter (Signed)
Left second message for patient to call back.

## 2020-12-09 NOTE — Telephone Encounter (Signed)
Patient called back. Patient stated that she had MRI with Dr. [name] yesterday. Patient stated Dr. [name] made a few medication changes. Patient stated she does not think she needs to be seen this week by Dr. Johnsie Cancel, but would like him to review Dr. Leland Her office visit notes and MRI, and let her know if she needs to come in at a later time. Patient canceled her appointment for this week.

## 2020-12-10 NOTE — Telephone Encounter (Signed)
Called patient back to let her know Dr. Johnsie Cancel will review report on Friday when he is at the [facility] office.

## 2020-12-10 NOTE — Telephone Encounter (Signed)
The patient called this morning and wanted to let Dr. Johnsie Cancel know that she dropped off a printed copy of the results at the [place] office.

## 2020-12-12 ENCOUNTER — Ambulatory Visit: Payer: Medicare Other | Admitting: Cardiovascular Disease

## 2020-12-15 ENCOUNTER — Ambulatory Visit: Payer: Medicare Other | Admitting: Cardiovascular Disease

## 2021-01-17 IMAGING — MG DIGITAL SCREENING BILAT W/ TOMO W/ CAD
6 of 10 series · 6 of 30 positions shown · non-contrast
Comparison: Previous exam(s).

CLINICAL DATA: Screening.

EXAM:
DIGITAL SCREENING BILATERAL MAMMOGRAM WITH TOMO AND CAD

[R MLO synth-2D (1 of 2)]
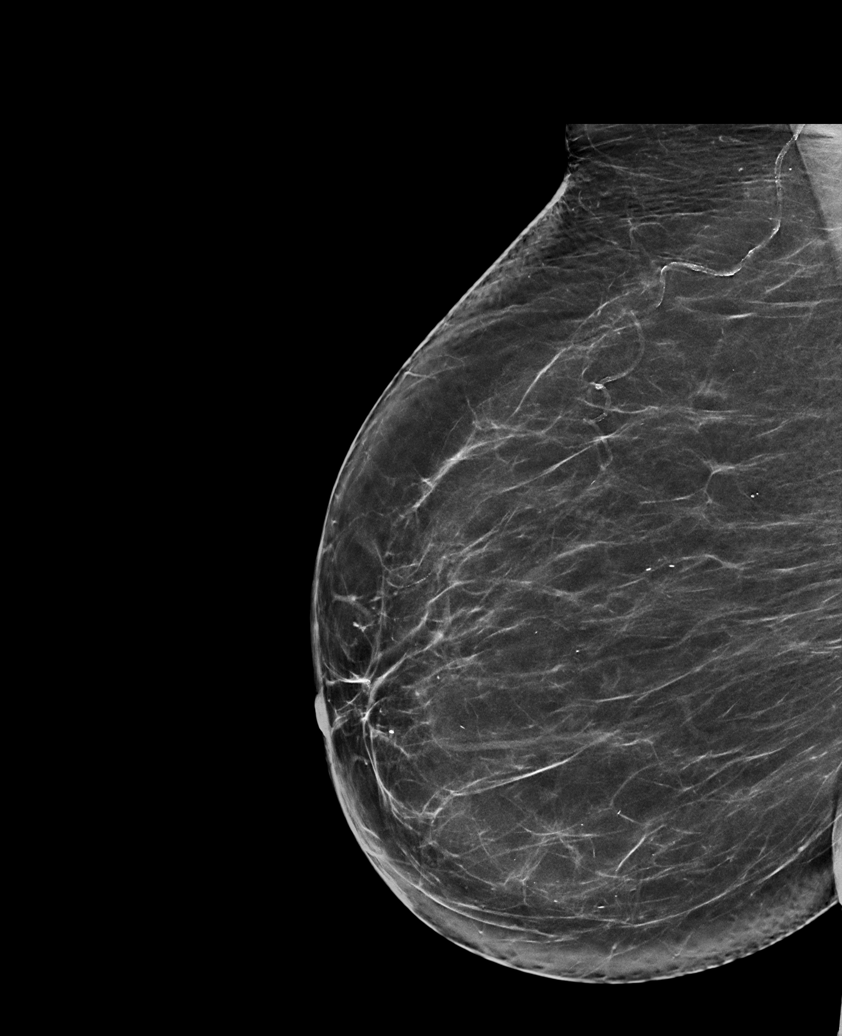

[L CC synth-2D]
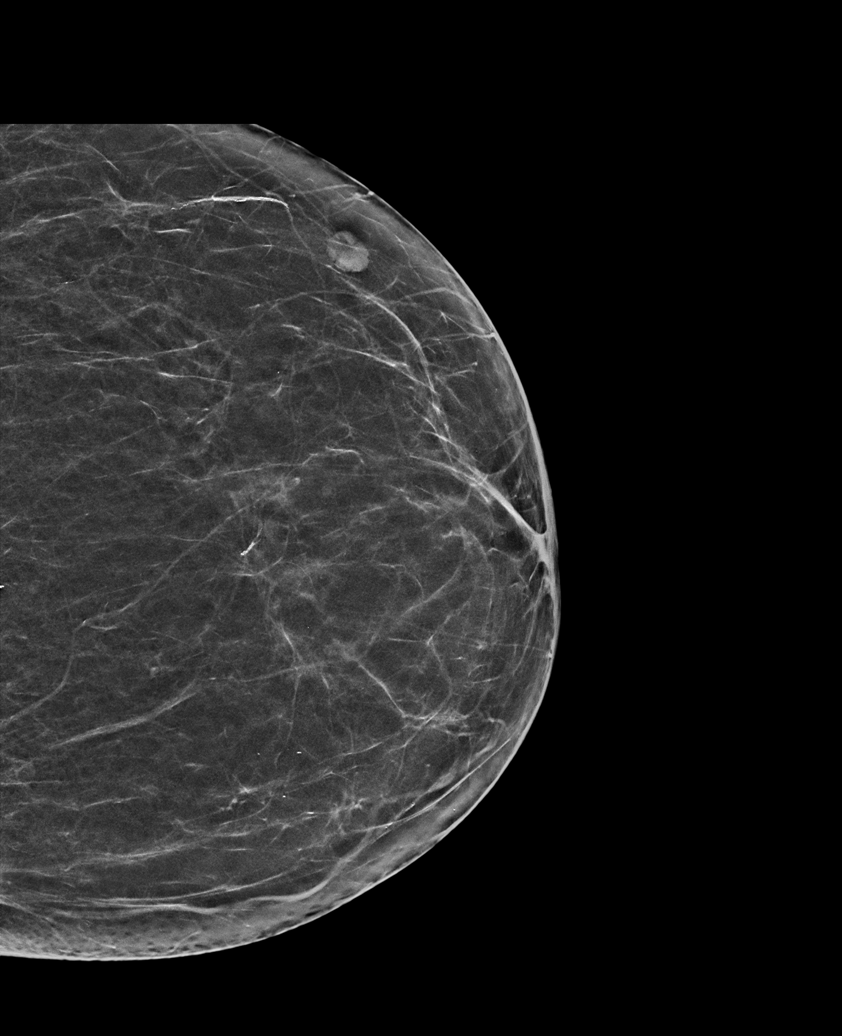

[R CC synth-2D]
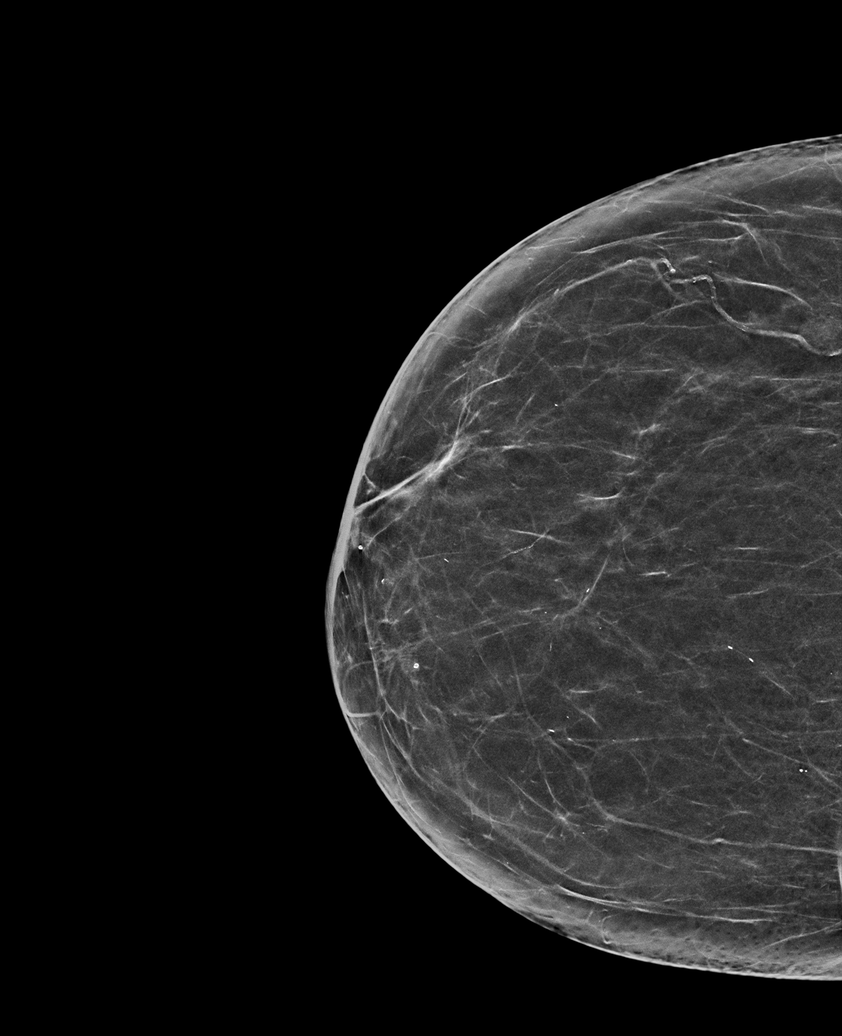

[R MLO synth-2D (2 of 2)]
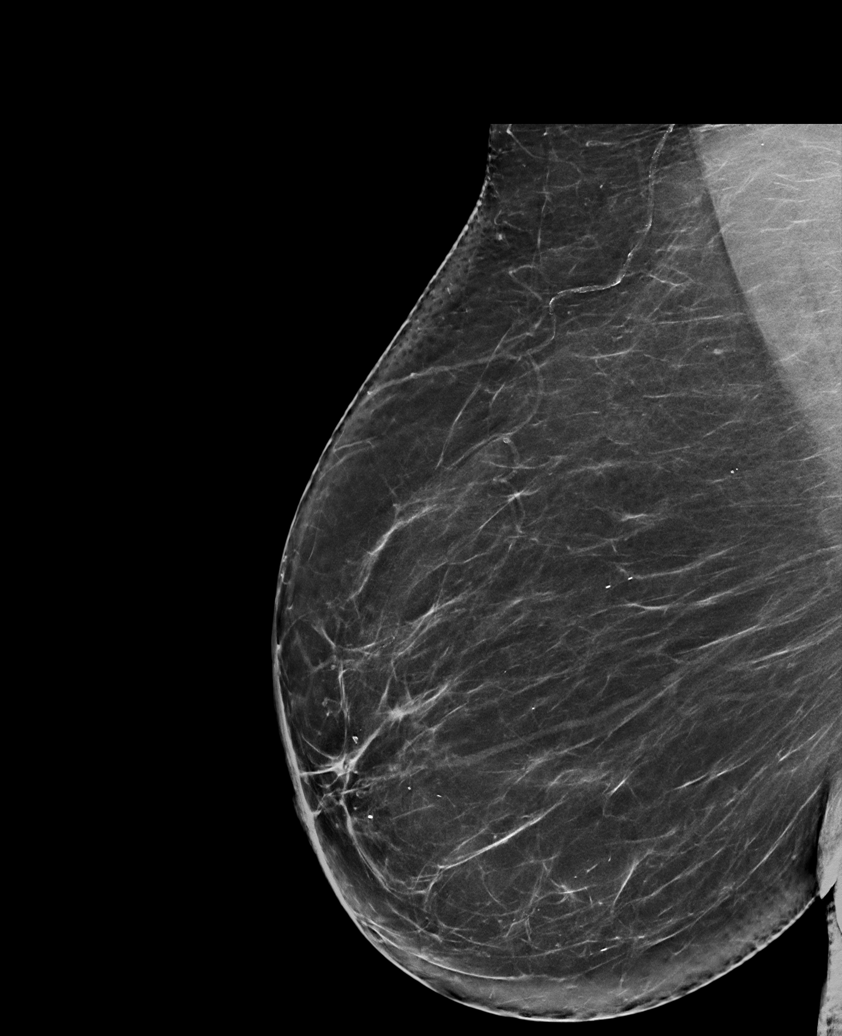

[L MLO synth-2D]
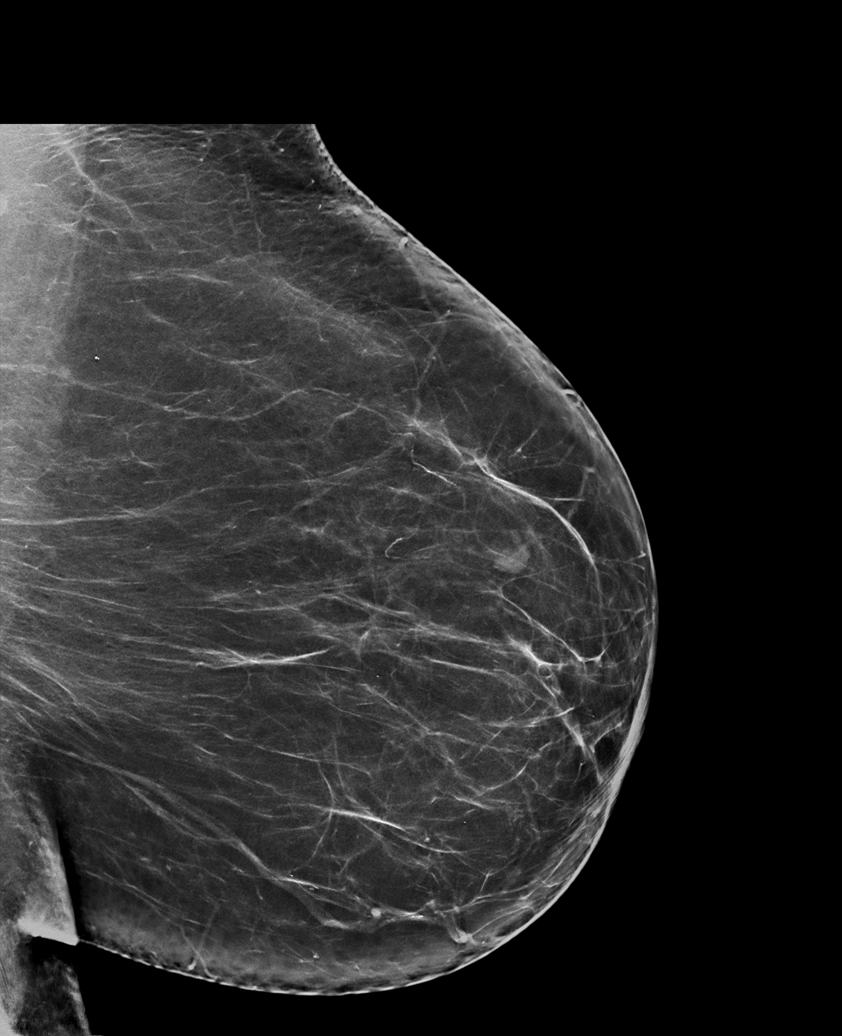

[R MLO tomo · tomo slice 40/79.0]
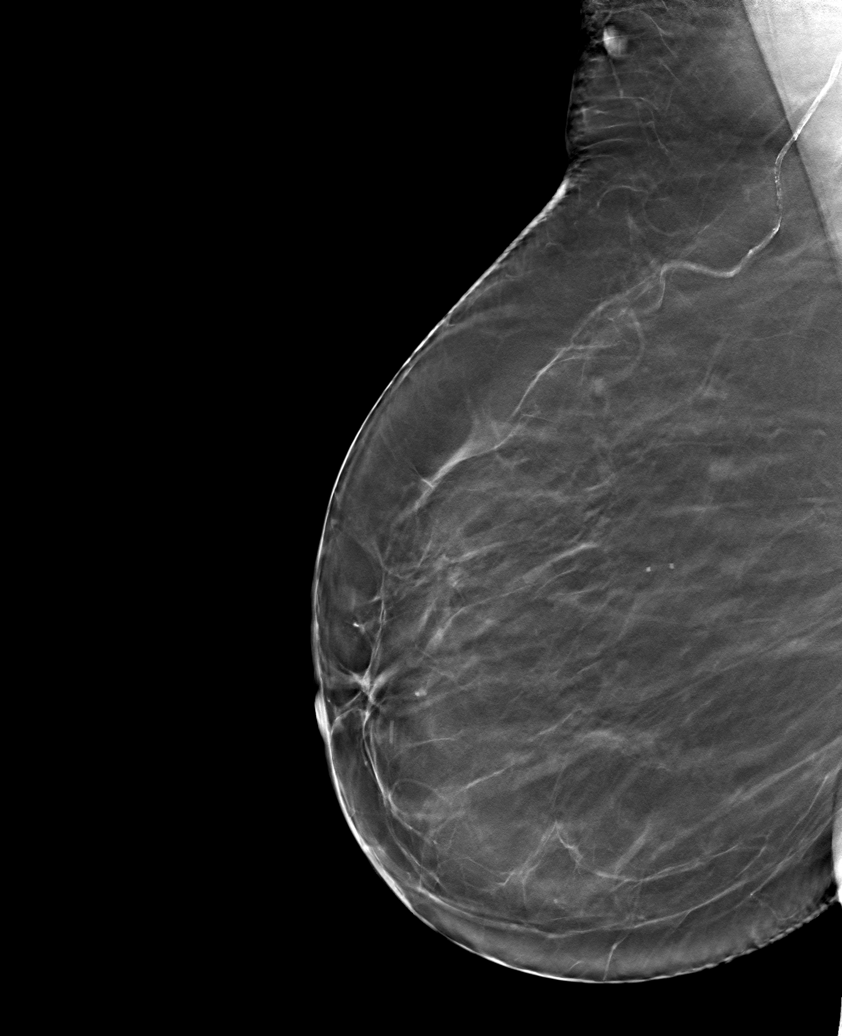

[6 of 30 positions shown; findings below may reference images not displayed]

ACR Breast Density Category b: There are scattered areas of
fibroglandular density.
FINDINGS: There are no findings suspicious for malignancy. Images were
processed with CAD.
IMPRESSION: No mammographic evidence of malignancy. A result letter of this
screening mammogram will be mailed directly to the patient.

RECOMMENDATION:
Screening mammogram in one year. (Code:[id number])

BI-RADS CATEGORY  1: Negative.

## 2021-01-19 ENCOUNTER — Ambulatory Visit: Payer: Medicare Other | Admitting: Cardiovascular Disease

## 2021-01-19 NOTE — Progress Notes (Signed)
CARDIOLOGY CONSULT NOTE       Patient ID: [name] MRN: [id number] DOB/AGE: May 26, 1950 70 y.o.  Admit date: (Not on file) Referring Physician: [name] Primary Physician: [name], MD Primary Cardiologist: [name] last seen 12/13/17 Reason for Consultation: PVC/Murmur    HPI:  70 y.o. 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 showed [place] septal thickness 2 cm EF 69% AVA 1.9 cm2  Moderate AS with 3.5 m/sec LVOT gradient gad uptake in septum consistent With HOCM   Shared decision making discussed Medical Rx due to minimal symptoms escalate with disopyramide or mavacamten AVR/septal myectomy surgery Alcohol septal ablation and TAVR  Spoke with Dr [name] personally x 2 on phone   Holter done in July with PVCls not quantitated and only one run NSVT 14 beats   She has a diagnosis of hemachromatosis. Had seen heme onc in past and had 3 months of phlebotomy Up until  recently she donated blood every couple of months on her own so she wouldn't have to pay for phlebotomy She needs f/u for this Cardiac MRI at [facility] does not mention cardiac involvement but don't know if T2* imaging done   She has not been taking cardizem Needs to be on one and discussed verapamil     ROS All other systems reviewed and negative except as noted above  Past Medical History:  Diagnosis Date   Cervical incompetence    Hay fever    Heart murmur    Hemochromatosis    Hypertension    Migraines    Sciatica     Family History  Problem Relation Age of Onset   Heart attack Paternal Grandfather    Migraines Maternal Grandmother    Heart attack Maternal Grandfather    Heart attack Father    Heart murmur Father    Cancer Mother        pancreatic cancer   Heart murmur Brother    Breast cancer Maternal Aunt     Social History   Socioeconomic History   Marital status: Married    Spouse name: Not on file   Number of children: Not on file   Years of education: Not on file   Highest education level: Not on file  Occupational History   Not on file  Tobacco Use   Smoking status: Former    Packs/day: 0.10  Types: Cigarettes    Start date: 09/16/1962    Quit date: 09/15/1969    Years since quitting: 51.3   Smokeless tobacco: Never   Tobacco comments:    as a teenager  Vaping Use   Vaping Use: Never used  Substance and Sexual Activity   Alcohol use: Not Currently    Comment:  1-2 month   Drug use: No   Sexual activity: Not Currently    Partners: Female    Birth control/protection: Post-menopausal    Comment: BTL  Other Topics Concern   Not on file  Social History Narrative   Not on file   Social Determinants of Health   Financial Resource Strain: Not on file  Food Insecurity: Not on file  Transportation Needs: Not on file  Physical Activity: Not on file  Stress: Not on file  Social Connections: Not on file  Intimate Partner Violence: Not on file     Past Surgical History:  Procedure Laterality Date   CARPAL TUNNEL RELEASE Right    COLONOSCOPY N/A 08/01/2014   Procedure: COLONOSCOPY;  Surgeon: Rogene Houston, MD;  [facility];  Service: Endoscopy;  Laterality: N/A;  900 -- moved to 10:00 - Ann notified pt   ELBOW SURGERY     RIGHT/LEFT HEART CATH AND CORONARY ANGIOGRAPHY N/A 07/28/2020   Procedure: RIGHT/LEFT HEART CATH AND CORONARY ANGIOGRAPHY;  Surgeon: Sherren Mocha, MD;  Location: [place] CV LAB;  Service: Cardiovascular;  Laterality: N/A;   ROTATOR CUFF REPAIR Right 2012   TONSILLECTOMY     TUBAL LIGATION        Current Outpatient Medications:    ezetimibe (ZETIA) 10 MG tablet, Take 10 mg by mouth daily., Disp: , Rfl:    fluticasone (FLONASE) 50 MCG/ACT nasal spray, Place 1-2 sprays into both nostrils as needed for allergies or rhinitis. , Disp: , Rfl:    losartan (COZAAR) 25 MG tablet, Take 1 tablet by mouth daily., Disp: , Rfl:    metFORMIN (GLUCOPHAGE) 500 MG tablet, Take 500 mg by mouth 2 (two) times daily., Disp: , Rfl:    metoprolol succinate (TOPROL-XL) 50 MG 24 hr tablet, Take 25 mg by mouth daily., Disp: , Rfl:    omeprazole (PRILOSEC) 20 MG capsule, Take 20 mg by mouth daily. , Disp: , Rfl: 3   zolpidem (AMBIEN) 10 MG tablet, Take 5 mg by mouth at bedtime., Disp: , Rfl: 2   diltiazem (TIAZAC) 180 MG 24 hr capsule, Take 180 mg by mouth at bedtime., Disp: , Rfl:    miconazole (MICOTIN) 2 % cream, Apply 1 application topically 2 (two) times daily. Under breast, Disp: , Rfl:    naproxen sodium (ANAPROX) 220 MG tablet, Take 220-440 mg by mouth daily as needed (headaches)., Disp: , Rfl:    olmesartan-hydrochlorothiazide (BENICAR HCT) 40-25 MG tablet, Take 1 tablet by mouth daily., Disp: , Rfl:    propranolol (INDERAL) 10 MG tablet, Take 10 mg by mouth as needed (tachycardia)., Disp: , Rfl:    rosuvastatin (CRESTOR) 5 MG tablet, Take 5 mg by mouth daily., Disp: , Rfl:     Physical Exam: Height 5\' 8"  (1.727  m), weight 107 kg, last menstrual period 04/26/1998.    Affect appropriate Healthy:  appears stated age 32: normal Neck supple with no adenopathy JVP normal no bruits no thyromegaly Lungs clear with no wheezing and good diaphragmatic motion Heart:  S1/S2 SEM slightly worse with valsalva , no rub, gallop or click PMI normal Abdomen: benighn,  BS positve, no tenderness, no AAA no bruit.  No HSM or HJR Distal pulses intact with no bruits No edema Neuro non-focal Skin warm and dry No muscular weakness   Labs:   Lab Results  Component Value Date   WBC 11.0 (H) 07/22/2020   HGB 11.2 (L) 07/28/2020   HCT 33.0 (L) 07/28/2020   MCV 88 07/22/2020   PLT 290 07/22/2020      Radiology: No results found.  EKG: 2019 SR rate 90 normal QT poor R wave progression 05/21/20 SR rate 68 LAD poor R wave progression 01/23/2021 SR rate 76 LVH    ASSESSMENT AND PLAN:   1. PVC:  Asymptomatic in setting of AS/HOCM check 48 hour holter for frequency and NSVT 2. AS/HOCM:   Minimal symptoms continue lopressor add verapamil  3. HTN:  Well controlled.  Continue current medications and low sodium Dash type diet.   4. HLD:  On statin labs with primary   5. Hemachromatosis:  refer to Heme/Oncology for labs and ferritin no recent ones in Epic 4 years ago as high as 1106 MRI at [place] does not mention any cardiac involvement  Verapamil 120 mg LA daily Refer to Heme/Onc F/U 3 months  Cardiac CTA TAVR   Signed: Jenkins Rouge 01/23/2021, 11:06 AM

## 2021-01-23 ENCOUNTER — Other Ambulatory Visit: Payer: Self-pay

## 2021-01-23 ENCOUNTER — Encounter: Payer: Self-pay | Admitting: Cardiovascular Disease

## 2021-01-23 ENCOUNTER — Ambulatory Visit: Payer: Medicare Other | Admitting: Cardiovascular Disease

## 2021-01-23 DIAGNOSIS — E782 Mixed hyperlipidemia: Secondary | ICD-10-CM | POA: Diagnosis not present

## 2021-01-23 DIAGNOSIS — I421 Obstructive hypertrophic cardiomyopathy: Secondary | ICD-10-CM | POA: Diagnosis not present

## 2021-01-23 DIAGNOSIS — I350 Nonrheumatic aortic (valve) stenosis: Secondary | ICD-10-CM

## 2021-01-23 MED ORDER — VERAPAMIL HCL ER 120 MG PO TBCR: 120.0000 mg | EXTENDED_RELEASE_TABLET | Freq: Every day | ORAL | 11 refills | Status: DC

## 2021-01-23 NOTE — Patient Instructions (Signed)
Medication Instructions:   Start Verapamil 120 mg Daily   *If you need a refill on your cardiac medications before your next appointment, please call your pharmacy*   Lab Work: NONE   If you have labs (blood work) drawn today and your tests are completely normal, you will receive your results only by: [place] (if you have MyChart) OR A paper copy in the mail If you have any lab test that is abnormal or we need to change your treatment, we will call you to review the results.   Testing/Procedures: NONE    Follow-Up: At [facility], you and your health needs are our priority.  As part of our continuing mission to provide you with exceptional heart care, we have created designated Provider Care Teams.  These Care Teams include your primary Cardiologist (physician) and Advanced Practice Providers (APPs -  Physician Assistants and Nurse Practitioners) who all work together to provide you with the care you need, when you need it.  We recommend signing up for the patient portal called "MyChart".  Sign up information is provided on this After Visit Summary.  MyChart is used to connect with patients for Virtual Visits (Telemedicine).  Patients are able to view lab/test results, encounter notes, upcoming appointments, etc.  Non-urgent messages can be sent to your provider as well.   To learn more about what you can do with MyChart, go to [email or web address].    Your next appointment:   3 month(s)  The format for your next appointment:   In Person  Provider:   Jenkins Rouge, MD   Other Instructions Thank you for choosing [facility]!

## 2021-01-30 ENCOUNTER — Inpatient Hospital Stay (HOSPITAL_COMMUNITY): Payer: Medicare Other | Admitting: Hematology

## 2021-02-13 ENCOUNTER — Inpatient Hospital Stay (HOSPITAL_COMMUNITY): Payer: Medicare Other | Attending: Hematology | Admitting: Hematology

## 2021-02-13 ENCOUNTER — Encounter (HOSPITAL_COMMUNITY): Payer: Self-pay

## 2021-04-20 NOTE — Progress Notes (Signed)
CARDIOLOGY CONSULT NOTE       Patient ID: [name] MRN: [id number] DOB/AGE: October 27, 1950 70 y.o.  Admit date: (Not on file) Referring Physician: [name] Primary Physician: [name], MD Primary Cardiologist: [name] last seen 12/13/17 Reason for Consultation: PVC/Murmur    HPI:  70 y.o. 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  Cath Dr Burt Knack sowed no significant CAD with 3 good septal perforators That could be used for alcohol septal ablation Pull back gradient was negligible But post PVC gradient was 81 mmHg AV suggesting primary physiologic issue Is HOCM   Not thought to be a primary initial TAVR candidate due To sub aortic gradient Seen at [facility] by Dr [name] Meds adjusted   MRI done at [facility] showed [place] septal thickness 2 cm EF 69% AVA 1.9 cm2  Moderate AS with 3.5 m/sec LVOT gradient gad uptake in septum consistent With HOCM   Shared decision making discussed Medical Rx due to minimal symptoms escalate with disopyramide or mavacamten AVR/septal myectomy surgery Alcohol septal ablation and TAVR  Spoke with Dr [name] personally x 2 on phone   Holter done in July with PVCls not quantitated and only one run NSVT 14 beats   She has a diagnosis of hemachromatosis. Had seen heme onc in past and had 3 months of phlebotomy Up until  recently she donated blood every couple of months on her own so she wouldn't have to pay for phlebotomy She needs f/u for this Cardiac MRI at [facility] does not mention cardiac involvement but don't know if T2* imaging done   Discussed increasing verapamil to 240 mg since BP still up.   She has chronic right knee pain with failed injections Wants to have TKR with Dr [name] She has no CAD. She is at risk for PAF and CHF but ok to proceed We can be available at [facility] if needed post operatively Also discussed seeing [facility] for discussion of Mavacamten Rx in addition to her beta blocker and verapamil    ROS All other systems reviewed and negative except as noted above  Past Medical History:  Diagnosis Date   Cervical incompetence    Hay fever    Heart murmur    Hemochromatosis    Hypertension    Migraines    Sciatica     Family History  Problem Relation Age of Onset   Heart attack Paternal Grandfather    Migraines Maternal Grandmother    Heart attack Maternal Grandfather    Heart attack Father    Heart murmur Father    Cancer Mother        pancreatic cancer   Heart murmur Brother    Breast cancer Maternal Aunt     Social History   Socioeconomic History   Marital status: Married  Spouse name: Not on file   Number of children: Not on file   Years of education: Not on file   Highest education level: Not on file  Occupational History   Not on file  Tobacco Use   Smoking status: Former    Packs/day: 0.10    Types: Cigarettes    Start date: 09/16/1962    Quit date: 09/15/1969    Years since quitting: 51.6   Smokeless tobacco: Never   Tobacco comments:    as a teenager  Vaping Use   Vaping Use: Never used  Substance and Sexual Activity   Alcohol use: Not Currently    Comment:  1-2 month   Drug use: No   Sexual activity: Not Currently    Partners: Female    Birth control/protection: Post-menopausal    Comment: BTL  Other Topics Concern   Not on file  Social History Narrative    Not on file   Social Determinants of Health   Financial Resource Strain: Not on file  Food Insecurity: Not on file  Transportation Needs: Not on file  Physical Activity: Not on file  Stress: Not on file  Social Connections: Not on file  Intimate Partner Violence: Not on file    Past Surgical History:  Procedure Laterality Date   CARPAL TUNNEL RELEASE Right    COLONOSCOPY N/A 08/01/2014   Procedure: COLONOSCOPY;  Surgeon: Rogene Houston, MD;  [facility];  Service: Endoscopy;  Laterality: N/A;  900 -- moved to 10:00 - Ann notified pt   ELBOW SURGERY     RIGHT/LEFT HEART CATH AND CORONARY ANGIOGRAPHY N/A 07/28/2020   Procedure: RIGHT/LEFT HEART CATH AND CORONARY ANGIOGRAPHY;  Surgeon: Sherren Mocha, MD;  Location: [place] CV LAB;  Service: Cardiovascular;  Laterality: N/A;   ROTATOR CUFF REPAIR Right 2012   TONSILLECTOMY     TUBAL LIGATION        Current Outpatient Medications:    diltiazem (TIAZAC) 180 MG 24 hr capsule, Take 180 mg by mouth at bedtime., Disp: , Rfl:    ezetimibe (ZETIA) 10 MG tablet, Take 10 mg by mouth daily., Disp: , Rfl:    fluticasone (FLONASE) 50 MCG/ACT nasal spray, Place 1-2 sprays into both nostrils as needed for allergies or rhinitis. , Disp: , Rfl:    losartan (COZAAR) 25 MG tablet, Take 1 tablet by mouth daily., Disp: , Rfl:    metFORMIN (GLUCOPHAGE) 500 MG tablet, Take 500 mg by mouth 2 (two) times daily., Disp: , Rfl:    metoprolol succinate (TOPROL-XL) 50 MG 24 hr tablet, Take 25 mg by mouth daily., Disp: , Rfl:    omeprazole (PRILOSEC) 20 MG capsule, Take 20 mg by mouth daily. , Disp: , Rfl: 3   verapamil (CALAN-SR) 120 MG CR tablet, Take 1 tablet (120 mg total) by mouth at bedtime., Disp: 30 tablet, Rfl: 11   zolpidem (AMBIEN) 10 MG tablet, Take 5 mg by mouth at bedtime., Disp: , Rfl: 2    Physical Exam: Blood pressure (!) 170/80, pulse 69, height 5\' 8"  (1.727 m), weight 238 lb (108 kg), last menstrual period 04/26/1998, SpO2 96  %.    Affect appropriate Healthy:  appears stated age 34: normal Neck supple with no adenopathy JVP normal no bruits no thyromegaly Lungs clear with no wheezing and good diaphragmatic motion Heart:  S1/S2 SEM slightly worse with valsalva , no rub, gallop or click PMI normal Abdomen: benighn, BS positve, no tenderness, no AAA no bruit.  No HSM or HJR Distal pulses intact with no bruits No edema Neuro non-focal Skin warm and dry No muscular weakness   Labs:   Lab Results  Component Value Date   WBC 11.0 (H) 07/22/2020   HGB 11.2 (L) 07/28/2020   HCT 33.0 (L) 07/28/2020   MCV 88 07/22/2020   PLT 290 07/22/2020      Radiology: No results found.  EKG: 2019 SR rate 90 normal QT poor R wave progression 05/21/20 SR rate 68 LAD poor R wave progression 04/24/2021 SR rate 76 LVH    ASSESSMENT AND PLAN:   1. PVC:  Asymptomatic in setting of AS/HOCM   2. AS/HOCM:   Minimal symptoms continue lopressor add verapamil increase she defers more aggressive Rx such as surgery or septal ablation with TAVR Will proceed with TAVR CT evaluation to see if she is a candidate. Will see [facility] for possible use of mavacamten Symptomatically she is ok Has f/u with Dr [name] at [facility] as well  3. HTN:  Well controlled.  Continue current medications and low sodium Dash type diet.   4. HLD:  On statin labs with primary   5. Hemachromatosis:  refer to Heme/Oncology for labs and ferritin no recent ones in Epic 4 years ago as high as 1106 MRI at [place] does not mention any cardiac involvement  Verapamil 240 mg mg LA daily Cardiac CTA TAVR  Refer [facility] HOCM clinic   F/U me 6 months   Signed: Jenkins Rouge 04/24/2021, 10:15 AM

## 2021-04-24 ENCOUNTER — Other Ambulatory Visit (HOSPITAL_COMMUNITY)
Admission: RE | Admit: 2021-04-24 | Discharge: 2021-04-24 | Disposition: A | Discharge status: Home / Self Care | Payer: Medicare Other | Source: Ambulatory Visit | Attending: Cardiovascular Disease | Admitting: Cardiovascular Disease

## 2021-04-24 ENCOUNTER — Ambulatory Visit: Payer: Medicare Other | Admitting: Cardiovascular Disease

## 2021-04-24 ENCOUNTER — Encounter: Payer: Self-pay | Admitting: Cardiovascular Disease

## 2021-04-24 ENCOUNTER — Other Ambulatory Visit: Payer: Self-pay

## 2021-04-24 VITALS — BP 170/80 | HR 69 | Ht 68.0 in | Wt 238.0 lb

## 2021-04-24 DIAGNOSIS — Z01818 Encounter for other preprocedural examination: Secondary | ICD-10-CM | POA: Diagnosis not present

## 2021-04-24 DIAGNOSIS — I350 Nonrheumatic aortic (valve) stenosis: Secondary | ICD-10-CM | POA: Diagnosis not present

## 2021-04-24 DIAGNOSIS — I421 Obstructive hypertrophic cardiomyopathy: Secondary | ICD-10-CM | POA: Diagnosis not present

## 2021-04-24 DIAGNOSIS — R002 Palpitations: Secondary | ICD-10-CM | POA: Diagnosis not present

## 2021-04-24 LAB — BASIC METABOLIC PANEL
Anion gap: 9 (ref 5–15)
BUN: 20 mg/dL (ref 8–23)
CO2: 26 mmol/L (ref 22–32)
Calcium: 8.9 mg/dL (ref 8.9–10.3)
Chloride: 102 mmol/L (ref 98–111)
Creatinine, Ser: 1.04 mg/dL — ABNORMAL HIGH (ref 0.44–1.00)
GFR, Estimated: 57 mL/min — ABNORMAL LOW (ref >60)
Glucose, Bld: 187 mg/dL — ABNORMAL HIGH (ref 70–99)
Potassium: 3.9 mmol/L (ref 3.5–5.1)
Sodium: 137 mmol/L (ref 135–145)

## 2021-04-24 MED ORDER — VERAPAMIL HCL ER 240 MG PO TBCR: 240.0000 mg | EXTENDED_RELEASE_TABLET | Freq: Every day | ORAL | 3 refills | Status: DC

## 2021-04-24 NOTE — Patient Instructions (Signed)
Medication Instructions:   Increase Verapamil to 240 mg Daily   *If you need a refill on your cardiac medications before your next appointment, please call your pharmacy*   Lab Work: Your physician recommends that you return for lab work in: today BMET   If you have labs (blood work) drawn today and your tests are completely normal, you will receive your results only by: [employer] (if you have MyChart) OR A paper copy in the mail If you have any lab test that is abnormal or we need to change your treatment, we will call you to review the results.   Testing/Procedures: Non-Cardiac CT scanning, (CAT scanning), is a noninvasive, special x-ray that produces cross-sectional images of the body using x-rays and a computer. CT scans help physicians diagnose and treat medical conditions. For some CT exams, a contrast material is used to enhance visibility in the area of the body being studied. CT scans provide greater clarity and reveal more details than regular x-ray exams.    Follow-Up: At [facility], you and your health needs are our priority.  As part of our continuing mission to provide you with exceptional heart care, we have created designated Provider Care Teams.  These Care Teams include your primary Cardiologist (physician) and Advanced Practice Providers (APPs -  Physician Assistants and Nurse Practitioners) who all work together to provide you with the care you need, when you need it.  We recommend signing up for the patient portal called "MyChart".  Sign up information is provided on this After Visit Summary.  MyChart is used to connect with patients for Virtual Visits (Telemedicine).  Patients are able to view lab/test results, encounter notes, upcoming appointments, etc.  Non-urgent messages can be sent to your provider as well.   To learn more about what you can do with MyChart, go to [email or web address].    Your next appointment:   3 month(s)  The format for your  next appointment:   In Person  Provider:   Jenkins Rouge, MD    Other Instructions Thank you for choosing [facility]!

## 2021-05-04 ENCOUNTER — Encounter: Payer: Self-pay | Admitting: Cardiovascular Disease

## 2021-05-06 ENCOUNTER — Ambulatory Visit (HOSPITAL_COMMUNITY): Payer: Medicare Other

## 2021-05-07 DIAGNOSIS — M170 Bilateral primary osteoarthritis of knee: Secondary | ICD-10-CM | POA: Diagnosis not present

## 2021-05-08 ENCOUNTER — Telehealth: Payer: Self-pay

## 2021-05-08 NOTE — Telephone Encounter (Signed)
I contacted Ms Lezcano in regards to arranging TAVR protocol CT scans, these were ordered during 04/24/21 office visit with Dr Johnsie Cancel.  Per the pt she does not wish to schedule CT scans with our team.  The pt states that she is followed at [facility] too and would like to discuss her heart issues with that MD.  Per Care Everywhere the pt has seen Dr [name] at [facility] and she has an upcoming appointment on 2/13 with Dr Cloretta Ned.  The pt said she would contact our office if she wants to proceed with CT scans in the future.

## 2021-05-20 ENCOUNTER — Ambulatory Visit: Payer: Medicare Other | Admitting: Internal Medicine

## 2021-06-02 DIAGNOSIS — E782 Mixed hyperlipidemia: Secondary | ICD-10-CM | POA: Diagnosis not present

## 2021-06-02 DIAGNOSIS — N182 Chronic kidney disease, stage 2 (mild): Secondary | ICD-10-CM | POA: Diagnosis not present

## 2021-06-02 DIAGNOSIS — R002 Palpitations: Secondary | ICD-10-CM | POA: Diagnosis not present

## 2021-06-02 DIAGNOSIS — E119 Type 2 diabetes mellitus without complications: Secondary | ICD-10-CM | POA: Diagnosis not present

## 2021-06-02 DIAGNOSIS — R011 Cardiac murmur, unspecified: Secondary | ICD-10-CM | POA: Diagnosis not present

## 2021-06-02 DIAGNOSIS — E7849 Other hyperlipidemia: Secondary | ICD-10-CM | POA: Diagnosis not present

## 2021-06-02 DIAGNOSIS — D239 Other benign neoplasm of skin, unspecified: Secondary | ICD-10-CM | POA: Diagnosis not present

## 2021-06-02 DIAGNOSIS — Q248 Other specified congenital malformations of heart: Secondary | ICD-10-CM | POA: Diagnosis not present

## 2021-06-02 DIAGNOSIS — R350 Frequency of micturition: Secondary | ICD-10-CM | POA: Diagnosis not present

## 2021-06-07 DIAGNOSIS — I422 Other hypertrophic cardiomyopathy: Secondary | ICD-10-CM | POA: Insufficient documentation

## 2021-06-08 DIAGNOSIS — I422 Other hypertrophic cardiomyopathy: Secondary | ICD-10-CM | POA: Diagnosis not present

## 2021-06-08 DIAGNOSIS — I350 Nonrheumatic aortic (valve) stenosis: Secondary | ICD-10-CM | POA: Diagnosis not present

## 2021-06-18 DIAGNOSIS — H43393 Other vitreous opacities, bilateral: Secondary | ICD-10-CM | POA: Diagnosis not present

## 2021-07-28 NOTE — Progress Notes (Incomplete)
CARDIOLOGY CONSULT NOTE  ? ? ? ? ? ?Patient ID: ?[name] ?MRN: [id number] ?DOB/AGE: [birth year] 71 y.o. ? ?Admit date: (Not on file) ?Referring Physician: [name] ?Primary Physician: [name], MD ?Primary Cardiologist: [name] last seen 12/13/17 ?Reason for Consultation: PVC/Murmur  ? ? ?HPI:  71 y.o. 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 ?Lab review shows LDL 84 Hct 39 Cr 1.15 K 3.9  ? ?She is doing well no real palpitations since November ?No syncope chest pain or dyspnea ?No high risk family history of HOCM, syncope or arrhythmias ? 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  ?Cath 07/28/20 Dr Burt Knack sowed no significant CAD with 3 good septal perforators ?That could be used for alcohol septal ablation Pull back gradient was negligible ?But post PVC gradient was 81 mmHg AV suggesting primary physiologic issue ?Is HOCM   Not thought to be a primary initial TAVR candidate due ?To sub aortic gradient Seen at [facility] by Dr [name] Meds adjusted  ? 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 ?Shared decision making discussed ?Medical Rx due to minimal symptoms escalate with disopyramide or mavacamten ?AVR/septal myectomy surgery ?Alcohol septal ablation and TAVR ? ?Spoke with Dr [name] personally x 2 on phone  ? ?Holter done in July with PVCls not quantitated and only one run NSVT 14 beats  ? ?She has a diagnosis of hemachromatosis. Had seen heme onc in past and had 3 months of phlebotomy Up  until recently she donated blood every couple of months on her own so she wouldn't have to pay for phlebotomy She needs f/u for this Cardiac MRI at [facility] does not mention cardiac involvement but don't know if T2* imaging done  ? ?Discussed increasing verapamil to 240 mg since BP still up.  ? ?She has chronic right knee pain with failed injections Wants to have TKR with Dr [name] ?She has no CAD. She is at risk for PAF and CHF but ok to proceed We can be available at [facility] if needed post operatively Also discussed seeing [facility] for discussion of Mavacamten Rx in addition to her beta blocker and verapamil ? ?She declined having TAVR CTls and was following with Dr Edwin Dada and [name] at [facility]  MRI there August 2022 with EF 69% gad uptake in septum < 3% myocardial volume AVA 1.7 cm2 with peak velocity across AV 3.7 m/sec and LVOT 3.9 m/sec  ? ?*** ?  ? ?ROS ?All other systems reviewed and negative except as noted above ? ?Past Medical History:  ?Diagnosis Date  ?? Cervical incompetence   ?? Hay fever   ?? Heart murmur   ?? Hemochromatosis   ?? Hypertension   ?? Migraines   ?? Sciatica   ?  ?Family History  ?Problem Relation Age of Onset  ?? Heart attack Paternal Grandfather   ?? Migraines Maternal Grandmother   ?? Heart attack Maternal Grandfather   ??  Heart attack Father   ?? Heart murmur Father   ?? Cancer Mother   ?     pancreatic cancer  ?? Heart murmur Brother   ?? Breast cancer Maternal Aunt   ?  ?Social History  ? ?Socioeconomic History  ?? Marital status: Married  ?  Spouse name: Not on file  ?? Number of children: Not on file  ?? Years of education: Not on file  ?? Highest education level: Not on file  ?Occupational History  ?? Not on file  ?Tobacco Use  ?? Smoking status: Former  ?  Packs/day: 0.10  ?  Types: Cigarettes  ?  Start date: 09/16/1962  ?  Quit date: 09/15/1969  ?  Years since quitting: 51.9  ?? Smokeless tobacco: Never  ?? Tobacco comments:  ?  as a teenager  ?Vaping Use  ?? Vaping Use: Never used  ?Substance and  Sexual Activity  ?? Alcohol use: Not Currently  ?  Comment:  1-2 month  ?? Drug use: No  ?? Sexual activity: Not Currently  ?  Partners: Female  ?  Birth control/protection: Post-menopausal  ?  Comment: BTL  ?Other Topics Concern  ?? Not on file  ?Social History Narrative  ?? Not on file  ? ?Social Determinants of Health  ? ?Financial Resource Strain: Not on file  ?Food Insecurity: Not on file  ?Transportation Needs: Not on file  ?Physical Activity: Not on file  ?Stress: Not on file  ?Social Connections: Not on file  ?Intimate Partner Violence: Not on file  ?  ?Past Surgical History:  ?Procedure Laterality Date  ?? CARPAL TUNNEL RELEASE Right   ?? COLONOSCOPY N/A 08/01/2014  ? Procedure: COLONOSCOPY;  Surgeon: Rogene Houston, MD;  [facility];  Service: Endoscopy;  Laterality: N/A;  900 -- moved to 10:00 - Ann notified pt  ?? ELBOW SURGERY    ?? RIGHT/LEFT HEART CATH AND CORONARY ANGIOGRAPHY N/A 07/28/2020  ? Procedure: RIGHT/LEFT HEART CATH AND CORONARY ANGIOGRAPHY;  Surgeon: Sherren Mocha, MD;  Location: [place] [facility];  Service: Cardiovascular;  Laterality: N/A;  ?? ROTATOR CUFF REPAIR Right 2012  ?? TONSILLECTOMY    ?? TUBAL LIGATION    ?  ? ? ?Current Outpatient Medications:  ??  diltiazem (TIAZAC) 180 MG 24 hr capsule, Take 180 mg by mouth at bedtime., Disp: , Rfl:  ??  ezetimibe (ZETIA) 10 MG tablet, Take 10 mg by mouth daily., Disp: , Rfl:  ??  fluticasone (FLONASE) 50 MCG/ACT nasal spray, Place 1-2 sprays into both nostrils as needed for allergies or rhinitis. , Disp: , Rfl:  ??  losartan (COZAAR) 25 MG tablet, Take 1 tablet by mouth daily., Disp: , Rfl:  ??  metFORMIN (GLUCOPHAGE) 500 MG tablet, Take 500 mg by mouth 2 (two) times daily., Disp: , Rfl:  ??  metoprolol succinate (TOPROL-XL) 50 MG 24 hr tablet, Take 25 mg by mouth daily., Disp: , Rfl:  ??  omeprazole (PRILOSEC) 20 MG capsule, Take 20 mg by mouth daily. , Disp: , Rfl: 3 ??  verapamil (CALAN-SR) 240 MG CR tablet, Take 1 tablet  (240 mg total) by mouth at bedtime., Disp: 90 tablet, Rfl: 3 ??  zolpidem (AMBIEN) 10 MG tablet, Take 5 mg by mouth at bedtime., Disp: , Rfl: 2 ? ? ? ?Physical Exam: ?Last menstrual period 04/26/1998.   ? ?Affect appropriate ?Healthy:  appears stated age ?HEENT: normal ?Neck supple with no adenopathy ?JVP normal no bruits no thyromegaly ?Lungs clear  with no wheezing and good diaphragmatic motion ?Heart:  S1/S2 SEM slightly worse with valsalva , no rub, gallop or click ?PMI normal ?Abdomen: benighn, BS positve, no tenderness, no AAA ?no bruit.  No HSM or HJR ?Distal pulses intact with no bruits ?No edema ?Neuro non-focal ?Skin warm and dry ?No muscular weakness ? ? ?Labs: ?  ?Lab Results  ?Component Value Date  ? WBC 11.0 (H) 07/22/2020  ? HGB 11.2 (L) 07/28/2020  ? HCT 33.0 (L) 07/28/2020  ? MCV 88 07/22/2020  ? PLT 290 07/22/2020  ?  ?  ?Radiology: ?No results found. ? ?EKG: 2019 SR rate 90 normal QT poor R wave progression 05/21/20 SR rate 68 LAD poor R wave progression 07/28/2021 SR rate 76 LVH  ? ? ?ASSESSMENT AND PLAN:  ? ?1. PVC:  Asymptomatic in setting of AS/HOCM   ?2. AS/HOCM:   Minimal symptoms continue lopressor and verapamil increase she defers more aggressive Rx such as surgery or septal ablation with TAVR Will proceed with TAVR CT evaluation to see if she is a candidate. Will see [facility] for possible use of mavacamten Symptomatically she is ok Has f/u with Dr Magdalene Patricia  at [facility] as well They did not feel that primary prevention AICD needed either  ?3. HTN:  Well controlled.  Continue current medications and low sodium Dash type diet.   ?4. HLD:  On statin labs with primary   ?5. Hemachromatosis:  refer to Heme/Oncology for labs and ferritin no recent ones in Epic 4 years ago as high as 1106 MRI at [facility] does not mention any cardiac involvement ? ?Verapamil 240 mg mg LA daily ?Refer [facility] HOCM clinic  ? ?F/U me 6 months  ? ?Signed: ?Jenkins Rouge ?07/28/2021, 2:06 PM ? ? ?

## 2021-07-29 DIAGNOSIS — M1711 Unilateral primary osteoarthritis, right knee: Secondary | ICD-10-CM | POA: Diagnosis not present

## 2021-08-03 ENCOUNTER — Ambulatory Visit: Payer: Medicare Other | Admitting: Cardiovascular Disease

## 2021-08-05 ENCOUNTER — Encounter: Payer: Self-pay | Admitting: Cardiovascular Disease

## 2021-08-06 DIAGNOSIS — E782 Mixed hyperlipidemia: Secondary | ICD-10-CM | POA: Diagnosis not present

## 2021-08-06 DIAGNOSIS — Q248 Other specified congenital malformations of heart: Secondary | ICD-10-CM | POA: Diagnosis not present

## 2021-08-06 DIAGNOSIS — E119 Type 2 diabetes mellitus without complications: Secondary | ICD-10-CM | POA: Diagnosis not present

## 2021-08-06 DIAGNOSIS — R002 Palpitations: Secondary | ICD-10-CM | POA: Diagnosis not present

## 2021-08-06 DIAGNOSIS — R011 Cardiac murmur, unspecified: Secondary | ICD-10-CM | POA: Diagnosis not present

## 2021-08-06 DIAGNOSIS — Z01818 Encounter for other preprocedural examination: Secondary | ICD-10-CM | POA: Diagnosis not present

## 2021-08-27 ENCOUNTER — Telehealth: Payer: Self-pay

## 2021-08-27 NOTE — Telephone Encounter (Signed)
? ?  Pre-operative Risk Assessment  ?  ?Patient Name: [name]  ?DOB: 12/12/1950 ?MRN: [id number]  ? ?  ? ?Request for Surgical Clearance   ? ?Procedure:   Right Total Knee Arthroplasty ? ?Date of Surgery:  Clearance 10/13/21                              ?   ?Surgeon:  Dr Paralee Cancel ?Surgeon's Group or Practice Name:  [facility] ?Phone number:  [phone] Orson Slick) ?Fax number:  [phone] ?  ?Type of Clearance Requested:   ?- Medical  ?  ?Type of Anesthesia:  Spinal ?  ?Additional requests/questions:   n/a ? ?Signed, ?Ulice Brilliant T   ?08/27/2021, 11:18 AM  ?

## 2021-08-27 NOTE — Telephone Encounter (Addendum)
? ? ? ? ? ?  Patient Name: [name]  ?DOB: 1950-07-11 ?MRN: [id number] ? ?Primary Cardiologist: Jenkins Rouge, MD ? ?Chart reviewed as part of pre-operative protocol coverage. Pre-op clearance already addressed by colleagues at office visit on 04/24/2021 with Dr. Johnsie Cancel ? ?Patient was contacted by phone and she stated that she is doing well with no acute changes since her last appointment in December.  She denies any chest pain or shortness of breath. ? ?Will route this bundled recommendation to requesting provider via Epic fax function and remove from pre-op pool. Please call with questions. ? ?Linda Picket, NP ?08/27/2021, 1:28 PM  ?

## 2021-08-27 NOTE — Telephone Encounter (Signed)
Received a message from Jaquelyn Bitter statuing he spoke with Dr Johnsie Cancel and the patient does not need to come in for another appointment; due to Dr Johnsie Cancel clearing her at her last appointment. Contacted the patient and made her aware. She voiced understanding.  ?

## 2021-08-27 NOTE — Telephone Encounter (Addendum)
Contacted the patient and informed her that a follow up is needed for clearance. Patient asked if the appointment is necessary or if we are bring her in because the office wants her too. I explained to the patient that cardiac clearance is needed. Patient stated that she needed to be seen as soon as possible. Patient states at her last appointment Dr Johnsie Cancel told her she was cleared for surgery and she doesn't know why she needs to be seen again. I advised the patient that she could see a APP, but she only wanted to see her doctor. Appointment scheduled and patient agreeable.  ? ?Will route back to pre-op for provider advice. ?

## 2021-08-27 NOTE — Telephone Encounter (Signed)
? ?  Name: [name]  ?DOB: 1950-09-04  ?MRN: [id number] ? ?Primary Cardiologist: Jenkins Rouge, MD ? ?Chart reviewed as part of pre-operative protocol coverage. Because of [name]'s past medical history and time since last visit, she will require a follow-up in-office visit in order to better assess preoperative cardiovascular risk.   ? ?Pre-op covering staff: ?- Please schedule appointment and call patient to inform them. If patient already had an upcoming appointment within acceptable timeframe, please add "pre-op clearance" to the appointment notes so provider is aware. ?- Please contact requesting surgeon's office via preferred method (i.e, phone, fax) to inform them of need for appointment prior to surgery. ? ? ?[name], Marissa Nestle, NP  ?08/27/2021, 11:36 AM   ?

## 2021-09-07 ENCOUNTER — Ambulatory Visit: Payer: Medicare Other | Admitting: Cardiovascular Disease

## 2021-09-14 ENCOUNTER — Encounter: Payer: Self-pay | Admitting: Cardiovascular Disease

## 2021-09-14 MED ORDER — HYDROCHLOROTHIAZIDE 25 MG PO TABS: 25.0000 mg | ORAL_TABLET | Freq: Every day | ORAL | 3 refills | Status: DC | PRN

## 2021-09-30 NOTE — Patient Instructions (Addendum)
DUE TO COVID-19 ONLY TWO VISITORS  (aged 73 and older)  IS ALLOWED TO COME WITH YOU AND STAY IN THE WAITING ROOM ONLY DURING PRE OP AND PROCEDURE.   **NO VISITORS ARE ALLOWED IN THE SHORT STAY AREA OR RECOVERY ROOM!!**  IF YOU WILL BE ADMITTED INTO THE HOSPITAL YOU ARE ALLOWED ONLY FOUR SUPPORT PEOPLE DURING VISITATION HOURS ONLY (7 AM -8PM)   The support person(s) must pass our screening, gel in and out Visitors GUEST BADGE MUST BE WORN VISIBLY  One adult visitor may remain with you overnight and MUST be in the room by 8 P.M.   You are not required to [employer] often Do NOT share personal items Notify your provider if you are in close contact with someone who has COVID or you develop fever 100.4 or greater, new onset of sneezing, cough, sore throat, shortness of breath or body aches.        Your procedure is scheduled on:  10-13-21   Report to [facility] Main Entrance    Report to admitting at 7:15 AM   Call this number if you have problems the morning of surgery [phone]   Do not eat food :After Midnight the night before surgery   After Midnight you may have the following liquids until 7:00 AM  DAY OF SURGERY  Clear Liquid Diet Water Black Coffee (sugar ok, NO MILK/CREAM OR CREAMERS)  Tea (sugar ok, NO MILK/CREAM OR CREAMERS) regular and decaf                             Plain Jell-O (NO RED)                                           Fruit ices (not with fruit pulp, NO RED)                                     Popsicles (NO RED)                                                                  Juice: apple, WHITE grape, WHITE cranberry Sports drinks like Gatorade (NO RED) Clear broth(vegetable,chicken,beef)                   The day of surgery:  Drink ONE (1) Pre-Surgery  G2 at 7:00 AM the morning of surgery. Drink in one sitting. Do not sip.  This drink was given to you during your hospital  pre-op appointment visit. Nothing else to drink after  completing the Pre-SurgeryG2          If you have questions, please contact your surgeon's office.   FOLLOW  ANY ADDITIONAL PRE OP INSTRUCTIONS YOU RECEIVED FROM YOUR SURGEON'S OFFICE!!!     Oral Hygiene is also important to reduce your risk of infection.  Remember - BRUSH YOUR TEETH THE MORNING OF SURGERY WITH YOUR REGULAR TOOTHPASTE   Do NOT smoke after Midnight   Take these medicines the morning of surgery with A SIP OF WATER: Ezetimbe (Zetia), Omeprazole (Prilosec)   DO NOT BRING YOUR HOME MEDICATIONS TO [place]. PHARMACY WILL DISPENSE MEDICATIONS LISTED ON YOUR MEDICATION LIST TO YOU DURING YOUR ADMISSION [place]!  How to Manage Your Diabetes Before and After Surgery  Why is it important to control my blood sugar before and after surgery? Improving blood sugar levels before and after surgery helps healing and can limit problems. A way of improving blood sugar control is eating a healthy diet by:  Eating less sugar and carbohydrates  Increasing activity/exercise  Talking with your doctor about reaching your blood sugar goals High blood sugars (greater than 180 mg/dL) can raise your risk of infections and slow your recovery, so you will need to focus on controlling your diabetes during the weeks before surgery. Make sure that the doctor who takes care of your diabetes knows about your planned surgery including the date and location.  How do I manage my blood sugar before surgery? Check your blood sugar at least 4 times a day, starting 2 days before surgery, to make sure that the level is not too high or low. Check your blood sugar the morning of your surgery when you wake up and every 2 hours until you get to the Short Stay unit. If your blood sugar is less than 70 mg/dL, you will need to treat for low blood sugar: Do not take insulin. Treat a low blood sugar (less than 70 mg/dL) with  cup of clear juice (cranberry or apple), 4  glucose tablets, OR glucose gel. Recheck blood sugar in 15 minutes after treatment (to make sure it is greater than 70 mg/dL). If your blood sugar is not greater than 70 mg/dL on recheck, call [phone] for further instructions. Report your blood sugar to the short stay nurse when you get to Short Stay.  If you are admitted to the hospital after surgery: Your blood sugar will be checked by the staff and you will probably be given insulin after surgery (instead of oral diabetes medicines) to make sure you have good blood sugar levels. The goal for blood sugar control after surgery is 80-180 mg/dL.   WHAT DO I DO ABOUT MY DIABETES MEDICATION?  Do not take oral diabetes medicines (pills) the morning of surgery.  THE DAY BEFORE SURGERY:  Take Metformin as prescribed.       THE MORNING OF SURGERY:  Do not take Metformin.  Reviewed and Endorsed by [facility] Patient Education Committee, August 2015                               You may not have any metal on your body including hair pins, jewelry, and body piercing             Do not wear make-up, lotions, powders, perfumes, or deodorant  Do not wear nail polish including gel and S&S, artificial/acrylic nails, or any other type of covering on natural nails including finger and toenails. If you have artificial nails, gel coating, etc. that needs to be removed by a nail salon please have this removed prior to surgery or surgery may need to be canceled/ delayed if the surgeon/ anesthesia feels like they are unable to be safely monitored.   Do  not shave  48 hours prior to surgery.    Contacts, dentures or bridgework may not be worn into surgery.   Bring small overnight bag day of surgery.  Do not bring valuables to the hospital. [place].   Please read over the following fact sheets you were given: IF YOU HAVE QUESTIONS ABOUT YOUR PRE-OP INSTRUCTIONS PLEASE CALL [place] -  Preparing for Surgery Before surgery, you can play an important role.  Because skin is not sterile, your skin needs to be as free of germs as possible.  You can reduce the number of germs on your skin by washing with CHG (chlorahexidine gluconate) soap before surgery.  CHG is an antiseptic cleaner which kills germs and bonds with the skin to continue killing germs even after washing. Please DO NOT use if you have an allergy to CHG or antibacterial soaps.  If your skin becomes reddened/irritated stop using the CHG and inform your nurse when you arrive at Short Stay. Do not shave (including legs and underarms) for at least 48 hours prior to the first CHG shower.  You may shave your face/neck.  Please follow these instructions carefully:  1.  Shower with CHG Soap the night before surgery and the  morning of surgery.  2.  If you choose to wash your hair, wash your hair first as usual with your normal  shampoo.  3.  After you shampoo, rinse your hair and body thoroughly to remove the shampoo.                             4.  Use CHG as you would any other liquid soap.  You can apply chg directly to the skin and wash.  Gently with a scrungie or clean washcloth.  5.  Apply the CHG Soap to your body ONLY FROM THE NECK DOWN.   Do   not use on face/ open                           Wound or open sores. Avoid contact with eyes, ears mouth and   genitals (private parts).                       Wash face,  Genitals (private parts) with your normal soap.             6.  Wash thoroughly, paying special attention to the area where your    surgery  will be performed.  7.  Thoroughly rinse your body with warm water from the neck down.  8.  DO NOT shower/wash with your normal soap after using and rinsing off the CHG Soap.                9.  Pat yourself dry with a clean towel.            10.  Wear clean pajamas.            11.  Place clean sheets on your bed the night of your first shower and do not  sleep with  pets. Day of Surgery : Do not apply any lotions/deodorants the morning of surgery.  Please wear clean clothes to the hospital/surgery center.  FAILURE TO FOLLOW THESE INSTRUCTIONS MAY RESULT IN THE CANCELLATION OF YOUR SURGERY  PATIENT SIGNATURE_________________________________  NURSE SIGNATURE__________________________________  ________________________________________________________________________      WHAT IS A BLOOD TRANSFUSION? Blood Transfusion Information  A transfusion is the replacement of blood or some of its parts. Blood is made up of multiple cells which provide different functions. Red blood cells carry oxygen and are used for blood loss replacement. White blood cells fight against infection. Platelets control bleeding. Plasma helps clot blood. Other blood products are available for specialized needs, such as hemophilia or other clotting disorders. BEFORE THE TRANSFUSION  Who gives blood for transfusions?  Healthy volunteers who are fully evaluated to make sure their blood is safe. This is blood bank blood. Transfusion therapy is the safest it has ever been in the practice of medicine. Before blood is taken from a donor, a complete history is taken to make sure that person has no history of diseases nor engages in risky social behavior (examples are intravenous drug use or sexual activity with multiple partners). The donor's travel history is screened to minimize risk of transmitting infections, such as malaria. The donated blood is tested for signs of infectious diseases, such as HIV and hepatitis. The blood is then tested to be sure it is compatible with you in order to minimize the chance of a transfusion reaction. If you or a relative donates blood, this is often done in anticipation of surgery and is not appropriate for emergency situations. It takes many days to process the donated blood. RISKS AND COMPLICATIONS Although transfusion therapy is very safe and saves many  lives, the main dangers of transfusion include:  Getting an infectious disease. Developing a transfusion reaction. This is an allergic reaction to something in the blood you were given. Every precaution is taken to prevent this. The decision to have a blood transfusion has been considered carefully by your caregiver before blood is given. Blood is not given unless the benefits outweigh the risks. AFTER THE TRANSFUSION Right after receiving a blood transfusion, you will usually feel much better and more energetic. This is especially true if your red blood cells have gotten low (anemic). The transfusion raises the level of the red blood cells which carry oxygen, and this usually causes an energy increase. The nurse administering the transfusion will monitor you carefully for complications. HOME CARE INSTRUCTIONS  No special instructions are needed after a transfusion. You may find your energy is better. Speak with your caregiver about any limitations on activity for underlying diseases you may have. SEEK MEDICAL CARE IF:  Your condition is not improving after your transfusion. You develop redness or irritation at the intravenous (IV) site. SEEK IMMEDIATE MEDICAL CARE IF:  Any of the following symptoms occur over the next 12 hours: Shaking chills. You have a temperature by mouth above 102 F (38.9 C), not controlled by medicine. Chest, back, or muscle pain. People around you feel you are not acting correctly or are confused. Shortness of breath or difficulty breathing. Dizziness and fainting. You get a rash or develop hives. You have a decrease in urine output. Your urine turns a dark color or changes to pink, red, or brown. Any of the following symptoms occur over the next 10 days: You have a temperature by mouth above 102 F (38.9 C), not controlled by medicine. Shortness of breath. Weakness after normal activity. The white part of the eye turns yellow (jaundice). You have a decrease in  the amount of urine or are urinating less often. Your urine turns a dark color or changes to pink, red, or brown. Document Released: 04/09/2000 Document  Revised: 07/05/2011 Document Reviewed: 11/27/2007 ExitCare Patient Information 2014 [place].  _______________________________________________________________________     Incentive Spirometer    An incentive spirometer is a tool that can help keep your lungs clear and active. This tool measures how well you are filling your lungs with each breath. Taking long deep breaths may help reverse or decrease the chance of developing breathing (pulmonary) problems (especially infection) following: A long period of time when you are unable to move or be active. BEFORE THE PROCEDURE  If the spirometer includes an indicator to show your best effort, your nurse or respiratory therapist will set it to a desired goal. If possible, sit up straight or lean slightly forward. Try not to slouch. Hold the incentive spirometer in an upright position. INSTRUCTIONS FOR USE  Sit on the edge of your bed if possible, or sit up as far as you can in bed or on a chair. Hold the incentive spirometer in an upright position. Breathe out normally. Place the mouthpiece in your mouth and seal your lips tightly around it. Breathe in slowly and as deeply as possible, raising the piston or the ball toward the top of the column. Hold your breath for 3-5 seconds or for as long as possible. Allow the piston or ball to fall to the bottom of the column. Remove the mouthpiece from your mouth and breathe out normally. Rest for a few seconds and repeat Steps 1 through 7 at least 10 times every 1-2 hours when you are awake. Take your time and take a few normal breaths between deep breaths. The spirometer may include an indicator to show your best effort. Use the indicator as a goal to work toward during each repetition. After each set of 10 deep breaths, practice coughing to be  sure your lungs are clear. If you have an incision (the cut made at the time of surgery), support your incision when coughing by placing a pillow or rolled up towels firmly against it. Once you are able to get out of bed, walk around indoors and cough well. You may stop using the incentive spirometer when instructed by your caregiver.  RISKS AND COMPLICATIONS Take your time so you do not get dizzy or light-headed. If you are in pain, you may need to take or ask for pain medication before doing incentive spirometry. It is harder to take a deep breath if you are having pain. AFTER USE Rest and breathe slowly and easily. It can be helpful to keep track of a log of your progress. Your caregiver can provide you with a simple table to help with this. If you are using the spirometer at home, follow these instructions: [place] IF:  You are having difficultly using the spirometer. You have trouble using the spirometer as often as instructed. Your pain medication is not giving enough relief while using the spirometer. You develop fever of 100.5 F (38.1 C) or higher.  SEEK IMMEDIATE MEDICAL CARE IF:  You cough up bloody sputum that had not been present before. You develop fever of 102 F (38.9 C) or greater. You develop worsening pain at or near the incision site. MAKE SURE YOU:  Understand these instructions. Will watch your condition. Will get help right away if you are not doing well or get worse. Document Released: 08/23/2006 Document Revised: 07/05/2011 Document Reviewed: 10/24/2006 [facility] Patient Information 2014 [place], [place].

## 2021-09-30 NOTE — Progress Notes (Addendum)
COVID Vaccine Completed:  Yes  Date of COVID positive in last 90 days:  No  PCP - [name], MD (office note requested) Cardiologist - Jenkins Rouge, MD  Cardiac clearance in Epic in telephone note dated 08-27-21 By Ewell Poe, NP  Chest x-ray - N/A EKG - 10-01-21 Epic Stress Test - N/A ECHO - 3-22 Epic Cardiac Cath - 07-28-20 Epic Pacemaker/ICD device last checked: Spinal Cord Stimulator: Holter Monitor  - 22  [facility] Cardiac MRI - 12-08-20 [facility]  Bowel Prep - N/A  Sleep Study - N/A CPAP -   Fasting Blood Sugar -  Checks Blood Sugar - does not check   Blood Thinner Instructions:  N/A Aspirin Instructions: Last Dose:  Activity level:   Can go up a flight of stairs and perform activities of daily living without  symptoms of chest pain .  Patietn has shortness of breath with exertion.  States that she does have to stop and rest when cleaning her home and has SOB with stairs.  Feels that she is at her baseline  Anesthesia review:  Aortic stenosis, hypertrophic cardiomyopathy, murmur, HTN  BP elevated at PAT 178/87 and 178/90 on recheck.  States at home she usually runs in the 543 range systolic but has not checked a few months.  Will monitor at home and notify PCP if remains elevated.  Patient denies shortness of breath, fever, cough and chest pain at PAT appointment  Patient verbalized understanding of instructions that were given to them at the PAT appointment. Patient was also instructed that they will need to review over the PAT instructions again at home before surgery.

## 2021-10-01 ENCOUNTER — Other Ambulatory Visit: Payer: Self-pay

## 2021-10-01 ENCOUNTER — Encounter (HOSPITAL_COMMUNITY)
Admission: RE | Admit: 2021-10-01 | Discharge: 2021-10-01 | Disposition: A | Discharge status: Home / Self Care | Payer: Medicare Other | Source: Ambulatory Visit | Attending: Orthopedic Surgery | Admitting: Orthopedic Surgery

## 2021-10-01 ENCOUNTER — Encounter (HOSPITAL_COMMUNITY): Payer: Self-pay

## 2021-10-01 VITALS — BP 178/90 | HR 69 | Temp 98.6°F | Resp 20 | Ht 68.0 in | Wt 229.4 lb

## 2021-10-01 DIAGNOSIS — Z8679 Personal history of other diseases of the circulatory system: Secondary | ICD-10-CM | POA: Insufficient documentation

## 2021-10-01 DIAGNOSIS — E119 Type 2 diabetes mellitus without complications: Secondary | ICD-10-CM | POA: Insufficient documentation

## 2021-10-01 DIAGNOSIS — Z01818 Encounter for other preprocedural examination: Secondary | ICD-10-CM | POA: Insufficient documentation

## 2021-10-01 DIAGNOSIS — I421 Obstructive hypertrophic cardiomyopathy: Secondary | ICD-10-CM | POA: Insufficient documentation

## 2021-10-01 DIAGNOSIS — I10 Essential (primary) hypertension: Secondary | ICD-10-CM | POA: Insufficient documentation

## 2021-10-01 DIAGNOSIS — I2510 Atherosclerotic heart disease of native coronary artery without angina pectoris: Secondary | ICD-10-CM

## 2021-10-01 DIAGNOSIS — M1711 Unilateral primary osteoarthritis, right knee: Secondary | ICD-10-CM | POA: Diagnosis not present

## 2021-10-01 HISTORY — DX: Unspecified osteoarthritis, unspecified site: M19.90

## 2021-10-01 HISTORY — DX: Type 2 diabetes mellitus without complications: E11.9

## 2021-10-01 HISTORY — DX: Gastro-esophageal reflux disease without esophagitis: K21.9

## 2021-10-01 HISTORY — DX: Family history of other specified conditions: Z84.89

## 2021-10-01 LAB — CBC
HCT: 39.6 % (ref 36.0–46.0)
Hemoglobin: 13.1 g/dL (ref 12.0–15.0)
MCH: 30.8 pg (ref 26.0–34.0)
MCHC: 33.1 g/dL (ref 30.0–36.0)
MCV: 93.2 fL (ref 80.0–100.0)
Platelets: 323 10*3/uL (ref 150–400)
RBC: 4.25 MIL/uL (ref 3.87–5.11)
RDW: 12.5 % (ref 11.5–15.5)
WBC: 14.2 10*3/uL — ABNORMAL HIGH (ref 4.0–10.5)
nRBC: 0 % (ref 0.0–0.2)

## 2021-10-01 LAB — BASIC METABOLIC PANEL
Anion gap: 12 (ref 5–15)
BUN: 21 mg/dL (ref 8–23)
CO2: 24 mmol/L (ref 22–32)
Calcium: 8.9 mg/dL (ref 8.9–10.3)
Chloride: 105 mmol/L (ref 98–111)
Creatinine, Ser: 1.02 mg/dL — ABNORMAL HIGH (ref 0.44–1.00)
GFR, Estimated: 58 mL/min — ABNORMAL LOW (ref >60)
Glucose, Bld: 115 mg/dL — ABNORMAL HIGH (ref 70–99)
Potassium: 3.7 mmol/L (ref 3.5–5.1)
Sodium: 141 mmol/L (ref 135–145)

## 2021-10-01 LAB — TYPE AND SCREEN
ABO/RH(D): B POS
Antibody Screen: NEGATIVE

## 2021-10-01 LAB — HEMOGLOBIN A1C
Hgb A1c MFr Bld: 6.8 % — ABNORMAL HIGH (ref 4.8–5.6)
Mean Plasma Glucose: 148.46 mg/dL

## 2021-10-01 LAB — SURGICAL PCR SCREEN
MRSA, PCR: NEGATIVE
Staphylococcus aureus: NEGATIVE

## 2021-10-01 LAB — GLUCOSE, CAPILLARY: Glucose-Capillary: 119 mg/dL — ABNORMAL HIGH (ref 70–99)

## 2021-10-02 NOTE — Progress Notes (Signed)
Anesthesia Chart Review:   Case: [id number] Date/Time: 10/13/21 0950   Procedure: TOTAL KNEE ARTHROPLASTY (Right: Knee)   Anesthesia type: Spinal   Pre-op diagnosis: Right knee osteoarthritis   Location: [facility] 09 / [facility]   Surgeons: Paralee Cancel, MD       DISCUSSION: Pt is 71 years old with hx moderate aortic stenosis, HOCM, HTN, DM, hemochromatosis   Status of AS/HOCM documented on 2022 cath: "suspect the pressure gradient is reflective of both LVOT and aortic valve pathology with a significant component of subaortic stenosis.Marland KitchenMarland Kitchen My initial impression is that TAVR alone would leave the patient with significant subvalvular stenosis and a large gradient without much clinical benefit."  Dr. [name] with cardiology at [facility] documented at last office visit in February: "Hypertrophic cardiomyopathy: NYHA class I symptoms. She has occasional palpitations but no presyncope or syncope. She remains on metoprolol and higher dose verapamil. No indication for surgical myectomy or aortic valve replacement at this time. We will plan repeat echo in 1 year. She has no risk factors for sudden cardiac arrest related to hypertrophic cardiomyopathy. No indication for primary prevention ICD"  Dr. Kyla Balzarine note 04/24/21 documents "Wants to have TKR with Dr [name] She has no CAD. She is at risk for PAF and CHF but ok to proceed We can be available at [facility] if needed post operatively"  Note by pre-surgical testing RN documents "Can go up a flight of stairs and perform activities of daily living without  symptoms of chest pain .  Patietn has shortness of breath with exertion.  States that she does have to stop and rest when cleaning her home and has SOB with stairs.  Feels that she is at her baseline"  BP elevated at pre-surgical testing: 178/87, 178/90. Pt instructed to monitor at home and notify PCP if BP remains elevated.    I spoke with pt by telephone. She clarified she does not feel her SOB is bothersome or changed  from usual. She is still able to walk her dog for 20 minutes at time. She says she might get a little SOB going up a hill but has none walking down the hill and that this is the same as when she last saw Dr. [name] in February. She reports she does have to stop some when walking but due to knee pain, not SOB.   She checked her BP at home yesterday and reading was 142/80's, which she reports is herr usual baseline BP.     VS: BP (!) 178/90   Pulse 69   Temp 37 C (Oral)   Resp 20   Ht '5\' 8"'$  (1.727 m)   Wt 104.1 kg   LMP 04/26/1998 (Within Years)   SpO2 97%   BMI 34.88 kg/m   PROVIDERS: - PCP is [name], MD - Cardiologist is Jenkins Rouge, MD. Last office visit 04/24/21 - Also sees Derinda Sis, MD with cardiology at [facility]. Last office visit 06/08/21 (notes in care everywhere). 1 year f/u recommended   LABS: Labs reviewed: Acceptable for surgery. (all labs ordered are listed, but only abnormal results are displayed)  Labs Reviewed  HEMOGLOBIN A1C - Abnormal; Notable for the following components:      Result Value   Hgb A1c MFr Bld 6.8 (*)    All other components within normal limits  BASIC METABOLIC PANEL - Abnormal; Notable for the following components:   Glucose, Bld 115 (*)    Creatinine, Ser 1.02 (*)    GFR, Estimated  58 (*)    All other components within normal limits  CBC - Abnormal; Notable for the following components:   WBC 14.2 (*)    All other components within normal limits  GLUCOSE, CAPILLARY - Abnormal; Notable for the following components:   Glucose-Capillary 119 (*)    All other components within normal limits  SURGICAL PCR SCREEN  TYPE AND SCREEN     EKG 10/01/21:  Sinus rhythm with frequent PVCs Left axis deviation Pulmonary disease pattern Incomplete RBBB Moderate voltage criteria for LVH, may be normal variant ( R in aVL , [facility] product )   CV: Cardiac MRI 12/08/20 (care everywhere): 1.  The left ventricle is normal in cavity size. There is  severe asymmetric LV hypertrophy, which predominantly affects the basal-to-mid inter-ventricular septum (maximum wall thickness of 2.0 cm). Global LV systolic function is hyperdynamic with near cavity obliteration at the apex and an LV ejection fraction calculated at 69%. There are no regional wall motion abnormalities.   2.  The right ventricle is normal in cavity size, wall thickness, and systolic function.   3.  The left atrium is mildly enlarged. The right atrium is normal in size.  4. The aortic valve is trileaflet in morphology with mild calcific -degenerative changes. The peak systolic aortic valve area by planimetry is 1.9 cm2. This is a peak systolic opening and is consistent with an average aortic valve opening measured by other modalities that is about 10-15% smaller (i.e the AVA measured by other modalities would be approximately 1.7 cm2). Peak aortic flow velocity measures 3.7 m/s.  These findings are consistent with moderate aortic stenosis. It is important to note, however, that it is not possible to rule out that some of these changes (i.e., decreased AVA and increased peak velocity at the aortic valve) may be at least in part secondary to the flow acceleration jet of LVOT obstruction (see below). There is mild aortic regurgitation.    5. There is flow acceleration in the LV outflow tract along with systolic anterior motion (SAM) of the mitral valve. The peak velocity at the LVOT is 3.5 m/sec without provocation. There is an eccentric jet of mild mitral regurgitation. There is also mild tricuspid regurgitation.   6. Delayed enhancement imaging is abnormal. There is patchy midwall fibrosis involving the basal-to-mid anteroseptal and inferoseptal walls, as well as the mid lateral wall. This is consistent with a diagnosis of hypertrophic cardiomyopathy. There is a small amount of scarring at about 3% of LV mass. There is no evidence of prior myocardial infarction, or other cardiomyopathy.    7.  No intracardiac thrombus visualized.   8. The pericardium is normal in thickness. There is no significant pericardial effusion.   Cardiac monitor 11/06/20 (care everywhere):  1) This 48-hour Holter scan was adequate for interpretation.    2) The patient was in sinus rhythm, sinus arrhythmia, sinus tachycardia, sinus bradycardia (strip ).   3) Ventricular ectopy consisted of multifocal PVCs (strips 5,19), including a couplet (strip 6), bigeminy (strip 9), trigeminy (strip 8). The longest/fastest runs was 4 beats 135 bpm at 6:40:24 PM (strip 10).   4)  Supraventricular ectopy consisted of PACs (strip 14), atrial pair (strip 15) bigeminy (strip 13), trigeminy(strip 20) .The longest run was  14 beats, 132 bpm at 10:03:58 AM (2) (strip 16). The fastest run was 3 beats, 138 bpm at 1:11:23 PM (2) (strip 18).   5) The maximum R-R interval was 4.024 seconds due to sinus pause  at 7:47:29 AM (2) (strip 3).     6) Symptoms of " Heat Change, light headed, Rapid heat change,Heart thumps, lightheaded, " were noted in diary with event button activation which correlated with sinus rhythm with PVCs (strip 7,11-12,17).  R/L cardiac cath 07/28/20:  1.  Mild distal left main narrowing with no significant stenosis, estimated at 30% 2.  Patent LAD with mild nonobstructive coronary disease 3.  Patent intermediate branch with no significant stenosis 4.  Patent circumflex and no significant stenosis 5.  Patent, nondominant RCA with mild diffuse nonobstructive plaquing 6.  Peak to peak transaortic valve gradient of 81 mmHg, peak instantaneous gradient 81 mmHg, mean gradient 52 mmHg - Discussion: 2 separate pullback gradients were measured.  The first pullback demonstrates no significant pressure gradient.  The second pullback which incorporates post PVC beats demonstrates a high gradient outlined above.  The aortic valve is crossed multiple times with a J-wire without any difficulty.  I suspect the pressure gradient  is reflective of both LVOT and aortic valve pathology with a significant component of subaortic stenosis.  There is marked augmentation of LV systolic pressure with post PVC beats.  Will review her case with our multidisciplinary heart valve team.  My initial impression is that TAVR alone would leave the patient with significant subvalvular stenosis and a large gradient without much clinical benefit.   Echo 06/27/20:  1. Left ventricular ejection fraction, by estimation, is 65 to 70%. The left ventricle has normal function. The left ventricle has no regional wall motion abnormalities. There is severe asymmetric left ventricular hypertrophy of the septal segment. Left ventricular diastolic parameters are consistent with Grade I diastolic dysfunction (impaired relaxation). LVOT is small with evidence of dynamic outflow obstruction (mean gradient 59 mmHg with Valsalva).  2. Right ventricular systolic function is normal. The right ventricular size is normal. There is normal pulmonary artery systolic pressure. The estimated right ventricular systolic pressure is 31.5 mmHg.  3. Left atrial size was moderately dilated.  4. The mitral valve is abnormal. Mild mitral valve regurgitation. Mild mitral stenosis. The mean mitral valve gradient is 6.0 mmHg. Moderate mitral annular calcification.  5. The aortic valve is tricuspid. There is moderate calcification of the aortic valve. Aortic valve regurgitation is mild. Severe aortic valve stenosis based on planimetry and mean gradient, although there is also dynamic outflow gradient in the LVOT. Aortic regurgitation PHT measures 509 msec. Aortic valve mean gradient measures 36.2 mmHg.  6. The inferior vena cava is normal in size with greater than 50% respiratory variability, suggesting right atrial pressure of 3 mmHg.    Past Medical History:  Diagnosis Date   Arthritis    Cervical incompetence    Diabetes mellitus without complication (HCC)    Family history of  adverse reaction to anesthesia    Mother with nausea   GERD (gastroesophageal reflux disease)    Hay fever    Heart murmur    Hemochromatosis    Hypertension    Migraines    Sciatica     Past Surgical History:  Procedure Laterality Date   CARPAL TUNNEL RELEASE Right    COLONOSCOPY N/A 08/01/2014   Procedure: COLONOSCOPY;  Surgeon: Rogene Houston, MD;  Location: [facility];  Service: Endoscopy;  Laterality: N/A;  900 -- moved to 10:00 - Ann notified pt   ELBOW SURGERY     RIGHT/LEFT HEART CATH AND CORONARY ANGIOGRAPHY N/A 07/28/2020   Procedure: RIGHT/LEFT HEART CATH AND CORONARY ANGIOGRAPHY;  Surgeon: Burt Knack,  Legrand Como, MD;  Location: [place] [facility];  Service: Cardiovascular;  Laterality: N/A;   ROTATOR CUFF REPAIR Right 2012   TONSILLECTOMY     TUBAL LIGATION     WISDOM TOOTH EXTRACTION      MEDICATIONS:  diltiazem (TIAZAC) 180 MG 24 hr capsule   ezetimibe (ZETIA) 10 MG tablet   fluticasone (FLONASE) 50 MCG/ACT nasal spray   hydrochlorothiazide (HYDRODIURIL) 25 MG tablet   hydrocortisone cream 1 %   losartan (COZAAR) 50 MG tablet   metFORMIN (GLUCOPHAGE) 1000 MG tablet   metoprolol succinate (TOPROL-XL) 50 MG 24 hr tablet   omeprazole (PRILOSEC) 20 MG capsule   verapamil (CALAN-SR) 240 MG CR tablet   zolpidem (AMBIEN) 10 MG tablet   No current facility-administered medications for this encounter.    If no changes, I anticipate pt can proceed with surgery as scheduled.   Willeen Cass, PhD, FNP-BC [facility]/Anesthesiology Phone: [phone] 10/02/2021 12:55 PM

## 2021-10-02 NOTE — Anesthesia Preprocedure Evaluation (Addendum)
Anesthesia Evaluation  Patient identified by MRN, date of birth, ID band Patient awake    Reviewed: Allergy & Precautions, NPO status , Patient's Chart, lab work & pertinent test results, reviewed documented beta blocker date and time   History of Anesthesia Complications (+) Family history of anesthesia reaction  Airway Mallampati: II  TM Distance: >3 FB Neck ROM: Full    Dental  (+) Teeth Intact, Dental Advisory Given, Implants,    Pulmonary former smoker,    Pulmonary exam normal breath sounds clear to auscultation       Cardiovascular hypertension, Pt. on medications and Pt. on home beta blockers + Valvular Problems/Murmurs AS  Rhythm:Regular Rate:Normal + Systolic murmurs    Neuro/Psych  Headaches,  Neuromuscular disease    GI/Hepatic Neg liver ROS, GERD  ,  Endo/Other  diabetes, Type 2, Oral Hypoglycemic AgentsObesity   Renal/GU negative Renal ROS     Musculoskeletal  (+) Arthritis ,   Abdominal   Peds  Hematology Plt 323k   Anesthesia Other Findings Day of surgery medications reviewed with the patient.  Reproductive/Obstetrics                           Anesthesia Physical Anesthesia Plan  ASA: 3  Anesthesia Plan: General   Post-op Pain Management: Tylenol PO (pre-op)* and Regional block*   Induction: Intravenous  PONV Risk Score and Plan: 3 and Dexamethasone and Ondansetron  Airway Management Planned: Oral ETT  Additional Equipment: ClearSight and Arterial line  Intra-op Plan:   Post-operative Plan: Extubation in OR  Informed Consent: I have reviewed the patients History and Physical, chart, labs and discussed the procedure including the risks, benefits and alternatives for the proposed anesthesia with the patient or authorized representative who has indicated his/her understanding and acceptance.     Dental advisory given  Plan Discussed with: CRNA  Anesthesia  Plan Comments: (See APP note by Durel Salts, FNP )       Anesthesia Quick Evaluation

## 2021-10-06 ENCOUNTER — Telehealth: Payer: Medicare Other | Admitting: Physician Assistant

## 2021-10-06 DIAGNOSIS — R3989 Other symptoms and signs involving the genitourinary system: Secondary | ICD-10-CM | POA: Diagnosis not present

## 2021-10-06 MED ORDER — CEPHALEXIN 500 MG PO CAPS: 500.0000 mg | ORAL_CAPSULE | Freq: Two times a day (BID) | ORAL | 0 refills | Status: DC

## 2021-10-06 NOTE — Progress Notes (Signed)
I have spent 5 minutes in review of e-visit questionnaire, review and updating patient chart, medical decision making and response to patient.   Matheo Rathbone Cody Terence Bart, PA-C    

## 2021-10-06 NOTE — Progress Notes (Signed)

## 2021-10-06 NOTE — Progress Notes (Signed)
I have spent 5 minutes in review of e-visit questionnaire, review and updating patient chart, medical decision making and response to patient.   Tanee Henery Cody Merek Niu, PA-C    

## 2021-10-12 ENCOUNTER — Encounter (HOSPITAL_COMMUNITY): Payer: Self-pay | Admitting: Orthopedic Surgery

## 2021-10-13 ENCOUNTER — Ambulatory Visit (HOSPITAL_BASED_OUTPATIENT_CLINIC_OR_DEPARTMENT_OTHER): Payer: Medicare Other | Admitting: Anesthesiology

## 2021-10-13 ENCOUNTER — Encounter (HOSPITAL_COMMUNITY)
Admission: RE | Disposition: A | Discharge status: Home / Self Care | Payer: Self-pay | Source: Ambulatory Visit | Attending: Orthopedic Surgery

## 2021-10-13 ENCOUNTER — Ambulatory Visit (HOSPITAL_COMMUNITY): Payer: Medicare Other | Admitting: Emergency Medicine

## 2021-10-13 ENCOUNTER — Other Ambulatory Visit: Payer: Self-pay

## 2021-10-13 ENCOUNTER — Observation Stay (HOSPITAL_COMMUNITY)
Admission: RE | Admit: 2021-10-13 | Discharge: 2021-10-14 | Disposition: A | Discharge status: Home / Self Care | Payer: Medicare Other | Source: Ambulatory Visit | Attending: Orthopedic Surgery | Admitting: Orthopedic Surgery

## 2021-10-13 ENCOUNTER — Telehealth: Payer: Self-pay | Admitting: Cardiovascular Disease

## 2021-10-13 ENCOUNTER — Encounter (HOSPITAL_COMMUNITY): Payer: Self-pay | Admitting: Orthopedic Surgery

## 2021-10-13 DIAGNOSIS — I10 Essential (primary) hypertension: Secondary | ICD-10-CM | POA: Diagnosis not present

## 2021-10-13 DIAGNOSIS — Z87891 Personal history of nicotine dependence: Secondary | ICD-10-CM | POA: Diagnosis not present

## 2021-10-13 DIAGNOSIS — E119 Type 2 diabetes mellitus without complications: Secondary | ICD-10-CM

## 2021-10-13 DIAGNOSIS — M1711 Unilateral primary osteoarthritis, right knee: Secondary | ICD-10-CM | POA: Diagnosis not present

## 2021-10-13 DIAGNOSIS — Z96651 Presence of right artificial knee joint: Secondary | ICD-10-CM

## 2021-10-13 DIAGNOSIS — Z79899 Other long term (current) drug therapy: Secondary | ICD-10-CM | POA: Insufficient documentation

## 2021-10-13 DIAGNOSIS — Z7984 Long term (current) use of oral hypoglycemic drugs: Secondary | ICD-10-CM

## 2021-10-13 DIAGNOSIS — G8918 Other acute postprocedural pain: Secondary | ICD-10-CM | POA: Diagnosis not present

## 2021-10-13 DIAGNOSIS — I2510 Atherosclerotic heart disease of native coronary artery without angina pectoris: Secondary | ICD-10-CM

## 2021-10-13 DIAGNOSIS — Z01818 Encounter for other preprocedural examination: Secondary | ICD-10-CM

## 2021-10-13 HISTORY — PX: TOTAL KNEE ARTHROPLASTY: SHX125

## 2021-10-13 LAB — GLUCOSE, CAPILLARY
Glucose-Capillary: 176 mg/dL — ABNORMAL HIGH (ref 70–99)
Glucose-Capillary: 219 mg/dL — ABNORMAL HIGH (ref 70–99)
Glucose-Capillary: 305 mg/dL — ABNORMAL HIGH (ref 70–99)

## 2021-10-13 LAB — ABO/RH: ABO/RH(D): B POS

## 2021-10-13 SURGERY — ARTHROPLASTY, KNEE, TOTAL
Anesthesia: General | Site: Knee | Laterality: Right

## 2021-10-13 MED ORDER — ROCURONIUM BROMIDE 10 MG/ML (PF) SYRINGE
PREFILLED_SYRINGE | INTRAVENOUS | Status: AC
  Filled 2021-10-13: qty 10

## 2021-10-13 MED ORDER — FENTANYL CITRATE PF 50 MCG/ML IJ SOSY: 25.0000 ug | PREFILLED_SYRINGE | INTRAMUSCULAR | Status: DC | PRN

## 2021-10-13 MED ORDER — EPHEDRINE SULFATE-NACL 50-0.9 MG/10ML-% IV SOSY
PREFILLED_SYRINGE | INTRAVENOUS | Status: DC | PRN
  Administered 2021-10-13: 5 mg via INTRAVENOUS
  Administered 2021-10-13: 10 mg via INTRAVENOUS
  Administered 2021-10-13: 5 mg via INTRAVENOUS

## 2021-10-13 MED ORDER — HYDROCHLOROTHIAZIDE 25 MG PO TABS: 25.0000 mg | ORAL_TABLET | Freq: Every day | ORAL | Status: DC | PRN

## 2021-10-13 MED ORDER — SODIUM CHLORIDE 0.9 % IR SOLN
Status: DC | PRN
  Administered 2021-10-13: 1000 mL

## 2021-10-13 MED ORDER — BUPIVACAINE-EPINEPHRINE (PF) 0.25% -1:200000 IJ SOLN
INTRAMUSCULAR | Status: DC | PRN
  Administered 2021-10-13: 30 mL

## 2021-10-13 MED ORDER — EPHEDRINE 5 MG/ML INJ
INTRAVENOUS | Status: AC
  Filled 2021-10-13: qty 5

## 2021-10-13 MED ORDER — POVIDONE-IODINE 10 % EX SWAB
2.0000 | Freq: Once | CUTANEOUS | Status: AC
  Administered 2021-10-13: 2 via TOPICAL

## 2021-10-13 MED ORDER — SUGAMMADEX SODIUM 500 MG/5ML IV SOLN
INTRAVENOUS | Status: AC
  Filled 2021-10-13: qty 5

## 2021-10-13 MED ORDER — CHLORHEXIDINE GLUCONATE 0.12 % MT SOLN
15.0000 mL | Freq: Once | OROMUCOSAL | Status: AC
  Administered 2021-10-13: 15 mL via OROMUCOSAL

## 2021-10-13 MED ORDER — FENTANYL CITRATE (PF) 100 MCG/2ML IJ SOLN
INTRAMUSCULAR | Status: AC
  Filled 2021-10-13: qty 2

## 2021-10-13 MED ORDER — SUGAMMADEX SODIUM 500 MG/5ML IV SOLN
INTRAVENOUS | Status: DC | PRN
  Administered 2021-10-13: 250 mg via INTRAVENOUS

## 2021-10-13 MED ORDER — ONDANSETRON HCL 4 MG PO TABS
4.0000 mg | ORAL_TABLET | Freq: Four times a day (QID) | ORAL | Status: DC | PRN
  Filled 2021-10-13: qty 1

## 2021-10-13 MED ORDER — DEXAMETHASONE SODIUM PHOSPHATE 10 MG/ML IJ SOLN: 8.0000 mg | Freq: Once | INTRAMUSCULAR | Status: DC

## 2021-10-13 MED ORDER — METOPROLOL SUCCINATE ER 25 MG PO TB24
25.0000 mg | ORAL_TABLET | Freq: Every day | ORAL | Status: DC
  Administered 2021-10-13: 25 mg via ORAL
  Filled 2021-10-13: qty 1

## 2021-10-13 MED ORDER — LACTATED RINGERS IV SOLN: INTRAVENOUS | Status: DC

## 2021-10-13 MED ORDER — FERROUS SULFATE 325 (65 FE) MG PO TABS
325.0000 mg | ORAL_TABLET | Freq: Three times a day (TID) | ORAL | Status: DC
  Administered 2021-10-14 (×2): 325 mg via ORAL
  Filled 2021-10-13 (×2): qty 1

## 2021-10-13 MED ORDER — BUPIVACAINE-EPINEPHRINE (PF) 0.25% -1:200000 IJ SOLN
INTRAMUSCULAR | Status: AC
  Filled 2021-10-13: qty 30

## 2021-10-13 MED ORDER — VERAPAMIL HCL ER 240 MG PO TBCR
240.0000 mg | EXTENDED_RELEASE_TABLET | Freq: Every day | ORAL | Status: DC
  Administered 2021-10-13: 240 mg via ORAL
  Filled 2021-10-13: qty 1

## 2021-10-13 MED ORDER — TRANEXAMIC ACID-NACL 1000-0.7 MG/100ML-% IV SOLN
1000.0000 mg | Freq: Once | INTRAVENOUS | Status: AC
  Administered 2021-10-13: 1000 mg via INTRAVENOUS
  Filled 2021-10-13: qty 100

## 2021-10-13 MED ORDER — FENTANYL CITRATE PF 50 MCG/ML IJ SOSY
50.0000 ug | PREFILLED_SYRINGE | INTRAMUSCULAR | Status: DC
  Administered 2021-10-13: 50 ug via INTRAVENOUS
  Filled 2021-10-13: qty 2

## 2021-10-13 MED ORDER — BISACODYL 10 MG RE SUPP: 10.0000 mg | Freq: Every day | RECTAL | Status: DC | PRN

## 2021-10-13 MED ORDER — METOCLOPRAMIDE HCL 5 MG PO TABS: 5.0000 mg | ORAL_TABLET | Freq: Three times a day (TID) | ORAL | Status: DC | PRN

## 2021-10-13 MED ORDER — CELECOXIB 200 MG PO CAPS
200.0000 mg | ORAL_CAPSULE | Freq: Two times a day (BID) | ORAL | Status: DC
Start: 2021-10-13 — End: 2021-10-14
  Administered 2021-10-13 – 2021-10-14 (×3): 200 mg via ORAL
  Filled 2021-10-13 (×3): qty 1

## 2021-10-13 MED ORDER — DILTIAZEM HCL ER BEADS 180 MG PO CP24: 180.0000 mg | ORAL_CAPSULE | Freq: Every day | ORAL | Status: DC

## 2021-10-13 MED ORDER — DIPHENHYDRAMINE HCL 12.5 MG/5ML PO ELIX: 12.5000 mg | ORAL_SOLUTION | ORAL | Status: DC | PRN

## 2021-10-13 MED ORDER — ONDANSETRON HCL 4 MG/2ML IJ SOLN: 4.0000 mg | Freq: Once | INTRAMUSCULAR | Status: DC | PRN

## 2021-10-13 MED ORDER — HYDROMORPHONE HCL 1 MG/ML IJ SOLN: 0.5000 mg | INTRAMUSCULAR | Status: DC | PRN

## 2021-10-13 MED ORDER — SODIUM CHLORIDE (PF) 0.9 % IJ SOLN
INTRAMUSCULAR | Status: AC
  Filled 2021-10-13: qty 30

## 2021-10-13 MED ORDER — LIDOCAINE 2% (20 MG/ML) 5 ML SYRINGE
INTRAMUSCULAR | Status: DC | PRN
  Administered 2021-10-13: 80 mg via INTRAVENOUS

## 2021-10-13 MED ORDER — SODIUM CHLORIDE 0.9 % IV SOLN: INTRAVENOUS | Status: DC

## 2021-10-13 MED ORDER — ASPIRIN 81 MG PO CHEW
81.0000 mg | CHEWABLE_TABLET | Freq: Two times a day (BID) | ORAL | Status: DC
  Administered 2021-10-13 – 2021-10-14 (×2): 81 mg via ORAL
  Filled 2021-10-13 (×2): qty 1

## 2021-10-13 MED ORDER — INSULIN ASPART 100 UNIT/ML IJ SOLN
0.0000 [IU] | Freq: Three times a day (TID) | INTRAMUSCULAR | Status: DC
  Administered 2021-10-13: 8 [IU] via SUBCUTANEOUS
  Administered 2021-10-14: 3 [IU] via SUBCUTANEOUS
  Administered 2021-10-14: 2 [IU] via SUBCUTANEOUS

## 2021-10-13 MED ORDER — TRANEXAMIC ACID-NACL 1000-0.7 MG/100ML-% IV SOLN
1000.0000 mg | INTRAVENOUS | Status: AC
  Administered 2021-10-13: 1000 mg via INTRAVENOUS
  Filled 2021-10-13: qty 100

## 2021-10-13 MED ORDER — FENTANYL CITRATE (PF) 100 MCG/2ML IJ SOLN
INTRAMUSCULAR | Status: DC | PRN
Start: 2021-10-13 — End: 2021-10-13
  Administered 2021-10-13: 50 ug via INTRAVENOUS
  Administered 2021-10-13: 100 ug via INTRAVENOUS
  Administered 2021-10-13: 50 ug via INTRAVENOUS
  Administered 2021-10-13: 100 ug via INTRAVENOUS

## 2021-10-13 MED ORDER — SODIUM CHLORIDE (PF) 0.9 % IJ SOLN
INTRAMUSCULAR | Status: DC | PRN
  Administered 2021-10-13: 30 mL

## 2021-10-13 MED ORDER — EZETIMIBE 10 MG PO TABS
10.0000 mg | ORAL_TABLET | Freq: Every day | ORAL | Status: DC
  Administered 2021-10-14: 10 mg via ORAL
  Filled 2021-10-13: qty 1

## 2021-10-13 MED ORDER — OXYCODONE HCL 5 MG PO TABS
5.0000 mg | ORAL_TABLET | ORAL | Status: DC | PRN
  Administered 2021-10-13 – 2021-10-14 (×5): 5 mg via ORAL
  Filled 2021-10-13 (×5): qty 1

## 2021-10-13 MED ORDER — KETOROLAC TROMETHAMINE 30 MG/ML IJ SOLN
INTRAMUSCULAR | Status: DC | PRN
  Administered 2021-10-13: 30 mg

## 2021-10-13 MED ORDER — MENTHOL 3 MG MT LOZG: 1.0000 | LOZENGE | OROMUCOSAL | Status: DC | PRN

## 2021-10-13 MED ORDER — FLUTICASONE PROPIONATE 50 MCG/ACT NA SUSP: 1.0000 | NASAL | Status: DC | PRN

## 2021-10-13 MED ORDER — METOCLOPRAMIDE HCL 5 MG/ML IJ SOLN: 5.0000 mg | Freq: Three times a day (TID) | INTRAMUSCULAR | Status: DC | PRN

## 2021-10-13 MED ORDER — HYDRALAZINE HCL 20 MG/ML IJ SOLN
10.0000 mg | Freq: Four times a day (QID) | INTRAMUSCULAR | Status: DC | PRN
Start: 2021-10-13 — End: 2021-10-14

## 2021-10-13 MED ORDER — KETOROLAC TROMETHAMINE 30 MG/ML IJ SOLN
INTRAMUSCULAR | Status: AC
  Filled 2021-10-13: qty 1

## 2021-10-13 MED ORDER — LOSARTAN POTASSIUM 50 MG PO TABS
50.0000 mg | ORAL_TABLET | Freq: Every day | ORAL | Status: DC
  Administered 2021-10-14: 50 mg via ORAL
  Filled 2021-10-13: qty 1

## 2021-10-13 MED ORDER — ACETAMINOPHEN 500 MG PO TABS
1000.0000 mg | ORAL_TABLET | Freq: Once | ORAL | Status: AC
  Administered 2021-10-13: 1000 mg via ORAL
  Filled 2021-10-13: qty 2

## 2021-10-13 MED ORDER — ONDANSETRON HCL 4 MG/2ML IJ SOLN
INTRAMUSCULAR | Status: DC | PRN
  Administered 2021-10-13: 4 mg via INTRAVENOUS

## 2021-10-13 MED ORDER — DOCUSATE SODIUM 100 MG PO CAPS
100.0000 mg | ORAL_CAPSULE | Freq: Two times a day (BID) | ORAL | Status: DC
Start: 2021-10-13 — End: 2021-10-14
  Administered 2021-10-13 – 2021-10-14 (×2): 100 mg via ORAL
  Filled 2021-10-13 (×2): qty 1

## 2021-10-13 MED ORDER — ESMOLOL HCL 100 MG/10ML IV SOLN
INTRAVENOUS | Status: AC
  Filled 2021-10-13: qty 10

## 2021-10-13 MED ORDER — CEFAZOLIN SODIUM-DEXTROSE 2-4 GM/100ML-% IV SOLN
2.0000 g | INTRAVENOUS | Status: AC
  Administered 2021-10-13: 2 g via INTRAVENOUS
  Filled 2021-10-13: qty 100

## 2021-10-13 MED ORDER — DEXAMETHASONE SODIUM PHOSPHATE 10 MG/ML IJ SOLN
10.0000 mg | Freq: Once | INTRAMUSCULAR | Status: AC
  Administered 2021-10-14: 10 mg via INTRAVENOUS
  Filled 2021-10-13: qty 1

## 2021-10-13 MED ORDER — ESMOLOL HCL 100 MG/10ML IV SOLN
INTRAVENOUS | Status: DC | PRN
  Administered 2021-10-13: 20 mg via INTRAVENOUS
  Administered 2021-10-13 (×4): 10 mg via INTRAVENOUS

## 2021-10-13 MED ORDER — ORAL CARE MOUTH RINSE: 15.0000 mL | Freq: Once | OROMUCOSAL | Status: AC

## 2021-10-13 MED ORDER — PHENOL 1.4 % MT LIQD: 1.0000 | OROMUCOSAL | Status: DC | PRN

## 2021-10-13 MED ORDER — ONDANSETRON HCL 4 MG/2ML IJ SOLN: 4.0000 mg | Freq: Four times a day (QID) | INTRAMUSCULAR | Status: DC | PRN

## 2021-10-13 MED ORDER — DEXAMETHASONE SODIUM PHOSPHATE 10 MG/ML IJ SOLN
INTRAMUSCULAR | Status: DC | PRN
  Administered 2021-10-13: 4 mg via INTRAVENOUS

## 2021-10-13 MED ORDER — METHOCARBAMOL 500 MG IVPB - SIMPLE MED: 500.0000 mg | Freq: Four times a day (QID) | INTRAVENOUS | Status: DC | PRN

## 2021-10-13 MED ORDER — METHOCARBAMOL 500 MG PO TABS
500.0000 mg | ORAL_TABLET | Freq: Four times a day (QID) | ORAL | Status: DC | PRN
  Administered 2021-10-13: 500 mg via ORAL
  Filled 2021-10-13: qty 1

## 2021-10-13 MED ORDER — OXYCODONE HCL 5 MG PO TABS: 10.0000 mg | ORAL_TABLET | ORAL | Status: DC | PRN

## 2021-10-13 MED ORDER — ROPIVACAINE HCL 5 MG/ML IJ SOLN
INTRAMUSCULAR | Status: DC | PRN
  Administered 2021-10-13: 30 mL via PERINEURAL

## 2021-10-13 MED ORDER — ACETAMINOPHEN 325 MG PO TABS
325.0000 mg | ORAL_TABLET | Freq: Four times a day (QID) | ORAL | Status: DC | PRN
  Administered 2021-10-14: 650 mg via ORAL
  Filled 2021-10-13: qty 2

## 2021-10-13 MED ORDER — METFORMIN HCL 500 MG PO TABS
1000.0000 mg | ORAL_TABLET | Freq: Two times a day (BID) | ORAL | Status: DC
  Administered 2021-10-14: 1000 mg via ORAL
  Filled 2021-10-13: qty 2

## 2021-10-13 MED ORDER — ROCURONIUM BROMIDE 10 MG/ML (PF) SYRINGE
PREFILLED_SYRINGE | INTRAVENOUS | Status: DC | PRN
  Administered 2021-10-13: 60 mg via INTRAVENOUS

## 2021-10-13 MED ORDER — PROPOFOL 10 MG/ML IV BOLUS
INTRAVENOUS | Status: DC | PRN
  Administered 2021-10-13: 150 mg via INTRAVENOUS

## 2021-10-13 MED ORDER — MIDAZOLAM HCL 2 MG/2ML IJ SOLN
1.0000 mg | INTRAMUSCULAR | Status: DC
  Filled 2021-10-13: qty 2

## 2021-10-13 MED ORDER — POLYETHYLENE GLYCOL 3350 17 G PO PACK: 17.0000 g | PACK | Freq: Every day | ORAL | Status: DC | PRN

## 2021-10-13 MED ORDER — ZOLPIDEM TARTRATE 5 MG PO TABS
5.0000 mg | ORAL_TABLET | Freq: Every evening | ORAL | Status: DC | PRN
  Administered 2021-10-13: 5 mg via ORAL
  Filled 2021-10-13: qty 1

## 2021-10-13 MED ORDER — PANTOPRAZOLE SODIUM 40 MG PO TBEC
40.0000 mg | DELAYED_RELEASE_TABLET | Freq: Every day | ORAL | Status: DC
  Administered 2021-10-14: 40 mg via ORAL
  Filled 2021-10-13: qty 1

## 2021-10-13 MED ORDER — PROPOFOL 10 MG/ML IV BOLUS
INTRAVENOUS | Status: AC
  Filled 2021-10-13: qty 20

## 2021-10-13 MED ORDER — CEFAZOLIN SODIUM-DEXTROSE 2-4 GM/100ML-% IV SOLN
2.0000 g | Freq: Four times a day (QID) | INTRAVENOUS | Status: AC
  Administered 2021-10-13 (×2): 2 g via INTRAVENOUS
  Filled 2021-10-13 (×2): qty 100

## 2021-10-13 SURGICAL SUPPLY — 58 items
ATTUNE MED ANAT PAT 38 KNEE (Knees) ×1 IMPLANT
ATTUNE PS FEM RT SZ 5 CEM KNEE (Femur) ×1 IMPLANT
ATTUNE PSRP INSR SZ5 6 KNEE (Insert) ×1 IMPLANT
BAG COUNTER SPONGE SURGICOUNT (BAG) ×1 IMPLANT
BAG ZIPLOCK 12X15 (MISCELLANEOUS) ×1 IMPLANT
BASE TIBIAL ROT PLAT SZ 5 KNEE (Knees) IMPLANT
BLADE SAW SGTL 11.0X1.19X90.0M (BLADE) IMPLANT
BLADE SAW SGTL 13.0X1.19X90.0M (BLADE) ×2 IMPLANT
BNDG ELASTIC 6X5.8 VLCR STR LF (GAUZE/BANDAGES/DRESSINGS) ×2 IMPLANT
BOWL SMART MIX CTS (DISPOSABLE) ×2 IMPLANT
CEMENT HV SMART SET (Cement) ×2 IMPLANT
CUFF TOURN SGL QUICK 34 (TOURNIQUET CUFF) ×2
CUFF TRNQT CYL 34X4.125X (TOURNIQUET CUFF) ×1 IMPLANT
DERMABOND ADVANCED (GAUZE/BANDAGES/DRESSINGS) ×1
DERMABOND ADVANCED .7 DNX12 (GAUZE/BANDAGES/DRESSINGS) ×1 IMPLANT
DRAPE SHEET LG 3/4 BI-LAMINATE (DRAPES) ×2 IMPLANT
DRAPE U-SHAPE 47X51 STRL (DRAPES) ×2 IMPLANT
DRESSING AQUACEL AG SP 3.5X10 (GAUZE/BANDAGES/DRESSINGS) ×1 IMPLANT
DRSG AQUACEL AG SP 3.5X10 (GAUZE/BANDAGES/DRESSINGS) ×2
DURAPREP 26ML APPLICATOR (WOUND CARE) ×4 IMPLANT
ELECT REM PT RETURN 15FT ADLT (MISCELLANEOUS) ×2 IMPLANT
FACESHIELD WRAPAROUND (MASK) ×2 IMPLANT
FACESHIELD WRAPAROUND OR TEAM (MASK) ×1 IMPLANT
GLOVE BIO SURGEON STRL SZ 6 (GLOVE) ×2 IMPLANT
GLOVE BIOGEL PI IND STRL 6.5 (GLOVE) ×1 IMPLANT
GLOVE BIOGEL PI IND STRL 7.5 (GLOVE) ×1 IMPLANT
GLOVE BIOGEL PI INDICATOR 6.5 (GLOVE) ×1
GLOVE BIOGEL PI INDICATOR 7.5 (GLOVE) ×1
GLOVE ORTHO TXT STRL SZ7.5 (GLOVE) ×4 IMPLANT
GOWN STRL REUS W/ TWL LRG LVL3 (GOWN DISPOSABLE) ×3 IMPLANT
GOWN STRL REUS W/TWL LRG LVL3 (GOWN DISPOSABLE) ×6
HANDPIECE INTERPULSE COAX TIP (DISPOSABLE) ×2
HOLDER FOLEY CATH W/STRAP (MISCELLANEOUS) IMPLANT
KIT TURNOVER KIT A (KITS) IMPLANT
MANIFOLD NEPTUNE II (INSTRUMENTS) ×2 IMPLANT
NDL SAFETY ECLIPSE 18X1.5 (NEEDLE) IMPLANT
NEEDLE HYPO 18GX1.5 SHARP (NEEDLE)
NS IRRIG 1000ML POUR BTL (IV SOLUTION) ×2 IMPLANT
PACK TOTAL KNEE CUSTOM (KITS) ×2 IMPLANT
PENCIL HANDSWITCHING (ELECTRODE) ×1 IMPLANT
PROTECTOR NERVE ULNAR (MISCELLANEOUS) ×2 IMPLANT
SET HNDPC FAN SPRY TIP SCT (DISPOSABLE) ×1 IMPLANT
SET PAD KNEE POSITIONER (MISCELLANEOUS) ×2 IMPLANT
SPIKE FLUID TRANSFER (MISCELLANEOUS) ×2 IMPLANT
SUT MNCRL AB 4-0 PS2 18 (SUTURE) ×2 IMPLANT
SUT STRATAFIX PDS+ 0 24IN (SUTURE) ×2 IMPLANT
SUT VIC AB 1 CT1 36 (SUTURE) ×2 IMPLANT
SUT VIC AB 2-0 CT1 27 (SUTURE) ×4
SUT VIC AB 2-0 CT1 TAPERPNT 27 (SUTURE) ×2 IMPLANT
SYR 3ML LL SCALE MARK (SYRINGE) ×2 IMPLANT
TIBIAL BASE ROT PLAT SZ 5 KNEE (Knees) ×2 IMPLANT
TOWEL GREEN STERILE FF (TOWEL DISPOSABLE) ×2 IMPLANT
TRAY CATH INTERMITTENT SS 16FR (CATHETERS) ×1 IMPLANT
TRAY FOLEY MTR SLVR 14FR STAT (SET/KITS/TRAYS/PACK) IMPLANT
TRAY FOLEY MTR SLVR 16FR STAT (SET/KITS/TRAYS/PACK) ×1 IMPLANT
TUBE SUCTION HIGH CAP CLEAR NV (SUCTIONS) ×2 IMPLANT
WATER STERILE IRR 1000ML POUR (IV SOLUTION) ×4 IMPLANT
WRAP KNEE MAXI GEL POST OP (GAUZE/BANDAGES/DRESSINGS) ×2 IMPLANT

## 2021-10-13 NOTE — Progress Notes (Signed)
Pt had tick on right lower leg. Tick was removed and area cleaned with chg. No reddness noted at tick site.

## 2021-10-13 NOTE — Interval H&P Note (Signed)
History and Physical Interval Note:  10/13/2021 8:55 AM  [name]  has presented today for surgery, with the diagnosis of Right knee osteoarthritis.  The various methods of treatment have been discussed with the patient and family. After consideration of risks, benefits and other options for treatment, the patient has consented to  Procedure(s): TOTAL KNEE ARTHROPLASTY (Right) as a surgical intervention.  The patient's history has been reviewed, patient examined, no change in status, stable for surgery.  I have reviewed the patient's chart and labs.  Questions were answered to the patient's satisfaction.     Mauri Pole

## 2021-10-13 NOTE — Anesthesia Postprocedure Evaluation (Signed)
Anesthesia Post Note  Patient: [name]  Procedure(s) Performed: TOTAL KNEE ARTHROPLASTY (Right: Knee)     Patient location during evaluation: PACU Anesthesia Type: General Level of consciousness: awake and alert Pain management: pain level controlled Vital Signs Assessment: post-procedure vital signs reviewed and stable Respiratory status: spontaneous breathing, nonlabored ventilation, respiratory function stable and patient connected to nasal cannula oxygen Cardiovascular status: blood pressure returned to baseline and stable Postop Assessment: no apparent nausea or vomiting Anesthetic complications: no   No notable events documented.  Last Vitals:  Vitals:   10/13/21 1621 10/13/21 1626  BP: (!) 153/71 (!) 166/84  Pulse:  75  Resp:  18  Temp:    SpO2:  94%    Last Pain:  Vitals:   10/13/21 1345  TempSrc:   PainSc: 0-No pain                 Santa Lighter

## 2021-10-13 NOTE — Care Plan (Signed)
Ortho Bundle Case Management Note  Patient Details  Name: [name] MRN: [id number] Date of Birth: Feb 07, 1951  R TKA on 10-13-21 DCP:  Home with husband DME:  No needs, has a RW PT:  [facility]  on 10-16-21                   DME Arranged:  N/A DME Agency:  NA  HH Arranged:  NA [place] Agency:  NA  Additional Comments: Please contact me with any questions of if this plan should need to change.  Marianne Sofia, RN,CCM [facility]  [phone] 10/13/2021, 8:35 AM

## 2021-10-13 NOTE — Anesthesia Procedure Notes (Addendum)
Anesthesia Regional Block: Adductor canal block   Pre-Anesthetic Checklist: , timeout performed,  Correct Patient, Correct Site, Correct Laterality,  Correct Procedure, Correct Position, site marked,  Risks and benefits discussed,  Surgical consent,  Pre-op evaluation,  At surgeon's request and post-op pain management  Laterality: Right  Prep: chloraprep       Needles:  Injection technique: Single-shot  Needle Type: Echogenic Needle     Needle Length: 9cm  Needle Gauge: 21     Additional Needles:   Procedures:,,,, ultrasound used (permanent image in chart),,    Narrative:  Start time: 10/13/2021 9:21 AM End time: 10/13/2021 9:27 AM Injection made incrementally with aspirations every 5 mL.  Performed by: Personally  Anesthesiologist: Santa Lighter, MD  Additional Notes: No pain on injection. No increased resistance to injection. Injection made in 5cc increments.  Good needle visualization.  Patient tolerated procedure well.

## 2021-10-13 NOTE — Progress Notes (Signed)
Patient admitted to [id number] in NAD. VSS. AOx4. Right knee immobilizer, ice packs, Ted hose, and SCDs in place. Patient oriented to unit and call bell. All needs within reach. Falls precautions in place.

## 2021-10-13 NOTE — Evaluation (Signed)
Physical Therapy Evaluation Patient Details Name: [name] MRN: [id number] DOB: 06/04/1950 Today's Date: 10/13/2021  History of Present Illness  Pt is a 71yo female presenting s/p R-TKA on 10/13/21. PMH: OA, DM, GERD, HTN. R-RCR  Clinical Impression  Pearla Mckinny Rickenbach is a 70 y.o. female POD 0 s/p R-TKA. Patient reports modified independence using SPC for mobility at baseline. Patient is now limited by functional impairments (see PT problem list below) and requires min guard for transfers and gait with RW. Patient was able to ambulate 20 feet with RW and min guard. Patient instructed in exercise to facilitate ROM and circulation to manage edema. Patient will benefit from continued skilled PT interventions to address impairments and progress towards PLOF. Acute PT will follow to progress mobility and stair training in preparation for safe discharge home.       Recommendations for follow up therapy are one component of a multi-disciplinary discharge planning process, led by the attending physician.  Recommendations may be updated based on patient status, additional functional criteria and insurance authorization.  Follow Up Recommendations Follow physician's recommendations for discharge plan and follow up therapies      Assistance Recommended at Discharge Intermittent Supervision/Assistance  Patient can return home with the following  A little help with walking and/or transfers;A little help with bathing/dressing/bathroom;Assistance with cooking/housework;Assist for transportation;Help with stairs or ramp for entrance    Equipment Recommendations None recommended by PT (Pt has recommended DME)  Recommendations for Other Services       Functional Status Assessment Patient has had a recent decline in their functional status and demonstrates the ability to make significant improvements in function in a reasonable and predictable amount of time.     Precautions / Restrictions  Precautions Precautions: None Restrictions Weight Bearing Restrictions: No Other Position/Activity Restrictions: wbat      Mobility  Bed Mobility Overal bed mobility: Needs Assistance Bed Mobility: Supine to Sit     Supine to sit: Min assist     General bed mobility comments: Min assist to elevate trunk, scoot hips EOB.    Transfers Overall transfer level: Needs assistance Equipment used: Rolling walker (2 wheels) Transfers: Sit to/from Stand Sit to Stand: Min guard, From elevated surface           General transfer comment: Pt required min guard to complete sit to stand transfer from elevated surface, VCs for sequencing, no physical assist required. Pt reporting feeling "wobbly" upon standing that decreased with ambulation.    Ambulation/Gait Ambulation/Gait assistance: Min guard, +2 safety/equipment Gait Distance (Feet): 20 Feet Assistive device: Rolling walker (2 wheels) Gait Pattern/deviations: Step-to pattern Gait velocity: decreased     General Gait Details: Pt ambulated with RW and min guard, no physical assist required or overt LOB noted, +2 for recliner follow for safety  Stairs            Wheelchair Mobility    Modified Rankin (Stroke Patients Only)       Balance Overall balance assessment: Needs assistance Sitting-balance support: Feet supported, No upper extremity supported Sitting balance-Leahy Scale: Good     Standing balance support: Reliant on assistive device for balance, During functional activity, Bilateral upper extremity supported Standing balance-Leahy Scale: Poor                               Pertinent Vitals/Pain Pain Assessment Pain Assessment: 0-10 Pain Score: 3  Pain Location: right knee Pain Descriptors /  Indicators: Operative site guarding Pain Intervention(s): Limited activity within patient's tolerance, Monitored during session, Repositioned, Ice applied    Home Living Family/patient expects to be  discharged to:: Private residence Living Arrangements: Spouse/significant other Available Help at Discharge: Family;Available 24 hours/day Type of Home: House Home Access: Stairs to enter Entrance Stairs-Rails: Left;Right;Can reach both Entrance Stairs-Number of Steps: 5   Home Layout: One level Home Equipment: Conservation officer, nature (2 wheels);Cane - single point      Prior Function Prior Level of Function : Independent/Modified Independent             Mobility Comments: SPC ADLs Comments: ind     Hand Dominance        Extremity/Trunk Assessment   Upper Extremity Assessment Upper Extremity Assessment: Overall WFL for tasks assessed    Lower Extremity Assessment Lower Extremity Assessment: RLE deficits/detail;LLE deficits/detail RLE Deficits / Details: MMT ank DF/PF 5/5, no extensor lag RLE Sensation: WNL LLE Deficits / Details: MMT ank DF/PF 5/5 LLE Sensation: WNL    Cervical / Trunk Assessment Cervical / Trunk Assessment: Kyphotic  Communication   Communication: No difficulties  Cognition Arousal/Alertness: Awake/alert Behavior During Therapy: WFL for tasks assessed/performed Overall Cognitive Status: Within Functional Limits for tasks assessed                                          General Comments      Exercises Total Joint Exercises Ankle Circles/Pumps: AROM, Both, 20 reps, Seated Other Exercises Other Exercises: Incentive spirometry x3, VCs for slow and controlled, pt reached 1729m   Assessment/Plan    PT Assessment Patient needs continued PT services  PT Problem List Decreased strength;Decreased range of motion;Decreased activity tolerance;Decreased balance;Decreased mobility;Decreased coordination;Decreased knowledge of use of DME;Pain       PT Treatment Interventions DME instruction;Gait training;Stair training;Functional mobility training;Therapeutic activities;Therapeutic exercise;Balance training;Neuromuscular  re-education;Patient/family education    PT Goals (Current goals can be found in the Care Plan section)  Acute Rehab PT Goals Patient Stated Goal: walk dogs, work in garden PT Goal Formulation: With patient Time For Goal Achievement: 10/20/21 Potential to Achieve Goals: Good    Frequency 7X/week     Co-evaluation               AM-PAC PT "6 Clicks" Mobility  Outcome Measure Help needed turning from your back to your side while in a flat bed without using bedrails?: None Help needed moving from lying on your back to sitting on the side of a flat bed without using bedrails?: A Little Help needed moving to and from a bed to a chair (including a wheelchair)?: A Little Help needed standing up from a chair using your arms (e.g., wheelchair or bedside chair)?: A Little Help needed to walk in hospital room?: A Little Help needed climbing 3-5 steps with a railing? : A Lot 6 Click Score: 18    End of Session Equipment Utilized During Treatment: Gait belt Activity Tolerance: Patient tolerated treatment well;No increased pain Patient left: in chair;with call bell/phone within reach;with chair alarm set;with SCD's reapplied Nurse Communication: Mobility status PT Visit Diagnosis: Difficulty in walking, not elsewhere classified (R26.2);Pain Pain - Right/Left: Right Pain - part of body: Knee    Time: 11017-5102PT Time Calculation (min) (ACUTE ONLY): 20 min   Charges:   PT Evaluation $PT Eval Low Complexity: 1 Low  Coolidge Breeze, PT, DPT [facility] Department Office: [phone] Pager: [phone]  Coolidge Breeze 10/13/2021, 5:27 PM

## 2021-10-13 NOTE — H&P (Signed)
TOTAL KNEE ADMISSION H&P  Patient is being admitted for right total knee arthroplasty.  Subjective:  Chief Complaint:right knee pain.  HPI: [name], 71 y.o. female, has a history of pain and functional disability in the right knee due to arthritis and has failed non-surgical conservative treatments for greater than 12 weeks to includeNSAID's and/or analgesics and activity modification.  Onset of symptoms was gradual, starting 2 years ago with gradually worsening course since that time. The patient noted no past surgery on the right knee(s).  Patient currently rates pain in the right knee(s) at 8 out of 10 with activity. Patient has worsening of pain with activity and weight bearing, pain that interferes with activities of daily living, and pain with passive range of motion.  Patient has evidence of joint space narrowing by imaging studies. There is no active infection.  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   Other hemochromatosis 01/30/2021   Severe aortic stenosis 07/28/2020   Moderate aortic stenosis 07/28/2020   BMI 34.0-34.9,adult 06/06/2019   Incomplete uterovaginal prolapse 11/10/2017   Essential hypertension 02/16/2016   Sciatic leg pain 02/16/2016   Past Medical History:  Diagnosis Date   Arthritis    Cervical incompetence    Diabetes mellitus without complication (HCC)    Family history of adverse reaction to anesthesia    Mother with nausea   GERD (gastroesophageal reflux disease)    Hay fever    Heart murmur    Hemochromatosis    Hypertension    Migraines    Sciatica     Past Surgical History:  Procedure Laterality Date   CARPAL TUNNEL RELEASE Right    COLONOSCOPY N/A 08/01/2014   Procedure: COLONOSCOPY;  Surgeon: Rogene Houston, MD;  Location: [facility];  Service: Endoscopy;  Laterality: N/A;  900 -- moved to 10:00 - Ann notified pt   ELBOW SURGERY     RIGHT/LEFT HEART CATH AND CORONARY  ANGIOGRAPHY N/A 07/28/2020   Procedure: RIGHT/LEFT HEART CATH AND CORONARY ANGIOGRAPHY;  Surgeon: Sherren Mocha, MD;  Location: [facility];  Service: Cardiovascular;  Laterality: N/A;   ROTATOR CUFF REPAIR Right 2012   TONSILLECTOMY     TUBAL LIGATION     WISDOM TOOTH EXTRACTION      No current facility-administered medications for this encounter.   Current Outpatient Medications  Medication Sig Dispense Refill Last Dose   diltiazem (TIAZAC) 180 MG 24 hr capsule Take 180 mg by mouth at bedtime.      ezetimibe (ZETIA) 10 MG tablet Take 10 mg by mouth daily.      fluticasone (FLONASE) 50 MCG/ACT nasal spray Place 1-2 sprays into both nostrils as needed for allergies or rhinitis.       hydrochlorothiazide (HYDRODIURIL) 25 MG tablet Take 1 tablet (25 mg total) by mouth daily as needed (Edema). 30 tablet 3    hydrocortisone cream 1 % Apply 1 application. topically daily as needed for itching.      losartan (COZAAR) 50 MG tablet Take 50 mg by mouth daily.      metFORMIN (GLUCOPHAGE) 1000 MG tablet Take 1,000 mg by mouth 2 (two) times daily.      metoprolol succinate (TOPROL-XL) 50 MG 24 hr tablet Take 25 mg by mouth at bedtime.      omeprazole (PRILOSEC) 20 MG capsule Take 20 mg by mouth daily.   3    verapamil (CALAN-SR) 240 MG CR tablet Take 1 tablet (  240 mg total) by mouth at bedtime. 90 tablet 3    zolpidem (AMBIEN) 10 MG tablet Take 5 mg by mouth at bedtime as needed for sleep.  2    cephALEXin (KEFLEX) 500 MG capsule Take 1 capsule (500 mg total) by mouth 2 (two) times daily for 7 days. 14 capsule 0    No Known Allergies  Social History   Tobacco Use   Smoking status: Former    Packs/day: 0.10    Types: Cigarettes    Start date: 09/16/1962    Quit date: 09/15/1969    Years since quitting: 52.1   Smokeless tobacco: Never   Tobacco comments:    as a teenager  Substance Use Topics   Alcohol use: Not Currently    Family History  Problem Relation Age of Onset   Heart  attack Paternal Grandfather    Migraines Maternal Grandmother    Heart attack Maternal Grandfather    Heart attack Father    Heart murmur Father    Cancer Mother        pancreatic cancer   Heart murmur Brother    Breast cancer Maternal Aunt      Review of Systems  Constitutional:  Negative for chills and fever.  Respiratory:  Negative for cough and shortness of breath.   Cardiovascular:  Negative for chest pain.  Gastrointestinal:  Negative for nausea and vomiting.  Musculoskeletal:  Positive for arthralgias.     Objective:  Physical Exam Well nourished and well developed. General: Alert and oriented x3, cooperative and pleasant, no acute distress. Head: normocephalic, atraumatic, neck supple. Eyes: EOMI.  Musculoskeletal: Right knee exam: No palpable effusion, warmth erythema Genu varum associated with slight flexion contracture with flexion close to 120 degrees Tenderness over the medial and anterior aspect knee Stable medial lateral collateral ligaments  Calves soft and nontender. Motor function intact in LE. Strength 5/5 LE bilaterally. Neuro: Distal pulses 2+. Sensation to light touch intact in LE.  Vital signs in last 24 hours:    Labs:   Estimated body mass index is 34.88 kg/m as calculated from the following:   Height as of 10/01/21: '5\' 8"'$  (1.727 m).   Weight as of 10/01/21: 104.1 kg.   Imaging Review Plain radiographs demonstrate severe degenerative joint disease of the right knee(s). The overall alignment isneutral. The bone quality appears to be adequate for age and reported activity level.      Assessment/Plan:  End stage arthritis, right knee   The patient history, physical examination, clinical judgment of the provider and imaging studies are consistent with end stage degenerative joint disease of the right knee(s) and total knee arthroplasty is deemed medically necessary. The treatment options including medical management, injection therapy  arthroscopy and arthroplasty were discussed at length. The risks and benefits of total knee arthroplasty were presented and reviewed. The risks due to aseptic loosening, infection, stiffness, patella tracking problems, thromboembolic complications and other imponderables were discussed. The patient acknowledged the explanation, agreed to proceed with the plan and consent was signed. Patient is being admitted for inpatient treatment for surgery, pain control, PT, OT, prophylactic antibiotics, VTE prophylaxis, progressive ambulation and ADL's and discharge planning. The patient is planning to be discharged  home.  Therapy Plans: outpatient therapy at [facility] Disposition: Home with husband Planned DVT Prophylaxis: aspirin '81mg'$  BID DME needed: none PCP: Dr. [name], clearance received Cardiologist: Dr. Johnsie Cancel, clearance received TXA: IV Allergies: statins - muscle cramps Anesthesia Concerns: none BMI: 36.8 Last HgbA1c: 6.8%  Other: - oxycodone, robaxin, tylenol, celebrex - Has some bug bites around knee/leg - may use cefadroxil if red    Patient's anticipated LOS is less than 2 midnights, meeting these requirements: - Younger than 30 - Lives within 1 hour of care - Has a competent adult at home to recover with post-op recover - NO history of  - Chronic pain requiring opiods  - Diabetes  - Coronary Artery Disease  - Heart failure  - Heart attack  - Stroke  - DVT/VTE  - Cardiac arrhythmia  - Respiratory Failure/COPD  - Renal failure  - Anemia  - Advanced Liver disease  Costella Hatcher, PA-C Orthopedic Surgery [facility] [place] [phone]

## 2021-10-13 NOTE — Progress Notes (Signed)
AssistedDr. Gifford Shave with right, adductor canal block. Side rails up, monitors on throughout procedure. See vital signs in flow sheet. Tolerated Procedure well.

## 2021-10-13 NOTE — Op Note (Signed)
NAME:  [name]                      MEDICAL RECORD NO.:  [id number]                             FACILITY:  [facility]      PHYSICIAN:  Pietro Cassis. [name], M.D.  DATE OF BIRTH:  [birth year]      DATE OF PROCEDURE:  10/13/2021                                     OPERATIVE REPORT         PREOPERATIVE DIAGNOSIS:  Right knee osteoarthritis.      POSTOPERATIVE DIAGNOSIS:  Right knee osteoarthritis.      FINDINGS:  The patient was noted to have complete loss of cartilage and   bone-on-bone arthritis with associated osteophytes in the medial and patellofemoral compartments of   the knee with a significant synovitis and associated effusion.  The patient had failed months of conservative treatment including medications, injection therapy, activity modification.     PROCEDURE:  Right total knee replacement.      COMPONENTS USED:  DePuy Attune rotating platform posterior stabilized knee   system, a size 5N femur, 5 tibia, size 6 mm PS AOX insert, and 38 anatomic patellar   button.      SURGEON:  Pietro Cassis. [name], M.D.      ASSISTANT:  Costella Hatcher, PA-C.      ANESTHESIA:  General and Regional.      SPECIMENS:  None.      COMPLICATION:  None.      DRAINS:  None.  EBL: <300 cc     TOURNIQUET TIME:   Total Tourniquet Time Documented: Thigh (Right) - 15 minutes Thigh (Right) - 15 minutes Total: Thigh (Right) - 30 minutes  .      The patient was stable to the recovery room.      INDICATION FOR PROCEDURE:  [name] is a 71 y.o. female patient of   mine.  The patient had been seen, evaluated, and treated for months conservatively in the   office with medication, activity modification, and injections.  The patient had   radiographic changes of bone-on-bone arthritis with endplate sclerosis and osteophytes noted.  Based on the radiographic changes and failed conservative measures, the patient   decided to proceed with definitive treatment, total knee replacement.  Risks of  infection, DVT, component failure, need for revision surgery, neurovascular injury were reviewed in the office setting.  The postop course was reviewed stressing the efforts to maximize post-operative satisfaction and function.  Consent was obtained for benefit of pain   relief.      PROCEDURE IN DETAIL:  The patient was brought to the operative theater.   Once adequate anesthesia, preoperative antibiotics, 2 gm of Ancef,1 gm of Tranexamic Acid, and 10 mg of Decadron administered, the patient was positioned supine with a right thigh tourniquet placed.  The  right lower extremity was prepped and draped in sterile fashion.  A time-   out was performed identifying the patient, planned procedure, and the appropriate extremity.      The right lower extremity was placed in the [facility] leg holder.  The leg was   exsanguinated, tourniquet elevated to 250 mmHg.  A midline  incision was   made followed by median parapatellar arthrotomy.  Following initial   exposure, attention was first directed to the patella.  Precut   measurement was noted to be 24 mm.  I resected down to 14 mm and used a   38 anatomic patellar button to restore patellar height as well as cover the cut surface.      The lug holes were drilled and a metal shim was placed to protect the   patella from retractors and saw blade during the procedure.      At this point, attention was now directed to the femur.  The femoral   canal was opened with a drill, irrigated to try to prevent fat emboli.  An   intramedullary rod was passed at 3 degrees valgus, 9 mm of bone was   resected off the distal femur.  Following this resection, the tibia was   subluxated anteriorly.  Using the extramedullary guide, 2 mm of bone was resected off   the proximal medial tibia.  We confirmed the gap would be   stable medially and laterally with a size 5 spacer block as well as confirmed that the tibial cut was perpendicular in the coronal plane, checking with an  alignment rod.      Once this was done, I sized the femur to be a size 5 in the anterior-   posterior dimension, chose a narrow component based on medial and   lateral dimension.  The size 5 rotation block was then pinned in   position anterior referenced using the C-clamp to set rotation.  The   anterior, posterior, and  chamfer cuts were made without difficulty nor   notching making certain that I was along the anterior cortex to help   with flexion gap stability.      The final box cut was made off the lateral aspect of distal femur.      At this point, the tibia was sized to be a size 5.  The size 5 tray was   then pinned in position through the medial third of the tubercle,   drilled, and keel punched.  Trial reduction was now carried with a 5 femur,  5 tibia, a size 6 mm PS insert, and the 38 anatomic patella botton.  The knee was brought to full extension with good flexion stability with the patella   tracking through the trochlea without application of pressure.  Given   all these findings the trial components removed.  Final components were   opened and cement was mixed.  The knee was irrigated with normal saline solution and pulse lavage.  The synovial lining was   then injected with 30 cc of 0.25% Marcaine with epinephrine, 1 cc of Toradol and 30 cc of NS for a total of 61 cc.     Final implants were then cemented onto cleaned and dried cut surfaces of bone with the knee brought to extension with a size 6 mm PS trial insert.      Once the cement had fully cured, excess cement was removed   throughout the knee.  I confirmed that I was satisfied with the range of   motion and stability, and the final size 6 mm PS AOX insert was chosen.  It was   placed into the knee.      The tourniquet had been let down at 30 minutes.  No significant   hemostasis was required.  The extensor mechanism was then  reapproximated using #1 Vicryl and #1 Stratafix sutures with the knee   in flexion.   The   remaining wound was closed with 2-0 Vicryl and running 4-0 Monocryl.   The knee was cleaned, dried, dressed sterilely using Dermabond and   Aquacel dressing.  The patient was then   brought to recovery room in stable condition, tolerating the procedure   well.   Please note that Physician Assistant, Costella Hatcher, PA-C was present for the entirety of the case, and was utilized for pre-operative positioning, peri-operative retractor management, general facilitation of the procedure and for primary wound closure at the end of the case.              Pietro Cassis [name], M.D.    10/13/2021 11:45 AM

## 2021-10-13 NOTE — Telephone Encounter (Signed)
Pt c/o medication issue:  1. Name of Medication: verapamil (CALAN-SR) 240 MG CR tablet diltiazem (TIAZAC) 180 MG 24 hr capsule  2. How are you currently taking this medication (dosage and times per day)? N/A  3. Are you having a reaction (difficulty breathing--STAT)? N/A  4. What is your medication issue? [name] is calling wanting to confirm if the patient is supposed to be on both of these medications. He reports the patient has still been refilling both per the pharmacy.

## 2021-10-13 NOTE — Transfer of Care (Signed)
Immediate Anesthesia Transfer of Care Note  Patient: [name]  Procedure(s) Performed: Procedure(s): TOTAL KNEE ARTHROPLASTY (Right)  Patient Location: PACU  Anesthesia Type:General  Level of Consciousness:  sedated, patient cooperative and responds to stimulation  Airway & Oxygen Therapy:Patient Spontanous Breathing and Patient connected to face mask oxgen  Post-op Assessment:  Report given to PACU RN and Post -op Vital signs reviewed and stable  Post vital signs:  Reviewed and stable  Last Vitals:  Vitals:   10/13/21 0951 10/13/21 0956  BP: (!) 169/73   Pulse: (!) 53 (!) 56  Resp: (!) 24 (!) 22  Temp:    SpO2: 69% 86%    Complications: No apparent anesthesia complications

## 2021-10-13 NOTE — Anesthesia Procedure Notes (Signed)
Procedure Name: Intubation Date/Time: 10/13/2021 10:57 AM  Performed by: Lavina Hamman, CRNAPre-anesthesia Checklist: Patient identified, Emergency Drugs available, Suction available and Patient being monitored Patient Re-evaluated:Patient Re-evaluated prior to induction Oxygen Delivery Method: Circle System Utilized Preoxygenation: Pre-oxygenation with 100% oxygen Induction Type: IV induction Ventilation: Mask ventilation without difficulty Laryngoscope Size: Mac and 3 Grade View: Grade II Tube type: Oral Number of attempts: 1 Airway Equipment and Method: Stylet and Oral airway Placement Confirmation: ETT inserted through vocal cords under direct vision, positive ETCO2 and breath sounds checked- equal and bilateral Secured at: 22 cm Tube secured with: Tape Dental Injury: Teeth and Oropharynx as per pre-operative assessment

## 2021-10-13 NOTE — Discharge Instructions (Signed)

## 2021-10-14 ENCOUNTER — Encounter (HOSPITAL_COMMUNITY): Payer: Self-pay | Admitting: Orthopedic Surgery

## 2021-10-14 DIAGNOSIS — I10 Essential (primary) hypertension: Secondary | ICD-10-CM | POA: Diagnosis not present

## 2021-10-14 DIAGNOSIS — Z7984 Long term (current) use of oral hypoglycemic drugs: Secondary | ICD-10-CM | POA: Diagnosis not present

## 2021-10-14 DIAGNOSIS — Z87891 Personal history of nicotine dependence: Secondary | ICD-10-CM | POA: Diagnosis not present

## 2021-10-14 DIAGNOSIS — Z79899 Other long term (current) drug therapy: Secondary | ICD-10-CM | POA: Diagnosis not present

## 2021-10-14 DIAGNOSIS — E119 Type 2 diabetes mellitus without complications: Secondary | ICD-10-CM | POA: Diagnosis not present

## 2021-10-14 DIAGNOSIS — M1711 Unilateral primary osteoarthritis, right knee: Secondary | ICD-10-CM | POA: Diagnosis not present

## 2021-10-14 LAB — CBC
HCT: 31.7 % — ABNORMAL LOW (ref 36.0–46.0)
Hemoglobin: 10.6 g/dL — ABNORMAL LOW (ref 12.0–15.0)
MCH: 31.1 pg (ref 26.0–34.0)
MCHC: 33.4 g/dL (ref 30.0–36.0)
MCV: 93 fL (ref 80.0–100.0)
Platelets: 294 10*3/uL (ref 150–400)
RBC: 3.41 MIL/uL — ABNORMAL LOW (ref 3.87–5.11)
RDW: 12.6 % (ref 11.5–15.5)
WBC: 17.7 10*3/uL — ABNORMAL HIGH (ref 4.0–10.5)
nRBC: 0 % (ref 0.0–0.2)

## 2021-10-14 LAB — BASIC METABOLIC PANEL
Anion gap: 11 (ref 5–15)
BUN: 22 mg/dL (ref 8–23)
CO2: 23 mmol/L (ref 22–32)
Calcium: 7.6 mg/dL — ABNORMAL LOW (ref 8.9–10.3)
Chloride: 102 mmol/L (ref 98–111)
Creatinine, Ser: 1.2 mg/dL — ABNORMAL HIGH (ref 0.44–1.00)
GFR, Estimated: 48 mL/min — ABNORMAL LOW (ref >60)
Glucose, Bld: 226 mg/dL — ABNORMAL HIGH (ref 70–99)
Potassium: 3.8 mmol/L (ref 3.5–5.1)
Sodium: 136 mmol/L (ref 135–145)

## 2021-10-14 LAB — GLUCOSE, CAPILLARY
Glucose-Capillary: 165 mg/dL — ABNORMAL HIGH (ref 70–99)
Glucose-Capillary: 164 mg/dL — ABNORMAL HIGH (ref 70–99)
Glucose-Capillary: 145 mg/dL — ABNORMAL HIGH (ref 70–99)

## 2021-10-14 MED ORDER — ASPIRIN 81 MG PO CHEW: 81.0000 mg | CHEWABLE_TABLET | Freq: Two times a day (BID) | ORAL | 0 refills | Status: AC

## 2021-10-14 MED ORDER — OXYCODONE HCL 5 MG PO TABS: 5.0000 mg | ORAL_TABLET | ORAL | 0 refills | Status: DC | PRN

## 2021-10-14 MED ORDER — ASPIRIN 81 MG PO CHEW: 81.0000 mg | CHEWABLE_TABLET | Freq: Two times a day (BID) | ORAL | 0 refills | Status: DC

## 2021-10-14 MED ORDER — DOXYCYCLINE HYCLATE 100 MG PO TABS: 100.0000 mg | ORAL_TABLET | Freq: Two times a day (BID) | ORAL | 0 refills | Status: DC

## 2021-10-14 MED ORDER — DOXYCYCLINE HYCLATE 100 MG PO TABS
100.0000 mg | ORAL_TABLET | Freq: Two times a day (BID) | ORAL | Status: DC
  Administered 2021-10-14: 100 mg via ORAL
  Filled 2021-10-14: qty 1

## 2021-10-14 MED ORDER — DOCUSATE SODIUM 100 MG PO CAPS: 100.0000 mg | ORAL_CAPSULE | Freq: Two times a day (BID) | ORAL | 0 refills | Status: DC

## 2021-10-14 MED ORDER — DOXYCYCLINE HYCLATE 100 MG PO TABS
100.0000 mg | ORAL_TABLET | Freq: Two times a day (BID) | ORAL | 0 refills | Status: AC
Start: 2021-10-14 — End: 2021-10-28

## 2021-10-14 MED ORDER — METHOCARBAMOL 500 MG PO TABS: 500.0000 mg | ORAL_TABLET | Freq: Four times a day (QID) | ORAL | 0 refills | Status: DC | PRN

## 2021-10-14 MED ORDER — POLYETHYLENE GLYCOL 3350 17 G PO PACK: 17.0000 g | PACK | Freq: Every day | ORAL | 0 refills | Status: DC | PRN

## 2021-10-14 MED ORDER — ACETAMINOPHEN 325 MG PO TABS: 1000.0000 mg | ORAL_TABLET | Freq: Four times a day (QID) | ORAL | Status: DC | PRN

## 2021-10-14 MED ORDER — OXYCODONE HCL 5 MG PO TABS
5.0000 mg | ORAL_TABLET | ORAL | 0 refills | Status: DC | PRN
Start: 2021-10-14 — End: 2023-02-01

## 2021-10-14 NOTE — Progress Notes (Signed)
   Subjective: 1 Day Post-Op Procedure(s) (LRB): TOTAL KNEE ARTHROPLASTY (Right) Patient reports pain as mild.   Patient seen in rounds with Dr. [name]. Patient is well, and has had no acute complaints or problems. No acute events overnight. Foley catheter removed. Patient ambulated 20 feet with PT.  We will start therapy today.   Objective: Vital signs in last 24 hours: Temp:  [97.5 F (36.4 C)-98.1 F (36.7 C)] 97.8 F (36.6 C) (06/21 0547) Pulse Rate:  [53-75] 65 (06/21 0547) Resp:  [13-24] 18 (06/21 0547) BP: (143-202)/(68-105) 143/73 (06/21 0547) SpO2:  [94 %-100 %] 97 % (06/21 0547)  Intake/Output from previous day:  Intake/Output Summary (Last 24 hours) at 10/14/2021 0800 Last data filed at 10/14/2021 0700 Gross per 24 hour  Intake 3567.03 ml  Output 400 ml  Net 3167.03 ml     Intake/Output this shift: No intake/output data recorded.  Labs: Recent Labs    10/14/21 0334  HGB 10.6*   Recent Labs    10/14/21 0334  WBC 17.7*  RBC 3.41*  HCT 31.7*  PLT 294   Recent Labs    10/14/21 0334  NA 136  K 3.8  CL 102  CO2 23  BUN 22  CREATININE 1.20*  GLUCOSE 226*  CALCIUM 7.6*   No results for input(s): "LABPT", "INR" in the last 72 hours.  Exam: General - Patient is Alert and Oriented Extremity - Neurologically intact Sensation intact distally Intact pulses distally Dorsiflexion/Plantar flexion intact Dressing - dressing C/D/I Motor Function - intact, moving foot and toes well on exam.   Past Medical History:  Diagnosis Date   Arthritis    Cervical incompetence    Diabetes mellitus without complication (HCC)    Family history of adverse reaction to anesthesia    Mother with nausea   GERD (gastroesophageal reflux disease)    Hay fever    Heart murmur    Hemochromatosis    Hypertension    Migraines    Sciatica     Assessment/Plan: 1 Day Post-Op Procedure(s) (LRB): TOTAL KNEE ARTHROPLASTY (Right) Principal Problem:   S/P total knee  arthroplasty, right  Estimated body mass index is 34.88 kg/m as calculated from the following:   Height as of this encounter: '5\' 8"'$  (1.727 m).   Weight as of this encounter: 104.1 kg. Advance diet Up with therapy D/C IV fluids   Patient's anticipated LOS is less than 2 midnights, meeting these requirements: - Younger than 68 - Lives within 1 hour of care - Has a competent adult at home to recover with post-op recover - NO history of  - Chronic pain requiring opiods  - Diabetes  - Coronary Artery Disease  - Heart failure  - Heart attack  - Stroke  - DVT/VTE  - Cardiac arrhythmia  - Respiratory Failure/COPD  - Renal failure  - Anemia  - Advanced Liver disease     DVT Prophylaxis - Aspirin Weight bearing as tolerated.  Cr. Elevated from 1.02 to 1.20. Will d/c celebrex.  Tick was found on the patient yesterday, will d/c on doxycycline.   Plan is to go Home after hospital stay. Plan for discharge today following 1-2 sessions of PT as long as they are meeting their goals. Patient is scheduled for OPPT. Follow up in the office in 2 weeks.   Griffith Citron, PA-C Orthopedic Surgery [phone] 10/14/2021, 8:00 AM

## 2021-10-14 NOTE — Progress Notes (Signed)
Physical Therapy Treatment Patient Details Name: [name] MRN: [id number] DOB: 1950-05-28 Today's Date: 10/14/2021   History of Present Illness Pt is a 71yo female presenting s/p R-TKA on 10/13/21. PMH: OA, DM, GERD, HTN. R-RCR    PT Comments    The patient reports minimal pain right knee. Patient practiced steps and ambulated, performed HEP. Will see again to finalize DC  needs .  Recommendations for follow up therapy are one component of a multi-disciplinary discharge planning process, led by the attending physician.  Recommendations may be updated based on patient status, additional functional criteria and insurance authorization.  Follow Up Recommendations  Follow physician's recommendations for discharge plan and follow up therapies     Assistance Recommended at Discharge Intermittent Supervision/Assistance  Patient can return home with the following A little help with walking and/or transfers;A little help with bathing/dressing/bathroom;Assistance with cooking/housework;Assist for transportation;Help with stairs or ramp for entrance   Equipment Recommendations  None recommended by PT    Recommendations for Other Services       Precautions / Restrictions Precautions Precautions: Fall;Knee     Mobility  Bed Mobility   Bed Mobility: Supine to Sit     Supine to sit: Supervision     General bed mobility comments: lifts right leg withoout support    Transfers Overall transfer level: Needs assistance Equipment used: Rolling walker (2 wheels)   Sit to Stand: Min guard, From elevated surface           General transfer comment: cues for hand and roight leg position to sit down    Ambulation/Gait Ambulation/Gait assistance: Min guard Gait Distance (Feet): 60 Feet Assistive device: Rolling walker (2 wheels) Gait Pattern/deviations: Step-to pattern, Step-through pattern       General Gait Details: cues for sequence   Stairs Stairs: Yes Stairs  assistance: Min guard Stair Management: Two rails, Forwards Number of Stairs: 2 General stair comments: cues for sequence   Wheelchair Mobility    Modified Rankin (Stroke Patients Only)       Balance Overall balance assessment: Mild deficits observed, not formally tested                                          Cognition Arousal/Alertness: Awake/alert                                              Exercises Total Joint Exercises Ankle Circles/Pumps: AROM, Both, 10 reps Quad Sets: AROM, Both, 10 reps Heel Slides: AROM, Right, 10 reps Straight Leg Raises: AROM, Right, 10 reps Long Arc Quad: AROM, Right, 10 reps Knee Flexion: AROM, Right, 10 reps Goniometric ROM: 10-60 right knee flex    General Comments        Pertinent Vitals/Pain Pain Assessment Pain Score: 2  Pain Location: right knee Pain Descriptors / Indicators: Operative site guarding, Discomfort Pain Intervention(s): Monitored during session, Premedicated before session, Ice applied    Home Living                          Prior Function            PT Goals (current goals can now be found in the care plan section) Progress towards PT goals: Progressing  toward goals    Frequency    7X/week      PT Plan Current plan remains appropriate    Co-evaluation              AM-PAC PT "6 Clicks" Mobility   Outcome Measure  Help needed turning from your back to your side while in a flat bed without using bedrails?: None Help needed moving from lying on your back to sitting on the side of a flat bed without using bedrails?: None Help needed moving to and from a bed to a chair (including a wheelchair)?: A Little Help needed standing up from a chair using your arms (e.g., wheelchair or bedside chair)?: A Little Help needed to walk in hospital room?: A Little Help needed climbing 3-5 steps with a railing? : A Little 6 Click Score: 20    End of Session  Equipment Utilized During Treatment: Gait belt Activity Tolerance: Patient tolerated treatment well;No increased pain Patient left: in chair;with call bell/phone within reach;with chair alarm set Nurse Communication: Mobility status PT Visit Diagnosis: Difficulty in walking, not elsewhere classified (R26.2);Pain Pain - Right/Left: Right Pain - part of body: Knee     Time: 7116-5790 PT Time Calculation (min) (ACUTE ONLY): 34 min  Charges:  $Gait Training: 8-22 mins $Therapeutic Exercise: 8-22 mins                     [place] Office [phone] Weekend [phone]    Claretha Cooper 10/14/2021, 1:55 PM

## 2021-10-14 NOTE — TOC Transition Note (Signed)
Transition of Care [facility]) - CM/SW Discharge Note  Patient Details  Name: [name] MRN: [id number] Date of Birth: 1950-11-12  [facility] of [facility] CM/SW Contact:  Sherie Don, LCSW Phone Number: 10/14/2021, 10:02 AM  Clinical Narrative: Patient is expected to discharge home after working with PT. CSW met with patient to confirm discharge plan. Patient will go home with OPPT at [facility]. Patient has a rolling walker, cane, and elevated toilet at home so there are no DME needs at this time. TOC signing off.  Final next level of care: OP Rehab Barriers to Discharge: No Barriers Identified  Patient Goals and CMS Choice Patient states their goals for this hospitalization and ongoing recovery are:: Discharge home with OPPT at PT and [facility] [place] Choice offered to / list presented to : NA  Discharge Plan and Services        DME Arranged: N/A DME Agency: NA HH Arranged: NA [place] Agency: NA  Readmission Risk Interventions     No data to display

## 2021-10-14 NOTE — Telephone Encounter (Signed)
What are your thoughts on simplifying her regimen and stopping dilitazem and increasing verapamil to '360mg'$ ? She was not on the highest dose of either one. Silly to take 3 medications (2 CCB and 1 BB) if you can get away with 1 CCB and 1 BB.

## 2021-10-14 NOTE — Telephone Encounter (Signed)
Dian Situ, pharmacist, notified and verbalized understanding.

## 2021-10-14 NOTE — Telephone Encounter (Signed)
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 Will try again at that time.

## 2021-10-14 NOTE — Progress Notes (Signed)
Physical Therapy Treatment Patient Details Name: [name] MRN: [id number] DOB: 1951/01/21 Today's Date: 10/14/2021   History of Present Illness Pt is a 71yo female presenting s/p R-TKA on 10/13/21. PMH: OA, DM, GERD, HTN. R-RCR    PT Comments    Patient feels confident to Dc home, reviewed HEP, OPPT starts Monday.  Patient has reached goals  to Dc home with family.   Recommendations for follow up therapy are one component of a multi-disciplinary discharge planning process, led by the attending physician.  Recommendations may be updated based on patient status, additional functional criteria and insurance authorization.  Follow Up Recommendations  Follow physician's recommendations for discharge plan and follow up therapies     Assistance Recommended at Discharge Intermittent Supervision/Assistance  Patient can return home with the following A little help with walking and/or transfers;A little help with bathing/dressing/bathroom;Assistance with cooking/housework;Assist for transportation;Help with stairs or ramp for entrance   Equipment Recommendations  None recommended by PT    Recommendations for Other Services       Precautions / Restrictions Precautions Precautions: Fall;Knee     Mobility  Bed Mobility            General bed mobility comments: in recliner    Transfers Overall transfer level: Needs assistance Equipment used: Rolling walker (2 wheels) Transfers: Sit to/from Stand Sit to Stand: Supervision           General transfer comment: cues for hand and right leg position to sit down    Ambulation/Gait Ambulation/Gait assistance: Supervision Gait Distance (Feet): 20 Feet (x 2) Assistive device: Rolling walker (2 wheels) Gait Pattern/deviations: Step-to pattern, Step-through pattern       General Gait Details: cues for sequence   Stairs Stairs: Yes Stairs assistance: Min guard Stair Management: Two rails, Forwards Number of Stairs:  2 General stair comments: cues for sequence   Wheelchair Mobility    Modified Rankin (Stroke Patients Only)       Balance Overall balance assessment: Mild deficits observed, not formally tested                                          Cognition Arousal/Alertness: Awake/alert                                              Exercises    General Comments        Pertinent Vitals/Pain Pain Assessment Pain Score: 2  Pain Location: right knee Pain Descriptors / Indicators: Operative site guarding, Discomfort Pain Intervention(s): Monitored during session    Home Living                          Prior Function            PT Goals (current goals can now be found in the care plan section) Progress towards PT goals: Progressing toward goals    Frequency    7X/week      PT Plan Current plan remains appropriate    Co-evaluation              AM-PAC PT "6 Clicks" Mobility   Outcome Measure  Help needed turning from your back to your side while in a flat bed without using  bedrails?: None Help needed moving from lying on your back to sitting on the side of a flat bed without using bedrails?: None Help needed moving to and from a bed to a chair (including a wheelchair)?: A Little Help needed standing up from a chair using your arms (e.g., wheelchair or bedside chair)?: A Little Help needed to walk in hospital room?: A Little Help needed climbing 3-5 steps with a railing? : A Little 6 Click Score: 20    End of Session Equipment Utilized During Treatment: Gait belt Activity Tolerance: Patient tolerated treatment well Patient left: in chair;with call bell/phone within reach;with chair alarm set Nurse Communication: Mobility status PT Visit Diagnosis: Difficulty in walking, not elsewhere classified (R26.2);Pain Pain - Right/Left: Right Pain - part of body: Knee     Time: 1320-1340 PT Time Calculation (min) (ACUTE  ONLY): 20 min  Charges:  $Gait Training: 8-22 mins $      [place] Office [phone] Weekend [phone]   Claretha Cooper 10/14/2021, 1:58 PM

## 2021-10-14 NOTE — Plan of Care (Signed)
Pt ready to DC home when husband arrives.

## 2021-10-17 ENCOUNTER — Encounter: Payer: Self-pay | Admitting: Cardiovascular Disease

## 2021-10-19 ENCOUNTER — Other Ambulatory Visit: Payer: Self-pay

## 2021-10-19 DIAGNOSIS — Z96651 Presence of right artificial knee joint: Secondary | ICD-10-CM | POA: Diagnosis not present

## 2021-10-19 DIAGNOSIS — R262 Difficulty in walking, not elsewhere classified: Secondary | ICD-10-CM | POA: Diagnosis not present

## 2021-10-19 DIAGNOSIS — M25561 Pain in right knee: Secondary | ICD-10-CM | POA: Diagnosis not present

## 2021-10-19 MED ORDER — VERAPAMIL HCL ER 240 MG PO TBCR: 360.0000 mg | EXTENDED_RELEASE_TABLET | Freq: Every day | ORAL | 3 refills | Status: DC

## 2021-10-20 NOTE — Telephone Encounter (Signed)
I reviewed changes with patient. Also called PCP- Left a message for Terie Purser, PA (ordering provider of diltiazem) and made her aware of the change.

## 2021-10-21 DIAGNOSIS — M25561 Pain in right knee: Secondary | ICD-10-CM | POA: Diagnosis not present

## 2021-10-21 DIAGNOSIS — R262 Difficulty in walking, not elsewhere classified: Secondary | ICD-10-CM | POA: Diagnosis not present

## 2021-10-21 DIAGNOSIS — Z96651 Presence of right artificial knee joint: Secondary | ICD-10-CM | POA: Diagnosis not present

## 2021-10-26 DIAGNOSIS — M25561 Pain in right knee: Secondary | ICD-10-CM | POA: Diagnosis not present

## 2021-10-26 DIAGNOSIS — Z96651 Presence of right artificial knee joint: Secondary | ICD-10-CM | POA: Diagnosis not present

## 2021-10-26 DIAGNOSIS — R262 Difficulty in walking, not elsewhere classified: Secondary | ICD-10-CM | POA: Diagnosis not present

## 2021-10-28 NOTE — Discharge Summary (Signed)
Patient ID: [name] MRN: [id number] DOB/AGE: [birth year] 73 y.o.  Admit date: 10/13/2021 Discharge date: 10/14/2021  Admission Diagnoses:  Right knee osteoarthritis  Discharge Diagnoses:  Principal Problem:   S/P total knee arthroplasty, right   Past Medical History:  Diagnosis Date   Arthritis    Cervical incompetence    Diabetes mellitus without complication (HCC)    Family history of adverse reaction to anesthesia    Mother with nausea   GERD (gastroesophageal reflux disease)    Hay fever    Heart murmur    Hemochromatosis    Hypertension    Migraines    Sciatica     Surgeries: Procedure(s): TOTAL KNEE ARTHROPLASTY on 10/13/2021   Consultants:   Discharged Condition: Improved  Hospital Course: [name] is an 71 y.o. female who was admitted 10/13/2021 for operative treatment ofS/P total knee arthroplasty, right. Patient has severe unremitting pain that affects sleep, daily activities, and work/hobbies. After pre-op clearance the patient was taken to the operating room on 10/13/2021 and underwent  Procedure(s): TOTAL KNEE ARTHROPLASTY.    Patient was given perioperative antibiotics:  Anti-infectives (From admission, onward)    Start     Dose/Rate Route Frequency Ordered Stop   10/14/21 1000  doxycycline (VIBRA-TABS) tablet 100 mg  Status:  Discontinued        100 mg Oral Every 12 hours 10/14/21 0710 10/14/21 1943   10/14/21 0000  doxycycline (VIBRA-TABS) 100 MG tablet  Status:  Discontinued        100 mg Oral Every 12 hours 10/14/21 0806 10/14/21    10/14/21 0000  doxycycline (VIBRA-TABS) 100 MG tablet        100 mg Oral Every 12 hours 10/14/21 1026 10/28/21 2359   10/13/21 1600  ceFAZolin (ANCEF) IVPB 2g/100 mL premix        2 g 200 mL/hr over 30 Minutes Intravenous Every 6 hours 10/13/21 1404 10/13/21 2237   10/13/21 0730  ceFAZolin (ANCEF) IVPB 2g/100 mL premix        2 g 200 mL/hr over 30 Minutes Intravenous On call to O.R. 10/13/21 6073 10/13/21 1037         Patient was given sequential compression devices, early ambulation, and chemoprophylaxis to prevent DVT. Patient worked with PT and was meeting their goals regarding safe ambulation and transfers.  Patient benefited maximally from hospital stay and there were no complications.    Recent vital signs: No data found.   Recent laboratory studies: No results for input(s): "WBC", "HGB", "HCT", "PLT", "NA", "K", "CL", "CO2", "BUN", "CREATININE", "GLUCOSE", "INR", "CALCIUM" in the last 72 hours.  Invalid input(s): "PT", "2"   Discharge Medications:   Allergies as of 10/14/2021   No Known Allergies      Medication List     STOP taking these medications    cephALEXin 500 MG capsule Commonly known as: KEFLEX       TAKE these medications    acetaminophen 325 MG tablet Commonly known as: TYLENOL Take 3 tablets (975 mg total) by mouth every 6 (six) hours as needed for mild pain (pain score 1-3 or temp > 100.5).   aspirin 81 MG chewable tablet Chew 1 tablet (81 mg total) by mouth 2 (two) times daily for 28 days.   docusate sodium 100 MG capsule Commonly known as: COLACE Take 1 capsule (100 mg total) by mouth 2 (two) times daily.   doxycycline 100 MG tablet Commonly known as: VIBRA-TABS Take 1 tablet (100 mg total)  by mouth every 12 (twelve) hours for 14 days.   ezetimibe 10 MG tablet Commonly known as: ZETIA Take 10 mg by mouth daily.   fluticasone 50 MCG/ACT nasal spray Commonly known as: FLONASE Place 1-2 sprays into both nostrils as needed for allergies or rhinitis.   hydrochlorothiazide 25 MG tablet Commonly known as: HYDRODIURIL Take 1 tablet (25 mg total) by mouth daily as needed ([place]).   hydrocortisone cream 1 % Apply 1 application. topically daily as needed for itching.   losartan 50 MG tablet Commonly known as: COZAAR Take 50 mg by mouth daily.   metFORMIN 1000 MG tablet Commonly known as: GLUCOPHAGE Take 1,000 mg by mouth 2 (two) times  daily.   methocarbamol 500 MG tablet Commonly known as: ROBAXIN Take 1 tablet (500 mg total) by mouth every 6 (six) hours as needed for muscle spasms.   metoprolol succinate 50 MG 24 hr tablet Commonly known as: TOPROL-XL Take 25 mg by mouth at bedtime.   omeprazole 20 MG capsule Commonly known as: PRILOSEC Take 20 mg by mouth daily.   oxyCODONE 5 MG immediate release tablet Commonly known as: Oxy IR/ROXICODONE Take 1-2 tablets (5-10 mg total) by mouth every 4 (four) hours as needed for severe pain. Start with 1 tablet by mouth every 4 hours as needed. Take 2 only for severe pain   polyethylene glycol 17 g packet Commonly known as: MIRALAX / GLYCOLAX Take 17 g by mouth daily as needed for mild constipation.   zolpidem 10 MG tablet Commonly known as: AMBIEN Take 5 mg by mouth at bedtime as needed for sleep.               Discharge Care Instructions  (From admission, onward)           Start     Ordered   10/14/21 0000  Change dressing       Comments: Maintain surgical dressing until follow up in the clinic. If the edges start to pull up, may reinforce with tape. If the dressing is no longer working, may remove and cover with gauze and tape, but must keep the area dry and clean.  Call with any questions or concerns.   10/14/21 0806            Diagnostic Studies: No results found.  Disposition: Discharge disposition: 01-Home or Self Care       Discharge Instructions     Call MD / Call 911   Complete by: As directed    If you experience chest pain or shortness of breath, CALL 911 and be transported to the hospital emergency room.  If you develope a fever above 101 F, pus (white drainage) or increased drainage or redness at the wound, or calf pain, call your surgeon's office.   Change dressing   Complete by: As directed    Maintain surgical dressing until follow up in the clinic. If the edges start to pull up, may reinforce with tape. If the dressing is  no longer working, may remove and cover with gauze and tape, but must keep the area dry and clean.  Call with any questions or concerns.   Constipation Prevention   Complete by: As directed    Drink plenty of fluids.  Prune juice may be helpful.  You may use a stool softener, such as Colace (over the counter) 100 mg twice a day.  Use MiraLax (over the counter) for constipation as needed.   Diet - low sodium heart healthy  Complete by: As directed    Increase activity slowly as tolerated   Complete by: As directed    Weight bearing as tolerated with assist device (walker, cane, etc) as directed, use it as long as suggested by your surgeon or therapist, typically at least 4-6 weeks.   Post-operative opioid taper instructions:   Complete by: As directed    POST-OPERATIVE OPIOID TAPER INSTRUCTIONS: It is important to wean off of your opioid medication as soon as possible. If you do not need pain medication after your surgery it is ok to stop day one. Opioids include: Codeine, Hydrocodone(Norco, Vicodin), Oxycodone(Percocet, oxycontin) and hydromorphone amongst others.  Long term and even short term use of opiods can cause: Increased pain response Dependence Constipation Depression Respiratory depression And more.  Withdrawal symptoms can include Flu like symptoms Nausea, vomiting And more Techniques to manage these symptoms Hydrate well Eat regular healthy meals Stay active Use relaxation techniques(deep breathing, meditating, yoga) Do Not substitute Alcohol to help with tapering If you have been on opioids for less than two weeks and do not have pain than it is ok to stop all together.  Plan to wean off of opioids This plan should start within one week post op of your joint replacement. Maintain the same interval or time between taking each dose and first decrease the dose.  Cut the total daily intake of opioids by one tablet each day Next start to increase the time between  doses. The last dose that should be eliminated is the evening dose.      TED hose   Complete by: As directed    Use stockings (TED hose) for 2 weeks on both leg(s).  You may remove them at night for sleeping.        Follow-up Information     Paralee Cancel, MD. Schedule an appointment as soon as possible for a visit in 2 week(s).   Specialty: Orthopedic Surgery Contact information: [address] [place] [address] [phone]                  Signed: Irving Copas 10/28/2021, 9:14 AM

## 2021-10-29 DIAGNOSIS — Z96651 Presence of right artificial knee joint: Secondary | ICD-10-CM | POA: Diagnosis not present

## 2021-10-29 DIAGNOSIS — R262 Difficulty in walking, not elsewhere classified: Secondary | ICD-10-CM | POA: Diagnosis not present

## 2021-10-29 DIAGNOSIS — M25561 Pain in right knee: Secondary | ICD-10-CM | POA: Diagnosis not present

## 2021-11-04 DIAGNOSIS — R262 Difficulty in walking, not elsewhere classified: Secondary | ICD-10-CM | POA: Diagnosis not present

## 2021-11-04 DIAGNOSIS — M25561 Pain in right knee: Secondary | ICD-10-CM | POA: Diagnosis not present

## 2021-11-04 DIAGNOSIS — Z96651 Presence of right artificial knee joint: Secondary | ICD-10-CM | POA: Diagnosis not present

## 2021-11-10 DIAGNOSIS — Z96651 Presence of right artificial knee joint: Secondary | ICD-10-CM | POA: Diagnosis not present

## 2021-11-10 DIAGNOSIS — M25561 Pain in right knee: Secondary | ICD-10-CM | POA: Diagnosis not present

## 2021-11-10 DIAGNOSIS — R262 Difficulty in walking, not elsewhere classified: Secondary | ICD-10-CM | POA: Diagnosis not present

## 2021-11-12 DIAGNOSIS — M25561 Pain in right knee: Secondary | ICD-10-CM | POA: Diagnosis not present

## 2021-11-12 DIAGNOSIS — Z96651 Presence of right artificial knee joint: Secondary | ICD-10-CM | POA: Diagnosis not present

## 2021-11-12 DIAGNOSIS — R262 Difficulty in walking, not elsewhere classified: Secondary | ICD-10-CM | POA: Diagnosis not present

## 2021-11-18 DIAGNOSIS — Z96651 Presence of right artificial knee joint: Secondary | ICD-10-CM | POA: Diagnosis not present

## 2021-11-18 DIAGNOSIS — Z471 Aftercare following joint replacement surgery: Secondary | ICD-10-CM | POA: Diagnosis not present

## 2021-11-30 DIAGNOSIS — E7849 Other hyperlipidemia: Secondary | ICD-10-CM | POA: Diagnosis not present

## 2021-11-30 DIAGNOSIS — R002 Palpitations: Secondary | ICD-10-CM | POA: Diagnosis not present

## 2021-11-30 DIAGNOSIS — R011 Cardiac murmur, unspecified: Secondary | ICD-10-CM | POA: Diagnosis not present

## 2021-11-30 DIAGNOSIS — E119 Type 2 diabetes mellitus without complications: Secondary | ICD-10-CM | POA: Diagnosis not present

## 2021-11-30 DIAGNOSIS — G4700 Insomnia, unspecified: Secondary | ICD-10-CM | POA: Diagnosis not present

## 2021-11-30 DIAGNOSIS — Q248 Other specified congenital malformations of heart: Secondary | ICD-10-CM | POA: Diagnosis not present

## 2021-11-30 DIAGNOSIS — E782 Mixed hyperlipidemia: Secondary | ICD-10-CM | POA: Diagnosis not present

## 2022-01-29 DIAGNOSIS — R350 Frequency of micturition: Secondary | ICD-10-CM | POA: Diagnosis not present

## 2022-01-29 DIAGNOSIS — E782 Mixed hyperlipidemia: Secondary | ICD-10-CM | POA: Diagnosis not present

## 2022-01-29 DIAGNOSIS — N390 Urinary tract infection, site not specified: Secondary | ICD-10-CM | POA: Diagnosis not present

## 2022-01-29 DIAGNOSIS — Z0001 Encounter for general adult medical examination with abnormal findings: Secondary | ICD-10-CM | POA: Diagnosis not present

## 2022-01-29 DIAGNOSIS — N182 Chronic kidney disease, stage 2 (mild): Secondary | ICD-10-CM | POA: Diagnosis not present

## 2022-01-29 DIAGNOSIS — R011 Cardiac murmur, unspecified: Secondary | ICD-10-CM | POA: Diagnosis not present

## 2022-01-29 DIAGNOSIS — Q248 Other specified congenital malformations of heart: Secondary | ICD-10-CM | POA: Diagnosis not present

## 2022-01-29 DIAGNOSIS — E119 Type 2 diabetes mellitus without complications: Secondary | ICD-10-CM | POA: Diagnosis not present

## 2022-01-29 DIAGNOSIS — Z23 Encounter for immunization: Secondary | ICD-10-CM | POA: Diagnosis not present

## 2022-03-05 ENCOUNTER — Other Ambulatory Visit: Payer: Self-pay | Admitting: Family Medicine

## 2022-03-05 DIAGNOSIS — Z1231 Encounter for screening mammogram for malignant neoplasm of breast: Secondary | ICD-10-CM

## 2022-03-12 ENCOUNTER — Ambulatory Visit
Admission: RE | Admit: 2022-03-12 | Discharge: 2022-03-12 | Disposition: A | Discharge status: Home / Self Care | Payer: Medicare Other | Source: Ambulatory Visit | Attending: Family Medicine | Admitting: Family Medicine

## 2022-03-12 DIAGNOSIS — Z1231 Encounter for screening mammogram for malignant neoplasm of breast: Secondary | ICD-10-CM | POA: Diagnosis not present

## 2022-04-18 DIAGNOSIS — R030 Elevated blood-pressure reading, without diagnosis of hypertension: Secondary | ICD-10-CM | POA: Diagnosis not present

## 2022-04-18 DIAGNOSIS — R300 Dysuria: Secondary | ICD-10-CM | POA: Diagnosis not present

## 2022-06-17 DIAGNOSIS — R300 Dysuria: Secondary | ICD-10-CM | POA: Diagnosis not present

## 2022-07-05 DIAGNOSIS — N182 Chronic kidney disease, stage 2 (mild): Secondary | ICD-10-CM | POA: Diagnosis not present

## 2022-07-05 DIAGNOSIS — N390 Urinary tract infection, site not specified: Secondary | ICD-10-CM | POA: Diagnosis not present

## 2022-07-05 DIAGNOSIS — E119 Type 2 diabetes mellitus without complications: Secondary | ICD-10-CM | POA: Diagnosis not present

## 2022-07-29 DIAGNOSIS — I422 Other hypertrophic cardiomyopathy: Secondary | ICD-10-CM | POA: Diagnosis not present

## 2022-07-29 DIAGNOSIS — I350 Nonrheumatic aortic (valve) stenosis: Secondary | ICD-10-CM | POA: Diagnosis not present

## 2022-07-29 DIAGNOSIS — I10 Essential (primary) hypertension: Secondary | ICD-10-CM | POA: Diagnosis not present

## 2022-08-06 DIAGNOSIS — E1100 Type 2 diabetes mellitus with hyperosmolarity without nonketotic hyperglycemic-hyperosmolar coma (NKHHC): Secondary | ICD-10-CM | POA: Diagnosis not present

## 2022-08-06 DIAGNOSIS — E7849 Other hyperlipidemia: Secondary | ICD-10-CM | POA: Diagnosis not present

## 2022-08-06 DIAGNOSIS — E782 Mixed hyperlipidemia: Secondary | ICD-10-CM | POA: Diagnosis not present

## 2022-08-06 DIAGNOSIS — I10 Essential (primary) hypertension: Secondary | ICD-10-CM | POA: Diagnosis not present

## 2022-08-13 DIAGNOSIS — I422 Other hypertrophic cardiomyopathy: Secondary | ICD-10-CM | POA: Diagnosis not present

## 2022-08-17 DIAGNOSIS — L570 Actinic keratosis: Secondary | ICD-10-CM | POA: Diagnosis not present

## 2022-08-17 DIAGNOSIS — D485 Neoplasm of uncertain behavior of skin: Secondary | ICD-10-CM | POA: Diagnosis not present

## 2022-08-26 DIAGNOSIS — I10 Essential (primary) hypertension: Secondary | ICD-10-CM | POA: Diagnosis not present

## 2022-09-07 DIAGNOSIS — I10 Essential (primary) hypertension: Secondary | ICD-10-CM | POA: Diagnosis not present

## 2022-09-07 DIAGNOSIS — E119 Type 2 diabetes mellitus without complications: Secondary | ICD-10-CM | POA: Diagnosis not present

## 2022-09-07 DIAGNOSIS — H43393 Other vitreous opacities, bilateral: Secondary | ICD-10-CM | POA: Diagnosis not present

## 2022-09-07 DIAGNOSIS — N390 Urinary tract infection, site not specified: Secondary | ICD-10-CM | POA: Diagnosis not present

## 2022-09-07 DIAGNOSIS — E1159 Type 2 diabetes mellitus with other circulatory complications: Secondary | ICD-10-CM | POA: Diagnosis not present

## 2022-10-06 DIAGNOSIS — I422 Other hypertrophic cardiomyopathy: Secondary | ICD-10-CM | POA: Diagnosis not present

## 2022-10-06 DIAGNOSIS — I350 Nonrheumatic aortic (valve) stenosis: Secondary | ICD-10-CM | POA: Diagnosis not present

## 2022-10-18 DIAGNOSIS — Z79899 Other long term (current) drug therapy: Secondary | ICD-10-CM | POA: Diagnosis not present

## 2022-10-18 DIAGNOSIS — J811 Chronic pulmonary edema: Secondary | ICD-10-CM | POA: Diagnosis not present

## 2022-10-18 DIAGNOSIS — Z01810 Encounter for preprocedural cardiovascular examination: Secondary | ICD-10-CM | POA: Diagnosis not present

## 2022-10-18 DIAGNOSIS — E1169 Type 2 diabetes mellitus with other specified complication: Secondary | ICD-10-CM | POA: Diagnosis not present

## 2022-10-18 DIAGNOSIS — Z452 Encounter for adjustment and management of vascular access device: Secondary | ICD-10-CM | POA: Diagnosis not present

## 2022-10-18 DIAGNOSIS — D631 Anemia in chronic kidney disease: Secondary | ICD-10-CM | POA: Diagnosis not present

## 2022-10-18 DIAGNOSIS — Z01818 Encounter for other preprocedural examination: Secondary | ICD-10-CM | POA: Diagnosis not present

## 2022-10-18 DIAGNOSIS — E1122 Type 2 diabetes mellitus with diabetic chronic kidney disease: Secondary | ICD-10-CM | POA: Diagnosis not present

## 2022-10-18 DIAGNOSIS — E119 Type 2 diabetes mellitus without complications: Secondary | ICD-10-CM | POA: Diagnosis not present

## 2022-10-18 DIAGNOSIS — R918 Other nonspecific abnormal finding of lung field: Secondary | ICD-10-CM | POA: Diagnosis not present

## 2022-10-18 DIAGNOSIS — E1165 Type 2 diabetes mellitus with hyperglycemia: Secondary | ICD-10-CM | POA: Diagnosis not present

## 2022-10-18 DIAGNOSIS — I422 Other hypertrophic cardiomyopathy: Secondary | ICD-10-CM | POA: Diagnosis not present

## 2022-10-18 DIAGNOSIS — J9811 Atelectasis: Secondary | ICD-10-CM | POA: Diagnosis not present

## 2022-10-18 DIAGNOSIS — J948 Other specified pleural conditions: Secondary | ICD-10-CM | POA: Diagnosis not present

## 2022-10-18 DIAGNOSIS — Z4682 Encounter for fitting and adjustment of non-vascular catheter: Secondary | ICD-10-CM | POA: Diagnosis not present

## 2022-10-18 DIAGNOSIS — G8918 Other acute postprocedural pain: Secondary | ICD-10-CM | POA: Diagnosis not present

## 2022-10-18 DIAGNOSIS — Z4659 Encounter for fitting and adjustment of other gastrointestinal appliance and device: Secondary | ICD-10-CM | POA: Diagnosis not present

## 2022-10-18 DIAGNOSIS — N182 Chronic kidney disease, stage 2 (mild): Secondary | ICD-10-CM | POA: Diagnosis not present

## 2022-10-18 DIAGNOSIS — I050 Rheumatic mitral stenosis: Secondary | ICD-10-CM | POA: Diagnosis not present

## 2022-10-18 DIAGNOSIS — I9789 Other postprocedural complications and disorders of the circulatory system, not elsewhere classified: Secondary | ICD-10-CM | POA: Diagnosis not present

## 2022-10-18 DIAGNOSIS — I2510 Atherosclerotic heart disease of native coronary artery without angina pectoris: Secondary | ICD-10-CM | POA: Diagnosis not present

## 2022-10-18 DIAGNOSIS — Z7984 Long term (current) use of oral hypoglycemic drugs: Secondary | ICD-10-CM | POA: Diagnosis not present

## 2022-10-18 DIAGNOSIS — I4891 Unspecified atrial fibrillation: Secondary | ICD-10-CM | POA: Diagnosis not present

## 2022-10-18 DIAGNOSIS — I129 Hypertensive chronic kidney disease with stage 1 through stage 4 chronic kidney disease, or unspecified chronic kidney disease: Secondary | ICD-10-CM | POA: Diagnosis not present

## 2022-10-18 DIAGNOSIS — I3481 Nonrheumatic mitral (valve) annulus calcification: Secondary | ICD-10-CM | POA: Diagnosis not present

## 2022-10-18 DIAGNOSIS — I495 Sick sinus syndrome: Secondary | ICD-10-CM | POA: Diagnosis not present

## 2022-10-18 DIAGNOSIS — Z792 Long term (current) use of antibiotics: Secondary | ICD-10-CM | POA: Diagnosis not present

## 2022-10-18 DIAGNOSIS — G4700 Insomnia, unspecified: Secondary | ICD-10-CM | POA: Diagnosis not present

## 2022-10-18 DIAGNOSIS — E1159 Type 2 diabetes mellitus with other circulatory complications: Secondary | ICD-10-CM | POA: Diagnosis not present

## 2022-10-18 DIAGNOSIS — I080 Rheumatic disorders of both mitral and aortic valves: Secondary | ICD-10-CM | POA: Diagnosis not present

## 2022-10-18 DIAGNOSIS — Z952 Presence of prosthetic heart valve: Secondary | ICD-10-CM | POA: Diagnosis not present

## 2022-10-18 DIAGNOSIS — Z881 Allergy status to other antibiotic agents status: Secondary | ICD-10-CM | POA: Diagnosis not present

## 2022-10-18 DIAGNOSIS — I10 Essential (primary) hypertension: Secondary | ICD-10-CM | POA: Diagnosis not present

## 2022-10-18 DIAGNOSIS — J982 Interstitial emphysema: Secondary | ICD-10-CM | POA: Diagnosis not present

## 2022-10-18 DIAGNOSIS — I455 Other specified heart block: Secondary | ICD-10-CM | POA: Diagnosis not present

## 2022-10-18 DIAGNOSIS — I509 Heart failure, unspecified: Secondary | ICD-10-CM | POA: Diagnosis not present

## 2022-10-18 DIAGNOSIS — Z950 Presence of cardiac pacemaker: Secondary | ICD-10-CM | POA: Diagnosis not present

## 2022-10-18 DIAGNOSIS — Z87891 Personal history of nicotine dependence: Secondary | ICD-10-CM | POA: Diagnosis not present

## 2022-10-18 DIAGNOSIS — I350 Nonrheumatic aortic (valve) stenosis: Secondary | ICD-10-CM | POA: Diagnosis not present

## 2022-10-18 DIAGNOSIS — I442 Atrioventricular block, complete: Secondary | ICD-10-CM | POA: Diagnosis not present

## 2022-10-18 DIAGNOSIS — I421 Obstructive hypertrophic cardiomyopathy: Secondary | ICD-10-CM | POA: Diagnosis not present

## 2022-10-18 DIAGNOSIS — J90 Pleural effusion, not elsewhere classified: Secondary | ICD-10-CM | POA: Diagnosis not present

## 2022-10-18 DIAGNOSIS — D72829 Elevated white blood cell count, unspecified: Secondary | ICD-10-CM | POA: Diagnosis not present

## 2022-10-18 DIAGNOSIS — I517 Cardiomegaly: Secondary | ICD-10-CM | POA: Diagnosis not present

## 2022-10-18 DIAGNOSIS — J952 Acute pulmonary insufficiency following nonthoracic surgery: Secondary | ICD-10-CM | POA: Diagnosis not present

## 2022-10-18 DIAGNOSIS — I130 Hypertensive heart and chronic kidney disease with heart failure and stage 1 through stage 4 chronic kidney disease, or unspecified chronic kidney disease: Secondary | ICD-10-CM | POA: Diagnosis not present

## 2022-10-18 DIAGNOSIS — R5381 Other malaise: Secondary | ICD-10-CM | POA: Diagnosis not present

## 2022-10-18 DIAGNOSIS — Z96651 Presence of right artificial knee joint: Secondary | ICD-10-CM | POA: Diagnosis not present

## 2022-10-18 DIAGNOSIS — I342 Nonrheumatic mitral (valve) stenosis: Secondary | ICD-10-CM | POA: Diagnosis not present

## 2022-10-18 DIAGNOSIS — Z9889 Other specified postprocedural states: Secondary | ICD-10-CM | POA: Diagnosis not present

## 2022-10-18 DIAGNOSIS — D62 Acute posthemorrhagic anemia: Secondary | ICD-10-CM | POA: Diagnosis not present

## 2022-10-18 DIAGNOSIS — Z87440 Personal history of urinary (tract) infections: Secondary | ICD-10-CM | POA: Diagnosis not present

## 2022-10-18 DIAGNOSIS — I5032 Chronic diastolic (congestive) heart failure: Secondary | ICD-10-CM | POA: Diagnosis not present

## 2022-10-19 DIAGNOSIS — J811 Chronic pulmonary edema: Secondary | ICD-10-CM | POA: Diagnosis not present

## 2022-10-19 DIAGNOSIS — I422 Other hypertrophic cardiomyopathy: Secondary | ICD-10-CM | POA: Diagnosis not present

## 2022-10-19 DIAGNOSIS — I050 Rheumatic mitral stenosis: Secondary | ICD-10-CM | POA: Diagnosis not present

## 2022-10-19 DIAGNOSIS — Z4682 Encounter for fitting and adjustment of non-vascular catheter: Secondary | ICD-10-CM | POA: Diagnosis not present

## 2022-10-19 DIAGNOSIS — I509 Heart failure, unspecified: Secondary | ICD-10-CM | POA: Diagnosis not present

## 2022-10-19 DIAGNOSIS — Z952 Presence of prosthetic heart valve: Secondary | ICD-10-CM | POA: Diagnosis not present

## 2022-10-19 DIAGNOSIS — R918 Other nonspecific abnormal finding of lung field: Secondary | ICD-10-CM | POA: Diagnosis not present

## 2022-10-19 DIAGNOSIS — Z4659 Encounter for fitting and adjustment of other gastrointestinal appliance and device: Secondary | ICD-10-CM | POA: Diagnosis not present

## 2022-10-19 DIAGNOSIS — I350 Nonrheumatic aortic (valve) stenosis: Secondary | ICD-10-CM | POA: Diagnosis not present

## 2022-10-19 DIAGNOSIS — I080 Rheumatic disorders of both mitral and aortic valves: Secondary | ICD-10-CM | POA: Diagnosis not present

## 2022-10-19 DIAGNOSIS — Z452 Encounter for adjustment and management of vascular access device: Secondary | ICD-10-CM | POA: Diagnosis not present

## 2022-10-19 DIAGNOSIS — J9811 Atelectasis: Secondary | ICD-10-CM | POA: Diagnosis not present

## 2022-10-19 DIAGNOSIS — Z01818 Encounter for other preprocedural examination: Secondary | ICD-10-CM | POA: Diagnosis not present

## 2022-10-20 DIAGNOSIS — D62 Acute posthemorrhagic anemia: Secondary | ICD-10-CM | POA: Diagnosis not present

## 2022-10-20 DIAGNOSIS — Z4682 Encounter for fitting and adjustment of non-vascular catheter: Secondary | ICD-10-CM | POA: Diagnosis not present

## 2022-10-20 DIAGNOSIS — I422 Other hypertrophic cardiomyopathy: Secondary | ICD-10-CM | POA: Diagnosis not present

## 2022-10-20 DIAGNOSIS — G8918 Other acute postprocedural pain: Secondary | ICD-10-CM | POA: Diagnosis not present

## 2022-10-20 DIAGNOSIS — E1122 Type 2 diabetes mellitus with diabetic chronic kidney disease: Secondary | ICD-10-CM | POA: Diagnosis not present

## 2022-10-20 DIAGNOSIS — E1159 Type 2 diabetes mellitus with other circulatory complications: Secondary | ICD-10-CM | POA: Diagnosis not present

## 2022-10-20 DIAGNOSIS — J952 Acute pulmonary insufficiency following nonthoracic surgery: Secondary | ICD-10-CM | POA: Diagnosis not present

## 2022-10-20 DIAGNOSIS — N182 Chronic kidney disease, stage 2 (mild): Secondary | ICD-10-CM | POA: Diagnosis not present

## 2022-10-20 DIAGNOSIS — I130 Hypertensive heart and chronic kidney disease with heart failure and stage 1 through stage 4 chronic kidney disease, or unspecified chronic kidney disease: Secondary | ICD-10-CM | POA: Diagnosis not present

## 2022-10-20 DIAGNOSIS — E1165 Type 2 diabetes mellitus with hyperglycemia: Secondary | ICD-10-CM | POA: Diagnosis not present

## 2022-10-20 DIAGNOSIS — I350 Nonrheumatic aortic (valve) stenosis: Secondary | ICD-10-CM | POA: Diagnosis not present

## 2022-10-20 DIAGNOSIS — I5032 Chronic diastolic (congestive) heart failure: Secondary | ICD-10-CM | POA: Diagnosis not present

## 2022-10-20 DIAGNOSIS — Z452 Encounter for adjustment and management of vascular access device: Secondary | ICD-10-CM | POA: Diagnosis not present

## 2022-10-20 DIAGNOSIS — Z952 Presence of prosthetic heart valve: Secondary | ICD-10-CM | POA: Diagnosis not present

## 2022-10-21 DIAGNOSIS — J9811 Atelectasis: Secondary | ICD-10-CM | POA: Diagnosis not present

## 2022-10-21 DIAGNOSIS — I422 Other hypertrophic cardiomyopathy: Secondary | ICD-10-CM | POA: Diagnosis not present

## 2022-10-21 DIAGNOSIS — E1165 Type 2 diabetes mellitus with hyperglycemia: Secondary | ICD-10-CM | POA: Diagnosis not present

## 2022-10-21 DIAGNOSIS — J90 Pleural effusion, not elsewhere classified: Secondary | ICD-10-CM | POA: Diagnosis not present

## 2022-10-21 DIAGNOSIS — I9789 Other postprocedural complications and disorders of the circulatory system, not elsewhere classified: Secondary | ICD-10-CM | POA: Diagnosis not present

## 2022-10-21 DIAGNOSIS — I350 Nonrheumatic aortic (valve) stenosis: Secondary | ICD-10-CM | POA: Diagnosis not present

## 2022-10-21 DIAGNOSIS — I442 Atrioventricular block, complete: Secondary | ICD-10-CM | POA: Diagnosis not present

## 2022-10-21 DIAGNOSIS — Z452 Encounter for adjustment and management of vascular access device: Secondary | ICD-10-CM | POA: Diagnosis not present

## 2022-10-21 DIAGNOSIS — I5032 Chronic diastolic (congestive) heart failure: Secondary | ICD-10-CM | POA: Diagnosis not present

## 2022-10-21 DIAGNOSIS — I495 Sick sinus syndrome: Secondary | ICD-10-CM | POA: Diagnosis not present

## 2022-10-21 DIAGNOSIS — I3481 Nonrheumatic mitral (valve) annulus calcification: Secondary | ICD-10-CM | POA: Diagnosis not present

## 2022-10-21 DIAGNOSIS — Z4682 Encounter for fitting and adjustment of non-vascular catheter: Secondary | ICD-10-CM | POA: Diagnosis not present

## 2022-10-22 DIAGNOSIS — J9811 Atelectasis: Secondary | ICD-10-CM | POA: Diagnosis not present

## 2022-10-22 DIAGNOSIS — Z452 Encounter for adjustment and management of vascular access device: Secondary | ICD-10-CM | POA: Diagnosis not present

## 2022-10-22 DIAGNOSIS — J90 Pleural effusion, not elsewhere classified: Secondary | ICD-10-CM | POA: Diagnosis not present

## 2022-10-22 DIAGNOSIS — I422 Other hypertrophic cardiomyopathy: Secondary | ICD-10-CM | POA: Diagnosis not present

## 2022-10-22 DIAGNOSIS — E1165 Type 2 diabetes mellitus with hyperglycemia: Secondary | ICD-10-CM | POA: Diagnosis not present

## 2022-10-22 DIAGNOSIS — Z4682 Encounter for fitting and adjustment of non-vascular catheter: Secondary | ICD-10-CM | POA: Diagnosis not present

## 2022-10-23 DIAGNOSIS — J9811 Atelectasis: Secondary | ICD-10-CM | POA: Diagnosis not present

## 2022-10-23 DIAGNOSIS — Z952 Presence of prosthetic heart valve: Secondary | ICD-10-CM | POA: Diagnosis not present

## 2022-10-23 DIAGNOSIS — I422 Other hypertrophic cardiomyopathy: Secondary | ICD-10-CM | POA: Diagnosis not present

## 2022-10-23 DIAGNOSIS — E119 Type 2 diabetes mellitus without complications: Secondary | ICD-10-CM | POA: Diagnosis not present

## 2022-10-23 DIAGNOSIS — J90 Pleural effusion, not elsewhere classified: Secondary | ICD-10-CM | POA: Diagnosis not present

## 2022-10-24 DIAGNOSIS — J90 Pleural effusion, not elsewhere classified: Secondary | ICD-10-CM | POA: Diagnosis not present

## 2022-10-24 DIAGNOSIS — I422 Other hypertrophic cardiomyopathy: Secondary | ICD-10-CM | POA: Diagnosis not present

## 2022-10-24 DIAGNOSIS — I495 Sick sinus syndrome: Secondary | ICD-10-CM | POA: Diagnosis not present

## 2022-10-24 DIAGNOSIS — J9811 Atelectasis: Secondary | ICD-10-CM | POA: Diagnosis not present

## 2022-10-24 DIAGNOSIS — I5032 Chronic diastolic (congestive) heart failure: Secondary | ICD-10-CM | POA: Diagnosis not present

## 2022-10-24 DIAGNOSIS — E119 Type 2 diabetes mellitus without complications: Secondary | ICD-10-CM | POA: Diagnosis not present

## 2022-10-24 DIAGNOSIS — N182 Chronic kidney disease, stage 2 (mild): Secondary | ICD-10-CM | POA: Diagnosis not present

## 2022-10-24 DIAGNOSIS — I130 Hypertensive heart and chronic kidney disease with heart failure and stage 1 through stage 4 chronic kidney disease, or unspecified chronic kidney disease: Secondary | ICD-10-CM | POA: Diagnosis not present

## 2022-10-24 DIAGNOSIS — I4891 Unspecified atrial fibrillation: Secondary | ICD-10-CM | POA: Diagnosis not present

## 2022-10-24 DIAGNOSIS — Z952 Presence of prosthetic heart valve: Secondary | ICD-10-CM | POA: Diagnosis not present

## 2022-10-24 DIAGNOSIS — G8918 Other acute postprocedural pain: Secondary | ICD-10-CM | POA: Diagnosis not present

## 2022-10-25 DIAGNOSIS — E1165 Type 2 diabetes mellitus with hyperglycemia: Secondary | ICD-10-CM | POA: Diagnosis not present

## 2022-10-25 DIAGNOSIS — N182 Chronic kidney disease, stage 2 (mild): Secondary | ICD-10-CM | POA: Diagnosis not present

## 2022-10-25 DIAGNOSIS — Z952 Presence of prosthetic heart valve: Secondary | ICD-10-CM | POA: Diagnosis not present

## 2022-10-25 DIAGNOSIS — I5032 Chronic diastolic (congestive) heart failure: Secondary | ICD-10-CM | POA: Diagnosis not present

## 2022-10-25 DIAGNOSIS — J90 Pleural effusion, not elsewhere classified: Secondary | ICD-10-CM | POA: Diagnosis not present

## 2022-10-25 DIAGNOSIS — J9811 Atelectasis: Secondary | ICD-10-CM | POA: Diagnosis not present

## 2022-10-25 DIAGNOSIS — I517 Cardiomegaly: Secondary | ICD-10-CM | POA: Diagnosis not present

## 2022-10-25 DIAGNOSIS — Z9889 Other specified postprocedural states: Secondary | ICD-10-CM | POA: Diagnosis not present

## 2022-10-25 DIAGNOSIS — I130 Hypertensive heart and chronic kidney disease with heart failure and stage 1 through stage 4 chronic kidney disease, or unspecified chronic kidney disease: Secondary | ICD-10-CM | POA: Diagnosis not present

## 2022-10-25 DIAGNOSIS — E1159 Type 2 diabetes mellitus with other circulatory complications: Secondary | ICD-10-CM | POA: Diagnosis not present

## 2022-10-25 DIAGNOSIS — G8918 Other acute postprocedural pain: Secondary | ICD-10-CM | POA: Diagnosis not present

## 2022-10-25 DIAGNOSIS — E1122 Type 2 diabetes mellitus with diabetic chronic kidney disease: Secondary | ICD-10-CM | POA: Diagnosis not present

## 2022-10-25 DIAGNOSIS — I442 Atrioventricular block, complete: Secondary | ICD-10-CM | POA: Diagnosis not present

## 2022-10-26 DIAGNOSIS — Z452 Encounter for adjustment and management of vascular access device: Secondary | ICD-10-CM | POA: Diagnosis not present

## 2022-10-26 DIAGNOSIS — I4891 Unspecified atrial fibrillation: Secondary | ICD-10-CM | POA: Diagnosis not present

## 2022-10-26 DIAGNOSIS — E1165 Type 2 diabetes mellitus with hyperglycemia: Secondary | ICD-10-CM | POA: Diagnosis not present

## 2022-10-26 DIAGNOSIS — Z952 Presence of prosthetic heart valve: Secondary | ICD-10-CM | POA: Diagnosis not present

## 2022-10-26 DIAGNOSIS — I495 Sick sinus syndrome: Secondary | ICD-10-CM | POA: Diagnosis not present

## 2022-10-26 DIAGNOSIS — Z4682 Encounter for fitting and adjustment of non-vascular catheter: Secondary | ICD-10-CM | POA: Diagnosis not present

## 2022-10-26 DIAGNOSIS — I422 Other hypertrophic cardiomyopathy: Secondary | ICD-10-CM | POA: Diagnosis not present

## 2022-10-27 DIAGNOSIS — E1169 Type 2 diabetes mellitus with other specified complication: Secondary | ICD-10-CM | POA: Diagnosis not present

## 2022-10-27 DIAGNOSIS — I4891 Unspecified atrial fibrillation: Secondary | ICD-10-CM | POA: Diagnosis not present

## 2022-10-27 DIAGNOSIS — Z452 Encounter for adjustment and management of vascular access device: Secondary | ICD-10-CM | POA: Diagnosis not present

## 2022-10-27 DIAGNOSIS — I517 Cardiomegaly: Secondary | ICD-10-CM | POA: Diagnosis not present

## 2022-10-27 DIAGNOSIS — I495 Sick sinus syndrome: Secondary | ICD-10-CM | POA: Diagnosis not present

## 2022-10-27 DIAGNOSIS — J90 Pleural effusion, not elsewhere classified: Secondary | ICD-10-CM | POA: Diagnosis not present

## 2022-10-27 DIAGNOSIS — Z950 Presence of cardiac pacemaker: Secondary | ICD-10-CM | POA: Diagnosis not present

## 2022-10-27 DIAGNOSIS — I422 Other hypertrophic cardiomyopathy: Secondary | ICD-10-CM | POA: Diagnosis not present

## 2022-10-28 DIAGNOSIS — I422 Other hypertrophic cardiomyopathy: Secondary | ICD-10-CM | POA: Diagnosis not present

## 2022-10-28 DIAGNOSIS — J9811 Atelectasis: Secondary | ICD-10-CM | POA: Diagnosis not present

## 2022-10-28 DIAGNOSIS — I517 Cardiomegaly: Secondary | ICD-10-CM | POA: Diagnosis not present

## 2022-10-28 DIAGNOSIS — J948 Other specified pleural conditions: Secondary | ICD-10-CM | POA: Diagnosis not present

## 2022-10-28 DIAGNOSIS — Z952 Presence of prosthetic heart valve: Secondary | ICD-10-CM | POA: Diagnosis not present

## 2022-10-29 ENCOUNTER — Telehealth: Payer: Self-pay | Admitting: Cardiovascular Disease

## 2022-10-29 DIAGNOSIS — I422 Other hypertrophic cardiomyopathy: Secondary | ICD-10-CM | POA: Diagnosis not present

## 2022-10-29 DIAGNOSIS — E1169 Type 2 diabetes mellitus with other specified complication: Secondary | ICD-10-CM | POA: Diagnosis not present

## 2022-10-29 DIAGNOSIS — R918 Other nonspecific abnormal finding of lung field: Secondary | ICD-10-CM | POA: Diagnosis not present

## 2022-10-29 DIAGNOSIS — Z950 Presence of cardiac pacemaker: Secondary | ICD-10-CM | POA: Diagnosis not present

## 2022-10-29 DIAGNOSIS — Z952 Presence of prosthetic heart valve: Secondary | ICD-10-CM | POA: Diagnosis not present

## 2022-10-29 NOTE — Telephone Encounter (Signed)
Pt hasn't been seen by [facility] since 2022, she has followed with [facility] since then. Not sure that she is still following with [facility]. We also don't have any record of her taking warfarin to necessitate INR monitoring. Whoever is prescribing her warfarin should be managing her INRs.

## 2022-10-29 NOTE — Telephone Encounter (Signed)
Pt with probable discharge this weekend from [facility] after AVR,placed on coumadin  Tentative apt made for Coumadin management on Monday 11/03/22 at 10 am with Misty Stanley Reid,RN

## 2022-10-29 NOTE — Telephone Encounter (Signed)
Lowella Bandy from [facility] is requesting a callback at [phone]) or [phone])  regarding pt's INR being checked. Please advise

## 2022-10-30 DIAGNOSIS — J982 Interstitial emphysema: Secondary | ICD-10-CM | POA: Diagnosis not present

## 2022-10-30 DIAGNOSIS — Z952 Presence of prosthetic heart valve: Secondary | ICD-10-CM | POA: Diagnosis not present

## 2022-10-30 DIAGNOSIS — I422 Other hypertrophic cardiomyopathy: Secondary | ICD-10-CM | POA: Diagnosis not present

## 2022-10-31 DIAGNOSIS — J9811 Atelectasis: Secondary | ICD-10-CM | POA: Diagnosis not present

## 2022-10-31 DIAGNOSIS — Z952 Presence of prosthetic heart valve: Secondary | ICD-10-CM | POA: Diagnosis not present

## 2022-10-31 DIAGNOSIS — I422 Other hypertrophic cardiomyopathy: Secondary | ICD-10-CM | POA: Diagnosis not present

## 2022-10-31 DIAGNOSIS — I517 Cardiomegaly: Secondary | ICD-10-CM | POA: Diagnosis not present

## 2022-11-03 ENCOUNTER — Ambulatory Visit: Payer: Medicare Other | Attending: Internal Medicine | Admitting: *Deleted

## 2022-11-03 DIAGNOSIS — Z5181 Encounter for therapeutic drug level monitoring: Secondary | ICD-10-CM

## 2022-11-03 DIAGNOSIS — Z952 Presence of prosthetic heart valve: Secondary | ICD-10-CM

## 2022-11-03 LAB — POCT INR: INR: 1.8 — AB (ref 2.0–3.0)

## 2022-11-03 NOTE — Patient Instructions (Signed)
Has been on warfarin 3mg  daily since hospitalization at [facility]. Missed dose Sunday 7/7 Increase warfarin to 1 tablet daily except 1 1/2 tablets on Mondays, Wednesdays and Fridays. Recheck 11/08/22

## 2022-11-04 DIAGNOSIS — I119 Hypertensive heart disease without heart failure: Secondary | ICD-10-CM | POA: Diagnosis not present

## 2022-11-04 DIAGNOSIS — I4891 Unspecified atrial fibrillation: Secondary | ICD-10-CM | POA: Diagnosis not present

## 2022-11-04 DIAGNOSIS — Z952 Presence of prosthetic heart valve: Secondary | ICD-10-CM | POA: Diagnosis not present

## 2022-11-04 DIAGNOSIS — I5032 Chronic diastolic (congestive) heart failure: Secondary | ICD-10-CM | POA: Diagnosis not present

## 2022-11-04 DIAGNOSIS — Z48812 Encounter for surgical aftercare following surgery on the circulatory system: Secondary | ICD-10-CM | POA: Diagnosis not present

## 2022-11-10 DIAGNOSIS — Z7901 Long term (current) use of anticoagulants: Secondary | ICD-10-CM | POA: Diagnosis not present

## 2022-11-10 DIAGNOSIS — J9811 Atelectasis: Secondary | ICD-10-CM | POA: Diagnosis not present

## 2022-11-10 DIAGNOSIS — R0602 Shortness of breath: Secondary | ICD-10-CM | POA: Diagnosis not present

## 2022-11-10 DIAGNOSIS — Z952 Presence of prosthetic heart valve: Secondary | ICD-10-CM | POA: Diagnosis not present

## 2022-11-10 DIAGNOSIS — I4891 Unspecified atrial fibrillation: Secondary | ICD-10-CM | POA: Diagnosis not present

## 2022-11-10 DIAGNOSIS — I422 Other hypertrophic cardiomyopathy: Secondary | ICD-10-CM | POA: Diagnosis not present

## 2022-11-10 DIAGNOSIS — J90 Pleural effusion, not elsewhere classified: Secondary | ICD-10-CM | POA: Diagnosis not present

## 2022-11-10 DIAGNOSIS — Z48812 Encounter for surgical aftercare following surgery on the circulatory system: Secondary | ICD-10-CM | POA: Diagnosis not present

## 2022-11-10 DIAGNOSIS — Z79899 Other long term (current) drug therapy: Secondary | ICD-10-CM | POA: Diagnosis not present

## 2022-11-10 DIAGNOSIS — Z954 Presence of other heart-valve replacement: Secondary | ICD-10-CM | POA: Diagnosis not present

## 2022-11-10 DIAGNOSIS — R5383 Other fatigue: Secondary | ICD-10-CM | POA: Diagnosis not present

## 2022-11-11 DIAGNOSIS — Z952 Presence of prosthetic heart valve: Secondary | ICD-10-CM | POA: Diagnosis not present

## 2022-11-11 DIAGNOSIS — I447 Left bundle-branch block, unspecified: Secondary | ICD-10-CM | POA: Diagnosis not present

## 2022-11-11 DIAGNOSIS — I4891 Unspecified atrial fibrillation: Secondary | ICD-10-CM | POA: Diagnosis not present

## 2022-11-11 DIAGNOSIS — R9431 Abnormal electrocardiogram [ECG] [EKG]: Secondary | ICD-10-CM | POA: Diagnosis not present

## 2022-11-11 DIAGNOSIS — I480 Paroxysmal atrial fibrillation: Secondary | ICD-10-CM | POA: Diagnosis not present

## 2022-11-17 ENCOUNTER — Ambulatory Visit: Payer: Medicare Other | Attending: Cardiology | Admitting: *Deleted

## 2022-11-17 DIAGNOSIS — Z5181 Encounter for therapeutic drug level monitoring: Secondary | ICD-10-CM

## 2022-11-17 DIAGNOSIS — Z952 Presence of prosthetic heart valve: Secondary | ICD-10-CM

## 2022-11-17 LAB — POCT INR: INR: 4.3 — AB (ref 2.0–3.0)

## 2022-11-17 NOTE — Patient Instructions (Signed)
Was seen at [facility] on 7/18.  INR was 4.3  They told her to decrease warfarin to 1 tablet daily. INR still 4.3 today.  Hold warfarin tonight then decrease dose to 1 tablet daily except 1/2 tablet on Wednesdays and Saturdays Recheck 11/24/22

## 2022-11-22 ENCOUNTER — Ambulatory Visit: Payer: Medicare Other | Admitting: Cardiology

## 2022-11-23 DIAGNOSIS — Z953 Presence of xenogenic heart valve: Secondary | ICD-10-CM | POA: Diagnosis not present

## 2022-11-23 DIAGNOSIS — Z9889 Other specified postprocedural states: Secondary | ICD-10-CM | POA: Diagnosis not present

## 2022-11-23 DIAGNOSIS — I4729 Other ventricular tachycardia: Secondary | ICD-10-CM | POA: Diagnosis not present

## 2022-11-23 DIAGNOSIS — Z950 Presence of cardiac pacemaker: Secondary | ICD-10-CM | POA: Diagnosis not present

## 2022-11-23 DIAGNOSIS — I4891 Unspecified atrial fibrillation: Secondary | ICD-10-CM | POA: Diagnosis not present

## 2022-11-23 DIAGNOSIS — Z45018 Encounter for adjustment and management of other part of cardiac pacemaker: Secondary | ICD-10-CM | POA: Diagnosis not present

## 2022-11-23 DIAGNOSIS — Z7901 Long term (current) use of anticoagulants: Secondary | ICD-10-CM | POA: Diagnosis not present

## 2022-11-23 DIAGNOSIS — I4819 Other persistent atrial fibrillation: Secondary | ICD-10-CM | POA: Diagnosis not present

## 2022-11-23 DIAGNOSIS — Z954 Presence of other heart-valve replacement: Secondary | ICD-10-CM | POA: Diagnosis not present

## 2022-11-23 DIAGNOSIS — I10 Essential (primary) hypertension: Secondary | ICD-10-CM | POA: Diagnosis not present

## 2022-11-24 ENCOUNTER — Ambulatory Visit: Payer: Medicare Other | Attending: Cardiovascular Disease | Admitting: *Deleted

## 2022-11-24 DIAGNOSIS — Z5181 Encounter for therapeutic drug level monitoring: Secondary | ICD-10-CM | POA: Diagnosis not present

## 2022-11-24 DIAGNOSIS — Z952 Presence of prosthetic heart valve: Secondary | ICD-10-CM | POA: Diagnosis not present

## 2022-11-24 LAB — POCT INR: INR: 2.7 (ref 2.0–3.0)

## 2022-11-24 NOTE — Patient Instructions (Signed)
Continue warfarin 1 tablet daily except 1/2 tablet on Wednesdays and Saturdays Recheck 12/01/22 Saw EP at [facility] yesterday.  Pending DCCV.  Date TBD

## 2022-11-28 DIAGNOSIS — I350 Nonrheumatic aortic (valve) stenosis: Secondary | ICD-10-CM | POA: Diagnosis not present

## 2022-11-28 DIAGNOSIS — Z7901 Long term (current) use of anticoagulants: Secondary | ICD-10-CM | POA: Diagnosis not present

## 2022-11-28 DIAGNOSIS — I10 Essential (primary) hypertension: Secondary | ICD-10-CM | POA: Diagnosis not present

## 2022-11-28 DIAGNOSIS — Z9889 Other specified postprocedural states: Secondary | ICD-10-CM | POA: Diagnosis not present

## 2022-11-28 DIAGNOSIS — I4891 Unspecified atrial fibrillation: Secondary | ICD-10-CM | POA: Diagnosis not present

## 2022-11-28 DIAGNOSIS — Z950 Presence of cardiac pacemaker: Secondary | ICD-10-CM | POA: Diagnosis not present

## 2022-11-28 DIAGNOSIS — I459 Conduction disorder, unspecified: Secondary | ICD-10-CM | POA: Diagnosis not present

## 2022-11-28 DIAGNOSIS — I422 Other hypertrophic cardiomyopathy: Secondary | ICD-10-CM | POA: Diagnosis not present

## 2022-11-28 DIAGNOSIS — I5032 Chronic diastolic (congestive) heart failure: Secondary | ICD-10-CM | POA: Diagnosis not present

## 2022-12-01 ENCOUNTER — Ambulatory Visit: Payer: Medicare Other | Attending: Cardiovascular Disease | Admitting: *Deleted

## 2022-12-01 DIAGNOSIS — Z5181 Encounter for therapeutic drug level monitoring: Secondary | ICD-10-CM

## 2022-12-01 DIAGNOSIS — Z952 Presence of prosthetic heart valve: Secondary | ICD-10-CM | POA: Diagnosis not present

## 2022-12-01 LAB — POCT INR: INR: 2.4 (ref 2.0–3.0)

## 2022-12-01 NOTE — Patient Instructions (Signed)
Increase warfarin to 1 tablet daily except 1/2 tablet on Saturdays Recheck 12/01/22 Pending DCCV at [facility] on 12/07/22.

## 2022-12-07 DIAGNOSIS — Z7901 Long term (current) use of anticoagulants: Secondary | ICD-10-CM | POA: Diagnosis not present

## 2022-12-07 DIAGNOSIS — Z950 Presence of cardiac pacemaker: Secondary | ICD-10-CM | POA: Diagnosis not present

## 2022-12-07 DIAGNOSIS — I4891 Unspecified atrial fibrillation: Secondary | ICD-10-CM | POA: Diagnosis not present

## 2022-12-07 DIAGNOSIS — Z01810 Encounter for preprocedural cardiovascular examination: Secondary | ICD-10-CM | POA: Diagnosis not present

## 2022-12-07 DIAGNOSIS — I422 Other hypertrophic cardiomyopathy: Secondary | ICD-10-CM | POA: Diagnosis not present

## 2022-12-09 ENCOUNTER — Ambulatory Visit: Payer: Medicare Other | Attending: Cardiovascular Disease | Admitting: *Deleted

## 2022-12-09 DIAGNOSIS — Z952 Presence of prosthetic heart valve: Secondary | ICD-10-CM

## 2022-12-09 DIAGNOSIS — Z5181 Encounter for therapeutic drug level monitoring: Secondary | ICD-10-CM

## 2022-12-09 LAB — POCT INR: INR: 3.3 — AB (ref 2.0–3.0)

## 2022-12-09 NOTE — Patient Instructions (Signed)
Continue warfarin 1 tablet daily except 1/2 tablet on Saturdays Recheck in 2 wks Did not need DCCV at [facility].  Was in NSR

## 2022-12-23 ENCOUNTER — Ambulatory Visit: Payer: Medicare Other | Attending: Cardiovascular Disease | Admitting: *Deleted

## 2022-12-23 DIAGNOSIS — Z952 Presence of prosthetic heart valve: Secondary | ICD-10-CM

## 2022-12-23 DIAGNOSIS — Z5181 Encounter for therapeutic drug level monitoring: Secondary | ICD-10-CM | POA: Diagnosis not present

## 2022-12-23 LAB — POCT INR: POC INR: 3.8

## 2022-12-23 MED ORDER — WARFARIN SODIUM 3 MG PO TABS: ORAL_TABLET | ORAL | 0 refills | Status: DC

## 2022-12-23 NOTE — Patient Instructions (Signed)
Description   Hold warfarin today and then continue warfarin 1 tablet daily except 1/2 tablet on Saturdays Recheck in 2 wks Did not need DCCV at [facility].  Was in NSR

## 2023-01-13 ENCOUNTER — Ambulatory Visit: Payer: Medicare Other | Attending: Cardiovascular Disease

## 2023-01-13 DIAGNOSIS — Z952 Presence of prosthetic heart valve: Secondary | ICD-10-CM

## 2023-01-13 DIAGNOSIS — Z5181 Encounter for therapeutic drug level monitoring: Secondary | ICD-10-CM | POA: Diagnosis not present

## 2023-01-13 LAB — POCT INR: INR: 1.8 — AB (ref 2.0–3.0)

## 2023-01-13 NOTE — Patient Instructions (Signed)
Description   Take an extra 1/2 tablet tomorrow, then resume same dosage of Warfarin 1 tablet daily except 1/2 tablet on Saturdays Recheck in 2 wks Did not need DCCV at [facility].  Was in NSR

## 2023-01-27 ENCOUNTER — Ambulatory Visit: Payer: Medicare Other | Attending: Cardiovascular Disease | Admitting: *Deleted

## 2023-01-27 DIAGNOSIS — Z5181 Encounter for therapeutic drug level monitoring: Secondary | ICD-10-CM

## 2023-01-27 DIAGNOSIS — Z952 Presence of prosthetic heart valve: Secondary | ICD-10-CM

## 2023-01-27 LAB — POCT INR: INR: 2.1 (ref 2.0–3.0)

## 2023-01-27 NOTE — Patient Instructions (Signed)
Forgot warfarin last night.  Took it this morning. Take another 1 tablet tonight, then resume same dosage of Warfarin 1 tablet daily except 1/2 tablet on Saturdays Recheck in 2 wks Did not need DCCV at [facility].  Was in NSR

## 2023-01-31 ENCOUNTER — Emergency Department (HOSPITAL_COMMUNITY): Payer: Medicare Other

## 2023-01-31 ENCOUNTER — Other Ambulatory Visit: Payer: Self-pay

## 2023-01-31 ENCOUNTER — Inpatient Hospital Stay (HOSPITAL_COMMUNITY)
Admission: EM | Admit: 2023-01-31 | Discharge: 2023-02-02 | DRG: 309 | Disposition: A | Discharge status: Home / Self Care | Payer: Medicare Other | Attending: Family Medicine | Admitting: Family Medicine

## 2023-01-31 ENCOUNTER — Encounter (HOSPITAL_COMMUNITY): Payer: Self-pay | Admitting: *Deleted

## 2023-01-31 DIAGNOSIS — Z803 Family history of malignant neoplasm of breast: Secondary | ICD-10-CM

## 2023-01-31 DIAGNOSIS — I484 Atypical atrial flutter: Principal | ICD-10-CM | POA: Diagnosis present

## 2023-01-31 DIAGNOSIS — K219 Gastro-esophageal reflux disease without esophagitis: Secondary | ICD-10-CM | POA: Diagnosis not present

## 2023-01-31 DIAGNOSIS — Z952 Presence of prosthetic heart valve: Secondary | ICD-10-CM | POA: Diagnosis not present

## 2023-01-31 DIAGNOSIS — I130 Hypertensive heart and chronic kidney disease with heart failure and stage 1 through stage 4 chronic kidney disease, or unspecified chronic kidney disease: Secondary | ICD-10-CM | POA: Diagnosis not present

## 2023-01-31 DIAGNOSIS — N179 Acute kidney failure, unspecified: Secondary | ICD-10-CM | POA: Diagnosis not present

## 2023-01-31 DIAGNOSIS — Z800 Family history of malignant neoplasm of digestive organs: Secondary | ICD-10-CM

## 2023-01-31 DIAGNOSIS — Z79899 Other long term (current) drug therapy: Secondary | ICD-10-CM | POA: Diagnosis not present

## 2023-01-31 DIAGNOSIS — I421 Obstructive hypertrophic cardiomyopathy: Secondary | ICD-10-CM | POA: Diagnosis not present

## 2023-01-31 DIAGNOSIS — N1832 Chronic kidney disease, stage 3b: Secondary | ICD-10-CM | POA: Diagnosis not present

## 2023-01-31 DIAGNOSIS — I10 Essential (primary) hypertension: Secondary | ICD-10-CM | POA: Diagnosis present

## 2023-01-31 DIAGNOSIS — Z950 Presence of cardiac pacemaker: Secondary | ICD-10-CM | POA: Diagnosis not present

## 2023-01-31 DIAGNOSIS — Z7984 Long term (current) use of oral hypoglycemic drugs: Secondary | ICD-10-CM

## 2023-01-31 DIAGNOSIS — D72829 Elevated white blood cell count, unspecified: Secondary | ICD-10-CM | POA: Diagnosis not present

## 2023-01-31 DIAGNOSIS — I361 Nonrheumatic tricuspid (valve) insufficiency: Secondary | ICD-10-CM | POA: Diagnosis not present

## 2023-01-31 DIAGNOSIS — Z953 Presence of xenogenic heart valve: Secondary | ICD-10-CM

## 2023-01-31 DIAGNOSIS — E119 Type 2 diabetes mellitus without complications: Secondary | ICD-10-CM | POA: Diagnosis not present

## 2023-01-31 DIAGNOSIS — E876 Hypokalemia: Secondary | ICD-10-CM | POA: Diagnosis not present

## 2023-01-31 DIAGNOSIS — I480 Paroxysmal atrial fibrillation: Secondary | ICD-10-CM | POA: Diagnosis present

## 2023-01-31 DIAGNOSIS — I4892 Unspecified atrial flutter: Secondary | ICD-10-CM | POA: Insufficient documentation

## 2023-01-31 DIAGNOSIS — Z87891 Personal history of nicotine dependence: Secondary | ICD-10-CM | POA: Diagnosis not present

## 2023-01-31 DIAGNOSIS — I509 Heart failure, unspecified: Secondary | ICD-10-CM | POA: Diagnosis not present

## 2023-01-31 DIAGNOSIS — I340 Nonrheumatic mitral (valve) insufficiency: Secondary | ICD-10-CM | POA: Diagnosis not present

## 2023-01-31 DIAGNOSIS — E669 Obesity, unspecified: Secondary | ICD-10-CM | POA: Diagnosis present

## 2023-01-31 DIAGNOSIS — Z8249 Family history of ischemic heart disease and other diseases of the circulatory system: Secondary | ICD-10-CM | POA: Diagnosis not present

## 2023-01-31 DIAGNOSIS — Z66 Do not resuscitate: Secondary | ICD-10-CM | POA: Diagnosis not present

## 2023-01-31 DIAGNOSIS — R002 Palpitations: Secondary | ICD-10-CM | POA: Diagnosis not present

## 2023-01-31 DIAGNOSIS — Z7901 Long term (current) use of anticoagulants: Secondary | ICD-10-CM

## 2023-01-31 DIAGNOSIS — E1122 Type 2 diabetes mellitus with diabetic chronic kidney disease: Secondary | ICD-10-CM | POA: Diagnosis present

## 2023-01-31 DIAGNOSIS — I4891 Unspecified atrial fibrillation: Secondary | ICD-10-CM | POA: Diagnosis not present

## 2023-01-31 DIAGNOSIS — Z96651 Presence of right artificial knee joint: Secondary | ICD-10-CM | POA: Diagnosis not present

## 2023-01-31 DIAGNOSIS — Z6834 Body mass index (BMI) 34.0-34.9, adult: Secondary | ICD-10-CM

## 2023-01-31 DIAGNOSIS — R0602 Shortness of breath: Secondary | ICD-10-CM | POA: Diagnosis not present

## 2023-01-31 DIAGNOSIS — Z91148 Patient's other noncompliance with medication regimen for other reason: Secondary | ICD-10-CM

## 2023-01-31 HISTORY — DX: Paroxysmal atrial fibrillation: I48.0

## 2023-01-31 HISTORY — DX: Rheumatic mitral stenosis: I05.0

## 2023-01-31 HISTORY — DX: Type 2 diabetes mellitus without complications: E11.9

## 2023-01-31 HISTORY — DX: Obstructive hypertrophic cardiomyopathy: I42.1

## 2023-01-31 HISTORY — DX: Nonrheumatic aortic (valve) stenosis: I35.0

## 2023-01-31 HISTORY — DX: Conduction disorder, unspecified: I45.9

## 2023-01-31 LAB — CBC WITH DIFFERENTIAL/PLATELET
Abs Immature Granulocytes: 0.14 10*3/uL — ABNORMAL HIGH (ref 0.00–0.07)
Basophils Absolute: 0.1 10*3/uL (ref 0.0–0.1)
Basophils Relative: 1 %
Eosinophils Absolute: 0.1 10*3/uL (ref 0.0–0.5)
Eosinophils Relative: 1 %
HCT: 39.4 % (ref 36.0–46.0)
Hemoglobin: 12.4 g/dL (ref 12.0–15.0)
Immature Granulocytes: 1 %
Lymphocytes Relative: 18 %
Lymphs Abs: 2.5 10*3/uL (ref 0.7–4.0)
MCH: 29 pg (ref 26.0–34.0)
MCHC: 31.5 g/dL (ref 30.0–36.0)
MCV: 92.3 fL (ref 80.0–100.0)
Monocytes Absolute: 0.9 10*3/uL (ref 0.1–1.0)
Monocytes Relative: 6 %
Neutro Abs: 10.4 10*3/uL — ABNORMAL HIGH (ref 1.7–7.7)
Neutrophils Relative %: 73 %
Platelets: 395 10*3/uL (ref 150–400)
RBC: 4.27 MIL/uL (ref 3.87–5.11)
RDW: 14 % (ref 11.5–15.5)
WBC: 14.2 10*3/uL — ABNORMAL HIGH (ref 4.0–10.5)
nRBC: 0 % (ref 0.0–0.2)

## 2023-01-31 LAB — PROTIME-INR
INR: 2.2 — ABNORMAL HIGH (ref 0.8–1.2)
Prothrombin Time: 25.1 s — ABNORMAL HIGH (ref 11.4–15.2)

## 2023-01-31 LAB — HEMOGLOBIN A1C
Hgb A1c MFr Bld: 7.5 % — ABNORMAL HIGH (ref 4.8–5.6)
Mean Plasma Glucose: 168.55 mg/dL

## 2023-01-31 LAB — POTASSIUM: Potassium: 4.7 mmol/L (ref 3.5–5.1)

## 2023-01-31 LAB — BASIC METABOLIC PANEL
Anion gap: 14 (ref 5–15)
BUN: 37 mg/dL — ABNORMAL HIGH (ref 8–23)
CO2: 18 mmol/L — ABNORMAL LOW (ref 22–32)
Calcium: 8.9 mg/dL (ref 8.9–10.3)
Chloride: 103 mmol/L (ref 98–111)
Creatinine, Ser: 1.53 mg/dL — ABNORMAL HIGH (ref 0.44–1.00)
GFR, Estimated: 35 mL/min — ABNORMAL LOW (ref >60)
Glucose, Bld: 224 mg/dL — ABNORMAL HIGH (ref 70–99)
Potassium: 4.1 mmol/L (ref 3.5–5.1)
Sodium: 135 mmol/L (ref 135–145)

## 2023-01-31 LAB — GLUCOSE, CAPILLARY
Glucose-Capillary: 172 mg/dL — ABNORMAL HIGH (ref 70–99)
Glucose-Capillary: 158 mg/dL — ABNORMAL HIGH (ref 70–99)

## 2023-01-31 LAB — MRSA NEXT GEN BY PCR, NASAL: MRSA by PCR Next Gen: NOT DETECTED

## 2023-01-31 LAB — MAGNESIUM: Magnesium: 1.3 mg/dL — ABNORMAL LOW (ref 1.7–2.4)

## 2023-01-31 LAB — TSH: TSH: 2.386 u[IU]/mL (ref 0.350–4.500)

## 2023-01-31 MED ORDER — CHLORHEXIDINE GLUCONATE CLOTH 2 % EX PADS
6.0000 | MEDICATED_PAD | Freq: Every day | CUTANEOUS | Status: DC
  Administered 2023-02-01 – 2023-02-02 (×2): 6 via TOPICAL

## 2023-01-31 MED ORDER — ACETAMINOPHEN 650 MG RE SUPP: 650.0000 mg | Freq: Four times a day (QID) | RECTAL | Status: DC | PRN

## 2023-01-31 MED ORDER — METOPROLOL SUCCINATE ER 25 MG PO TB24
25.0000 mg | ORAL_TABLET | Freq: Every day | ORAL | Status: DC
  Administered 2023-01-31 – 2023-02-01 (×2): 25 mg via ORAL
  Filled 2023-01-31 (×2): qty 1

## 2023-01-31 MED ORDER — WARFARIN - PHARMACIST DOSING INPATIENT: Freq: Every day | Status: DC

## 2023-01-31 MED ORDER — ACETAMINOPHEN 325 MG PO TABS: 650.0000 mg | ORAL_TABLET | Freq: Four times a day (QID) | ORAL | Status: DC | PRN

## 2023-01-31 MED ORDER — ONDANSETRON HCL 4 MG/2ML IJ SOLN: 4.0000 mg | Freq: Four times a day (QID) | INTRAMUSCULAR | Status: DC | PRN

## 2023-01-31 MED ORDER — INFLUENZA VAC A&B SURF ANT ADJ 0.5 ML IM SUSY: 0.5000 mL | PREFILLED_SYRINGE | INTRAMUSCULAR | Status: DC

## 2023-01-31 MED ORDER — OXYCODONE HCL 5 MG PO TABS: 5.0000 mg | ORAL_TABLET | ORAL | Status: DC | PRN

## 2023-01-31 MED ORDER — AMIODARONE LOAD VIA INFUSION
150.0000 mg | Freq: Once | INTRAVENOUS | Status: AC
  Administered 2023-01-31: 150 mg via INTRAVENOUS
  Filled 2023-01-31: qty 83.34

## 2023-01-31 MED ORDER — INSULIN ASPART 100 UNIT/ML IJ SOLN: 0.0000 [IU] | Freq: Every day | INTRAMUSCULAR | Status: DC

## 2023-01-31 MED ORDER — PNEUMOCOCCAL 20-VAL CONJ VACC 0.5 ML IM SUSY: 0.5000 mL | PREFILLED_SYRINGE | INTRAMUSCULAR | Status: DC

## 2023-01-31 MED ORDER — ONDANSETRON HCL 4 MG PO TABS: 4.0000 mg | ORAL_TABLET | Freq: Four times a day (QID) | ORAL | Status: DC | PRN

## 2023-01-31 MED ORDER — AMIODARONE HCL IN DEXTROSE 360-4.14 MG/200ML-% IV SOLN
60.0000 mg/h | INTRAVENOUS | Status: AC
  Administered 2023-01-31: 60 mg/h via INTRAVENOUS
  Filled 2023-01-31 (×2): qty 200

## 2023-01-31 MED ORDER — AMIODARONE HCL IN DEXTROSE 360-4.14 MG/200ML-% IV SOLN
30.0000 mg/h | INTRAVENOUS | Status: DC
  Administered 2023-01-31 – 2023-02-02 (×3): 30 mg/h via INTRAVENOUS
  Filled 2023-01-31 (×3): qty 200

## 2023-01-31 MED ORDER — LABETALOL HCL 5 MG/ML IV SOLN
10.0000 mg | INTRAVENOUS | Status: DC | PRN
  Administered 2023-02-01: 10 mg via INTRAVENOUS
  Filled 2023-01-31: qty 4

## 2023-01-31 MED ORDER — ZOLPIDEM TARTRATE 5 MG PO TABS
5.0000 mg | ORAL_TABLET | Freq: Every evening | ORAL | Status: DC | PRN
  Administered 2023-02-01 (×2): 5 mg via ORAL
  Filled 2023-01-31 (×2): qty 1

## 2023-01-31 MED ORDER — WARFARIN SODIUM 5 MG PO TABS
5.0000 mg | ORAL_TABLET | Freq: Once | ORAL | Status: AC
  Administered 2023-01-31: 5 mg via ORAL
  Filled 2023-01-31: qty 1

## 2023-01-31 MED ORDER — INSULIN ASPART 100 UNIT/ML IJ SOLN
0.0000 [IU] | Freq: Three times a day (TID) | INTRAMUSCULAR | Status: DC
  Administered 2023-02-01: 1 [IU] via SUBCUTANEOUS
  Administered 2023-02-01 – 2023-02-02 (×3): 2 [IU] via SUBCUTANEOUS
  Administered 2023-02-02: 1 [IU] via SUBCUTANEOUS

## 2023-01-31 MED ORDER — PANTOPRAZOLE SODIUM 40 MG PO TBEC
40.0000 mg | DELAYED_RELEASE_TABLET | Freq: Every day | ORAL | Status: DC
  Administered 2023-01-31 – 2023-02-01 (×2): 40 mg via ORAL
  Filled 2023-01-31 (×2): qty 1

## 2023-01-31 MED ORDER — METHOCARBAMOL 500 MG PO TABS: 500.0000 mg | ORAL_TABLET | Freq: Four times a day (QID) | ORAL | Status: DC | PRN

## 2023-01-31 MED ORDER — EZETIMIBE 10 MG PO TABS
10.0000 mg | ORAL_TABLET | Freq: Every day | ORAL | Status: DC
  Administered 2023-01-31 – 2023-02-01 (×2): 10 mg via ORAL
  Filled 2023-01-31 (×2): qty 1

## 2023-01-31 NOTE — ED Notes (Signed)
Public affairs consultant. Report sent, pending response.

## 2023-01-31 NOTE — H&P (Addendum)
History and Physical    [name] QIH:[id number] DOB: 02-16-1951 DOA: 01/31/2023  PCP: Assunta Found, MD   Patient coming from: Home  Chief Complaint: Palpitations/dyspnea  HPI: [name] is a 72 y.o. female with medical history significant for HOCM, aortic stenosis and mitral stenosis status post bioprosthetic AVR and mechanical MVR at [facility], heart block with PPM, type 2 diabetes, hypertension, and recurrent atrial fibrillation requiring cardioversion who presented to the ED with complaints of worsening palpitations since last Thursday that was associated with exertional shortness of breath as well as weakness.  She denies any chest pain or pressure or edema to her lower extremities, but does claim to have some worsening weakness to her lower extremities.  She is followed by cardiology at [facility] and was supposed to have a cardioversion in August, however she was in sinus rhythm when she presented for the procedure.  She unfortunately ran out of her medications since August to include amiodarone, Toprol-XL, and verapamil.  She has been taking her Coumadin, however.     ED Course: EKG with atrial fibrillation with RVR noted and patient noted to be tachypneic as well.  Leukocytosis of 14,000 noted and BUN 37 and creatinine 1.53 with magnesium 1.3.  Chest x-ray with no acute findings noted.  EDP discussed with cardiology who recommends amiodarone for now.  Review of Systems: Reviewed as noted above, otherwise negative.  Past Medical History:  Diagnosis Date   Aortic stenosis    21 mm Inspiris Edwards AVR June 2024 - [facility]   Arthritis    Cervical incompetence    GERD (gastroesophageal reflux disease)    Hay fever    Heart block    [employer] DCPPM on 10/26/2022 with Dr. Zachery Conch - [facility]   Hemochromatosis    HOCM (hypertrophic obstructive cardiomyopathy) [facility])    Septal myomectomy June 2024 - [facility]   Hypertension    Migraines    Mitral stenosis    25 mm Regent mechanical MVR June  2024 - [facility]   Paroxysmal atrial fibrillation (HCC)    Sciatica    Type 2 diabetes mellitus (HCC)     Past Surgical History:  Procedure Laterality Date   CARDIAC SURGERY     CARPAL TUNNEL RELEASE Right    COLONOSCOPY N/A 08/01/2014   Procedure: COLONOSCOPY;  Surgeon: Malissa Hippo, MD;  Location: [facility]O SUITE;  Service: Endoscopy;  Laterality: N/A;  900 -- moved to 10:00 - Ann notified pt   ELBOW SURGERY     PACEMAKER INSERTION     June 2024   RIGHT/LEFT HEART CATH AND CORONARY ANGIOGRAPHY N/A 07/28/2020   Procedure: RIGHT/LEFT HEART CATH AND CORONARY ANGIOGRAPHY;  Surgeon: Tonny Bollman, MD;  Location: [facility] INVASIVE CV LAB;  Service: Cardiovascular;  Laterality: N/A;   ROTATOR CUFF REPAIR Right 2012   TONSILLECTOMY     TOTAL KNEE ARTHROPLASTY Right 10/13/2021   Procedure: TOTAL KNEE ARTHROPLASTY;  Surgeon: Durene Romans, MD;  Location: [facility] ORS;  Service: Orthopedics;  Laterality: Right;   TUBAL LIGATION     WISDOM TOOTH EXTRACTION       reports that she quit smoking about 53 years ago. Her smoking use included cigarettes. She started smoking about 60 years ago. She has a 0.7 pack-year smoking history. She has never used smokeless tobacco. She reports that she does not currently use alcohol. She reports that she does not use drugs.  No Known Allergies  Family History  Problem Relation Age of Onset   Heart  attack Paternal Grandfather    Migraines Maternal Grandmother    Heart attack Maternal Grandfather    Heart attack Father    Heart murmur Father    Cancer Mother        pancreatic cancer   Heart murmur Brother    Breast cancer Maternal Aunt     Prior to Admission medications   Medication Sig Start Date End Date Taking? Authorizing Provider  acetaminophen (TYLENOL) 325 MG tablet Take 3 tablets (975 mg total) by mouth every 6 (six) hours as needed for mild pain (pain score 1-3 or temp > 100.5). 10/14/21   Cassandria Anger, PA-C  docusate sodium (COLACE) 100 MG capsule  Take 1 capsule (100 mg total) by mouth 2 (two) times daily. 10/14/21   Cassandria Anger, PA-C  ezetimibe (ZETIA) 10 MG tablet Take 10 mg by mouth daily. 12/03/20   [provider]  fluticasone (FLONASE) 50 MCG/ACT nasal spray Place 1-2 sprays into both nostrils as needed for allergies or rhinitis.     [provider]  hydrochlorothiazide (HYDRODIURIL) 25 MG tablet Take 1 tablet (25 mg total) by mouth daily as needed (Edema). 09/14/21 09/09/22  Wendall Stade, MD  hydrocortisone cream 1 % Apply 1 application. topically daily as needed for itching.    [provider]  losartan (COZAAR) 50 MG tablet Take 50 mg by mouth daily. 11/06/20 11/06/21  [provider]  metFORMIN (GLUCOPHAGE) 1000 MG tablet Take 1,000 mg by mouth 2 (two) times daily. 06/08/20   [provider]  methocarbamol (ROBAXIN) 500 MG tablet Take 1 tablet (500 mg total) by mouth every 6 (six) hours as needed for muscle spasms. 10/14/21   Cassandria Anger, PA-C  metoprolol succinate (TOPROL-XL) 50 MG 24 hr tablet Take 25 mg by mouth at bedtime. 11/06/20   [provider]  omeprazole (PRILOSEC) 20 MG capsule Take 20 mg by mouth daily.  04/18/14   [provider]  oxyCODONE (OXY IR/ROXICODONE) 5 MG immediate release tablet Take 1-2 tablets (5-10 mg total) by mouth every 4 (four) hours as needed for severe pain. Start with 1 tablet by mouth every 4 hours as needed. Take 2 only for severe pain 10/14/21   Cassandria Anger, PA-C  polyethylene glycol (MIRALAX / GLYCOLAX) 17 g packet Take 17 g by mouth daily as needed for mild constipation. 10/14/21   Cassandria Anger, PA-C  verapamil (CALAN-SR) 240 MG CR tablet Take 1.5 tablets (360 mg total) by mouth at bedtime. 10/19/21   Wendall Stade, MD  warfarin (COUMADIN) 3 MG tablet Take 1/2 a tablet to 1 tablet by mouth daily as directed by the coumadin clinic. 12/23/22   Wendall Stade, MD  zolpidem (AMBIEN) 10 MG tablet Take 5 mg by mouth at  bedtime as needed for sleep. 06/12/14   [provider]    Physical Exam: Vitals:   01/31/23 1315 01/31/23 1457 01/31/23 1502 01/31/23 1504  BP: 117/87 118/87 (!) 151/68   Pulse: (!) 126 (!) 129 (!) 129   Resp: (!) 30  (!) 23   Temp:    97.8 F (36.6 C)  TempSrc:    Oral  SpO2: 95%  93%   Weight:      Height:        Constitutional: NAD, calm, comfortable Vitals:   01/31/23 1315 01/31/23 1457 01/31/23 1502 01/31/23 1504  BP: 117/87 118/87 (!) 151/68   Pulse: (!) 126 (!) 129 (!) 129   Resp: Marland Kitchen)  30  (!) 23   Temp:    97.8 F (36.6 C)  TempSrc:    Oral  SpO2: 95%  93%   Weight:      Height:       Eyes: lids and conjunctivae normal Neck: normal, supple Respiratory: clear to auscultation bilaterally. Normal respiratory effort. No accessory muscle use.  Cardiovascular: Irregular and tachycardic, no murmurs. Abdomen: no tenderness, no distention. Bowel sounds positive.  Musculoskeletal:  No edema. Skin: no rashes, lesions, ulcers.  Psychiatric: Flat affect  Labs on Admission: I have personally reviewed following labs and imaging studies  CBC: Recent Labs  Lab 01/31/23 1230  WBC 14.2*  NEUTROABS 10.4*  HGB 12.4  HCT 39.4  MCV 92.3  PLT 395   Basic Metabolic Panel: Recent Labs  Lab 01/31/23 1230  NA 135  K 4.1  CL 103  CO2 18*  GLUCOSE 224*  BUN 37*  CREATININE 1.53*  CALCIUM 8.9  MG 1.3*   GFR: Estimated Creatinine Clearance: 40.8 mL/min (A) (by C-G formula based on SCr of 1.53 mg/dL (H)). Liver Function Tests: No results for input(s): "AST", "ALT", "ALKPHOS", "BILITOT", "PROT", "ALBUMIN" in the last 168 hours. No results for input(s): "LIPASE", "AMYLASE" in the last 168 hours. No results for input(s): "AMMONIA" in the last 168 hours. Coagulation Profile: Recent Labs  Lab 01/27/23 1342 01/31/23 1231  INR 2.1 2.2*   Cardiac Enzymes: No results for input(s): "CKTOTAL", "CKMB", "CKMBINDEX", "TROPONINI" in the last 168 hours. BNP (last 3  results) No results for input(s): "PROBNP" in the last 8760 hours. HbA1C: No results for input(s): "HGBA1C" in the last 72 hours. CBG: No results for input(s): "GLUCAP" in the last 168 hours. Lipid Profile: No results for input(s): "CHOL", "HDL", "LDLCALC", "TRIG", "CHOLHDL", "LDLDIRECT" in the last 72 hours. Thyroid Function Tests: No results for input(s): "TSH", "T4TOTAL", "FREET4", "T3FREE", "THYROIDAB" in the last 72 hours. Anemia Panel: No results for input(s): "VITAMINB12", "FOLATE", "FERRITIN", "TIBC", "IRON", "RETICCTPCT" in the last 72 hours. Urine analysis:    Component Value Date/Time   BILIRUBINUR n 04/26/2019 1614   PROTEINUR Negative 04/26/2019 1614   UROBILINOGEN 0.2 04/26/2019 1614   NITRITE n 04/26/2019 1614   LEUKOCYTESUR Moderate (2+) (A) 04/26/2019 1614    Radiological Exams on Admission: DG Chest Port 1 View  Result Date: 01/31/2023 CLINICAL DATA:  Palpitations.  Shortness of breath. EXAM: PORTABLE CHEST 1 VIEW COMPARISON:  February 09, 2011. FINDINGS: Stable cardiomediastinal silhouette. Sternotomy wires are noted. Left-sided pacemaker is in good position. Both lungs are clear. The visualized skeletal structures are unremarkable. IMPRESSION: No active disease. Electronically Signed   By: Lupita Raider M.D.   On: 01/31/2023 14:10    EKG: Independently reviewed.  Atrial fibrillation with RVR.  Assessment/Plan Principal Problem:   Atrial fibrillation with RVR (HCC) Active Problems:   Essential hypertension   BMI 34.0-34.9,adult   Mitral valve replaced   S/P AVR (aortic valve replacement)    Atrial fibrillation with RVR -Continue Coumadin per pharmacy, currently therapeutic -Toprol-XL 25 mg daily as well as IV amiodarone -Might require TEE guided cardioversion in the next 24-48 hours, will plan to keep n.p.o. after midnight -Appreciate further cardiology recommendations  Mitral/aortic valve replacement -Status post valve replacement at [facility]  09/2022 -Continue Coumadin as above  History of nonfamilial HOCM  -Status post septal myomectomy at [facility] 09/2022 -Postoperative symptomatic bradycardia status post pacemaker placement  Essential hypertension -Continue on Toprol-XL while on amiodarone drip for now -Labetalol will be ordered as  needed  Type 2 diabetes -Maintain on SSI -Hold home oral agents -Carb modified diet  CKD stage IIIb -Previously noted to have creatinine 1.7  Obesity -BMI 34.97   DVT prophylaxis: Warfarin Code Status: DNR Family Communication: None at bedside Disposition Plan:Admit for arrhythmia Consults called:Cardiology Admission status: Inpatient, SDU  Severity of Illness: The appropriate patient status for this patient is INPATIENT. Inpatient status is judged to be reasonable and necessary in order to provide the required intensity of service to ensure the patient's safety. The patient's presenting symptoms, physical exam findings, and initial radiographic and laboratory data in the context of their chronic comorbidities is felt to place them at high risk for further clinical deterioration. Furthermore, it is not anticipated that the patient will be medically stable for discharge from the hospital within 2 midnights of admission.   * I certify that at the point of admission it is my clinical judgment that the patient will require inpatient hospital care spanning beyond 2 midnights from the point of admission due to high intensity of service, high risk for further deterioration and high frequency of surveillance required.*   Taeja Debellis D Ayra Hodgdon DO [facility]  If 7PM-7AM, please contact night-coverage [email or web address]  01/31/2023, 3:13 PM

## 2023-01-31 NOTE — ED Notes (Signed)
Interrogation report/ fax received. Given to EDP at [facility].

## 2023-01-31 NOTE — ED Notes (Signed)
Pt needs refill on medications since discharge from [facility].

## 2023-01-31 NOTE — ED Triage Notes (Signed)
Pt with palpitations since Thursday. Pt states with hx of Afib. + SOB, denies any CP.

## 2023-01-31 NOTE — ED Notes (Signed)
Pt alert, NAD, calm, interactive, resting comfortably on monitor.

## 2023-01-31 NOTE — ED Notes (Signed)
Cardiologist at bedside.  

## 2023-01-31 NOTE — Progress Notes (Signed)
PHARMACY - ANTICOAGULATION CONSULT NOTE  Pharmacy Consult for warfarin Indication: atrial fibrillation, s/p mitral and aortic valve replacement  No Known Allergies  Patient Measurements: Height: 5\' 8"  (172.7 cm) Weight: 104.3 kg (230 lb) IBW/kg (Calculated) : 63.9  Vital Signs: Temp: 97.8 F (36.6 C) (10/07 1504) Temp Source: Oral (10/07 1504) BP: 134/95 (10/07 1554) Pulse Rate: 120 (10/07 1554)  Labs: Recent Labs    01/31/23 1230 01/31/23 1231  HGB 12.4  --   HCT 39.4  --   PLT 395  --   LABPROT  --  25.1*  INR  --  2.2*  CREATININE 1.53*  --     Estimated Creatinine Clearance: 40.8 mL/min (A) (by C-G formula based on SCr of 1.53 mg/dL (H)).   Medical History: Past Medical History:  Diagnosis Date   Aortic stenosis    21 mm Inspiris Edwards AVR June 2024 - [facility]   Arthritis    Cervical incompetence    GERD (gastroesophageal reflux disease)    Hay fever    Heart block    [employer] DCPPM on 10/26/2022 with Dr. Zachery Conch - [facility]   Hemochromatosis    HOCM (hypertrophic obstructive cardiomyopathy) [facility])    Septal myomectomy June 2024 - [facility]   Hypertension    Migraines    Mitral stenosis    25 mm [employer] mechanical MVR June 2024 - [facility]   Paroxysmal atrial fibrillation (HCC)    Sciatica    Type 2 diabetes mellitus [facility])     Assessment: 72 year old female with recent valve replacement at [facility] presents to [facility] after running out of meds and having palpitations since last week. Patient's INR was slightly below goal at 2.2 (patient did not run out of warfarin). CBC appears normal.   Goal of Therapy:  INR goal 2.5-3.5 Monitor platelets by anticoagulation protocol: Yes   Plan:  Warfarin 5mg  tonight Daily INR  Sheppard Coil PharmD., BCPS Clinical Pharmacist 01/31/2023 4:23 PM

## 2023-01-31 NOTE — ED Provider Notes (Signed)
[name]   CSN: [id number] Arrival date & time: 01/31/23  1205     History  Chief Complaint  Patient presents with   Palpitations    [name] is a 72 y.o. female with PMH as listed below who presents with palpitations, dyspnea on exertion, shortness of breath that started on Thursday.  Consistent with her prior episodes of atrial fibrillation with RVR.  Patient does have extensive cardiac history. Per [facility] cardiology Barrera 11/23/22:  [name] is a 72 y.o. 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 Her procedure was complicated by post op bradycardia. Epicardial wires were placed and ultimately she received a [employer] DCPPM on 10/26/2022 with Dr. Zachery Conch. She was cardioverted X 2 before pacemaker placement at 200J and 360J for atrial fibrillation. She was started on Amio load in the hospital and transitioned to oral amiodarone on 11/01/2022. She presented to her outpatient CT surgery follow-up on 11/10/22 and was found to be in AF with RVR. TEE/DCCV on 11/11/2022 which was initially successful. She is anticoagulated with warfarin and gets her INR checked weekly.   She presents to clinic today after we received device alerts that she was having AF with RVR recurrence starting on 11/18/22. Heart rates were between 140-200bpm. She was going in and out of AF until 11/22/22 and has been in AF consistently via her device interrogation. Ms. Vicknair reports that she has been recovering well from her pacemaker placement and myectomy with MVR and AVR repair. She has noticed that her atrial fibrillation has returned which has caused an increase in SOB, fatigue, and chaotic heart  beats in her chest, especially at night. She denies chest pain, chest pressure, syncope or near syncope. Has noticed swelling in her lower extremities post hospitalization, which is improving. Taking an extra dose of lasix intermittently. She is anticoagulated with warfarin.   Will order another DCCV at today's visit. Depending on timing, she may need TEE as well. HR is quite elevated in RVR today, so will order Toprol 25mg  BID for rate control. If patient has refractory AF after amio load completed, will discuss further AF management with Dr. Read Drivers. Dr. Wilford Grist did try burst pacing through her device to terminate AF, but we were not successful.     Patient reports she has been out of any of her medicines including potassium, metoprolol, verapamil, HCTZ.  She states the amiodarone sounds familiar but is not sure if she was supposed to be taking it.  She has a medication list at her bedside medications she is supposed to be taking amiodarone is not on that list, however it is on her medication list from her most recent cardiology visit in July.  She still does have her warfarin and her losartan, but she is not sure the extent of the other ones she has been out of.  She states that she has been unable to pick up her medications slowly one by one from the pharmacy, states that she was unable to refill them or the pharmacy stated there was not a physician on record and she could not get the refills.  She states that after she was seen in the clinic in July and they  had her scheduled for an outpatient DCCV, she self converted.  I am not able to find documentation of this however.  Patient denies any fever/chills, cough, chest pain, leg swelling, viral upper respiratory symptoms, sick contacts.   Past Medical History:  Diagnosis Date   Aortic stenosis    21 mm Inspiris Edwards AVR June 2024 - [facility]   Arthritis    Cervical incompetence    GERD (gastroesophageal reflux disease)    Hay fever    Heart  block    [employer] DCPPM on 10/26/2022 with Dr. Zachery Conch - [facility]   Hemochromatosis    HOCM (hypertrophic obstructive cardiomyopathy) [facility])    Septal myomectomy June 2024 - [facility]   Hypertension    Migraines    Mitral stenosis    25 mm Regent mechanical MVR June 2024 - [facility]   Paroxysmal atrial fibrillation (HCC)    Sciatica    Type 2 diabetes mellitus (HCC)        Home Medications Prior to Admission medications   Medication Sig Start Date End Date Taking? Authorizing Provider  acetaminophen (TYLENOL) 325 MG tablet Take 3 tablets (975 mg total) by mouth every 6 (six) hours as needed for mild pain (pain score 1-3 or temp > 100.5). 10/14/21   Cassandria Anger, PA-C  docusate sodium (COLACE) 100 MG capsule Take 1 capsule (100 mg total) by mouth 2 (two) times daily. 10/14/21   Cassandria Anger, PA-C  ezetimibe (ZETIA) 10 MG tablet Take 10 mg by mouth daily. 12/03/20   [provider]  fluticasone (FLONASE) 50 MCG/ACT nasal spray Place 1-2 sprays into both nostrils as needed for allergies or rhinitis.     [provider]  hydrochlorothiazide (HYDRODIURIL) 25 MG tablet Take 1 tablet (25 mg total) by mouth daily as needed (Edema). 09/14/21 09/09/22  Wendall Stade, MD  hydrocortisone cream 1 % Apply 1 application. topically daily as needed for itching.    [provider]  losartan (COZAAR) 50 MG tablet Take 50 mg by mouth daily. 11/06/20 11/06/21  [provider]  metFORMIN (GLUCOPHAGE) 1000 MG tablet Take 1,000 mg by mouth 2 (two) times daily. 06/08/20   [provider]  methocarbamol (ROBAXIN) 500 MG tablet Take 1 tablet (500 mg total) by mouth every 6 (six) hours as needed for muscle spasms. 10/14/21   Cassandria Anger, PA-C  metoprolol succinate (TOPROL-XL) 50 MG 24 hr tablet Take 25 mg by mouth at bedtime. 11/06/20   [provider]  omeprazole (PRILOSEC) 20 MG capsule Take 20 mg by mouth daily.  04/18/14   [provider]   oxyCODONE (OXY IR/ROXICODONE) 5 MG immediate release tablet Take 1-2 tablets (5-10 mg total) by mouth every 4 (four) hours as needed for severe pain. Start with 1 tablet by mouth every 4 hours as needed. Take 2 only for severe pain 10/14/21   Cassandria Anger, PA-C  polyethylene glycol (MIRALAX / GLYCOLAX) 17 g packet Take 17 g by mouth daily as needed for mild constipation. 10/14/21   Cassandria Anger, PA-C  verapamil (CALAN-SR) 240 MG CR tablet Take 1.5 tablets (360 mg total) by mouth at bedtime. 10/19/21   Wendall Stade, MD  warfarin (COUMADIN) 3 MG tablet Take 1/2 a tablet to 1 tablet by mouth daily as directed by the coumadin clinic. 12/23/22   Wendall Stade, MD  zolpidem (AMBIEN) 10 MG tablet Take 5 mg by mouth at bedtime as needed for sleep. 06/12/14   [provider]      Allergies    Patient has no known allergies.    Review of Systems   Review of Systems A 10 point review of systems was performed and is negative unless otherwise reported in HPI.  Physical Exam Updated Vital Signs BP (!) 139/110   Pulse (!) 105   Temp 97.8 F (36.6 C) (Oral)   Resp (!) 33   Ht 5\' 8"  (1.727 m)   Wt 104.3 kg   LMP 04/26/1998 (Within Years)   SpO2 96%   BMI 34.97 kg/m  Physical Exam General: Normal appearing obese female, lying in bed.  HEENT: Sclera anicteric, MMM, trachea midline.  Cardiology: Irregularly irregular and tachycardic rate Resp: Normal respiratory effort. Mildly increased resp rate. CTAB, no wheezes, rhonchi, crackles.  Abd: Soft, non-tender, non-distended. No rebound tenderness or guarding.  GU: Deferred. MSK: No peripheral edema or signs of trauma. Skin: warm, dry. Neuro: A&Ox4, CNs II-XII grossly intact. MAEs. Sensation grossly intact.  Psych: Normal mood and affect.   ED Results / Procedures / Treatments   Labs (all labs ordered are listed, but only abnormal results are displayed) Labs Reviewed  CBC WITH DIFFERENTIAL/PLATELET - Abnormal; Notable for the  following components:      Result Value   WBC 14.2 (*)    Neutro Abs 10.4 (*)    Abs Immature Granulocytes 0.14 (*)    All other components within normal limits  BASIC METABOLIC PANEL - Abnormal; Notable for the following components:   CO2 18 (*)    Glucose, Bld 224 (*)    BUN 37 (*)    Creatinine, Ser 1.53 (*)    GFR, Estimated 35 (*)    All other components within normal limits  PROTIME-INR - Abnormal; Notable for the following components:   Prothrombin Time 25.1 (*)    INR 2.2 (*)    All other components within normal limits  MAGNESIUM - Abnormal; Notable for the following components:   Magnesium 1.3 (*)    All other components within normal limits  POTASSIUM    EKG EKG Interpretation Date/Time:  Monday January 31 2023 12:18:24 EDT Ventricular Rate:  129 PR Interval:    QRS Duration:  138 QT Interval:  346 QTC Calculation: 506 R Axis:   -71  Text Interpretation: Atrial fibrillation with rapid ventricular response with occasional ventricular-paced complexes and with premature ventricular or aberrantly conducted complexes Left axis deviation Left bundle branch block Confirmed by Vivi Barrack [phone]) on 01/31/2023 3:36:02 PM  Radiology DG Chest Port 1 View  Result Date: 01/31/2023 CLINICAL DATA:  Palpitations.  Shortness of breath. EXAM: PORTABLE CHEST 1 VIEW COMPARISON:  February 09, 2011. FINDINGS: Stable cardiomediastinal silhouette. Sternotomy wires are noted. Left-sided pacemaker is in good position. Both lungs are clear. The visualized skeletal structures are unremarkable. IMPRESSION: No active disease. Electronically Signed   By: Lupita Raider M.D.   On: 01/31/2023 14:10    Procedures Procedures    Medications Ordered in ED Medications  amiodarone (NEXTERONE) 1.8 mg/mL load via infusion 150 mg (150 mg Intravenous Bolus from Bag 01/31/23 1444)    Followed by  amiodarone (NEXTERONE PREMIX) 360-4.14 MG/200ML-% (1.8 mg/mL) IV infusion (60 mg/hr Intravenous New  Bag/Given 01/31/23 1456)    Followed by  amiodarone (NEXTERONE PREMIX) 360-4.14 MG/200ML-% (1.8 mg/mL) IV infusion (has no administration in time range)  metoprolol succinate (TOPROL-XL) 24 hr tablet 25 mg (25 mg Oral Given 01/31/23 1457)    ED Course/ Medical Decision Making/  A&P                          Medical Decision Making Amount and/or Complexity of Data Reviewed Labs: ordered. Decision-making details documented in ED Course. Radiology: ordered.  Risk Prescription drug management. Decision regarding hospitalization.    This patient presents to the ED for concern of palpitations/SOB, this involves an extensive number of treatment options, and is a complaint that carries with it a high risk of complications and morbidity.  I considered the following differential and admission for this acute, potentially life threatening condition.   MDM:    Patient with history of AF with RVR and extensive cardiac history including pacemaker, valve replacements, HOCM and myectomy, multiple DCCVs and followed at [facility] w/ cardiology. Patient presents today with symptoms consistent with prior results of A-fib with RVR.  Indeed her EKG and telemetry demonstrate A-fib with RVR with aberrancy.  She has no chest pain and does not have any anginal symptoms dedicate ACS.  Patient unfortunately has been noncompliant with multiple of her medications and is unsure which ones or when her last doses have been.  Will engage with pharmacy to attempt to help determine when the last time she had filled the of her medications.  Her A-fib with RVR today could be simply due to noncompliance.  She also relays that she was out of her potassium, consider electrolyte derangements as well.  Consider dehydration or anemia in addition.  Will interrogate patient's pacemaker.  Her INR today is 2.2 and she relays that she has been compliant with her warfarin but this is also questionable. Will d/w cardiology for recs. No PNA, pulm edema,  pleural effusion on CXR. No asymmetric leg swelling to raise c/f DVT.   Clinical Course as of 01/31/23 1549  Mon Jan 31, 2023  1401 Potassium: 4.1 Hemolyzed at 4.1, will get repeat, likely will be hypokalemic d/t missing potassium tabs at home [HN]  1402 Creatinine(!): 1.53 +AKI [HN]  1418 INR 2.2. D/w cardiology who does not recommend cardioverting her in the ED. Difficult to know which meds she has been taking. Will d/w pharmacist to help determine what she has had filled and how to proceed. Likely will need admission w/ IV amiodarone and possible inpatient DCCV once she has been sorted. Will order IV amiodarone, and in conjunction with cards will order PO toprol 25 mg as well and consult to hospitalist for admission. [HN]  1535 Potassium: 4.7 [HN]    Clinical Course User Index [HN] Loetta Rough, MD    Labs: I Ordered, and personally interpreted labs.  The pertinent results include:  those listed above  Imaging Studies ordered: I ordered imaging studies including CXR I independently visualized and interpreted imaging. I agree with the radiologist interpretation  Additional history obtained from chart review.  External records from outside source obtained and reviewed including [facility]  Cardiac Monitoring: The patient was maintained on a cardiac monitor.  I personally viewed and interpreted the cardiac monitored which showed an underlying rhythm of: Afib w/ RVR  Reevaluation: After the interventions noted above, I reevaluated the patient and found that they have :improved  Social Determinants of Health: Lives independently  Disposition:  Admitted to hospitalist w/ cardiology following  Co morbidities that complicate the patient evaluation  Past Medical History:  Diagnosis Date   Aortic stenosis    21 mm Inspiris Edwards AVR June 2024 - [facility]   Arthritis    Cervical incompetence  GERD (gastroesophageal reflux disease)    Hay fever    Heart block    [employer]  DCPPM on 10/26/2022 with Dr. Zachery Conch - [facility]   Hemochromatosis    HOCM (hypertrophic obstructive cardiomyopathy) [facility])    Septal myomectomy June 2024 - [facility]   Hypertension    Migraines    Mitral stenosis    25 mm [employer] mechanical MVR June 2024 - [facility]   Paroxysmal atrial fibrillation (HCC)    Sciatica    Type 2 diabetes mellitus (HCC)      Medicines Meds ordered this encounter  Medications   FOLLOWED BY Linked Order Group    amiodarone (NEXTERONE) 1.8 mg/mL load via infusion 150 mg    amiodarone (NEXTERONE PREMIX) 360-4.14 MG/200ML-% (1.8 mg/mL) IV infusion    amiodarone (NEXTERONE PREMIX) 360-4.14 MG/200ML-% (1.8 mg/mL) IV infusion   metoprolol succinate (TOPROL-XL) 24 hr tablet 25 mg    I have reviewed the patients home medicines and have made adjustments as needed  Problem List / ED Course: Problem List Items Addressed This Visit   None Visit Diagnoses     Atrial fibrillation with rapid ventricular response (HCC)    -  Primary   Relevant Medications   amiodarone (NEXTERONE) 1.8 mg/mL load via infusion 150 mg (Completed)   amiodarone (NEXTERONE PREMIX) 360-4.14 MG/200ML-% (1.8 mg/mL) IV infusion   amiodarone (NEXTERONE PREMIX) 360-4.14 MG/200ML-% (1.8 mg/mL) IV infusion (Start on 01/31/2023  8:30 PM)   metoprolol succinate (TOPROL-XL) 24 hr tablet 25 mg   Noncompliance with medication regimen       AKI (acute kidney injury) (HCC)                       This Barrera was created using dictation software, which may contain spelling or grammatical errors.    Loetta Rough, MD 01/31/23 [phone]

## 2023-01-31 NOTE — Consult Note (Addendum)
Cardiology Consultation:   Patient ID: [name]; [id number]; 03-12-1951   Admit date: 01/31/2023 Date of Consult: 01/31/2023  Primary Care Provider: Assunta Found, MD Primary Cardiologist: Charlton Haws, MD  History of Present Illness:   [name] is a 72 y.o. female with past medical history outlined below now presenting to the [facility] ER with recurrent atrial fibrillation with RVR.  She saw Dr. Eden Emms back in 2022, has had subsequent evaluation and cardiac surgery at [facility].  She has a history of HOCM, aortic stenosis, and mitral stenosis.  She underwent septal myomectomy, bioprosthetic AVR, and mechanical MVR at [facility] in June as detailed below.  Postoperative course was complicated by symptomatic bradycardia and heart block requiring [employer] pacemaker in July, also recurrent atrial fibrillation requiring cardioversion.  When last seen at [facility] in July she was actually noted to be in rapid atrial fibrillation.  It looks like she was either started on or continued with amiodarone, addition of Toprol-XL, and plan to have an elective cardioversion in August.  Patient states that when she presented for the procedure she was already back in sinus rhythm.  Unfortunately, she has run out of medications since August including amiodarone, Toprol-XL, and verapamil.  She states that she has been taking Coumadin (INR was 1.8 on September 19).  She states that she now intends to follow with cardiology locally, and in fact was scheduled for an APP visit and also to see Dr. Ladona Ridgel on October 15.  She states that she began to experience worsening palpitations last Thursday, worse over the weekend and ultimately associated with exertional weakness and shortness of breath.  Present ECG is most suggestive of atypical atrial flutter with RVR and variable block.   ROS:  Pertinent review in history of present illness.  She reports no leg swelling, no orthopnea or PND.  No syncope.  Past Medical  History:  Diagnosis Date   Aortic stenosis    21 mm Inspiris Edwards AVR June 2024 - [facility]   Arthritis    Cervical incompetence    GERD (gastroesophageal reflux disease)    Hay fever    Heart block    [employer] DCPPM on 10/26/2022 with Dr. Zachery Conch - [facility]   Hemochromatosis    HOCM (hypertrophic obstructive cardiomyopathy) [facility])    Septal myomectomy June 2024 - [facility]   Hypertension    Migraines    Mitral stenosis    25 mm Regent mechanical MVR June 2024 - [facility]   Paroxysmal atrial fibrillation (HCC)    Sciatica    Type 2 diabetes mellitus (HCC)     Past Surgical History:  Procedure Laterality Date   CARDIAC SURGERY     CARPAL TUNNEL RELEASE Right    COLONOSCOPY N/A 08/01/2014   Procedure: COLONOSCOPY;  Surgeon: Malissa Hippo, MD;  Location: [facility] ENDO SUITE;  Service: Endoscopy;  Laterality: N/A;  900 -- moved to 10:00 - Ann notified pt   ELBOW SURGERY     PACEMAKER INSERTION     June 2024   RIGHT/LEFT HEART CATH AND CORONARY ANGIOGRAPHY N/A 07/28/2020   Procedure: RIGHT/LEFT HEART CATH AND CORONARY ANGIOGRAPHY;  Surgeon: Tonny Bollman, MD;  Location: [facility] INVASIVE CV LAB;  Service: Cardiovascular;  Laterality: N/A;   ROTATOR CUFF REPAIR Right 2012   TONSILLECTOMY     TOTAL KNEE ARTHROPLASTY Right 10/13/2021   Procedure: TOTAL KNEE ARTHROPLASTY;  Surgeon: Durene Romans, MD;  Location: [facility] ORS;  Service: Orthopedics;  Laterality: Right;   TUBAL LIGATION  WISDOM TOOTH EXTRACTION       Outpatient Medications: No current facility-administered medications on file prior to encounter.   Current Outpatient Medications on File Prior to Encounter  Medication Sig Dispense Refill   acetaminophen (TYLENOL) 325 MG tablet Take 3 tablets (975 mg total) by mouth every 6 (six) hours as needed for mild pain (pain score 1-3 or temp > 100.5).     docusate sodium (COLACE) 100 MG capsule Take 1 capsule (100 mg total) by mouth 2 (two) times daily. 10 capsule 0   ezetimibe (ZETIA) 10 MG  tablet Take 10 mg by mouth daily.     fluticasone (FLONASE) 50 MCG/ACT nasal spray Place 1-2 sprays into both nostrils as needed for allergies or rhinitis.      hydrochlorothiazide (HYDRODIURIL) 25 MG tablet Take 1 tablet (25 mg total) by mouth daily as needed (Edema). 30 tablet 3   hydrocortisone cream 1 % Apply 1 application. topically daily as needed for itching.     losartan (COZAAR) 50 MG tablet Take 50 mg by mouth daily.     metFORMIN (GLUCOPHAGE) 1000 MG tablet Take 1,000 mg by mouth 2 (two) times daily.     methocarbamol (ROBAXIN) 500 MG tablet Take 1 tablet (500 mg total) by mouth every 6 (six) hours as needed for muscle spasms. 40 tablet 0   metoprolol succinate (TOPROL-XL) 50 MG 24 hr tablet Take 25 mg by mouth at bedtime.     omeprazole (PRILOSEC) 20 MG capsule Take 20 mg by mouth daily.   3   oxyCODONE (OXY IR/ROXICODONE) 5 MG immediate release tablet Take 1-2 tablets (5-10 mg total) by mouth every 4 (four) hours as needed for severe pain. Start with 1 tablet by mouth every 4 hours as needed. Take 2 only for severe pain 42 tablet 0   polyethylene glycol (MIRALAX / GLYCOLAX) 17 g packet Take 17 g by mouth daily as needed for mild constipation. 14 each 0   verapamil (CALAN-SR) 240 MG CR tablet Take 1.5 tablets (360 mg total) by mouth at bedtime. 135 tablet 3   warfarin (COUMADIN) 3 MG tablet Take 1/2 a tablet to 1 tablet by mouth daily as directed by the coumadin clinic. 30 tablet 0   zolpidem (AMBIEN) 10 MG tablet Take 5 mg by mouth at bedtime as needed for sleep.  2     Inpatient Medications: Scheduled Meds:  amiodarone  150 mg Intravenous Once   metoprolol succinate  25 mg Oral Daily   Continuous Infusions:  amiodarone     Followed by   amiodarone      Allergies:   No Known Allergies  Social History:   Social History   Tobacco Use   Smoking status: Former    Current packs/day: 0.00    Average packs/day: 0.1 packs/day for 7.0 years (0.7 ttl pk-yrs)    Types:  Cigarettes    Start date: 09/16/1962    Quit date: 09/15/1969    Years since quitting: 53.4   Smokeless tobacco: Never   Tobacco comments:    as a teenager  Substance Use Topics   Alcohol use: Not Currently    Family History:   The patient's family history includes Breast cancer in her maternal aunt; Cancer in her mother; Heart attack in her father, maternal grandfather, and paternal grandfather; Heart murmur in her brother and father; Migraines in her maternal grandmother.  Physical Exam/Data:   Vitals:   01/31/23 1230 01/31/23 1245 01/31/23 1300 01/31/23 1315  BP: Marland Kitchen)  143/117 (!) 129/115 (!) 114/98 117/87  Pulse: (!) 119 (!) 122 (!) 106 (!) 126  Resp: (!) 42 (!) 30 (!) 34 (!) 30  Temp:      TempSrc:      SpO2: 95% 93% 93% 95%  Weight:      Height:       No intake or output data in the 24 hours ending 01/31/23 1445 Filed Weights   01/31/23 1214  Weight: 104.3 kg   Body mass index is 34.97 kg/m.   Gen: Patient appears comfortable at rest. HEENT: Conjunctiva and lids normal, oropharynx clear. Neck: Supple, no elevated JVP or carotid bruits. Lungs: Clear to auscultation, nonlabored breathing at rest. Cardiac: Irregularly irregular, mechanical click in S1 with 2/6 systolic murmur, no gallop. Abdomen: Soft, nontender, bowel sounds present. Extremities: No pitting edema, distal pulses 2+. Skin: Warm and dry. Musculoskeletal: No kyphosis. Neuropsychiatric: Alert and oriented x3, affect grossly appropriate.  EKG:  An ECG dated June 2023 was personally reviewed today and demonstrated:  Sinus rhythm with poor R wave progression, left anterior fascicular block, PVCs.  Telemetry:  I personally reviewed telemetry which shows atypical atrial flutter with RVR and variable block.  Relevant CV Studies:  Echocardiogram 10/24/2022 ([facility]): INTERPRETATION    NORMAL LEFT VENTRICULAR SYSTOLIC FUNCTION WITH SEVERE LVH    NORMAL RIGHT VENTRICULAR SYSTOLIC FUNCTION    VALVULAR  REGURGITATION: TRIVIAL PR, TRIVIAL TR    PROSTHETIC VALVE(S): MECH PROSTHETIC AoV, MECH PROSTHETIC MV    DOPPER IS AVERAGED DUE TO A0FIB    RAPID A-FIB THROUGHOUT EXAM (HR= 105-152bpm)   TEE 11/11/2022 ([facility]): CONCLUSIONS    1. Well seated mechanical MV with moderate lateral paravalvular leak.    There is no mitral valve stenosis.    2. Bioprosthetic AV  with no eveidnce of significant aortic valve stensois    or regurgitation.    3. No thrombus in LAA   Laboratory Data:  Chemistry Recent Labs  Lab 01/31/23 1230  NA 135  K 4.1  CL 103  CO2 18*  GLUCOSE 224*  BUN 37*  CREATININE 1.53*  CALCIUM 8.9  GFRNONAA 35*  ANIONGAP 14     Hematology Recent Labs  Lab 01/31/23 1230  WBC 14.2*  RBC 4.27  HGB 12.4  HCT 39.4  MCV 92.3  MCH 29.0  MCHC 31.5  RDW 14.0  PLT 395    Radiology/Studies:  DG Chest Port 1 View  Result Date: 01/31/2023 CLINICAL DATA:  Palpitations.  Shortness of breath. EXAM: PORTABLE CHEST 1 VIEW COMPARISON:  February 09, 2011. FINDINGS: Stable cardiomediastinal silhouette. Sternotomy wires are noted. Left-sided pacemaker is in good position. Both lungs are clear. The visualized skeletal structures are unremarkable. IMPRESSION: No active disease. Electronically Signed   By: Lupita Raider M.D.   On: 01/31/2023 14:10    Assessment and Plan:   1.  History of paroxysmal atrial fibrillation and now what appears to be atypical atrial flutter with variable block and RVR.  CHA2DS2-VASc score is 5.  She is on Coumadin as an outpatient for concurrent management of mechanical MVR.  Has been out of Toprol-XL, amiodarone, and Calan SR at some point since August.  Symptom onset was this past Thursday.  2.  History of nonfamilial HOCM status post septal myomectomy at [facility] in June.  3.  History of severe aortic stenosis status post 21 mm Inspiris Edwards AVR at [place] in June.  4.  History of moderate mitral stenosis status post 25 mm Regent mechanical  MVR at [facility] in  June.  5.  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  6.  CKD stage IIIb, creatinine was 1.7 back in July, currently 1.53.  Extensive records reviewed and chart updated.  I went over her medication list and she confirms being out of Toprol-XL, Calan SR, and amiodarone at least since some point in August.  Plan is admission to the hospitalist team, starting IV amiodarone, also start Toprol-XL 25 mg daily.  Continue Coumadin with follow-up by pharmacy.  I suspect she will need a TEE guided cardioversion in the next 24 to 48 hours after IV amiodarone load unless she spontaneously converts (was subtherapeutic on September 19).  For questions or updates, please contact [facility] Please consult [email or web address] for contact info under   Signed, Nona Dell, MD  01/31/2023 2:45 PM

## 2023-02-01 DIAGNOSIS — Z950 Presence of cardiac pacemaker: Secondary | ICD-10-CM

## 2023-02-01 DIAGNOSIS — I4891 Unspecified atrial fibrillation: Secondary | ICD-10-CM | POA: Diagnosis not present

## 2023-02-01 DIAGNOSIS — I4892 Unspecified atrial flutter: Secondary | ICD-10-CM

## 2023-02-01 LAB — BASIC METABOLIC PANEL
Anion gap: 12 (ref 5–15)
BUN: 34 mg/dL — ABNORMAL HIGH (ref 8–23)
CO2: 23 mmol/L (ref 22–32)
Calcium: 8.7 mg/dL — ABNORMAL LOW (ref 8.9–10.3)
Chloride: 102 mmol/L (ref 98–111)
Creatinine, Ser: 1.38 mg/dL — ABNORMAL HIGH (ref 0.44–1.00)
GFR, Estimated: 40 mL/min — ABNORMAL LOW (ref >60)
Glucose, Bld: 149 mg/dL — ABNORMAL HIGH (ref 70–99)
Potassium: 4.1 mmol/L (ref 3.5–5.1)
Sodium: 137 mmol/L (ref 135–145)

## 2023-02-01 LAB — CBC
HCT: 35.1 % — ABNORMAL LOW (ref 36.0–46.0)
Hemoglobin: 10.7 g/dL — ABNORMAL LOW (ref 12.0–15.0)
MCH: 28.3 pg (ref 26.0–34.0)
MCHC: 30.5 g/dL (ref 30.0–36.0)
MCV: 92.9 fL (ref 80.0–100.0)
Platelets: 323 10*3/uL (ref 150–400)
RBC: 3.78 MIL/uL — ABNORMAL LOW (ref 3.87–5.11)
RDW: 13.7 % (ref 11.5–15.5)
WBC: 12 10*3/uL — ABNORMAL HIGH (ref 4.0–10.5)
nRBC: 0 % (ref 0.0–0.2)

## 2023-02-01 LAB — GLUCOSE, CAPILLARY
Glucose-Capillary: 146 mg/dL — ABNORMAL HIGH (ref 70–99)
Glucose-Capillary: 152 mg/dL — ABNORMAL HIGH (ref 70–99)
Glucose-Capillary: 171 mg/dL — ABNORMAL HIGH (ref 70–99)
Glucose-Capillary: 171 mg/dL — ABNORMAL HIGH (ref 70–99)

## 2023-02-01 LAB — PROTIME-INR
INR: 2.5 — ABNORMAL HIGH (ref 0.8–1.2)
Prothrombin Time: 26.9 s — ABNORMAL HIGH (ref 11.4–15.2)

## 2023-02-01 LAB — MAGNESIUM: Magnesium: 1.3 mg/dL — ABNORMAL LOW (ref 1.7–2.4)

## 2023-02-01 MED ORDER — MAGNESIUM SULFATE 4 GM/100ML IV SOLN
4.0000 g | Freq: Once | INTRAVENOUS | Status: AC
  Administered 2023-02-01: 4 g via INTRAVENOUS
  Filled 2023-02-01: qty 100

## 2023-02-01 MED ORDER — WARFARIN SODIUM 2 MG PO TABS
3.0000 mg | ORAL_TABLET | Freq: Once | ORAL | Status: AC
  Administered 2023-02-01: 3 mg via ORAL
  Filled 2023-02-01: qty 1

## 2023-02-01 NOTE — Progress Notes (Signed)
   02/01/23 1027  TOC Brief Assessment  Insurance and Status Reviewed  Patient has primary care physician Yes  Home environment has been reviewed Home with spouse  Prior level of function: Independent  Prior/Current Home Services No current home services  Social Determinants of Health Reivew SDOH reviewed no interventions necessary  Readmission risk has been reviewed Yes  Transition of care needs no transition of care needs at this time   Transition of Care Department [facility]) has reviewed patient and no TOC needs have been identified at this time. We will continue to monitor patient advancement through interdisciplinary progression rounds. If new patient transition needs arise, please place a TOC consult.

## 2023-02-01 NOTE — Progress Notes (Signed)
PHARMACY - ANTICOAGULATION CONSULT NOTE  Pharmacy Consult for warfarin Indication: atrial fibrillation, s/p mitral and aortic valve replacement  No Known Allergies  Patient Measurements: Height: 5\' 8"  (172.7 cm) Weight: 100.3 kg (221 lb 1.9 oz) IBW/kg (Calculated) : 63.9  Vital Signs: Temp: 97.7 F (36.5 C) (10/08 0400) Temp Source: Oral (10/08 0400) BP: 138/122 (10/08 0839) Pulse Rate: 119 (10/08 0839)  Labs: Recent Labs    01/31/23 1230 01/31/23 1231 02/01/23 0447  HGB 12.4  --  10.7*  HCT 39.4  --  35.1*  PLT 395  --  323  LABPROT  --  25.1* 26.9*  INR  --  2.2* 2.5*  CREATININE 1.53*  --  1.38*    Estimated Creatinine Clearance: 44.3 mL/min (A) (by C-G formula based on SCr of 1.38 mg/dL (H)).   Medical History: Past Medical History:  Diagnosis Date   Aortic stenosis    21 mm Inspiris Edwards AVR June 2024 - [facility]   Arthritis    Cervical incompetence    GERD (gastroesophageal reflux disease)    Hay fever    Heart block    [employer] DCPPM on 10/26/2022 with Dr. Zachery Conch - [facility]   Hemochromatosis    HOCM (hypertrophic obstructive cardiomyopathy) [facility])    Septal myomectomy June 2024 - [facility]   Hypertension    Migraines    Mitral stenosis    25 mm Regent mechanical MVR June 2024 - [facility]   Paroxysmal atrial fibrillation (HCC)    Sciatica    Type 2 diabetes mellitus [facility])     Assessment: 72 year old female with recent valve replacement at [facility] presents to [facility] after running out of meds and having palpitations since last week. Home dose listed as 1.5 mg on Saturday and 3 mg ROW.  INR therapeutic at 2.5  Goal of Therapy:  INR goal 2.5-3.5 Monitor platelets by anticoagulation protocol: Yes   Plan:  Warfarin 3 mg tonight Daily INR  Judeth Cornfield, PharmD Clinical Pharmacist 02/01/2023 9:09 AM

## 2023-02-01 NOTE — Progress Notes (Signed)
PROGRESS NOTE    [name]  ZOX:[id number] DOB: 1951-04-07 DOA: 01/31/2023 PCP: Assunta Found, MD   Brief Narrative:    [name] is a 72 y.o. female with medical history significant for HOCM, aortic stenosis and mitral stenosis status post bioprosthetic AVR and mechanical MVR at [facility], heart block with PPM, type 2 diabetes, hypertension, and recurrent atrial fibrillation requiring cardioversion who presented to the ED with complaints of worsening palpitations since last Thursday that was associated with exertional shortness of breath as well as weakness.  She was admitted with atrial fibrillation with RVR and remains on IV amiodarone drip per cardiology recommendations with plans for potential TEE/DCCV in a.m.  Assessment & Plan:   Principal Problem:   Atrial fibrillation with RVR (HCC) Active Problems:   Essential hypertension   BMI 34.0-34.9,adult   Mitral valve replaced   S/P AVR (aortic valve replacement)  Assessment and Plan:  Atrial fibrillation with RVR -Continue Coumadin per pharmacy, currently therapeutic -Toprol-XL 25 mg daily as well as IV amiodarone -Might require TEE guided cardioversion in the next 24 hours, will plan to keep n.p.o. after midnight -Appreciate further cardiology recommendations  Hypomagnesemia -Replete and reevaluate in a.m.   Mitral/aortic valve replacement -Status post valve replacement at [facility] 09/2022 -Continue Coumadin as above   History of nonfamilial HOCM  -Status post septal myomectomy at [facility] 09/2022 -Postoperative symptomatic bradycardia status post pacemaker placement   Essential hypertension -Continue on Toprol-XL while on amiodarone drip for now -Labetalol will be ordered as needed   Type 2 diabetes -Maintain on SSI -Hold home oral agents -Carb modified diet   CKD stage IIIb -Previously noted to have creatinine 1.7   Obesity -BMI 34.97    DVT prophylaxis:Warfarin Code Status: DNR Family Communication: None at  bedside Disposition Plan: Continue management for heart rate control Status is: Inpatient Remains inpatient appropriate because: Need for IV medications.   Consultants:  Cardiology  Procedures:  None  Antimicrobials:  None   Subjective: Patient seen and evaluated today with no new acute complaints or concerns. No acute concerns or events noted overnight.  Heart rate remains in the 120 bpm range and she denies any palpitations, chest pain, or shortness of breath.  Objective: Vitals:   02/01/23 0800 02/01/23 0839 02/01/23 0930 02/01/23 1030  BP:  (!) 138/122 (!) 135/104 (!) 137/97  Pulse: (!) 130 (!) 119 (!) 128 68  Resp: (!) 29 (!) 29 (!) 30 (!) 32  Temp:      TempSrc:      SpO2: 96% 96% 94% 96%  Weight:      Height:        Intake/Output Summary (Last 24 hours) at 02/01/2023 1120 Last data filed at 02/01/2023 1032 Gross per 24 hour  Intake 661.35 ml  Output --  Net 661.35 ml   Filed Weights   01/31/23 1214 01/31/23 1627 02/01/23 0500  Weight: 104.3 kg 100.3 kg 100.3 kg    Examination:  General exam: Appears calm and comfortable  Respiratory system: Clear to auscultation. Respiratory effort normal. Cardiovascular system: S1 & S2 heard, irregular and tachycardic Gastrointestinal system: Abdomen is soft Central nervous system: Alert and awake Extremities: No edema Skin: No significant lesions noted Psychiatry: Flat affect.    Data Reviewed: I have personally reviewed following labs and imaging studies  CBC: Recent Labs  Lab 01/31/23 1230 02/01/23 0447  WBC 14.2* 12.0*  NEUTROABS 10.4*  --   HGB 12.4 10.7*  HCT 39.4 35.1*  MCV 92.3  92.9  PLT 395 323   Basic Metabolic Panel: Recent Labs  Lab 01/31/23 1230 01/31/23 1427 02/01/23 0447  NA 135  --  137  K 4.1 4.7 4.1  CL 103  --  102  CO2 18*  --  23  GLUCOSE 224*  --  149*  BUN 37*  --  34*  CREATININE 1.53*  --  1.38*  CALCIUM 8.9  --  8.7*  MG 1.3*  --  1.3*   GFR: Estimated Creatinine  Clearance: 44.3 mL/min (A) (by C-G formula based on SCr of 1.38 mg/dL (H)). Liver Function Tests: No results for input(s): "AST", "ALT", "ALKPHOS", "BILITOT", "PROT", "ALBUMIN" in the last 168 hours. No results for input(s): "LIPASE", "AMYLASE" in the last 168 hours. No results for input(s): "AMMONIA" in the last 168 hours. Coagulation Profile: Recent Labs  Lab 01/27/23 1342 01/31/23 1231 02/01/23 0447  INR 2.1 2.2* 2.5*   Cardiac Enzymes: No results for input(s): "CKTOTAL", "CKMB", "CKMBINDEX", "TROPONINI" in the last 168 hours. BNP (last 3 results) No results for input(s): "PROBNP" in the last 8760 hours. HbA1C: Recent Labs    01/31/23 1230  HGBA1C 7.5*   CBG: Recent Labs  Lab 01/31/23 1645 01/31/23 2111 02/01/23 0736  GLUCAP 172* 158* 146*   Lipid Profile: No results for input(s): "CHOL", "HDL", "LDLCALC", "TRIG", "CHOLHDL", "LDLDIRECT" in the last 72 hours. Thyroid Function Tests: Recent Labs    01/31/23 1427  TSH 2.386   Anemia Panel: No results for input(s): "VITAMINB12", "FOLATE", "FERRITIN", "TIBC", "IRON", "RETICCTPCT" in the last 72 hours. Sepsis Labs: No results for input(s): "PROCALCITON", "LATICACIDVEN" in the last 168 hours.  Recent Results (from the past 240 hour(s))  MRSA Next Gen by PCR, Nasal     Status: None   Collection Time: 01/31/23  4:14 PM   Specimen: Nasal Mucosa; Nasal Swab  Result Value Ref Range Status   MRSA by PCR Next Gen NOT DETECTED NOT DETECTED Final    Comment: (NOTE) The GeneXpert MRSA Assay (FDA approved for NASAL specimens only), is one component of a comprehensive MRSA colonization surveillance program. It is not intended to diagnose MRSA infection nor to guide or monitor treatment for MRSA infections. Test performance is not FDA approved in patients less than 66 years old. Performed at [facility], [address] [place], [place] [address]          Radiology Studies: [facility] 1 View  Result Date:  01/31/2023 CLINICAL DATA:  Palpitations.  Shortness of breath. EXAM: PORTABLE CHEST 1 VIEW COMPARISON:  February 09, 2011. FINDINGS: Stable cardiomediastinal silhouette. Sternotomy wires are noted. Left-sided pacemaker is in good position. Both lungs are clear. The visualized skeletal structures are unremarkable. IMPRESSION: No active disease. Electronically Signed   By: Lupita Raider M.D.   On: 01/31/2023 14:10        Scheduled Meds:  Chlorhexidine Gluconate Cloth  6 each Topical Q0600   ezetimibe  10 mg Oral Daily   influenza vaccine adjuvanted  0.5 mL Intramuscular Tomorrow-1000   insulin aspart  0-5 Units Subcutaneous QHS   insulin aspart  0-9 Units Subcutaneous TID WC   metoprolol succinate  25 mg Oral Daily   pantoprazole  40 mg Oral Daily   pneumococcal 20-valent conjugate vaccine  0.5 mL Intramuscular Tomorrow-1000   warfarin  3 mg Oral ONCE-1600   Warfarin - Pharmacist Dosing Inpatient   Does not apply q1600   Continuous Infusions:  amiodarone 30 mg/hr (02/01/23 0334)  LOS: 1 day    Time spent: 35 minutes    Levan Aloia Hoover Brunette, DO [facility]  If 7PM-7AM, please contact night-coverage [email or web address] 02/01/2023, 11:20 AM

## 2023-02-01 NOTE — Progress Notes (Signed)
Progress Note  Patient Name: [name] Date of Encounter: 02/01/2023  Primary Cardiologist: Charlton Haws, MD  Subjective   Asymptomatic, no symptoms.  Telemetry reviewed, A-fib versus atypical atrial flutter with RVR, HR 120s.  Inpatient Medications    Scheduled Meds:  Chlorhexidine Gluconate Cloth  6 each Topical Q0600   ezetimibe  10 mg Oral Daily   influenza vaccine adjuvanted  0.5 mL Intramuscular Tomorrow-1000   insulin aspart  0-5 Units Subcutaneous QHS   insulin aspart  0-9 Units Subcutaneous TID WC   metoprolol succinate  25 mg Oral Daily   pantoprazole  40 mg Oral Daily   pneumococcal 20-valent conjugate vaccine  0.5 mL Intramuscular Tomorrow-1000   warfarin  3 mg Oral ONCE-1600   Warfarin - Pharmacist Dosing Inpatient   Does not apply q1600   Continuous Infusions:  amiodarone 30 mg/hr (02/01/23 0334)   PRN Meds: acetaminophen **OR** acetaminophen, labetalol, methocarbamol, ondansetron **OR** ondansetron (ZOFRAN) IV, oxyCODONE, zolpidem   Vital Signs    Vitals:   02/01/23 0600 02/01/23 0700 02/01/23 0800 02/01/23 0839  BP: 104/79 (!) 131/93  (!) 138/122  Pulse: (!) 123 (!) 123 (!) 130 (!) 119  Resp: (!) 31 (!) 22 (!) 29 (!) 29  Temp:      TempSrc:      SpO2: 94% 96% 96% 96%  Weight:      Height:        Intake/Output Summary (Last 24 hours) at 02/01/2023 0915 Last data filed at 02/01/2023 0322 Gross per 24 hour  Intake 373.11 ml  Output --  Net 373.11 ml   Filed Weights   01/31/23 1214 01/31/23 1627 02/01/23 0500  Weight: 104.3 kg 100.3 kg 100.3 kg    Telemetry     Personally reviewed, A-fib versus atypical atrial flutter with RVR, 120s  ECG   None performed today  Physical Exam   GEN: No acute distress.   Neck: No JVD. Cardiac: RRR, no murmur, rub, or gallop.  Respiratory: Nonlabored. Clear to auscultation bilaterally. GI: Soft, nontender, bowel sounds present. MS: No edema; No deformity. Neuro:  Nonfocal. Psych: Alert and  oriented x 3. Normal affect.  Labs    Chemistry Recent Labs  Lab 01/31/23 1230 01/31/23 1427 02/01/23 0447  NA 135  --  137  K 4.1 4.7 4.1  CL 103  --  102  CO2 18*  --  23  GLUCOSE 224*  --  149*  BUN 37*  --  34*  CREATININE 1.53*  --  1.38*  CALCIUM 8.9  --  8.7*  GFRNONAA 35*  --  40*  ANIONGAP 14  --  12     Hematology Recent Labs  Lab 01/31/23 1230 02/01/23 0447  WBC 14.2* 12.0*  RBC 4.27 3.78*  HGB 12.4 10.7*  HCT 39.4 35.1*  MCV 92.3 92.9  MCH 29.0 28.3  MCHC 31.5 30.5  RDW 14.0 13.7  PLT 395 323    Cardiac EnzymesNo results for input(s): "TROPONINIHS" in the last 720 hours.  BNPNo results for input(s): "BNP", "PROBNP" in the last 168 hours.   DDimerNo results for input(s): "DDIMER" in the last 168 hours.   Radiology    DG Chest Port 1 View  Result Date: 01/31/2023 CLINICAL DATA:  Palpitations.  Shortness of breath. EXAM: PORTABLE CHEST 1 VIEW COMPARISON:  February 09, 2011. FINDINGS: Stable cardiomediastinal silhouette. Sternotomy wires are noted. Left-sided pacemaker is in good position. Both lungs are clear. The visualized skeletal structures are unremarkable. IMPRESSION:  No active disease. Electronically Signed   By: Lupita Raider M.D.   On: 01/31/2023 14:10     Assessment & Plan   Atrial fibrillation with RVR (CV score is 5) History of nonfamilial HOCM s/p septal myomectomy at [facility] in June 2024 History of severe aortic stenosis s/p 21 mm Inspiris Edwards AVR at [facility] in June 2024 History of moderate mitral stenosis s/p 25 mm Regent mechanical MVR at [facility] in June 2024. 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 CKD stage IIIb, serum creatinine improving (1.7 in July, 1.3 today)    -Presented with palpitations, EKG showed atypical atrial flutter with RVR.  Telemetry reviewed, A-fib versus atypical atrial flutter, 120s HR currently on IV amiodarone drip and metoprolol succinate 25 mg once daily. On  Coumadin, INR 2.5 today. Subtherapeutic 1.8 around 2 weeks ago.  Will continue amiodarone drip for a total duration of 24 hours and post for TEE guided DCCV tomorrow on 02/02/2023.  Continue Coumadin, metoprolol and amiodarone drip.   Informed consent for TEE guided DCCV Risks and benefits of the procedure are explained to the patient.  These risks include but not limited to sore throat, completing, esophageal perforation, cardiac arrest, unsuccessful cardioversion, infection, pneumonia etc.  Patient comprehended these risks and agreed to proceed with the procedure.  -Patient would like to switch her care from [facility] to [place] as she lives here.      Signed, Marjo Bicker, MD  02/01/2023, 9:15 AM

## 2023-02-02 ENCOUNTER — Inpatient Hospital Stay (HOSPITAL_COMMUNITY): Payer: Medicare Other | Admitting: Certified Registered"

## 2023-02-02 ENCOUNTER — Inpatient Hospital Stay (HOSPITAL_COMMUNITY): Payer: Medicare Other

## 2023-02-02 ENCOUNTER — Encounter (HOSPITAL_COMMUNITY)
Admission: EM | Disposition: A | Discharge status: Home / Self Care | Payer: Self-pay | Source: Home / Self Care | Attending: Internal Medicine

## 2023-02-02 DIAGNOSIS — I4891 Unspecified atrial fibrillation: Secondary | ICD-10-CM | POA: Diagnosis not present

## 2023-02-02 DIAGNOSIS — I361 Nonrheumatic tricuspid (valve) insufficiency: Secondary | ICD-10-CM

## 2023-02-02 DIAGNOSIS — I4892 Unspecified atrial flutter: Secondary | ICD-10-CM

## 2023-02-02 DIAGNOSIS — I340 Nonrheumatic mitral (valve) insufficiency: Secondary | ICD-10-CM

## 2023-02-02 HISTORY — PX: CARDIOVERSION: SHX1299

## 2023-02-02 HISTORY — PX: TEE WITHOUT CARDIOVERSION: SHX5443

## 2023-02-02 LAB — BASIC METABOLIC PANEL
Anion gap: 11 (ref 5–15)
BUN: 28 mg/dL — ABNORMAL HIGH (ref 8–23)
CO2: 22 mmol/L (ref 22–32)
Calcium: 8.3 mg/dL — ABNORMAL LOW (ref 8.9–10.3)
Chloride: 103 mmol/L (ref 98–111)
Creatinine, Ser: 1.2 mg/dL — ABNORMAL HIGH (ref 0.44–1.00)
GFR, Estimated: 48 mL/min — ABNORMAL LOW (ref >60)
Glucose, Bld: 161 mg/dL — ABNORMAL HIGH (ref 70–99)
Potassium: 3.8 mmol/L (ref 3.5–5.1)
Sodium: 136 mmol/L (ref 135–145)

## 2023-02-02 LAB — CBC
HCT: 35.4 % — ABNORMAL LOW (ref 36.0–46.0)
Hemoglobin: 10.8 g/dL — ABNORMAL LOW (ref 12.0–15.0)
MCH: 28.2 pg (ref 26.0–34.0)
MCHC: 30.5 g/dL (ref 30.0–36.0)
MCV: 92.4 fL (ref 80.0–100.0)
Platelets: 309 10*3/uL (ref 150–400)
RBC: 3.83 MIL/uL — ABNORMAL LOW (ref 3.87–5.11)
RDW: 14.1 % (ref 11.5–15.5)
WBC: 11.8 10*3/uL — ABNORMAL HIGH (ref 4.0–10.5)
nRBC: 0 % (ref 0.0–0.2)

## 2023-02-02 LAB — PROTIME-INR
INR: 2.9 — ABNORMAL HIGH (ref 0.8–1.2)
Prothrombin Time: 30.1 s — ABNORMAL HIGH (ref 11.4–15.2)

## 2023-02-02 LAB — GLUCOSE, CAPILLARY
Glucose-Capillary: 172 mg/dL — ABNORMAL HIGH (ref 70–99)
Glucose-Capillary: 147 mg/dL — ABNORMAL HIGH (ref 70–99)
Glucose-Capillary: 147 mg/dL — ABNORMAL HIGH (ref 70–99)

## 2023-02-02 LAB — MAGNESIUM: Magnesium: 1.9 mg/dL (ref 1.7–2.4)

## 2023-02-02 SURGERY — CARDIOVERSION
Anesthesia: General

## 2023-02-02 MED ORDER — METOPROLOL SUCCINATE ER 25 MG PO TB24: 25.0000 mg | ORAL_TABLET | Freq: Every day | ORAL | 2 refills | Status: DC

## 2023-02-02 MED ORDER — PROPOFOL 10 MG/ML IV BOLUS
INTRAVENOUS | Status: DC | PRN
  Administered 2023-02-02: 70 mg via INTRAVENOUS

## 2023-02-02 MED ORDER — LACTATED RINGERS IV SOLN: INTRAVENOUS | Status: DC

## 2023-02-02 MED ORDER — CHLORHEXIDINE GLUCONATE 0.12 % MT SOLN: 15.0000 mL | Freq: Once | OROMUCOSAL | Status: DC

## 2023-02-02 MED ORDER — BUTAMBEN-TETRACAINE-BENZOCAINE 2-2-14 % EX AERO
INHALATION_SPRAY | CUTANEOUS | Status: DC | PRN
Start: 2023-02-02 — End: 2023-02-02
  Administered 2023-02-02: 2 via TOPICAL

## 2023-02-02 MED ORDER — AMIODARONE HCL 200 MG PO TABS
ORAL_TABLET | ORAL | 0 refills | Status: DC
Start: 2023-02-02 — End: 2023-03-23

## 2023-02-02 MED ORDER — PHENYLEPHRINE 80 MCG/ML (10ML) SYRINGE FOR IV PUSH (FOR BLOOD PRESSURE SUPPORT)
PREFILLED_SYRINGE | INTRAVENOUS | Status: AC
  Filled 2023-02-02: qty 10

## 2023-02-02 MED ORDER — SODIUM CHLORIDE 0.9 % IV SOLN
INTRAVENOUS | Status: DC | PRN
Start: 2023-02-02 — End: 2023-02-02

## 2023-02-02 MED ORDER — ORAL CARE MOUTH RINSE: 15.0000 mL | Freq: Once | OROMUCOSAL | Status: DC

## 2023-02-02 MED ORDER — PROPOFOL 500 MG/50ML IV EMUL
INTRAVENOUS | Status: DC | PRN
  Administered 2023-02-02: 75 ug/kg/min via INTRAVENOUS

## 2023-02-02 MED ORDER — WARFARIN SODIUM 2 MG PO TABS
3.0000 mg | ORAL_TABLET | Freq: Once | ORAL | Status: AC
  Administered 2023-02-02: 3 mg via ORAL
  Filled 2023-02-02: qty 1

## 2023-02-02 NOTE — Discharge Summary (Signed)
Physician Discharge Summary   Patient: [name] MRN: [id number] DOB: Sep 26, 1950  Admit date:     01/31/2023  Discharge date: 02/02/23  Discharge Physician: Kendell Bane   PCP: Assunta Found, MD   Recommendations at discharge:    Follow-up with cardiologist in 2-3 weeks Amiodarone 200 mg BID x 3 weeks  Followed by amiodarone 200 mg once daily.  Continue metoprolol Continue Coumadin with INR goal of 2-3 Follow with PCP in 1-2 weeks  Discharge Diagnoses: Principal Problem:   Atrial fibrillation with RVR (HCC) Active Problems:   Essential hypertension   BMI 34.0-34.9,adult   Mitral valve replaced   S/P AVR (aortic valve replacement)   [name] is a 72 y.o. female with medical history significant for HOCM, aortic stenosis and mitral stenosis status post bioprosthetic AVR and mechanical MVR at [facility], heart block with PPM, type 2 diabetes, hypertension, and recurrent atrial fibrillation requiring cardioversion who presented to the ED with complaints of worsening palpitations since last Thursday that was associated with exertional shortness of breath as well as weakness.  She was admitted with atrial fibrillation with RVR and remains on IV amiodarone drip per cardiology recommendations with plans for potential TEE/DCCV in a.m.        Atrial fibrillation with RVR -Continue Coumadin per pharmacy, currently therapeutic INR at 2.9 -Toprol-XL 25 mg daily  -Has been on IV  amiodarone -Cardiology following closely,-anticipating TEE cardioversion today 02/02/2023   Post DCCV EKG showed AV paced rhyth, HR 60s. Stop Amidoarone drip and start p.o amiodarone 200 mg BID x 3 weeks followed by amiodarone 200 mg once daily.  Continue metoprolol succinate 25 mg once daily. Continue current Coumadin dosing.    Hypomagnesemia -Repleted   Mitral/aortic valve replacement -Status post valve replacement at [facility] 09/2022 -Continue Coumadin as above   History of nonfamilial HOCM  -Status  post septal myomectomy at [facility] 09/2022 -Postoperative symptomatic bradycardia status post pacemaker placement   Essential hypertension -Continue on Toprol-XL while on amiodarone drip for now -Titrating for better BP control   Type 2 diabetes -Resume home medication, strict diabetic diet I am not discharging on a daily   CKD stage IIIb -Previously noted to have creatinine 1.7 Recent Labs       Lab Results  Component Value Date    CREATININE 1.20 (H) 02/02/2023    CREATININE 1.38 (H) 02/01/2023    CREATININE 1.53 (H) 01/31/2023          Obesity -BMI 34.97   Consultants: Cardiology Procedures performed: Status post cardioversion Disposition: Home Diet recommendation:  Discharge Diet Orders (From admission, onward)     Start     Ordered   02/02/23 0000  Diet - low sodium heart healthy        02/02/23 1449           Cardiac diet DISCHARGE MEDICATION: Allergies as of 02/02/2023       Reactions   Nystatin Other (See Comments)   unknown   Other Other (See Comments)   Product Containing 3-Hydroxy-3-Methylglutaryl-Coenzyme A Reductase Inhibitor (Product) unknown   Statins Other (See Comments)   Muscles aches,hurt        Medication List     STOP taking these medications    diltiazem 180 MG 24 hr capsule Commonly known as: TIAZAC   hydrochlorothiazide 25 MG tablet Commonly known as: HYDRODIURIL   nitrofurantoin (macrocrystal-monohydrate) 100 MG capsule Commonly known as: MACROBID   verapamil 240 MG CR tablet Commonly known as: CALAN-SR  ZZZQUIL PO       TAKE these medications    amiodarone 200 MG tablet Commonly known as: Pacerone Take 1 tablet (200 mg total) by mouth 2 (two) times daily for 21 days, THEN 1 tablet (200 mg total) daily. Amiodarone 200 mg BID x 3 weeks followed by amiodarone 200 mg once daily.. Start taking on: February 02, 2023   ezetimibe 10 MG tablet Commonly known as: ZETIA Take 10 mg by mouth daily.   losartan 25 MG  tablet Commonly known as: COZAAR Take 1 tablet by mouth daily.   magnesium oxide 400 (240 Mg) MG tablet Commonly known as: MAG-OX Take 1 tablet by mouth 2 (two) times daily.   melatonin 1 MG Tabs tablet Take 4 mg by mouth at bedtime as needed (sleep). gummies   metFORMIN 500 MG tablet Commonly known as: GLUCOPHAGE Take 500 mg by mouth 2 (two) times daily with a meal.   metoprolol succinate 25 MG 24 hr tablet Commonly known as: TOPROL-XL Take 1 tablet (25 mg total) by mouth daily. Start taking on: February 03, 2023   omeprazole 20 MG capsule Commonly known as: PRILOSEC Take 20 mg by mouth daily.   Potassium Chloride ER 20 MEQ Tbcr Take 1 tablet by mouth daily.   warfarin 3 MG tablet Commonly known as: COUMADIN Take as directed. If you are unsure how to take this medication, talk to your nurse or doctor. Original instructions: Take 1/2 a tablet to 1 tablet by mouth daily as directed by the coumadin clinic.        Discharge Exam: Filed Weights   01/31/23 1627 02/01/23 0500 02/02/23 0500  Weight: 100.3 kg 100.3 kg 101.2 kg        General:  AAO x 3,  cooperative, no distress;   HEENT:  Normocephalic, PERRL, otherwise with in Normal limits   Neuro:  CNII-XII intact. , normal motor and sensation, reflexes intact   Lungs:   Clear to auscultation BL, Respirations unlabored,  No wheezes / crackles  Cardio:    S1/S2, RRR, No murmure, No Rubs or Gallops   Abdomen:  Soft, non-tender, bowel sounds active all four quadrants, no guarding or peritoneal signs.  Muscular  skeletal:  Limited exam -global generalized weaknesses - in bed, able to move all 4 extremities,   2+ pulses,  symmetric, No pitting edema  Skin:  Dry, warm to touch, negative for any Rashes,  Wounds: Please see nursing documentation          Condition at discharge: good  The results of significant diagnostics from this hospitalization (including imaging, microbiology, ancillary and laboratory) are  listed below for reference.   Imaging Studies: DG Chest Port 1 View  Result Date: 01/31/2023 CLINICAL DATA:  Palpitations.  Shortness of breath. EXAM: PORTABLE CHEST 1 VIEW COMPARISON:  February 09, 2011. FINDINGS: Stable cardiomediastinal silhouette. Sternotomy wires are noted. Left-sided pacemaker is in good position. Both lungs are clear. The visualized skeletal structures are unremarkable. IMPRESSION: No active disease. Electronically Signed   By: Lupita Raider M.D.   On: 01/31/2023 14:10    Microbiology: Results for orders placed or performed during the hospital encounter of 01/31/23  MRSA Next Gen by PCR, Nasal     Status: None   Collection Time: 01/31/23  4:14 PM   Specimen: Nasal Mucosa; Nasal Swab  Result Value Ref Range Status   MRSA by PCR Next Gen NOT DETECTED NOT DETECTED Final    Comment: (NOTE) The GeneXpert MRSA Assay (  FDA approved for NASAL specimens only), is one component of a comprehensive MRSA colonization surveillance program. It is not intended to diagnose MRSA infection nor to guide or monitor treatment for MRSA infections. Test performance is not FDA approved in patients less than 53 years old. Performed at [facility], [address], [place], [place] [address]     Labs: CBC: Recent Labs  Lab 01/31/23 1230 02/01/23 0447 02/02/23 0457  WBC 14.2* 12.0* 11.8*  NEUTROABS 10.4*  --   --   HGB 12.4 10.7* 10.8*  HCT 39.4 35.1* 35.4*  MCV 92.3 92.9 92.4  PLT 395 323 309   Basic Metabolic Panel: Recent Labs  Lab 01/31/23 1230 01/31/23 1427 02/01/23 0447 02/02/23 0457  NA 135  --  137 136  K 4.1 4.7 4.1 3.8  CL 103  --  102 103  CO2 18*  --  23 22  GLUCOSE 224*  --  149* 161*  BUN 37*  --  34* 28*  CREATININE 1.53*  --  1.38* 1.20*  CALCIUM 8.9  --  8.7* 8.3*  MG 1.3*  --  1.3* 1.9   Liver Function Tests: No results for input(s): "AST", "ALT", "ALKPHOS", "BILITOT", "PROT", "ALBUMIN" in the last 168 hours. CBG: Recent Labs  Lab 02/01/23 1616  02/01/23 2055 02/02/23 0735 02/02/23 1211 02/02/23 1303  GLUCAP 171* 171* 172* 147* 147*    Discharge time spent: greater than 30 minutes.  Signed: Kendell Bane, MD [facility] 02/02/2023

## 2023-02-02 NOTE — Progress Notes (Signed)
Progress Note  Patient Name: [name] Date of Encounter: 02/02/2023  Primary Cardiologist: Charlton Haws, MD  Subjective   Asymptomatic, no symptoms.  Telemetry reviewed, A-fib versus atypical atrial flutter, HR 120s.  Inpatient Medications    Scheduled Meds:  Chlorhexidine Gluconate Cloth  6 each Topical Q0600   ezetimibe  10 mg Oral Daily   influenza vaccine adjuvanted  0.5 mL Intramuscular Tomorrow-1000   insulin aspart  0-5 Units Subcutaneous QHS   insulin aspart  0-9 Units Subcutaneous TID WC   metoprolol succinate  25 mg Oral Daily   pantoprazole  40 mg Oral Daily   pneumococcal 20-valent conjugate vaccine  0.5 mL Intramuscular Tomorrow-1000   warfarin  3 mg Oral ONCE-1600   Warfarin - Pharmacist Dosing Inpatient   Does not apply q1600   Continuous Infusions:  amiodarone 30 mg/hr (02/02/23 0500)   PRN Meds: acetaminophen **OR** acetaminophen, labetalol, methocarbamol, ondansetron **OR** ondansetron (ZOFRAN) IV, oxyCODONE, zolpidem   Vital Signs    Vitals:   02/02/23 0300 02/02/23 0400 02/02/23 0500 02/02/23 0737  BP: (!) 141/104 (!) 123/91 (!) 150/120   Pulse: (!) 119 (!) 44 67   Resp: (!) 31 (!) 32 (!) 28   Temp:  98.6 F (37 C) (!) 97.4 F (36.3 C) 99.1 F (37.3 C)  TempSrc:  Oral Oral Oral  SpO2: 94% 94% 97%   Weight:   101.2 kg   Height:        Intake/Output Summary (Last 24 hours) at 02/02/2023 1014 Last data filed at 02/02/2023 0500 Gross per 24 hour  Intake 1049.31 ml  Output --  Net 1049.31 ml   Filed Weights   01/31/23 1627 02/01/23 0500 02/02/23 0500  Weight: 100.3 kg 100.3 kg 101.2 kg    Telemetry     Personally reviewed, A-fib versus atypical atrial flutter with RVR, HR 120s.  ECG   None performed today  Physical Exam   GEN: No acute distress.   Neck: No JVD. Cardiac: RRR, no murmur, rub, or gallop.  Respiratory: Nonlabored. Clear to auscultation bilaterally. GI: Soft, nontender, bowel sounds present. MS: No edema; No  deformity. Neuro:  Nonfocal. Psych: Alert and oriented x 3. Normal affect.  Labs    Chemistry Recent Labs  Lab 01/31/23 1230 01/31/23 1427 02/01/23 0447 02/02/23 0457  NA 135  --  137 136  K 4.1 4.7 4.1 3.8  CL 103  --  102 103  CO2 18*  --  23 22  GLUCOSE 224*  --  149* 161*  BUN 37*  --  34* 28*  CREATININE 1.53*  --  1.38* 1.20*  CALCIUM 8.9  --  8.7* 8.3*  GFRNONAA 35*  --  40* 48*  ANIONGAP 14  --  12 11     Hematology Recent Labs  Lab 01/31/23 1230 02/01/23 0447 02/02/23 0457  WBC 14.2* 12.0* 11.8*  RBC 4.27 3.78* 3.83*  HGB 12.4 10.7* 10.8*  HCT 39.4 35.1* 35.4*  MCV 92.3 92.9 92.4  MCH 29.0 28.3 28.2  MCHC 31.5 30.5 30.5  RDW 14.0 13.7 14.1  PLT 395 323 309    Cardiac EnzymesNo results for input(s): "TROPONINIHS" in the last 720 hours.  BNPNo results for input(s): "BNP", "PROBNP" in the last 168 hours.   DDimerNo results for input(s): "DDIMER" in the last 168 hours.   Radiology    DG Chest Port 1 View  Result Date: 01/31/2023 CLINICAL DATA:  Palpitations.  Shortness of breath. EXAM: PORTABLE CHEST 1  VIEW COMPARISON:  February 09, 2011. FINDINGS: Stable cardiomediastinal silhouette. Sternotomy wires are noted. Left-sided pacemaker is in good position. Both lungs are clear. The visualized skeletal structures are unremarkable. IMPRESSION: No active disease. Electronically Signed   By: Lupita Raider M.D.   On: 01/31/2023 14:10     Assessment & Plan   Atrial fibrillation with RVR (CV score is 5) History of nonfamilial HOCM s/p septal myomectomy at [facility] in June 2024 History of severe aortic stenosis s/p 21 mm Inspiris Edwards AVR at [facility] in June 2024 History of moderate mitral stenosis s/p 25 mm Regent mechanical MVR at [facility] in June 2024. 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 CKD stage IIIb, serum creatinine improving (1.7 in July, 1.3 today)   -Presented with palpitations, EKG showed atypical atrial  flutter with RVR. Telemetry reviewed, A-fib versus atypical atrial flutter, 120s HR currently on IV amiodarone drip and metoprolol succinate 25 mg once daily. On Coumadin, INR 2.9 today, subtherapeutic at 1.8 around 2 weeks ago.N.p.o. for TEE guided DCCV today. Continue Coumadin, metoprolol and amiodarone drip. Outpatient referral to electrophysiology for alternative antiarrhythmic options versus ablation.  Informed consent for TEE guided DCCV Risks and benefits of the procedure are explained to the patient.  These risks include but not limited to sore throat, completing, esophageal perforation, cardiac arrest, unsuccessful cardioversion, infection, pneumonia etc.  Patient comprehended these risks and agreed to proceed with the procedure.   -Patient would like to switch her care from [facility] to [facility] as she lives here.     Signed, Hana Trippett Demetrius Charity Sierria Bruney, MD  02/02/2023, 10:14 AM

## 2023-02-02 NOTE — Progress Notes (Signed)
Electrical Cardioversion Procedure Note [name] [id number] [birth year]  Procedure: Electrical Cardioversion Indications:  Atrial Flutter  Procedure Details Consent: Risks of procedure as well as the alternatives and risks of each were explained to the (patient/caregiver).  Consent for procedure obtained. Time Out: Verified patient identification, verified procedure, site/side was marked, verified correct patient position, special equipment/implants available, medications/allergies/relevent history reviewed, required imaging and test results available.  Performed at 1330  Patient placed on cardiac monitor, pulse oximetry, supplemental oxygen as necessary.  Sedation given: Benzodiazepines Pacer pads placed anterior and posterior chest.  Cardioverted 1 time(s).  Cardioverted at 120J.  Evaluation Findings: Post procedure EKG shows:    duel paced rhythm  Complications: None Patient did tolerate procedure well.   Ramiro Harvest 02/02/2023, 2:15 PM

## 2023-02-02 NOTE — Progress Notes (Signed)
PROGRESS NOTE    [name]  ZOX:[id number] DOB: 1950-08-15 DOA: 01/31/2023 PCP: Assunta Found, MD   Brief Narrative:    [name] is a 72 y.o. female with medical history significant for HOCM, aortic stenosis and mitral stenosis status post bioprosthetic AVR and mechanical MVR at [facility], heart block with PPM, type 2 diabetes, hypertension, and recurrent atrial fibrillation requiring cardioversion who presented to the ED with complaints of worsening palpitations since last Thursday that was associated with exertional shortness of breath as well as weakness.  She was admitted with atrial fibrillation with RVR and remains on IV amiodarone drip per cardiology recommendations with plans for potential TEE/DCCV in a.m.   Subjective: The patient was seen and examined this morning, stable denies any chest pain, still complaining reporting of palpitation    Assessment & Plan:   Principal Problem:   Atrial fibrillation with RVR (HCC) Active Problems:   Essential hypertension   BMI 34.0-34.9,adult   Mitral valve replaced   S/P AVR (aortic valve replacement)  Assessment and Plan:  Atrial fibrillation with RVR -Continue Coumadin per pharmacy, currently therapeutic INR at 2.9 -Toprol-XL 25 mg daily  -Has been on IV  amiodarone -Cardiology following closely,-anticipating TEE cardioversion today 02/02/2023 N.p.o. overnight-  -Appreciate further cardiology recommendations  Hypomagnesemia -Repleted   Mitral/aortic valve replacement -Status post valve replacement at [facility] 09/2022 -Continue Coumadin as above   History of nonfamilial HOCM  -Status post septal myomectomy at [facility] 09/2022 -Postoperative symptomatic bradycardia status post pacemaker placement   Essential hypertension -Continue on Toprol-XL while on amiodarone drip for now -Titrating for better BP control   Type 2 diabetes -Maintain on SSI -Hold home oral agents -Carb modified diet   CKD stage IIIb -Previously  noted to have creatinine 1.7 Lab Results  Component Value Date   CREATININE 1.20 (H) 02/02/2023   CREATININE 1.38 (H) 02/01/2023   CREATININE 1.53 (H) 01/31/2023      Obesity -BMI 34.97    DVT prophylaxis:Warfarin Code Status: DNR Family Communication: None at bedside Disposition Plan: Continue management for heart rate control Status is: Inpatient Remains inpatient appropriate because: Need for IV medications.   Consultants:  Cardiology  Procedures:  None  Antimicrobials:  None    Objective: Vitals:   02/02/23 1200 02/02/23 1213 02/02/23 1249 02/02/23 1303  BP:   (!) 132/111 (!) 140/102  Pulse:   (!) 114 (!) 117  Resp: (!) 36  (!) 32 (!) 27  Temp:  97.8 F (36.6 C) (!) 97.4 F (36.3 C)   TempSrc:  Oral    SpO2: 96%  97% 96%  Weight:      Height:        Intake/Output Summary (Last 24 hours) at 02/02/2023 1318 Last data filed at 02/02/2023 0500 Gross per 24 hour  Intake 505.52 ml  Output --  Net 505.52 ml   Filed Weights   01/31/23 1627 02/01/23 0500 02/02/23 0500  Weight: 100.3 kg 100.3 kg 101.2 kg         General:  AAO x 3,  cooperative, no distress;   HEENT:  Normocephalic, PERRL, otherwise with in Normal limits   Neuro:  CNII-XII intact. , normal motor and sensation, reflexes intact   Lungs:   Clear to auscultation BL, Respirations unlabored,  No wheezes / crackles  Cardio:    S1/S, irregularly irregular,  no murmure, No Rubs or Gallops   Abdomen:  Soft, non-tender, bowel sounds active all four quadrants, no guarding or peritoneal signs.  Muscular  skeletal:  Limited exam -global generalized weaknesses - in bed, able to move all 4 extremities,   2+ pulses,  symmetric, No pitting edema  Skin:  Dry, warm to touch, negative for any Rashes,  Wounds: Please see nursing documentation           Data Reviewed: I have personally reviewed following labs and imaging studies  CBC: Recent Labs  Lab 01/31/23 1230 02/01/23 0447  02/02/23 0457  WBC 14.2* 12.0* 11.8*  NEUTROABS 10.4*  --   --   HGB 12.4 10.7* 10.8*  HCT 39.4 35.1* 35.4*  MCV 92.3 92.9 92.4  PLT 395 323 309   Basic Metabolic Panel: Recent Labs  Lab 01/31/23 1230 01/31/23 1427 02/01/23 0447 02/02/23 0457  NA 135  --  137 136  K 4.1 4.7 4.1 3.8  CL 103  --  102 103  CO2 18*  --  23 22  GLUCOSE 224*  --  149* 161*  BUN 37*  --  34* 28*  CREATININE 1.53*  --  1.38* 1.20*  CALCIUM 8.9  --  8.7* 8.3*  MG 1.3*  --  1.3* 1.9    Coagulation Profile: Recent Labs  Lab 01/27/23 1342 01/31/23 1231 02/01/23 0447 02/02/23 0457  INR 2.1 2.2* 2.5* 2.9*   Cardiac Enzymes: No results for input(s): "CKTOTAL", "CKMB", "CKMBINDEX", "TROPONINI" in the last 168 hours. BNP (last 3 results) No results for input(s): "PROBNP" in the last 8760 hours. HbA1C: Recent Labs    01/31/23 1230  HGBA1C 7.5*   CBG: Recent Labs  Lab 02/01/23 1616 02/01/23 2055 02/02/23 0735 02/02/23 1211 02/02/23 1303  GLUCAP 171* 171* 172* 147* 147*   Lipid Profile: No results for input(s): "CHOL", "HDL", "LDLCALC", "TRIG", "CHOLHDL", "LDLDIRECT" in the last 72 hours. Thyroid Function Tests: Recent Labs    01/31/23 1427  TSH 2.386     Recent Results (from the past 240 hour(s))  MRSA Next Gen by PCR, Nasal     Status: None   Collection Time: 01/31/23  4:14 PM   Specimen: Nasal Mucosa; Nasal Swab  Result Value Ref Range Status   MRSA by PCR Next Gen NOT DETECTED NOT DETECTED Final    Comment: (NOTE) The GeneXpert MRSA Assay (FDA approved for NASAL specimens only), is one component of a comprehensive MRSA colonization surveillance program. It is not intended to diagnose MRSA infection nor to guide or monitor treatment for MRSA infections. Test performance is not FDA approved in patients less than 34 years old. Performed at [facility], [address], [place], [place] [address]          Radiology Studies: No results found.      Scheduled  Meds:  chlorhexidine  15 mL Mouth/Throat Once   Or   mouth rinse  15 mL Mouth Rinse Once   [MAR Hold] Chlorhexidine Gluconate Cloth  6 each Topical Q0600   [MAR Hold] ezetimibe  10 mg Oral Daily   [MAR Hold] influenza vaccine adjuvanted  0.5 mL Intramuscular Tomorrow-1000   [MAR Hold] insulin aspart  0-5 Units Subcutaneous QHS   [MAR Hold] insulin aspart  0-9 Units Subcutaneous TID WC   [MAR Hold] metoprolol succinate  25 mg Oral Daily   [MAR Hold] pantoprazole  40 mg Oral Daily   [MAR Hold] pneumococcal 20-valent conjugate vaccine  0.5 mL Intramuscular Tomorrow-1000   [MAR Hold] warfarin  3 mg Oral ONCE-1600   [MAR Hold] Warfarin - Pharmacist Dosing Inpatient   Does not apply  q1600   Continuous Infusions:  amiodarone 30 mg/hr (02/02/23 0500)   lactated ringers       LOS: 2 days    Critical care time spent: 55 minutes    Kendell Bane, MD  [facility]  If 7PM-7AM, please contact night-coverage [email or web address] 02/02/2023, 1:18 PM

## 2023-02-02 NOTE — Progress Notes (Signed)
PHARMACY - ANTICOAGULATION CONSULT NOTE  Pharmacy Consult for warfarin Indication: atrial fibrillation, s/p mitral and aortic valve replacement  Allergies  Allergen Reactions   Nystatin Other (See Comments)    unknown   Other Other (See Comments)    Product Containing 3-Hydroxy-3-Methylglutaryl-Coenzyme A Reductase Inhibitor (Product) unknown   Statins Other (See Comments)    Muscles aches,hurt    Patient Measurements: Height: 5\' 8"  (172.7 cm) Weight: 101.2 kg (223 lb 1.7 oz) IBW/kg (Calculated) : 63.9  Vital Signs: Temp: 99.1 F (37.3 C) (10/09 0737) Temp Source: Oral (10/09 0737) BP: 150/120 (10/09 0500) Pulse Rate: 67 (10/09 0500)  Labs: Recent Labs    01/31/23 1230 01/31/23 1231 02/01/23 0447 02/02/23 0457  HGB 12.4  --  10.7* 10.8*  HCT 39.4  --  35.1* 35.4*  PLT 395  --  323 309  LABPROT  --  25.1* 26.9* 30.1*  INR  --  2.2* 2.5* 2.9*  CREATININE 1.53*  --  1.38* 1.20*    Estimated Creatinine Clearance: 51.2 mL/min (A) (by C-G formula based on SCr of 1.2 mg/dL (H)).   Medical History: Past Medical History:  Diagnosis Date   Aortic stenosis    21 mm Inspiris Edwards AVR June 2024 - [facility]   Arthritis    Cervical incompetence    GERD (gastroesophageal reflux disease)    Hay fever    Heart block    [employer] DCPPM on 10/26/2022 with Dr. Zachery Conch - [facility]   Hemochromatosis    HOCM (hypertrophic obstructive cardiomyopathy) [facility])    Septal myomectomy June 2024 - [facility]   Hypertension    Migraines    Mitral stenosis    25 mm Regent mechanical MVR June 2024 - [facility]   Paroxysmal atrial fibrillation (HCC)    Sciatica    Type 2 diabetes mellitus [facility])     Assessment: 72 year old female with recent valve replacement at [facility] presents to [facility] after running out of meds and having palpitations since last week. Home dose listed as 1.5 mg on Saturday and 3 mg ROW.  INR therapeutic at 2.9  Goal of Therapy:  INR goal 2.5-3.5 Monitor platelets by  anticoagulation protocol: Yes   Plan:  Warfarin 3 mg tonight Daily INR  Judeth Cornfield, PharmD Clinical Pharmacist 02/02/2023 8:37 AM

## 2023-02-02 NOTE — Anesthesia Preprocedure Evaluation (Signed)
Anesthesia Evaluation  Patient identified by MRN, date of birth, ID band Patient awake    Reviewed: Allergy & Precautions, H&P , NPO status , Patient's Chart, lab work & pertinent test results, reviewed documented beta blocker date and time   Airway Mallampati: II  TM Distance: >3 FB Neck ROM: full    Dental no notable dental hx.    Pulmonary neg pulmonary ROS, former smoker   Pulmonary exam normal breath sounds clear to auscultation       Cardiovascular Exercise Tolerance: Good hypertension, negative cardio ROS + dysrhythmias Atrial Fibrillation  Rhythm:regular Rate:Normal     Neuro/Psych  Headaches  Neuromuscular disease negative neurological ROS  negative psych ROS   GI/Hepatic negative GI ROS, Neg liver ROS,GERD  ,,  Endo/Other  negative endocrine ROSdiabetes    Renal/GU negative Renal ROS  negative genitourinary   Musculoskeletal   Abdominal   Peds  Hematology negative hematology ROS (+)   Anesthesia Other Findings   Reproductive/Obstetrics negative OB ROS                             Anesthesia Physical Anesthesia Plan  ASA: 3 and emergent  Anesthesia Plan: General   Post-op Pain Management:    Induction:   PONV Risk Score and Plan: Propofol infusion  Airway Management Planned:   Additional Equipment:   Intra-op Plan:   Post-operative Plan:   Informed Consent: I have reviewed the patients History and Physical, chart, labs and discussed the procedure including the risks, benefits and alternatives for the proposed anesthesia with the patient or authorized representative who has indicated his/her understanding and acceptance.     Dental Advisory Given  Plan Discussed with: CRNA  Anesthesia Plan Comments:        Anesthesia Quick Evaluation

## 2023-02-02 NOTE — CV Procedure (Addendum)
TRANSESOPHAGEAL ECHOCARDIOGRAM GUIDED DIRECT CURRENT CARDIOVERSION  NAME:  [name]    MRN: [id number] DOB:  Nov 14, 1950    ADMIT DATE: 01/31/2023  INDICATIONS: Atrial flutter with RVR  PROCEDURE:  Informed consent was obtained prior to the procedure. Once the TEE was complete, the patient had the defibrillator pads placed in the anterior and posterior position. Once an appropriate level of sedation was confirmed, the patient was cardioverted x 1 with 120J of biphasic synchronized energy. The patient converted to NSR. There were no apparent complications. The patient had normal neuro status and respiratory status post procedure with vitals stable as recorded elsewhere.  Adequate airway was maintained throughout and vital signs monitored per protocol.  FINDINGS: No LAA thrombus.  RECOMMENDATIONS: Post DCCV EKG showed AV paced rhyth, HR 60s. Stop Amidoarone drip and start p.o amiodarone 200 mg BID x 3 weeks followed by amiodarone 200 mg once daily. Continue metoprolol succinate 25 mg once daily. Continue current Coumadin dosing.  [name], MD [place]  [facility]  1:57 PM

## 2023-02-02 NOTE — Transfer of Care (Signed)
Immediate Anesthesia Transfer of Care Note  Patient: [name]  Procedure(s) Performed: CARDIOVERSION TRANSESOPHAGEAL ECHOCARDIOGRAM (TEE)  Patient Location: PACU  Anesthesia Type:General  Level of Consciousness: awake  Airway & Oxygen Therapy: Patient Spontanous Breathing and Patient connected to nasal cannula oxygen  Post-op Assessment: Report given to RN and Post -op Vital signs reviewed and stable  Post vital signs: Reviewed and stable  Last Vitals:  Vitals Value Taken Time  BP    Temp    Pulse    Resp    SpO2      Last Pain:  Vitals:   02/02/23 1249  TempSrc:   PainSc: 0-No pain      Patients Stated Pain Goal: 0 (02/02/23 0400)  Complications: No notable events documented.

## 2023-02-02 NOTE — Progress Notes (Signed)
*  PRELIMINARY RESULTS* Echocardiogram Echocardiogram Transesophageal has been performed.  Stacey Drain 02/02/2023, 2:04 PM

## 2023-02-03 ENCOUNTER — Encounter: Payer: Self-pay | Admitting: Cardiovascular Disease

## 2023-02-03 ENCOUNTER — Encounter: Payer: Self-pay | Admitting: Internal Medicine

## 2023-02-03 NOTE — Telephone Encounter (Signed)
Error

## 2023-02-04 ENCOUNTER — Other Ambulatory Visit: Payer: Self-pay

## 2023-02-04 MED ORDER — POTASSIUM CHLORIDE ER 20 MEQ PO TBCR: 1.0000 | EXTENDED_RELEASE_TABLET | Freq: Every day | ORAL | 2 refills | Status: DC

## 2023-02-05 NOTE — Anesthesia Postprocedure Evaluation (Signed)
Anesthesia Post Note  Patient: [name]  Procedure(s) Performed: CARDIOVERSION TRANSESOPHAGEAL ECHOCARDIOGRAM (TEE)  Patient location during evaluation: Phase II Anesthesia Type: General Level of consciousness: awake Pain management: pain level controlled Vital Signs Assessment: post-procedure vital signs reviewed and stable Respiratory status: spontaneous breathing and respiratory function stable Cardiovascular status: blood pressure returned to baseline and stable Postop Assessment: no headache and no apparent nausea or vomiting Anesthetic complications: no Comments: Late entry   No notable events documented.   Last Vitals:  Vitals:   02/02/23 1414 02/02/23 1415  BP: 127/64 (!) 124/98  Pulse: 60 (!) 59  Resp: 20 20  Temp: 36.7 C   SpO2: 98% 98%    Last Pain:  Vitals:   02/02/23 1500  TempSrc:   PainSc: 0-No pain                 Windell Norfolk

## 2023-02-07 DIAGNOSIS — I480 Paroxysmal atrial fibrillation: Secondary | ICD-10-CM | POA: Diagnosis not present

## 2023-02-07 DIAGNOSIS — I4719 Other supraventricular tachycardia: Secondary | ICD-10-CM | POA: Diagnosis not present

## 2023-02-07 DIAGNOSIS — I340 Nonrheumatic mitral (valve) insufficiency: Secondary | ICD-10-CM | POA: Diagnosis not present

## 2023-02-07 DIAGNOSIS — Z952 Presence of prosthetic heart valve: Secondary | ICD-10-CM | POA: Diagnosis not present

## 2023-02-07 DIAGNOSIS — Z23 Encounter for immunization: Secondary | ICD-10-CM | POA: Diagnosis not present

## 2023-02-07 DIAGNOSIS — T8209XA Other mechanical complication of heart valve prosthesis, initial encounter: Secondary | ICD-10-CM | POA: Diagnosis not present

## 2023-02-08 ENCOUNTER — Encounter (HOSPITAL_COMMUNITY): Payer: Self-pay | Admitting: Internal Medicine

## 2023-02-08 ENCOUNTER — Ambulatory Visit: Payer: Medicare Other | Admitting: Cardiology

## 2023-02-08 ENCOUNTER — Ambulatory Visit: Payer: Medicare Other | Attending: Internal Medicine | Admitting: Internal Medicine

## 2023-02-08 VITALS — BP 142/80 | HR 60 | Ht 68.0 in | Wt 222.8 lb

## 2023-02-08 DIAGNOSIS — I442 Atrioventricular block, complete: Secondary | ICD-10-CM | POA: Diagnosis not present

## 2023-02-08 NOTE — Progress Notes (Signed)
HPI [name] is referred today to establish from [name] at [facility]. She is a pleasant 72 yo woman s/p replacement of her mitral and aortic valves, s/p PPM insertion, s/p myomectomy back in July. She has had some post op afib and was cardioverted last week. She has gradually felt better since her heart surgery.  Allergies  Allergen Reactions   Nystatin Other (See Comments)    unknown   Other Other (See Comments)    Product Containing 3-Hydroxy-3-Methylglutaryl-Coenzyme A Reductase Inhibitor (Product) unknown   Statins Other (See Comments)    Muscles aches,hurt     Current Outpatient Medications  Medication Sig Dispense Refill   amiodarone (PACERONE) 200 MG tablet Take 1 tablet (200 mg total) by mouth 2 (two) times daily for 21 days, THEN 1 tablet (200 mg total) daily. Amiodarone 200 mg BID x 3 weeks followed by amiodarone 200 mg once daily.. 72 tablet 0   ezetimibe (ZETIA) 10 MG tablet Take 10 mg by mouth daily.     losartan (COZAAR) 25 MG tablet Take 1 tablet by mouth daily.     magnesium oxide (MAG-OX) 400 (240 Mg) MG tablet Take 1 tablet by mouth daily.     melatonin 1 MG TABS tablet Take 4 mg by mouth at bedtime as needed (sleep). gummies     metFORMIN (GLUCOPHAGE) 1000 MG tablet Take 1,000 mg by mouth 2 (two) times daily.     metoprolol succinate (TOPROL-XL) 25 MG 24 hr tablet Take 1 tablet (25 mg total) by mouth daily. 30 tablet 2   nitrofurantoin (MACRODANTIN) 50 MG capsule Take 50 mg by mouth daily.     omeprazole (PRILOSEC) 20 MG capsule Take 20 mg by mouth daily.   3   Potassium Chloride ER 20 MEQ TBCR Take 1 tablet (20 mEq total) by mouth daily. 60 tablet 2   warfarin (COUMADIN) 3 MG tablet Take 1/2 a tablet to 1 tablet by mouth daily as directed by the coumadin clinic. 30 tablet 0   No current facility-administered medications for this visit.     Past Medical History:  Diagnosis Date   Aortic stenosis    21 mm Inspiris Edwards AVR June 2024 - [facility]    Arthritis    Cervical incompetence    GERD (gastroesophageal reflux disease)    Hay fever    Heart block    [employer] DCPPM on 10/26/2022 with Dr. Zachery Conch - [facility]   Hemochromatosis    HOCM (hypertrophic obstructive cardiomyopathy) [facility])    Septal myomectomy June 2024 - [facility]   Hypertension    Migraines    Mitral stenosis    25 mm Regent mechanical MVR June 2024 - [facility]   Paroxysmal atrial fibrillation (HCC)    Sciatica    Type 2 diabetes mellitus (HCC)     ROS:   All systems reviewed and negative except as noted in the HPI.   Past Surgical History:  Procedure Laterality Date   CARDIAC SURGERY     CARDIOVERSION N/A 02/02/2023   Procedure: CARDIOVERSION;  Surgeon: Marjo Bicker, MD;  Location: [facility] ORS;  Service: Cardiovascular;  Laterality: N/A;   CARPAL TUNNEL RELEASE Right    COLONOSCOPY N/A 08/01/2014   Procedure: COLONOSCOPY;  Surgeon: Malissa Hippo, MD;  Location: [facility] ENDO SUITE;  Service: Endoscopy;  Laterality: N/A;  900 -- moved to 10:00 - Ann notified pt   ELBOW SURGERY     PACEMAKER INSERTION     June  2024   RIGHT/LEFT HEART CATH AND CORONARY ANGIOGRAPHY N/A 07/28/2020   Procedure: RIGHT/LEFT HEART CATH AND CORONARY ANGIOGRAPHY;  Surgeon: Tonny Bollman, MD;  Location: [facility] INVASIVE CV LAB;  Service: Cardiovascular;  Laterality: N/A;   ROTATOR CUFF REPAIR Right 2012   TEE WITHOUT CARDIOVERSION N/A 02/02/2023   Procedure: TRANSESOPHAGEAL ECHOCARDIOGRAM (TEE);  Surgeon: Marjo Bicker, MD;  Location: [facility] ORS;  Service: Cardiovascular;  Laterality: N/A;   TONSILLECTOMY     TOTAL KNEE ARTHROPLASTY Right 10/13/2021   Procedure: TOTAL KNEE ARTHROPLASTY;  Surgeon: Durene Romans, MD;  Location: [facility] ORS;  Service: Orthopedics;  Laterality: Right;   TUBAL LIGATION     WISDOM TOOTH EXTRACTION       Family History  Problem Relation Age of Onset   Heart attack Paternal Grandfather    Migraines Maternal Grandmother    Heart attack Maternal Grandfather     Heart attack Father    Heart murmur Father    Cancer Mother        pancreatic cancer   Heart murmur Brother    Breast cancer Maternal Aunt      Social History   Socioeconomic History   Marital status: Married    Spouse name: Not on file   Number of children: Not on file   Years of education: Not on file   Highest education level: Not on file  Occupational History   Not on file  Tobacco Use   Smoking status: Former    Current packs/day: 0.00    Average packs/day: 0.1 packs/day for 7.0 years (0.7 ttl pk-yrs)    Types: Cigarettes    Start date: 09/16/1962    Quit date: 09/15/1969    Years since quitting: 53.4   Smokeless tobacco: Never   Tobacco comments:    as a teenager  Vaping Use   Vaping status: Never Used  Substance and Sexual Activity   Alcohol use: Not Currently   Drug use: No   Sexual activity: Not Currently    Partners: Female    Birth control/protection: Post-menopausal    Comment: BTL  Other Topics Concern   Not on file  Social History Narrative   Not on file   Social Determinants of Health   Financial Resource Strain: Not on file  Food Insecurity: No Food Insecurity (01/31/2023)   Hunger Vital Sign    Worried About Running Out of Food in the Last Year: Never true    Ran Out of Food in the Last Year: Never true  Transportation Needs: No Transportation Needs (01/31/2023)   PRAPARE - Administrator, Civil Service (Medical): No    Lack of Transportation (Non-Medical): No  Physical Activity: Not on file  Stress: Not on file  Social Connections: Not on file  Intimate Partner Violence: Not At Risk (01/31/2023)   Humiliation, Afraid, Rape, and Kick questionnaire    Fear of Current or Ex-Partner: No    Emotionally Abused: No    Physically Abused: No    Sexually Abused: No     BP (!) 142/80   Pulse 60   Ht 5\' 8"  (1.727 m)   Wt 222 lb 12.8 oz (101.1 kg)   LMP 04/26/1998 (Within Years)   SpO2 95%   BMI 33.88 kg/m   Physical Exam:  Well  appearing NAD HEENT: Unremarkable Neck:  No JVD, no thyromegally Lymphatics:  No adenopathy Back:  No CVA tenderness Lungs:  Clear HEART:  Regular rate rhythm, 2/6 systolic murmurs, mechanical S1,no rubs,  no clicks Abd:  soft, positive bowel sounds, no organomegally, no rebound, no guarding Ext:  2 plus pulses, 1+ edema, no cyanosis, no clubbing Skin:  No rashes no nodules Neuro:  CN II through XII intact, motor grossly intact  DEVICE  Normal device function.  See PaceArt for details.   Assess/Plan: CHB - she had a slow escape in the 30's today. And she is asymptomatic s/p PPM. 2. Sinus node dysfunction - she had no atrial escape. She is pacing in the atrium. 3. PAF - she is maintaining NSR on amiodarone. 4. Coags - she will continue coumadin and followed by our clinic.   Linda Gowda Dauntae Derusha,MD

## 2023-02-08 NOTE — Patient Instructions (Signed)
Medication Instructions:  Your physician recommends that you continue on your current medications as directed. Please refer to the Current Medication list given to you today.  *If you need a refill on your cardiac medications before your next appointment, please call your pharmacy*   Lab Work: NONE   If you have labs (blood work) drawn today and your tests are completely normal, you will receive your results only by: MyChart Message (if you have MyChart) OR A paper copy in the mail If you have any lab test that is abnormal or we need to change your treatment, we will call you to review the results.   Testing/Procedures: NONE    Follow-Up: At [facility], you and your health needs are our priority.  As part of our continuing mission to provide you with exceptional heart care, we have created designated Provider Care Teams.  These Care Teams include your primary Cardiologist (physician) and Advanced Practice Providers (APPs -  Physician Assistants and Nurse Practitioners) who all work together to provide you with the care you need, when you need it.  We recommend signing up for the patient portal called "MyChart".  Sign up information is provided on this After Visit Summary.  MyChart is used to connect with patients for Virtual Visits (Telemedicine).  Patients are able to view lab/test results, encounter notes, upcoming appointments, etc.  Non-urgent messages can be sent to your provider as well.   To learn more about what you can do with MyChart, go to [email or web address].    Your next appointment:   1 year(s)  Provider:   Gregg Taylor, MD    Other Instructions Thank you for choosing [facility]!    

## 2023-02-09 ENCOUNTER — Ambulatory Visit: Payer: Medicare Other | Attending: Cardiovascular Disease | Admitting: *Deleted

## 2023-02-09 DIAGNOSIS — Z5181 Encounter for therapeutic drug level monitoring: Secondary | ICD-10-CM | POA: Diagnosis not present

## 2023-02-09 DIAGNOSIS — Z952 Presence of prosthetic heart valve: Secondary | ICD-10-CM

## 2023-02-09 LAB — POCT INR: INR: 2.7 (ref 2.0–3.0)

## 2023-02-09 NOTE — Patient Instructions (Addendum)
S/P TEE/DCCV on 02/02/23 Continue warfarin 1 tablet daily except 1/2 tablet on Saturdays On amiodarone 200mg  bid x 3 wks then 249m daily  Recheck in 1 wk

## 2023-02-10 ENCOUNTER — Encounter: Payer: Self-pay | Admitting: Internal Medicine

## 2023-02-10 LAB — ECHO TEE

## 2023-02-11 ENCOUNTER — Telehealth: Payer: Self-pay

## 2023-02-11 NOTE — Telephone Encounter (Signed)
I spoke with the patient and her monitor is now working.

## 2023-02-16 ENCOUNTER — Ambulatory Visit: Payer: Medicare Other | Attending: Cardiovascular Disease | Admitting: *Deleted

## 2023-02-16 DIAGNOSIS — Z952 Presence of prosthetic heart valve: Secondary | ICD-10-CM | POA: Diagnosis not present

## 2023-02-16 DIAGNOSIS — Z5181 Encounter for therapeutic drug level monitoring: Secondary | ICD-10-CM | POA: Diagnosis not present

## 2023-02-16 LAB — POCT INR: INR: 2.8 (ref 2.0–3.0)

## 2023-02-16 NOTE — Patient Instructions (Signed)
S/P TEE/DCCV on 02/02/23 Continue warfarin 1 tablet daily except 1/2 tablet on Saturdays On amiodarone 200mg  bid x 3 wks then 249m daily  Recheck in 1 wk

## 2023-02-18 NOTE — Plan of Care (Signed)
 [facility] Tonsillectomy/Adenoidectomy, Postoperative PEDS care plan entered in error.

## 2023-02-23 ENCOUNTER — Ambulatory Visit: Payer: Medicare Other | Attending: Cardiovascular Disease | Admitting: *Deleted

## 2023-02-23 DIAGNOSIS — Z952 Presence of prosthetic heart valve: Secondary | ICD-10-CM

## 2023-02-23 DIAGNOSIS — Z5181 Encounter for therapeutic drug level monitoring: Secondary | ICD-10-CM | POA: Diagnosis not present

## 2023-02-23 LAB — POCT INR: INR: 3 (ref 2.0–3.0)

## 2023-02-23 NOTE — Patient Instructions (Signed)
S/P TEE/DCCV on 02/02/23 Continue warfarin 1 tablet daily except 1/2 tablet on Saturdays On amiodarone 200mg  daily Recheck in 3 wks

## 2023-03-04 DIAGNOSIS — L304 Erythema intertrigo: Secondary | ICD-10-CM | POA: Diagnosis not present

## 2023-03-07 ENCOUNTER — Encounter: Payer: Self-pay | Admitting: Internal Medicine

## 2023-03-14 ENCOUNTER — Ambulatory Visit: Payer: Medicare Other | Attending: Cardiovascular Disease | Admitting: *Deleted

## 2023-03-14 DIAGNOSIS — Z5181 Encounter for therapeutic drug level monitoring: Secondary | ICD-10-CM

## 2023-03-14 DIAGNOSIS — Z952 Presence of prosthetic heart valve: Secondary | ICD-10-CM | POA: Diagnosis not present

## 2023-03-14 LAB — POCT INR: INR: 3.7 — AB (ref 2.0–3.0)

## 2023-03-14 NOTE — Patient Instructions (Signed)
S/P TEE/DCCV on 02/02/23 Take warfarin 1/2 tablet tonight then decrease dose to 1/2 tablet daily except 1 tablet on Mondays, Wednesdays and Fridays On amiodarone 200mg  daily Recheck in 1 wk

## 2023-03-22 ENCOUNTER — Ambulatory Visit: Payer: Medicare Other | Attending: Cardiovascular Disease | Admitting: *Deleted

## 2023-03-22 DIAGNOSIS — Z952 Presence of prosthetic heart valve: Secondary | ICD-10-CM

## 2023-03-22 DIAGNOSIS — Z5181 Encounter for therapeutic drug level monitoring: Secondary | ICD-10-CM | POA: Diagnosis not present

## 2023-03-22 LAB — POCT INR: INR: 3.1 — AB (ref 2.0–3.0)

## 2023-03-22 NOTE — Patient Instructions (Signed)
S/P TEE/DCCV on 02/02/23 Continue warfarin 1/2 tablet daily except 1 tablet on Mondays, Wednesdays and Fridays On amiodarone 200mg  daily Recheck in 2 wks

## 2023-03-23 ENCOUNTER — Other Ambulatory Visit: Payer: Self-pay

## 2023-03-23 ENCOUNTER — Encounter: Payer: Self-pay | Admitting: Internal Medicine

## 2023-03-23 MED ORDER — AMIODARONE HCL 200 MG PO TABS
200.0000 mg | ORAL_TABLET | Freq: Every day | ORAL | 3 refills | Status: DC
Start: 2023-03-23 — End: 2023-09-15

## 2023-03-26 DIAGNOSIS — E782 Mixed hyperlipidemia: Secondary | ICD-10-CM | POA: Diagnosis not present

## 2023-03-26 DIAGNOSIS — E1159 Type 2 diabetes mellitus with other circulatory complications: Secondary | ICD-10-CM | POA: Diagnosis not present

## 2023-03-26 DIAGNOSIS — G720 Drug-induced myopathy: Secondary | ICD-10-CM | POA: Diagnosis not present

## 2023-03-29 ENCOUNTER — Encounter: Payer: Self-pay | Admitting: Internal Medicine

## 2023-04-05 ENCOUNTER — Ambulatory Visit: Payer: Medicare Other | Attending: Cardiovascular Disease | Admitting: *Deleted

## 2023-04-05 ENCOUNTER — Other Ambulatory Visit: Payer: Self-pay

## 2023-04-05 DIAGNOSIS — Z952 Presence of prosthetic heart valve: Secondary | ICD-10-CM

## 2023-04-05 DIAGNOSIS — I421 Obstructive hypertrophic cardiomyopathy: Secondary | ICD-10-CM

## 2023-04-05 DIAGNOSIS — Z5181 Encounter for therapeutic drug level monitoring: Secondary | ICD-10-CM

## 2023-04-05 DIAGNOSIS — I442 Atrioventricular block, complete: Secondary | ICD-10-CM

## 2023-04-05 LAB — POCT INR: INR: 2.1 (ref 2.0–3.0)

## 2023-04-05 NOTE — Patient Instructions (Signed)
S/P TEE/DCCV on 02/02/23 Take warfarin 1 tablet tonight then resume 1/2 tablet daily except 1 tablet on Mondays, Wednesdays and Fridays On amiodarone 200mg  daily Recheck in 4 wks

## 2023-04-08 ENCOUNTER — Encounter (HOSPITAL_COMMUNITY): Payer: Self-pay

## 2023-04-18 ENCOUNTER — Encounter (HOSPITAL_COMMUNITY)
Admission: RE | Admit: 2023-04-18 | Discharge: 2023-04-18 | Disposition: A | Discharge status: Home / Self Care | Payer: Medicare Other | Source: Ambulatory Visit | Attending: Internal Medicine | Admitting: Internal Medicine

## 2023-04-18 VITALS — Ht 68.0 in | Wt 229.9 lb

## 2023-04-18 DIAGNOSIS — Z952 Presence of prosthetic heart valve: Secondary | ICD-10-CM | POA: Diagnosis not present

## 2023-04-18 DIAGNOSIS — Z950 Presence of cardiac pacemaker: Secondary | ICD-10-CM | POA: Insufficient documentation

## 2023-04-18 NOTE — Patient Instructions (Signed)
Patient Instructions  Patient Details  Name: [name] MRN: [id number] Date of Birth: 02-Dec-1950 Referring Provider:  Marjo Bicker, MD  Below are your personal goals for exercise, nutrition, and risk factors. Our goal is to help you stay on track towards obtaining and maintaining these goals. We will be discussing your progress on these goals with you throughout the program.  Initial Exercise Prescription:  Initial Exercise Prescription - 04/18/23 1400       Date of Initial Exercise RX and Referring Provider   Date 04/18/23    Referring Provider Luane School MD      Oxygen   Maintain Oxygen Saturation 88% or higher      NuStep   Level 1    SPM 60    Minutes 30    METs 1.5      Prescription Details   Frequency (times per week) 2    Duration Progress to 30 minutes of continuous aerobic without signs/symptoms of physical distress      Intensity   THRR 40-80% of Max Heartrate 96-129    Ratings of Perceived Exertion 11-13    Perceived Dyspnea 0-4      Progression   Progression Continue to progress workloads to maintain intensity without signs/symptoms of physical distress.      Resistance Training   Training Prescription Yes    Weight 2lb    Reps 10-15             Exercise Goals: Frequency: Be able to perform aerobic exercise two to three times per week in program working toward 2-5 days per week of home exercise.  Intensity: Work with a perceived exertion of 11 (fairly light) - 15 (hard) while following your exercise prescription.  We will make changes to your prescription with you as you progress through the program.   Duration: Be able to do 30 to 45 minutes of continuous aerobic exercise in addition to a 5 minute warm-up and a 5 minute cool-down routine.   Nutrition Goals: Your personal nutrition goals will be established when you do your nutrition analysis with the dietician.  The following are general nutrition guidelines to  follow: Cholesterol < 200mg /day Sodium < 1500mg /day Fiber: Women over 50 yrs - 21 grams per day  Personal Goals:  Personal Goals and Risk Factors at Admission - 04/18/23 1412       Core Components/Risk Factors/Patient Goals on Admission    Weight Management Yes;Obesity;Weight Loss    Intervention Weight Management: Develop a combined nutrition and exercise program designed to reach desired caloric intake, while maintaining appropriate intake of nutrient and fiber, sodium and fats, and appropriate energy expenditure required for the weight goal.;Weight Management: Provide education and appropriate resources to help participant work on and attain dietary goals.;Weight Management/Obesity: Establish reasonable short term and long term weight goals.;Obesity: Provide education and appropriate resources to help participant work on and attain dietary goals.    Admit Weight 229 lb 14.4 oz (104.3 kg)    Goal Weight: Short Term 225 lb (102.1 kg)    Goal Weight: Long Term 220 lb (99.8 kg)    Expected Outcomes Short Term: Continue to assess and modify interventions until short term weight is achieved;Long Term: Adherence to nutrition and physical activity/exercise program aimed toward attainment of established weight goal;Weight Loss: Understanding of general recommendations for a balanced deficit meal plan, which promotes 1-2 lb weight loss per week and includes a negative energy balance of [phone] kcal/d;Understanding recommendations for meals to include 15-35%  energy as protein, 25-35% energy from fat, 35-60% energy from carbohydrates, less than 200mg  of dietary cholesterol, 20-35 gm of total fiber daily;Understanding of distribution of calorie intake throughout the day with the consumption of 4-5 meals/snacks    Improve shortness of breath with ADL's Yes    Intervention Provide education, individualized exercise plan and daily activity instruction to help decrease symptoms of SOB with activities of daily  living.    Expected Outcomes Short Term: Improve cardiorespiratory fitness to achieve a reduction of symptoms when performing ADLs;Long Term: Be able to perform more ADLs without symptoms or delay the onset of symptoms    Diabetes Yes    Intervention Provide education about signs/symptoms and action to take for hypo/hyperglycemia.;Provide education about proper nutrition, including hydration, and aerobic/resistive exercise prescription along with prescribed medications to achieve blood glucose in normal ranges: Fasting glucose 65-99 mg/dL    Expected Outcomes Short Term: Participant verbalizes understanding of the signs/symptoms and immediate care of hyper/hypoglycemia, proper foot care and importance of medication, aerobic/resistive exercise and nutrition plan for blood glucose control.;Long Term: Attainment of HbA1C < 7%.    Hypertension Yes    Intervention Provide education on lifestyle modifcations including regular physical activity/exercise, weight management, moderate sodium restriction and increased consumption of fresh fruit, vegetables, and low fat dairy, alcohol moderation, and smoking cessation.;Monitor prescription use compliance.    Expected Outcomes Short Term: Continued assessment and intervention until BP is < 140/81mm HG in hypertensive participants. < 130/93mm HG in hypertensive participants with diabetes, heart failure or chronic kidney disease.;Long Term: Maintenance of blood pressure at goal levels.             Tobacco Use Initial Evaluation: Social History   Tobacco Use  Smoking Status Former   Current packs/day: 0.00   Average packs/day: 0.1 packs/day for 7.0 years (0.7 ttl pk-yrs)   Types: Cigarettes   Start date: 09/16/1962   Quit date: 09/15/1969   Years since quitting: 53.6  Smokeless Tobacco Never  Tobacco Comments   as a teenager    Exercise Goals and Review:  Exercise Goals     Row Name 04/18/23 1405             Exercise Goals   Increase Physical  Activity Yes       Intervention Provide advice, education, support and counseling about physical activity/exercise needs.;Develop an individualized exercise prescription for aerobic and resistive training based on initial evaluation findings, risk stratification, comorbidities and participant's personal goals.       Expected Outcomes Short Term: Attend rehab on a regular basis to increase amount of physical activity.;Long Term: Add in home exercise to make exercise part of routine and to increase amount of physical activity.;Long Term: Exercising regularly at least 3-5 days a week.       Increase Strength and Stamina Yes       Intervention Provide advice, education, support and counseling about physical activity/exercise needs.;Develop an individualized exercise prescription for aerobic and resistive training based on initial evaluation findings, risk stratification, comorbidities and participant's personal goals.       Expected Outcomes Short Term: Increase workloads from initial exercise prescription for resistance, speed, and METs.;Short Term: Perform resistance training exercises routinely during rehab and add in resistance training at home;Long Term: Improve cardiorespiratory fitness, muscular endurance and strength as measured by increased METs and functional capacity ( )       Able to understand and use rate of perceived exertion (RPE) scale Yes  Intervention Provide education and explanation on how to use RPE scale       Expected Outcomes Short Term: Able to use RPE daily in rehab to express subjective intensity level;Long Term:  Able to use RPE to guide intensity level when exercising independently       Able to understand and use Dyspnea scale Yes       Intervention Provide education and explanation on how to use Dyspnea scale       Expected Outcomes Short Term: Able to use Dyspnea scale daily in rehab to express subjective sense of shortness of breath during exertion;Long Term: Able to  use Dyspnea scale to guide intensity level when exercising independently       Knowledge and understanding of Target Heart Rate Range (THRR) Yes       Intervention Provide education and explanation of THRR including how the numbers were predicted and where they are located for reference       Expected Outcomes Long Term: Able to use THRR to govern intensity when exercising independently;Short Term: Able to state/look up THRR;Short Term: Able to use daily as guideline for intensity in rehab       Able to check pulse independently Yes       Intervention Review the importance of being able to check your own pulse for safety during independent exercise;Provide education and demonstration on how to check pulse in carotid and radial arteries.       Expected Outcomes Short Term: Able to explain why pulse checking is important during independent exercise;Long Term: Able to check pulse independently and accurately       Understanding of Exercise Prescription Yes       Intervention Provide education, explanation, and written materials on patient's individual exercise prescription       Expected Outcomes Short Term: Able to explain program exercise prescription;Long Term: Able to explain home exercise prescription to exercise independently              Copy of goals given to participant.

## 2023-04-18 NOTE — Progress Notes (Signed)
Cardiac Individual Treatment Plan  Patient Details  Name: [name] MRN: [id number] Date of Birth: [birth year] Referring Provider:   Flowsheet Row CARDIAC REHAB PHASE II ORIENTATION from 04/18/2023 in [facility] CARDIAC REHABILITATION  Referring Provider Luane School MD       Initial Encounter Date:  Flowsheet Row CARDIAC REHAB PHASE II ORIENTATION from 04/18/2023 in [place] CARDIAC REHABILITATION  Date 04/18/23       Visit Diagnosis: S/P MVR (mitral valve replacement)  S/P AVR (aortic valve replacement)  S/P placement of cardiac pacemaker  Patient's Home Medications on Admission:  Current Outpatient Medications:    amiodarone (PACERONE) 200 MG tablet, Take 1 tablet (200 mg total) by mouth daily. Amiodarone 200 mg BID x 3 weeks followed by amiodarone 200 mg once daily., Disp: 90 tablet, Rfl: 3   ezetimibe (ZETIA) 10 MG tablet, Take 10 mg by mouth daily., Disp: , Rfl:    losartan (COZAAR) 25 MG tablet, Take 1 tablet by mouth daily., Disp: , Rfl:    magnesium oxide (MAG-OX) 400 (240 Mg) MG tablet, Take 1 tablet by mouth daily., Disp: , Rfl:    melatonin 1 MG TABS tablet, Take 4 mg by mouth at bedtime as needed (sleep). gummies, Disp: , Rfl:    metFORMIN (GLUCOPHAGE) 1000 MG tablet, Take 1,000 mg by mouth 2 (two) times daily., Disp: , Rfl:    metoprolol succinate (TOPROL-XL) 25 MG 24 hr tablet, Take 1 tablet (25 mg total) by mouth daily., Disp: 30 tablet, Rfl: 2   nitrofurantoin (MACRODANTIN) 50 MG capsule, Take 50 mg by mouth daily., Disp: , Rfl:    omeprazole (PRILOSEC) 20 MG capsule, Take 20 mg by mouth daily. , Disp: , Rfl: 3   Potassium Chloride ER 20 MEQ TBCR, Take 1 tablet (20 mEq total) by mouth daily., Disp: 60 tablet, Rfl: 2   warfarin (COUMADIN) 3 MG tablet, Take 1/2 a tablet to 1 tablet by mouth daily as directed by the coumadin clinic., Disp: 30 tablet, Rfl: 0  Past Medical History: Past Medical History:  Diagnosis Date   Aortic stenosis    21 mm  Inspiris Edwards AVR June 2024 - [facility]   Arthritis    Cervical incompetence    GERD (gastroesophageal reflux disease)    Hay fever    Heart block    [employer] DCPPM on 10/26/2022 with Dr. Zachery Conch - [facility]   Hemochromatosis    HOCM (hypertrophic obstructive cardiomyopathy) (HCC)    Septal myomectomy June 2024 - [facility]   Hypertension    Migraines    Mitral stenosis    25 mm [employer] mechanical MVR June 2024 - [facility]   Paroxysmal atrial fibrillation (HCC)    Sciatica    Type 2 diabetes mellitus (HCC)     Tobacco Use: Social History   Tobacco Use  Smoking Status Former   Current packs/day: 0.00   Average packs/day: 0.1 packs/day for 7.0 years (0.7 ttl pk-yrs)   Types: Cigarettes   Start date: 09/16/1962   Quit date: 09/15/1969   Years since quitting: 53.6  Smokeless Tobacco Never  Tobacco Comments   as a teenager    Labs: Review Flowsheet       Latest Ref Rng & Units 07/28/2020 10/01/2021 01/31/2023  Labs for ITP Cardiac and Pulmonary Rehab  Hemoglobin A1c 4.8 - 5.6 % - 6.8  7.5   Bicarbonate 20.0 - 28.0 mmol/L 27.7  - -  TCO2 22 - 32 mmol/L 29  - -  O2 Saturation %  75.0  - -    Capillary Blood Glucose: Lab Results  Component Value Date   GLUCAP 147 (H) 02/02/2023   GLUCAP 147 (H) 02/02/2023   GLUCAP 172 (H) 02/02/2023   GLUCAP 171 (H) 02/01/2023   GLUCAP 171 (H) 02/01/2023     Exercise Target Goals: Exercise Program Goal: Individual exercise prescription set using results from initial 6 min walk test and THRR while considering  patient's activity barriers and safety.   Exercise Prescription Goal: Starting with aerobic activity 30 plus minutes a day, 3 days per week for initial exercise prescription. Provide home exercise prescription and guidelines that participant acknowledges understanding prior to discharge.  Activity Barriers & Risk Stratification:  Activity Barriers & Cardiac Risk Stratification - 04/18/23 1241       Activity Barriers & Cardiac Risk  Stratification   Activity Barriers Balance Concerns;History of Falls;Assistive Device;Deconditioning;Muscular Weakness;Shortness of Breath;Left Knee Replacement;Other (comment)    Comments ringing in her ears is disorienting    Cardiac Risk Stratification High             6 Minute Walk:  6 Minute Walk     Row Name 04/18/23 1403         6 Minute Walk   Phase Initial     Distance 630 feet     Walk Time 4.43 minutes     # of Rest Breaks 2  1:04, 30sec     MPH 1.61     METS 0.96     RPE 15     VO2 Peak 3.38     Symptoms Yes (comment)     Comments fatigue, knee pain     Resting HR 63 bpm     Resting BP 128/74     Resting Oxygen Saturation  96 %     Exercise Oxygen Saturation  during 6 min walk 92 %     Max Ex. HR 83 bpm     Max Ex. BP 138/84     2 Minute Post BP 122/74              Oxygen Initial Assessment:   Oxygen Re-Evaluation:   Oxygen Discharge (Final Oxygen Re-Evaluation):   Initial Exercise Prescription:  Initial Exercise Prescription - 04/18/23 1400       Date of Initial Exercise RX and Referring Provider   Date 04/18/23    Referring Provider Luane School MD      Oxygen   Maintain Oxygen Saturation 88% or higher      NuStep   Level 1    SPM 60    Minutes 30    METs 1.5      Prescription Details   Frequency (times per week) 2    Duration Progress to 30 minutes of continuous aerobic without signs/symptoms of physical distress      Intensity   THRR 40-80% of Max Heartrate 96-129    Ratings of Perceived Exertion 11-13    Perceived Dyspnea 0-4      Progression   Progression Continue to progress workloads to maintain intensity without signs/symptoms of physical distress.      Resistance Training   Training Prescription Yes    Weight 2lb    Reps 10-15             Perform Capillary Blood Glucose checks as needed.  Exercise Prescription Changes:   Exercise Prescription Changes     Row Name 04/18/23 1400  Response to Exercise   Blood Pressure (Admit) 128/74       Blood Pressure (Exercise) 138/84       Blood Pressure (Exit) 122/74       Heart Rate (Admit) 63 bpm       Heart Rate (Exercise) 83 bpm       Heart Rate (Exit) 63 bpm       Oxygen Saturation (Admit) 96 %       Oxygen Saturation (Exercise) 92 %       Rating of Perceived Exertion (Exercise) 15       Symptoms fatigue, chronic knee pain       Comments walk test results                Exercise Comments:   Exercise Goals and Review:   Exercise Goals     Row Name 04/18/23 1405             Exercise Goals   Increase Physical Activity Yes       Intervention Provide advice, education, support and counseling about physical activity/exercise needs.;Develop an individualized exercise prescription for aerobic and resistive training based on initial evaluation findings, risk stratification, comorbidities and participant's personal goals.       Expected Outcomes Short Term: Attend rehab on a regular basis to increase amount of physical activity.;Long Term: Add in home exercise to make exercise part of routine and to increase amount of physical activity.;Long Term: Exercising regularly at least 3-5 days a week.       Increase Strength and Stamina Yes       Intervention Provide advice, education, support and counseling about physical activity/exercise needs.;Develop an individualized exercise prescription for aerobic and resistive training based on initial evaluation findings, risk stratification, comorbidities and participant's personal goals.       Expected Outcomes Short Term: Increase workloads from initial exercise prescription for resistance, speed, and METs.;Short Term: Perform resistance training exercises routinely during rehab and add in resistance training at home;Long Term: Improve cardiorespiratory fitness, muscular endurance and strength as measured by increased METs and functional capacity ( )       Able to understand  and use rate of perceived exertion (RPE) scale Yes       Intervention Provide education and explanation on how to use RPE scale       Expected Outcomes Short Term: Able to use RPE daily in rehab to express subjective intensity level;Long Term:  Able to use RPE to guide intensity level when exercising independently       Able to understand and use Dyspnea scale Yes       Intervention Provide education and explanation on how to use Dyspnea scale       Expected Outcomes Short Term: Able to use Dyspnea scale daily in rehab to express subjective sense of shortness of breath during exertion;Long Term: Able to use Dyspnea scale to guide intensity level when exercising independently       Knowledge and understanding of Target Heart Rate Range (THRR) Yes       Intervention Provide education and explanation of THRR including how the numbers were predicted and where they are located for reference       Expected Outcomes Long Term: Able to use THRR to govern intensity when exercising independently;Short Term: Able to state/look up THRR;Short Term: Able to use daily as guideline for intensity in rehab       Able to check pulse independently Yes  Intervention Review the importance of being able to check your own pulse for safety during independent exercise;Provide education and demonstration on how to check pulse in carotid and radial arteries.       Expected Outcomes Short Term: Able to explain why pulse checking is important during independent exercise;Long Term: Able to check pulse independently and accurately       Understanding of Exercise Prescription Yes       Intervention Provide education, explanation, and written materials on patient's individual exercise prescription       Expected Outcomes Short Term: Able to explain program exercise prescription;Long Term: Able to explain home exercise prescription to exercise independently                Exercise Goals Re-Evaluation :    Discharge  Exercise Prescription (Final Exercise Prescription Changes):  Exercise Prescription Changes - 04/18/23 1400       Response to Exercise   Blood Pressure (Admit) 128/74    Blood Pressure (Exercise) 138/84    Blood Pressure (Exit) 122/74    Heart Rate (Admit) 63 bpm    Heart Rate (Exercise) 83 bpm    Heart Rate (Exit) 63 bpm    Oxygen Saturation (Admit) 96 %    Oxygen Saturation (Exercise) 92 %    Rating of Perceived Exertion (Exercise) 15    Symptoms fatigue, chronic knee pain    Comments walk test results             Nutrition:  Target Goals: Understanding of nutrition guidelines, daily intake of sodium 1500mg , cholesterol 200mg , calories 30% from fat and 7% or less from saturated fats, daily to have 5 or more servings of fruits and vegetables.  Biometrics:  Pre Biometrics - 04/18/23 1406       Pre Biometrics   Height 5\' 8"  (1.727 m)    Weight 104.3 kg    Waist Circumference 45 inches    Hip Circumference 48.5 inches    Waist to Hip Ratio 0.93 %    BMI (Calculated) 34.96    Grip Strength 17.8 kg    Single Leg Stand 2.4 seconds              Nutrition Therapy Plan and Nutrition Goals:  Nutrition Therapy & Goals - 04/18/23 1411       Intervention Plan   Intervention Prescribe, educate and counsel regarding individualized specific dietary modifications aiming towards targeted core components such as weight, hypertension, lipid management, diabetes, heart failure and other comorbidities.;Nutrition handout(s) given to patient.    Expected Outcomes Short Term Goal: Understand basic principles of dietary content, such as calories, fat, sodium, cholesterol and nutrients.;Long Term Goal: Adherence to prescribed nutrition plan.             Nutrition Assessments:  MEDIFICTS Score Key: >=70 Need to make dietary changes  40-70 Heart Healthy Diet <= 40 Therapeutic Level Cholesterol Diet  Flowsheet Row CARDIAC REHAB PHASE II ORIENTATION from 04/18/2023 in [facility] CARDIAC REHABILITATION  Picture Your Plate Total Score on Admission 56      Picture Your Plate Scores: <01 Unhealthy dietary pattern with much room for improvement. 41-50 Dietary pattern unlikely to meet recommendations for good health and room for improvement. 51-60 More healthful dietary pattern, with some room for improvement.  >60 Healthy dietary pattern, although there may be some specific behaviors that could be improved.    Nutrition Goals Re-Evaluation:   Nutrition Goals Discharge (Final Nutrition Goals Re-Evaluation):   Psychosocial: Target  Goals: Acknowledge presence or absence of significant depression and/or stress, maximize coping skills, provide positive support system. Participant is able to verbalize types and ability to use techniques and skills needed for reducing stress and depression.  Initial Review & Psychosocial Screening:  Initial Psych Review & Screening - 04/18/23 1244       Initial Review   Current issues with Current Stress Concerns    Source of Stress Concerns Chronic Illness;Unable to participate in former interests or hobbies;Unable to perform yard/household activities    Comments unable to do everything she wants, politics is scary, concerned for her future      Family Dynamics   Good Support System? Yes   husband, neighbors, family lives in [place]     Barriers   Psychosocial barriers to participate in program Psychosocial barriers identified (see note);The patient should benefit from training in stress management and relaxation.      Screening Interventions   Interventions Encouraged to exercise;Provide feedback about the scores to participant    Expected Outcomes Short Term goal: Utilizing psychosocial counselor, staff and physician to assist with identification of specific Stressors or current issues interfering with healing process. Setting desired goal for each stressor or current issue identified.;Long Term Goal: Stressors or current  issues are controlled or eliminated.;Short Term goal: Identification and review with participant of any Quality of Life or Depression concerns found by scoring the questionnaire.;Long Term goal: The participant improves quality of Life and PHQ9 Scores as seen by post scores and/or verbalization of changes             Quality of Life Scores:  Quality of Life - 04/18/23 1411       Quality of Life   Select Quality of Life      Quality of Life Scores   Health/Function Pre 22.1 %    Socioeconomic Pre 26.25 %    Psych/Spiritual Pre 25.5 %    Family Pre 23.11 %    GLOBAL Pre 23.91 %            Scores of 19 and below usually indicate a poorer quality of life in these areas.  A difference of  2-3 points is a clinically meaningful difference.  A difference of 2-3 points in the total score of the Quality of Life Index has been associated with significant improvement in overall quality of life, self-image, physical symptoms, and general health in studies assessing change in quality of life.  PHQ-9: Review Flowsheet       04/18/2023  Depression screen PHQ 2/9  Decreased Interest 0  Down, Depressed, Hopeless 0  PHQ - 2 Score 0  Altered sleeping 0  Tired, decreased energy 1  Change in appetite 0  Feeling bad or failure about yourself  0  Trouble concentrating 2  Moving slowly or fidgety/restless 0  Suicidal thoughts 0  PHQ-9 Score 3  Difficult doing work/chores Somewhat difficult   Interpretation of Total Score  Total Score Depression Severity:  1-4 = Minimal depression, 5-9 = Mild depression, 10-14 = Moderate depression, 15-19 = Moderately severe depression, 20-27 = Severe depression   Psychosocial Evaluation and Intervention:  Psychosocial Evaluation - 04/18/23 1406       Psychosocial Evaluation & Interventions   Interventions Encouraged to exercise with the program and follow exercise prescription;Stress management education    Comments Bissie is coming into cardiac rehab  after two valve replacements.  She has had a rough go since her surgery.  She also has gotten a  pacemaker and a couple of cardioversions.  She was in and out of afib after surgery but she is now in rhythm and feeling better.  She is eager to get going with rehab and wants to be able to go and do again.  She would like to move better and build up her confidence to get out and about.  Since surgery, she has let fear hold her back as she does not want to get herself in trouble.  She sleeps well, but has gotten into a bad habit of staying up late watching TV and then sleeping til 9am.  She does not preceive any barriers to attending rehab.  She enjoys going to the movies and getting out for a drive.    Expected Outcomes Short: Attend rehab to build stamina Long: Build confidence that she can do things.    Continue Psychosocial Services  Follow up required by staff             Psychosocial Re-Evaluation:   Psychosocial Discharge (Final Psychosocial Re-Evaluation):   Vocational Rehabilitation: Provide vocational rehab assistance to qualifying candidates.   Vocational Rehab Evaluation & Intervention:  Vocational Rehab - 04/18/23 1242       Initial Vocational Rehab Evaluation & Intervention   Assessment shows need for Vocational Rehabilitation No   retired            Education: Education Goals: Education classes will be provided on a weekly basis, covering required topics. Participant will state understanding/return demonstration of topics presented.  Learning Barriers/Preferences:  Learning Barriers/Preferences - 04/18/23 1239       Learning Barriers/Preferences   Learning Barriers Sight   glasses   Learning Preferences Skilled Demonstration;Written Material             Education Topics: Hypertension, Hypertension Reduction -Define heart disease and high blood pressure. Discus how high blood pressure affects the body and ways to reduce high blood pressure.   Exercise  and Your Heart -Discuss why it is important to exercise, the FITT principles of exercise, normal and abnormal responses to exercise, and how to exercise safely.   Angina -Discuss definition of angina, causes of angina, treatment of angina, and how to decrease risk of having angina.   Cardiac Medications -Review what the following cardiac medications are used for, how they affect the body, and side effects that may occur when taking the medications.  Medications include Aspirin, Beta blockers, calcium channel blockers, ACE Inhibitors, angiotensin receptor blockers, diuretics, digoxin, and antihyperlipidemics.   Congestive Heart Failure -Discuss the definition of CHF, how to live with CHF, the signs and symptoms of CHF, and how keep track of weight and sodium intake.   Heart Disease and Intimacy -Discus the effect sexual activity has on the heart, how changes occur during intimacy as we age, and safety during sexual activity.   Smoking Cessation / COPD -Discuss different methods to quit smoking, the health benefits of quitting smoking, and the definition of COPD.   Nutrition I: Fats -Discuss the types of cholesterol, what cholesterol does to the heart, and how cholesterol levels can be controlled.   Nutrition II: Labels -Discuss the different components of food labels and how to read food label   Heart Parts/Heart Disease and PAD -Discuss the anatomy of the heart, the pathway of blood circulation through the heart, and these are affected by heart disease.   Stress I: Signs and Symptoms -Discuss the causes of stress, how stress may lead to anxiety and depression, and ways  to limit stress.   Stress II: Relaxation -Discuss different types of relaxation techniques to limit stress.   Warning Signs of Stroke / TIA -Discuss definition of a stroke, what the signs and symptoms are of a stroke, and how to identify when someone is having stroke.   Knowledge Questionnaire Score:   Knowledge Questionnaire Score - 04/18/23 1412       Knowledge Questionnaire Score   Pre Score 23/26             Core Components/Risk Factors/Patient Goals at Admission:  Personal Goals and Risk Factors at Admission - 04/18/23 1412       Core Components/Risk Factors/Patient Goals on Admission    Weight Management Yes;Obesity;Weight Loss    Intervention Weight Management: Develop a combined nutrition and exercise program designed to reach desired caloric intake, while maintaining appropriate intake of nutrient and fiber, sodium and fats, and appropriate energy expenditure required for the weight goal.;Weight Management: Provide education and appropriate resources to help participant work on and attain dietary goals.;Weight Management/Obesity: Establish reasonable short term and long term weight goals.;Obesity: Provide education and appropriate resources to help participant work on and attain dietary goals.    Admit Weight 229 lb 14.4 oz (104.3 kg)    Goal Weight: Short Term 225 lb (102.1 kg)    Goal Weight: Long Term 220 lb (99.8 kg)    Expected Outcomes Short Term: Continue to assess and modify interventions until short term weight is achieved;Long Term: Adherence to nutrition and physical activity/exercise program aimed toward attainment of established weight goal;Weight Loss: Understanding of general recommendations for a balanced deficit meal plan, which promotes 1-2 lb weight loss per week and includes a negative energy balance of [phone] kcal/d;Understanding recommendations for meals to include 15-35% energy as protein, 25-35% energy from fat, 35-60% energy from carbohydrates, less than 200mg  of dietary cholesterol, 20-35 gm of total fiber daily;Understanding of distribution of calorie intake throughout the day with the consumption of 4-5 meals/snacks    Improve shortness of breath with ADL's Yes    Intervention Provide education, individualized exercise plan and daily activity  instruction to help decrease symptoms of SOB with activities of daily living.    Expected Outcomes Short Term: Improve cardiorespiratory fitness to achieve a reduction of symptoms when performing ADLs;Long Term: Be able to perform more ADLs without symptoms or delay the onset of symptoms    Diabetes Yes    Intervention Provide education about signs/symptoms and action to take for hypo/hyperglycemia.;Provide education about proper nutrition, including hydration, and aerobic/resistive exercise prescription along with prescribed medications to achieve blood glucose in normal ranges: Fasting glucose 65-99 mg/dL    Expected Outcomes Short Term: Participant verbalizes understanding of the signs/symptoms and immediate care of hyper/hypoglycemia, proper foot care and importance of medication, aerobic/resistive exercise and nutrition plan for blood glucose control.;Long Term: Attainment of HbA1C < 7%.    Hypertension Yes    Intervention Provide education on lifestyle modifcations including regular physical activity/exercise, weight management, moderate sodium restriction and increased consumption of fresh fruit, vegetables, and low fat dairy, alcohol moderation, and smoking cessation.;Monitor prescription use compliance.    Expected Outcomes Short Term: Continued assessment and intervention until BP is < 140/35mm HG in hypertensive participants. < 130/43mm HG in hypertensive participants with diabetes, heart failure or chronic kidney disease.;Long Term: Maintenance of blood pressure at goal levels.             Core Components/Risk Factors/Patient Goals Review:    Core Components/Risk Factors/Patient Goals  at Discharge (Final Review):    ITP Comments:  ITP Comments     Row Name 04/18/23 1402           ITP Comments Patient attend orientation today.  Patient is attending Cardiac Rehabilitation Program.  Documentation for diagnosis [facility] in [facility]10[facility].  Reviewed medical chart, RPE/RPD, gym  safety, and program guidelines.  Patient was fitted to equipment they will be using during rehab.  Patient is scheduled to start exercise on Tuesday 04/26/23 at 1330.   Initial ITP created and sent for review and signature by Dr. Dina Rich, Medical Director for Cardiac Rehabilitation Program.                Comments: Initial ITP

## 2023-04-21 ENCOUNTER — Encounter (HOSPITAL_COMMUNITY): Payer: Self-pay | Admitting: Internal Medicine

## 2023-04-26 ENCOUNTER — Encounter (HOSPITAL_COMMUNITY): Payer: Self-pay | Admitting: *Deleted

## 2023-04-26 ENCOUNTER — Encounter (HOSPITAL_COMMUNITY)
Admission: RE | Admit: 2023-04-26 | Discharge: 2023-04-26 | Disposition: A | Discharge status: Home / Self Care | Payer: Medicare Other | Source: Ambulatory Visit | Attending: Internal Medicine | Admitting: Internal Medicine

## 2023-04-26 DIAGNOSIS — Z952 Presence of prosthetic heart valve: Secondary | ICD-10-CM

## 2023-04-26 DIAGNOSIS — Z950 Presence of cardiac pacemaker: Secondary | ICD-10-CM

## 2023-04-26 DIAGNOSIS — E1159 Type 2 diabetes mellitus with other circulatory complications: Secondary | ICD-10-CM | POA: Diagnosis not present

## 2023-04-26 DIAGNOSIS — E782 Mixed hyperlipidemia: Secondary | ICD-10-CM | POA: Diagnosis not present

## 2023-04-26 DIAGNOSIS — G720 Drug-induced myopathy: Secondary | ICD-10-CM | POA: Diagnosis not present

## 2023-04-26 LAB — GLUCOSE, CAPILLARY
Glucose-Capillary: 153 mg/dL — ABNORMAL HIGH (ref 70–99)
Glucose-Capillary: 117 mg/dL — ABNORMAL HIGH (ref 70–99)

## 2023-04-26 NOTE — Progress Notes (Signed)
 Daily Session Note  Patient Details  Name: [name] MRN: [id number] Date of Birth: 04/15/1951 Referring Provider:   Flowsheet Row CARDIAC REHAB PHASE II ORIENTATION from 04/18/2023 in [facility] CARDIAC [facility]ILITATION  Referring Provider Stacia Beauvais MD       Encounter Date: 04/26/2023  Check In:  Session Check In - 04/26/23 1330       Check-In   Supervising physician immediately available to respond to emergencies See telemetry face sheet for immediately available MD    Location [facility]    Staff Present Powell Benders, BS, Exercise Physiologist;Danny Siri, RN, BSN    Virtual Visit No    Medication changes reported     No    Fall or balance concerns reported    No    Tobacco Cessation No Change    Warm-up and Cool-down Performed on first and last piece of equipment    Resistance Training Performed Yes    VAD Patient? No    PAD/SET Patient? No      Pain Assessment   Currently in Pain? No/denies    Multiple Pain Sites No             Capillary Blood Glucose: Results for orders placed or performed during the hospital encounter of 04/26/23 (from the past 24 hours)  Glucose, capillary     Status: Abnormal   Collection Time: 04/26/23  1:25 PM  Result Value Ref Range   Glucose-Capillary 153 (H) 70 - 99 mg/dL      Social History   Tobacco Use  Smoking Status Former   Current packs/day: 0.00   Average packs/day: 0.1 packs/day for 7.0 years (0.7 ttl pk-yrs)   Types: Cigarettes   Start date: 09/16/1962   Quit date: 09/15/1969   Years since quitting: 53.6  Smokeless Tobacco Never  Tobacco Comments   as a teenager    Goals Met:  Independence with exercise equipment Exercise tolerated well No report of concerns or symptoms today Strength training completed today  Goals Unmet:  Not Applicable  Comments:First full day of exercise!  Patient was oriented to gym and equipment including functions, settings, policies, and procedures.   Patient's individual exercise prescription and treatment plan were reviewed.  All starting workloads were established based on the results of the 6 minute walk test done at initial orientation visit.  The plan for exercise progression was also introduced and progression will be customized based on patient's performance and goals.

## 2023-04-26 NOTE — Progress Notes (Signed)
 Cardiac Individual Treatment Plan  Patient Details  Name: [name] MRN: [id number] Date of Birth: May 17, 1950 Referring Provider:   Flowsheet Row CARDIAC REHAB PHASE II ORIENTATION from 04/18/2023 in [facility] CARDIAC REHABILITATION  Referring Provider Stacia Beauvais MD       Initial Encounter Date:  Flowsheet Row CARDIAC REHAB PHASE II ORIENTATION from 04/18/2023 in [facility] CARDIAC REHABILITATION  Date 04/18/23       Visit Diagnosis: S/P MVR (mitral valve replacement)  S/P AVR (aortic valve replacement)  S/P placement of cardiac pacemaker  Patient's Home Medications on Admission:  Current Outpatient Medications:    amiodarone  (PACERONE ) 200 MG tablet, Take 1 tablet (200 mg total) by mouth daily. Amiodarone  200 mg BID x 3 weeks followed by amiodarone  200 mg once daily., Disp: 90 tablet, Rfl: 3   ezetimibe  (ZETIA ) 10 MG tablet, Take 10 mg by mouth daily., Disp: , Rfl:    losartan  (COZAAR ) 25 MG tablet, Take 1 tablet by mouth daily., Disp: , Rfl:    magnesium  oxide (MAG-OX) 400 (240 Mg) MG tablet, Take 1 tablet by mouth daily., Disp: , Rfl:    melatonin 1 MG TABS tablet, Take 4 mg by mouth at bedtime as needed (sleep). gummies, Disp: , Rfl:    metFORMIN  (GLUCOPHAGE ) 1000 MG tablet, Take 1,000 mg by mouth 2 (two) times daily., Disp: , Rfl:    metoprolol  succinate (TOPROL -XL) 25 MG 24 hr tablet, Take 1 tablet (25 mg total) by mouth daily., Disp: 30 tablet, Rfl: 2   nitrofurantoin  (MACRODANTIN ) 50 MG capsule, Take 50 mg by mouth daily., Disp: , Rfl:    omeprazole (PRILOSEC) 20 MG capsule, Take 20 mg by mouth daily. , Disp: , Rfl: 3   Potassium Chloride  ER 20 MEQ TBCR, Take 1 tablet (20 mEq total) by mouth daily., Disp: 60 tablet, Rfl: 2   warfarin (COUMADIN ) 3 MG tablet, Take 1/2 a tablet to 1 tablet by mouth daily as directed by the coumadin  clinic., Disp: 30 tablet, Rfl: 0  Past Medical History: Past Medical History:  Diagnosis Date   Aortic stenosis    21 mm  Inspiris Edwards AVR June 2024 - [facility]   Arthritis    Cervical incompetence    GERD (gastroesophageal reflux disease)    Hay fever    Heart block    [employer] DCPPM on 10/26/2022 with Dr. Saturnino - [facility]   Hemochromatosis    HOCM (hypertrophic obstructive cardiomyopathy) (HCC)    Septal myomectomy June 2024 - [facility]   Hypertension    Migraines    Mitral stenosis    25 mm [employer] mechanical MVR June 2024 - [facility]   Paroxysmal atrial fibrillation (HCC)    Sciatica    Type 2 diabetes mellitus (HCC)     Tobacco Use: Social History   Tobacco Use  Smoking Status Former   Current packs/day: 0.00   Average packs/day: 0.1 packs/day for 7.0 years (0.7 ttl pk-yrs)   Types: Cigarettes   Start date: 09/16/1962   Quit date: 09/15/1969   Years since quitting: 53.6  Smokeless Tobacco Never  Tobacco Comments   as a teenager    Labs: Review Flowsheet       Latest Ref Rng & Units 07/28/2020 10/01/2021 01/31/2023  Labs for ITP Cardiac and Pulmonary Rehab  Hemoglobin A1c 4.8 - 5.6 % - 6.8  7.5   Bicarbonate 20.0 - 28.0 mmol/L 27.7  - -  TCO2 22 - 32 mmol/L 29  - -  O2 Saturation %  75.0  - -    Capillary Blood Glucose: Lab Results  Component Value Date   GLUCAP 117 (H) 04/26/2023   GLUCAP 153 (H) 04/26/2023   GLUCAP 147 (H) 02/02/2023   GLUCAP 147 (H) 02/02/2023   GLUCAP 172 (H) 02/02/2023     Exercise Target Goals: Exercise Program Goal: Individual exercise prescription set using results from initial 6 min walk test and THRR while considering  patient's activity barriers and safety.   Exercise Prescription Goal: Starting with aerobic activity 30 plus minutes a day, 3 days per week for initial exercise prescription. Provide home exercise prescription and guidelines that participant acknowledges understanding prior to discharge.  Activity Barriers & Risk Stratification:  Activity Barriers & Cardiac Risk Stratification - 04/18/23 1241       Activity Barriers & Cardiac Risk  Stratification   Activity Barriers Balance Concerns;History of Falls;Assistive Device;Deconditioning;Muscular Weakness;Shortness of Breath;Left Knee Replacement;Other (comment)    Comments ringing in her ears is disorienting    Cardiac Risk Stratification High             6 Minute Walk:  6 Minute Walk     Row Name 04/18/23 1403         6 Minute Walk   Phase Initial     Distance 630 feet     Walk Time 4.43 minutes     # of Rest Breaks 2  1:04, 30sec     MPH 1.61     METS 0.96     RPE 15     VO2 Peak 3.38     Symptoms Yes (comment)     Comments fatigue, knee pain     Resting HR 63 bpm     Resting BP 128/74     Resting Oxygen Saturation  96 %     Exercise Oxygen Saturation  during 6 min walk 92 %     Max Ex. HR 83 bpm     Max Ex. BP 138/84     2 Minute Post BP 122/74              Oxygen Initial Assessment:   Oxygen Re-Evaluation:   Oxygen Discharge (Final Oxygen Re-Evaluation):   Initial Exercise Prescription:  Initial Exercise Prescription - 04/18/23 1400       Date of Initial Exercise RX and Referring Provider   Date 04/18/23    Referring Provider Stacia Beauvais MD      Oxygen   Maintain Oxygen Saturation 88% or higher      NuStep   Level 1    SPM 60    Minutes 30    METs 1.5      Prescription Details   Frequency (times per week) 2    Duration Progress to 30 minutes of continuous aerobic without signs/symptoms of physical distress      Intensity   THRR 40-80% of Max Heartrate 96-129    Ratings of Perceived Exertion 11-13    Perceived Dyspnea 0-4      Progression   Progression Continue to progress workloads to maintain intensity without signs/symptoms of physical distress.      Resistance Training   Training Prescription Yes    Weight 2lb    Reps 10-15             Perform Capillary Blood Glucose checks as needed.  Exercise Prescription Changes:   Exercise Prescription Changes     Row Name 04/18/23 1400  Response to Exercise   Blood Pressure (Admit) 128/74       Blood Pressure (Exercise) 138/84       Blood Pressure (Exit) 122/74       Heart Rate (Admit) 63 bpm       Heart Rate (Exercise) 83 bpm       Heart Rate (Exit) 63 bpm       Oxygen Saturation (Admit) 96 %       Oxygen Saturation (Exercise) 92 %       Rating of Perceived Exertion (Exercise) 15       Symptoms fatigue, chronic knee pain       Comments walk test results                Exercise Comments:   Exercise Goals and Review:   Exercise Goals     Row Name 04/18/23 1405             Exercise Goals   Increase Physical Activity Yes       Intervention Provide advice, education, support and counseling about physical activity/exercise needs.;Develop an individualized exercise prescription for aerobic and resistive training based on initial evaluation findings, risk stratification, comorbidities and participant's personal goals.       Expected Outcomes Short Term: Attend rehab on a regular basis to increase amount of physical activity.;Long Term: Add in home exercise to make exercise part of routine and to increase amount of physical activity.;Long Term: Exercising regularly at least 3-5 days a week.       Increase Strength and Stamina Yes       Intervention Provide advice, education, support and counseling about physical activity/exercise needs.;Develop an individualized exercise prescription for aerobic and resistive training based on initial evaluation findings, risk stratification, comorbidities and participant's personal goals.       Expected Outcomes Short Term: Increase workloads from initial exercise prescription for resistance, speed, and METs.;Short Term: Perform resistance training exercises routinely during rehab and add in resistance training at home;Long Term: Improve cardiorespiratory fitness, muscular endurance and strength as measured by increased METs and functional capacity ( )       Able to understand  and use rate of perceived exertion (RPE) scale Yes       Intervention Provide education and explanation on how to use RPE scale       Expected Outcomes Short Term: Able to use RPE daily in rehab to express subjective intensity level;Long Term:  Able to use RPE to guide intensity level when exercising independently       Able to understand and use Dyspnea scale Yes       Intervention Provide education and explanation on how to use Dyspnea scale       Expected Outcomes Short Term: Able to use Dyspnea scale daily in rehab to express subjective sense of shortness of breath during exertion;Long Term: Able to use Dyspnea scale to guide intensity level when exercising independently       Knowledge and understanding of Target Heart Rate Range (THRR) Yes       Intervention Provide education and explanation of THRR including how the numbers were predicted and where they are located for reference       Expected Outcomes Long Term: Able to use THRR to govern intensity when exercising independently;Short Term: Able to state/look up THRR;Short Term: Able to use daily as guideline for intensity in rehab       Able to check pulse independently Yes  Intervention Review the importance of being able to check your own pulse for safety during independent exercise;Provide education and demonstration on how to check pulse in carotid and radial arteries.       Expected Outcomes Short Term: Able to explain why pulse checking is important during independent exercise;Long Term: Able to check pulse independently and accurately       Understanding of Exercise Prescription Yes       Intervention Provide education, explanation, and written materials on patient's individual exercise prescription       Expected Outcomes Short Term: Able to explain program exercise prescription;Long Term: Able to explain home exercise prescription to exercise independently                Exercise Goals Re-Evaluation :    Discharge  Exercise Prescription (Final Exercise Prescription Changes):  Exercise Prescription Changes - 04/18/23 1400       Response to Exercise   Blood Pressure (Admit) 128/74    Blood Pressure (Exercise) 138/84    Blood Pressure (Exit) 122/74    Heart Rate (Admit) 63 bpm    Heart Rate (Exercise) 83 bpm    Heart Rate (Exit) 63 bpm    Oxygen Saturation (Admit) 96 %    Oxygen Saturation (Exercise) 92 %    Rating of Perceived Exertion (Exercise) 15    Symptoms fatigue, chronic knee pain    Comments walk test results             Nutrition:  Target Goals: Understanding of nutrition guidelines, daily intake of sodium 1500mg , cholesterol 200mg , calories 30% from fat and 7% or less from saturated fats, daily to have 5 or more servings of fruits and vegetables.  Biometrics:  Pre Biometrics - 04/18/23 1406       Pre Biometrics   Height 5' 8 (1.727 m)    Weight 229 lb 14.4 oz (104.3 kg)    Waist Circumference 45 inches    Hip Circumference 48.5 inches    Waist to Hip Ratio 0.93 %    BMI (Calculated) 34.96    Grip Strength 17.8 kg    Single Leg Stand 2.4 seconds              Nutrition Therapy Plan and Nutrition Goals:  Nutrition Therapy & Goals - 04/18/23 1411       Intervention Plan   Intervention Prescribe, educate and counsel regarding individualized specific dietary modifications aiming towards targeted core components such as weight, hypertension, lipid management, diabetes, heart failure and other comorbidities.;Nutrition handout(s) given to patient.    Expected Outcomes Short Term Goal: Understand basic principles of dietary content, such as calories, fat, sodium, cholesterol and nutrients.;Long Term Goal: Adherence to prescribed nutrition plan.             Nutrition Assessments:  MEDIFICTS Score Key: >=70 Need to make dietary changes  40-70 Heart Healthy Diet <= 40 Therapeutic Level Cholesterol Diet  Flowsheet Row CARDIAC REHAB PHASE II ORIENTATION from  04/18/2023 in [facility] CARDIAC REHABILITATION  Picture Your Plate Total Score on Admission 56      Picture Your Plate Scores: <59 Unhealthy dietary pattern with much room for improvement. 41-50 Dietary pattern unlikely to meet recommendations for good health and room for improvement. 51-60 More healthful dietary pattern, with some room for improvement.  >60 Healthy dietary pattern, although there may be some specific behaviors that could be improved.    Nutrition Goals Re-Evaluation:   Nutrition Goals Discharge (Final Nutrition Goals Re-Evaluation):  Psychosocial: Target Goals: Acknowledge presence or absence of significant depression and/or stress, maximize coping skills, provide positive support system. Participant is able to verbalize types and ability to use techniques and skills needed for reducing stress and depression.  Initial Review & Psychosocial Screening:  Initial Psych Review & Screening - 04/18/23 1244       Initial Review   Current issues with Current Stress Concerns    Source of Stress Concerns Chronic Illness;Unable to participate in former interests or hobbies;Unable to perform yard/household activities    Comments unable to do everything she wants, politics is scary, concerned for her future      Family Dynamics   Good Support System? Yes   husband, neighbors, family lives in [place]      Barriers   Psychosocial barriers to participate in program Psychosocial barriers identified (see note);The patient should benefit from training in stress management and relaxation.      Screening Interventions   Interventions Encouraged to exercise;Provide feedback about the scores to participant    Expected Outcomes Short Term goal: Utilizing psychosocial counselor, staff and physician to assist with identification of specific Stressors or current issues interfering with healing process. Setting desired goal for each stressor or current issue identified.;Long Term Goal:  Stressors or current issues are controlled or eliminated.;Short Term goal: Identification and review with participant of any Quality of Life or Depression concerns found by scoring the questionnaire.;Long Term goal: The participant improves quality of Life and PHQ9 Scores as seen by post scores and/or verbalization of changes             Quality of Life Scores:  Quality of Life - 04/18/23 1411       Quality of Life   Select Quality of Life      Quality of Life Scores   Health/Function Pre 22.1 %    Socioeconomic Pre 26.25 %    Psych/Spiritual Pre 25.5 %    Family Pre 23.11 %    GLOBAL Pre 23.91 %            Scores of 19 and below usually indicate a poorer quality of life in these areas.  A difference of  2-3 points is a clinically meaningful difference.  A difference of 2-3 points in the total score of the Quality of Life Index has been associated with significant improvement in overall quality of life, self-image, physical symptoms, and general health in studies assessing change in quality of life.  PHQ-9: Review Flowsheet       04/18/2023  Depression screen PHQ 2/9  Decreased Interest 0  Down, Depressed, Hopeless 0  PHQ - 2 Score 0  Altered sleeping 0  Tired, decreased energy 1  Change in appetite 0  Feeling bad or failure about yourself  0  Trouble concentrating 2  Moving slowly or fidgety/restless 0  Suicidal thoughts 0  PHQ-9 Score 3  Difficult doing work/chores Somewhat difficult   Interpretation of Total Score  Total Score Depression Severity:  1-4 = Minimal depression, 5-9 = Mild depression, 10-14 = Moderate depression, 15-19 = Moderately severe depression, 20-27 = Severe depression   Psychosocial Evaluation and Intervention:  Psychosocial Evaluation - 04/18/23 1406       Psychosocial Evaluation & Interventions   Interventions Encouraged to exercise with the program and follow exercise prescription;Stress management education    Comments Randal is  coming into cardiac rehab after two valve replacements.  She has had a rough go since her surgery.  She also has  gotten a pacemaker and a couple of cardioversions.  She was in and out of afib after surgery but she is now in rhythm and feeling better.  She is eager to get going with rehab and wants to be able to go and do again.  She would like to move better and build up her confidence to get out and about.  Since surgery, she has let fear hold her back as she does not want to get herself in trouble.  She sleeps well, but has gotten into a bad habit of staying up late watching TV and then sleeping til 9am.  She does not preceive any barriers to attending rehab.  She enjoys going to the movies and getting out for a drive.    Expected Outcomes Short: Attend rehab to build stamina Long: Build confidence that she can do things.    Continue Psychosocial Services  Follow up required by staff             Psychosocial Re-Evaluation:   Psychosocial Discharge (Final Psychosocial Re-Evaluation):   Vocational Rehabilitation: Provide vocational rehab assistance to qualifying candidates.   Vocational Rehab Evaluation & Intervention:  Vocational Rehab - 04/18/23 1242       Initial Vocational Rehab Evaluation & Intervention   Assessment shows need for Vocational Rehabilitation No   retired            Education: Education Goals: Education classes will be provided on a weekly basis, covering required topics. Participant will state understanding/return demonstration of topics presented.  Learning Barriers/Preferences:  Learning Barriers/Preferences - 04/18/23 1239       Learning Barriers/Preferences   Learning Barriers Sight   glasses   Learning Preferences Skilled Demonstration;Written Material             Education Topics: Hypertension, Hypertension Reduction -Define heart disease and high blood pressure. Discus how high blood pressure affects the body and ways to reduce high blood  pressure.   Exercise and Your Heart -Discuss why it is important to exercise, the FITT principles of exercise, normal and abnormal responses to exercise, and how to exercise safely.   Angina -Discuss definition of angina, causes of angina, treatment of angina, and how to decrease risk of having angina.   Cardiac Medications -Review what the following cardiac medications are used for, how they affect the body, and side effects that may occur when taking the medications.  Medications include Aspirin , Beta blockers, calcium channel blockers, ACE Inhibitors, angiotensin receptor blockers, diuretics, digoxin, and antihyperlipidemics.   Congestive Heart Failure -Discuss the definition of CHF, how to live with CHF, the signs and symptoms of CHF, and how keep track of weight and sodium intake.   Heart Disease and Intimacy -Discus the effect sexual activity has on the heart, how changes occur during intimacy as we age, and safety during sexual activity.   Smoking Cessation / COPD -Discuss different methods to quit smoking, the health benefits of quitting smoking, and the definition of COPD.   Nutrition I: Fats -Discuss the types of cholesterol, what cholesterol does to the heart, and how cholesterol levels can be controlled.   Nutrition II: Labels -Discuss the different components of food labels and how to read food label   Heart Parts/Heart Disease and PAD -Discuss the anatomy of the heart, the pathway of blood circulation through the heart, and these are affected by heart disease.   Stress I: Signs and Symptoms -Discuss the causes of stress, how stress may lead to anxiety and depression,  and ways to limit stress.   Stress II: Relaxation -Discuss different types of relaxation techniques to limit stress.   Warning Signs of Stroke / TIA -Discuss definition of a stroke, what the signs and symptoms are of a stroke, and how to identify when someone is having stroke.   Knowledge  Questionnaire Score:  Knowledge Questionnaire Score - 04/18/23 1412       Knowledge Questionnaire Score   Pre Score 23/26             Core Components/Risk Factors/Patient Goals at Admission:  Personal Goals and Risk Factors at Admission - 04/18/23 1412       Core Components/Risk Factors/Patient Goals on Admission    Weight Management Yes;Obesity;Weight Loss    Intervention Weight Management: Develop a combined nutrition and exercise program designed to reach desired caloric intake, while maintaining appropriate intake of nutrient and fiber, sodium and fats, and appropriate energy expenditure required for the weight goal.;Weight Management: Provide education and appropriate resources to help participant work on and attain dietary goals.;Weight Management/Obesity: Establish reasonable short term and long term weight goals.;Obesity: Provide education and appropriate resources to help participant work on and attain dietary goals.    Admit Weight 229 lb 14.4 oz (104.3 kg)    Goal Weight: Short Term 225 lb (102.1 kg)    Goal Weight: Long Term 220 lb (99.8 kg)    Expected Outcomes Short Term: Continue to assess and modify interventions until short term weight is achieved;Long Term: Adherence to nutrition and physical activity/exercise program aimed toward attainment of established weight goal;Weight Loss: Understanding of general recommendations for a balanced deficit meal plan, which promotes 1-2 lb weight loss per week and includes a negative energy balance of [phone] kcal/d;Understanding recommendations for meals to include 15-35% energy as protein, 25-35% energy from fat, 35-60% energy from carbohydrates, less than 200mg  of dietary cholesterol, 20-35 gm of total fiber daily;Understanding of distribution of calorie intake throughout the day with the consumption of 4-5 meals/snacks    Improve shortness of breath with ADL's Yes    Intervention Provide education, individualized exercise plan and  daily activity instruction to help decrease symptoms of SOB with activities of daily living.    Expected Outcomes Short Term: Improve cardiorespiratory fitness to achieve a reduction of symptoms when performing ADLs;Long Term: Be able to perform more ADLs without symptoms or delay the onset of symptoms    Diabetes Yes    Intervention Provide education about signs/symptoms and action to take for hypo/hyperglycemia.;Provide education about proper nutrition, including hydration, and aerobic/resistive exercise prescription along with prescribed medications to achieve blood glucose in normal ranges: Fasting glucose 65-99 mg/dL    Expected Outcomes Short Term: Participant verbalizes understanding of the signs/symptoms and immediate care of hyper/hypoglycemia, proper foot care and importance of medication, aerobic/resistive exercise and nutrition plan for blood glucose control.;Long Term: Attainment of HbA1C < 7%.    Hypertension Yes    Intervention Provide education on lifestyle modifcations including regular physical activity/exercise, weight management, moderate sodium restriction and increased consumption of fresh fruit, vegetables, and low fat dairy, alcohol moderation, and smoking cessation.;Monitor prescription use compliance.    Expected Outcomes Short Term: Continued assessment and intervention until BP is < 140/31mm HG in hypertensive participants. < 130/2mm HG in hypertensive participants with diabetes, heart failure or chronic kidney disease.;Long Term: Maintenance of blood pressure at goal levels.             Core Components/Risk Factors/Patient Goals Review:    Core Components/Risk  Factors/Patient Goals at Discharge (Final Review):    ITP Comments:  ITP Comments     Row Name 04/18/23 1402 04/26/23 1337 04/26/23 1434       ITP Comments Patient attend orientation today.  Patient is attending Cardiac Rehabilitation Program.  Documentation for diagnosis can be found in [facility] 11/10/22.   Reviewed medical chart, RPE/RPD, gym safety, and program guidelines.  Patient was fitted to equipment they will be using during rehab.  Patient is scheduled to start exercise on Tuesday 04/26/23 at 1330.   Initial ITP created and sent for review and signature by Dr. Dorn Ross, Medical Director for Cardiac Rehabilitation Program. First full day of exercise!  Patient was oriented to gym and equipment including functions, settings, policies, and procedures.  Patient's individual exercise prescription and treatment plan were reviewed.  All starting workloads were established based on the results of the 6 minute walk test done at initial orientation visit.  The plan for exercise progression was also introduced and progression will be customized based on patient's performance and goals. 30 day review completed. ITP sent to Dr. Dorn Ross, Medical Director of Cardiac Rehab. Continue with ITP unless changes are made by physician.  New to program, just finished her first exercise session today.              Comments: 30 day review

## 2023-04-28 ENCOUNTER — Encounter (HOSPITAL_COMMUNITY)
Admission: RE | Admit: 2023-04-28 | Discharge: 2023-04-28 | Disposition: A | Discharge status: Home / Self Care | Payer: Medicare Other | Source: Ambulatory Visit | Attending: Internal Medicine | Admitting: Internal Medicine

## 2023-04-28 DIAGNOSIS — Z952 Presence of prosthetic heart valve: Secondary | ICD-10-CM | POA: Insufficient documentation

## 2023-04-28 DIAGNOSIS — Z950 Presence of cardiac pacemaker: Secondary | ICD-10-CM | POA: Diagnosis not present

## 2023-04-28 LAB — GLUCOSE, CAPILLARY
Glucose-Capillary: 162 mg/dL — ABNORMAL HIGH (ref 70–99)
Glucose-Capillary: 114 mg/dL — ABNORMAL HIGH (ref 70–99)

## 2023-04-28 NOTE — Progress Notes (Signed)
 Daily Session Note  Patient Details  Name: [name] MRN: [id number] Date of Birth: 09-Oct-1950 Referring Provider:   Flowsheet Row CARDIAC REHAB PHASE II ORIENTATION from 04/18/2023 in [facility] CARDIAC REHABILITATION  Referring Provider Stacia Beauvais MD       Encounter Date: 04/28/2023  Check In:  Session Check In - 04/28/23 1330       Check-In   Supervising physician immediately available to respond to emergencies See telemetry face sheet for immediately available MD    Location [facility]    Staff Present Powell Benders, BS, Exercise Physiologist;Other   Vernell RN   Virtual Visit No    Medication changes reported     No    Fall or balance concerns reported    No    Tobacco Cessation No Change    Warm-up and Cool-down Performed on first and last piece of equipment    Resistance Training Performed Yes    VAD Patient? No    PAD/SET Patient? No      Pain Assessment   Currently in Pain? No/denies    Multiple Pain Sites No             Capillary Blood Glucose: Results for orders placed or performed during the hospital encounter of 04/28/23 (from the past 24 hours)  Glucose, capillary     Status: Abnormal   Collection Time: 04/28/23  1:35 PM  Result Value Ref Range   Glucose-Capillary 162 (H) 70 - 99 mg/dL      Social History   Tobacco Use  Smoking Status Former   Current packs/day: 0.00   Average packs/day: 0.1 packs/day for 7.0 years (0.7 ttl pk-yrs)   Types: Cigarettes   Start date: 09/16/1962   Quit date: 09/15/1969   Years since quitting: 53.6  Smokeless Tobacco Never  Tobacco Comments   as a teenager    Goals Met:  Independence with exercise equipment Exercise tolerated well No report of concerns or symptoms today Strength training completed today  Goals Unmet:  Not Applicable  Comments: Pt able to follow exercise prescription today without complaint.  Will continue to monitor for progression.

## 2023-05-03 ENCOUNTER — Encounter (HOSPITAL_COMMUNITY): Payer: Medicare Other

## 2023-05-04 ENCOUNTER — Ambulatory Visit: Payer: Medicare Other | Attending: Cardiovascular Disease | Admitting: *Deleted

## 2023-05-04 DIAGNOSIS — Z5181 Encounter for therapeutic drug level monitoring: Secondary | ICD-10-CM | POA: Diagnosis not present

## 2023-05-04 DIAGNOSIS — Z952 Presence of prosthetic heart valve: Secondary | ICD-10-CM

## 2023-05-04 LAB — POCT INR: INR: 1.9 — AB (ref 2.0–3.0)

## 2023-05-04 NOTE — Patient Instructions (Signed)
 S/P TEE/DCCV on 02/02/23 Take warfarin 1 1/2 tablets tonight then increase dose to 1 tablet daily except 1/2 tablet on Tuesdays and Saturdays On amiodarone 200mg  daily Recheck in 2 wks

## 2023-05-05 ENCOUNTER — Encounter (HOSPITAL_COMMUNITY)
Admission: RE | Admit: 2023-05-05 | Discharge: 2023-05-05 | Disposition: A | Discharge status: Home / Self Care | Payer: Medicare Other | Source: Ambulatory Visit | Attending: Internal Medicine | Admitting: Internal Medicine

## 2023-05-05 DIAGNOSIS — Z952 Presence of prosthetic heart valve: Secondary | ICD-10-CM | POA: Diagnosis not present

## 2023-05-05 DIAGNOSIS — Z950 Presence of cardiac pacemaker: Secondary | ICD-10-CM

## 2023-05-05 NOTE — Progress Notes (Signed)
 Daily Session Note  Patient Details  Name: MURRAY GUZZETTA MRN: [id number] Date of Birth: [birth year] Referring Provider:   Flowsheet Row CARDIAC REHAB PHASE II ORIENTATION from 04/18/2023 in [facility] CARDIAC REHABILIT[facility]  Referring Provider Stacia Beauvais MD       Encounter Date: 05/05/2023  Check In:  Session Check In - 05/05/23 1341       Check-In   Supervising physician immediately available to respond to emergencies See telemetry face sheet for immediately available MD    Location [facility]    Staff Present Powell Benders, BS, Exercise Physiologist;Gage Weant Vonzell, MA, RCEP, CCRP, Sueellen Louder, RN, BSN    Virtual Visit No    Medication changes reported     No    Fall or balance concerns reported    No    Warm-up and Cool-down Performed on first and last piece of equipment    Resistance Training Performed Yes    VAD Patient? No    PAD/SET Patient? No      Pain Assessment   Currently in Pain? No/denies             Capillary Blood Glucose: No results found for this or any previous visit (from the past 24 hours).    Social History   Tobacco Use  Smoking Status Former   Current packs/day: 0.00   Average packs/day: 0.1 packs/day for 7.0 years (0.7 ttl pk-yrs)   Types: Cigarettes   Start date: 09/16/1962   Quit date: 09/15/1969   Years since quitting: 53.6  Smokeless Tobacco Never  Tobacco Comments   as a teenager    Goals Met:  Exercise tolerated well Personal goals reviewed No report of concerns or symptoms today Strength training completed today  Goals Unmet:  Not Applicable  Comments: Pt able to follow exercise prescription today without complaint.  Will continue to monitor for progression.

## 2023-05-10 ENCOUNTER — Encounter (HOSPITAL_COMMUNITY)
Admission: RE | Admit: 2023-05-10 | Discharge: 2023-05-10 | Disposition: A | Discharge status: Home / Self Care | Payer: Medicare Other | Source: Ambulatory Visit | Attending: Internal Medicine

## 2023-05-10 DIAGNOSIS — Z952 Presence of prosthetic heart valve: Secondary | ICD-10-CM | POA: Diagnosis not present

## 2023-05-10 DIAGNOSIS — Z950 Presence of cardiac pacemaker: Secondary | ICD-10-CM

## 2023-05-10 NOTE — Progress Notes (Signed)
 Daily Session Note  Patient Details  Name: [name] MRN: [id number] Date of Birth: [birth year] Referring Provider:   Flowsheet Row CARDIAC REHAB PHASE II ORIENTATION from 04/18/2023 in [facility] CARDIAC REHABILITATION  Referring Provider Stacia Beauvais MD       Encounter Date: 05/10/2023  Check In:  Session Check In - 05/10/23 1327       Check-In   Supervising physician immediately available to respond to emergencies See telemetry face sheet for immediately available MD    Location [facility]    Staff Present Powell Benders, BS, Exercise Physiologist;Oronde Hallenbeck Vonzell, MA, RCEP, CCRP, CCET;Phyllis Billingsley, RN    Virtual Visit No    Medication changes reported     No    Fall or balance concerns reported    No    Warm-up and Cool-down Performed on first and last piece of equipment    Resistance Training Performed Yes    VAD Patient? No    PAD/SET Patient? No      Pain Assessment   Currently in Pain? No/denies             Capillary Blood Glucose: No results found for this or any previous visit (from the past 24 hours).    Social History   Tobacco Use  Smoking Status Former   Current packs/day: 0.00   Average packs/day: 0.1 packs/day for 7.0 years (0.7 ttl pk-yrs)   Types: Cigarettes   Start date: 09/16/1962   Quit date: 09/15/1969   Years since quitting: 53.6  Smokeless Tobacco Never  Tobacco Comments   as a teenager    Goals Met:  Independence with exercise equipment Exercise tolerated well No report of concerns or symptoms today Strength training completed today  Goals Unmet:  Not Applicable  Comments: Pt able to follow exercise prescription today without complaint.  Will continue to monitor for progression.

## 2023-05-12 ENCOUNTER — Encounter (HOSPITAL_COMMUNITY): Payer: Medicare Other

## 2023-05-13 ENCOUNTER — Ambulatory Visit: Payer: Medicare Other

## 2023-05-13 DIAGNOSIS — I442 Atrioventricular block, complete: Secondary | ICD-10-CM | POA: Diagnosis not present

## 2023-05-13 LAB — CUP PACEART REMOTE DEVICE CHECK
Battery Remaining Longevity: 156 mo
Battery Remaining Percentage: 100 %
Brady Statistic RA Percent Paced: 78 %
Brady Statistic RV Percent Paced: 100 %
Date Time Interrogation Session: 20250117044000
Implantable Lead Connection Status: 753985
Implantable Lead Connection Status: 753985
Implantable Lead Implant Date: 20240702
Implantable Lead Implant Date: 20240702
Implantable Lead Location: 753859
Implantable Lead Location: 753860
Implantable Lead Model: 7841
Implantable Lead Model: 7842
Implantable Lead Serial Number: 1337013
Implantable Lead Serial Number: 1458949
Implantable Pulse Generator Implant Date: 20240702
Lead Channel Impedance Value: 569 Ohm
Lead Channel Impedance Value: 600 Ohm
Lead Channel Setting Pacing Amplitude: 2 V
Lead Channel Setting Pacing Amplitude: 2.5 V
Lead Channel Setting Pacing Pulse Width: 0.4 ms
Lead Channel Setting Sensing Sensitivity: 2.5 mV
Pulse Gen Serial Number: 680078
Zone Setting Status: 755011

## 2023-05-16 ENCOUNTER — Ambulatory Visit: Payer: Medicare Other | Attending: Cardiovascular Disease | Admitting: *Deleted

## 2023-05-16 ENCOUNTER — Encounter: Payer: Self-pay | Admitting: Internal Medicine

## 2023-05-16 DIAGNOSIS — Z952 Presence of prosthetic heart valve: Secondary | ICD-10-CM

## 2023-05-16 DIAGNOSIS — Z5181 Encounter for therapeutic drug level monitoring: Secondary | ICD-10-CM | POA: Diagnosis not present

## 2023-05-16 LAB — POCT INR: INR: 2.5 (ref 2.0–3.0)

## 2023-05-16 NOTE — Patient Instructions (Signed)
S/P TEE/DCCV on 02/02/23 Continue warfarin 1 tablet daily except 1/2 tablet on Tuesdays and Saturdays On amiodarone 200mg  daily Recheck in 3 wks

## 2023-05-17 ENCOUNTER — Encounter (HOSPITAL_COMMUNITY): Payer: Medicare Other

## 2023-05-19 ENCOUNTER — Encounter (HOSPITAL_COMMUNITY)
Admission: RE | Admit: 2023-05-19 | Discharge: 2023-05-19 | Disposition: A | Discharge status: Home / Self Care | Payer: Medicare Other | Source: Ambulatory Visit | Attending: Internal Medicine | Admitting: Internal Medicine

## 2023-05-19 DIAGNOSIS — Z950 Presence of cardiac pacemaker: Secondary | ICD-10-CM | POA: Diagnosis not present

## 2023-05-19 DIAGNOSIS — Z952 Presence of prosthetic heart valve: Secondary | ICD-10-CM | POA: Diagnosis not present

## 2023-05-19 NOTE — Progress Notes (Signed)
Daily Session Note  Patient Details  Name: [name] MRN: [id number] Date of Birth: [birth year] Referring Provider:   Flowsheet Row CARDIAC REHAB PHASE II ORIENTATION from 04/18/2023 in [facility] CARDIAC REHABILITATION  Referring Provider Luane School MD       Encounter Date: 05/19/2023  Check In:  Session Check In - 05/19/23 1315       Check-In   Supervising physician immediately available to respond to emergencies See telemetry face sheet for immediately available ER MD    Location [facility]    Staff Present Ross Ludwig, BS, Exercise Physiologist;Brooke Elwyn Reach, Margarite Gouge, RN, BSN    Virtual Visit No    Medication changes reported     No    Fall or balance concerns reported    No    Warm-up and Cool-down Performed on first and last piece of equipment    Resistance Training Performed Yes    VAD Patient? No    PAD/SET Patient? No      Pain Assessment   Currently in Pain? No/denies    Multiple Pain Sites No             Capillary Blood Glucose: No results found for this or any previous visit (from the past 24 hours).    Social History   Tobacco Use  Smoking Status Former   Current packs/day: 0.00   Average packs/day: 0.1 packs/day for 7.0 years (0.7 ttl pk-yrs)   Types: Cigarettes   Start date: 09/16/1962   Quit date: 09/15/1969   Years since quitting: 53.7  Smokeless Tobacco Never  Tobacco Comments   as a teenager    Goals Met:  Independence with exercise equipment Exercise tolerated well No report of concerns or symptoms today Strength training completed today  Goals Unmet:  Not Applicable  Comments: Pt able to follow exercise prescription today without complaint.  Will continue to monitor for progression.Marland Kitchen

## 2023-05-24 ENCOUNTER — Encounter (HOSPITAL_COMMUNITY)
Admission: RE | Admit: 2023-05-24 | Discharge: 2023-05-24 | Disposition: A | Discharge status: Home / Self Care | Payer: Medicare Other | Source: Ambulatory Visit | Attending: Internal Medicine | Admitting: Internal Medicine

## 2023-05-24 DIAGNOSIS — Z950 Presence of cardiac pacemaker: Secondary | ICD-10-CM | POA: Diagnosis not present

## 2023-05-24 DIAGNOSIS — Z952 Presence of prosthetic heart valve: Secondary | ICD-10-CM | POA: Diagnosis not present

## 2023-05-24 NOTE — Progress Notes (Signed)
Daily Session Note  Patient Details  Name: [name] MRN: [id number] Date of Birth: 1950-09-13 Referring Provider:   Flowsheet Row CARDIAC REHAB PHASE II ORIENTATION from 04/18/2023 in [facility] CARDIAC REHABILITATION  Referring Provider Luane School MD       Encounter Date: 05/24/2023  Check In:  Session Check In - 05/24/23 1330       Check-In   Supervising physician immediately available to respond to emergencies See telemetry face sheet for immediately available MD    Location [facility]    Staff Present Terrance Mass, RN;Jessica Juanetta Gosling, MA, RCEP, CCRP, CCET;Heather Fredric Mare, [place], Exercise Physiologist;Elleah Hemsley, RN    Virtual Visit No    Medication changes reported     No    Fall or balance concerns reported    No    Warm-up and Cool-down Performed on first and last piece of equipment    Resistance Training Performed Yes    VAD Patient? No    PAD/SET Patient? No      Pain Assessment   Currently in Pain? No/denies    Multiple Pain Sites No             Capillary Blood Glucose: No results found for this or any previous visit (from the past 24 hours).    Social History   Tobacco Use  Smoking Status Former   Current packs/day: 0.00   Average packs/day: 0.1 packs/day for 7.0 years (0.7 ttl pk-yrs)   Types: Cigarettes   Start date: 09/16/1962   Quit date: 09/15/1969   Years since quitting: 53.7  Smokeless Tobacco Never  Tobacco Comments   as a teenager    Goals Met:  Independence with exercise equipment Exercise tolerated well No report of concerns or symptoms today Strength training completed today  Goals Unmet:  Not Applicable  Comments: Pt able to follow exercise prescription today without complaint.  Will continue to monitor for progression.

## 2023-05-25 ENCOUNTER — Encounter (HOSPITAL_COMMUNITY): Payer: Self-pay | Admitting: *Deleted

## 2023-05-25 DIAGNOSIS — Z952 Presence of prosthetic heart valve: Secondary | ICD-10-CM

## 2023-05-25 DIAGNOSIS — Z950 Presence of cardiac pacemaker: Secondary | ICD-10-CM

## 2023-05-25 NOTE — Progress Notes (Signed)
Cardiac Individual Treatment Plan  Patient Details  Name: [name] MRN: [id number] Date of Birth: Aug 04, 1950 Referring Provider:   Flowsheet Row CARDIAC REHAB PHASE II ORIENTATION from 04/18/2023 in [facility] CARDIAC REHABILITATION  Referring Provider Luane School MD       Initial Encounter Date:  Flowsheet Row CARDIAC REHAB PHASE II ORIENTATION from 04/18/2023 in [place] CARDIAC REHABILITATION  Date 04/18/23       Visit Diagnosis: S/P AVR (aortic valve replacement)  S/P MVR (mitral valve replacement)  S/P placement of cardiac pacemaker  Patient's Home Medications on Admission:  Current Outpatient Medications:    amiodarone (PACERONE) 200 MG tablet, Take 1 tablet (200 mg total) by mouth daily. Amiodarone 200 mg BID x 3 weeks followed by amiodarone 200 mg once daily., Disp: 90 tablet, Rfl: 3   ezetimibe (ZETIA) 10 MG tablet, Take 10 mg by mouth daily., Disp: , Rfl:    losartan (COZAAR) 25 MG tablet, Take 1 tablet by mouth daily., Disp: , Rfl:    magnesium oxide (MAG-OX) 400 (240 Mg) MG tablet, Take 1 tablet by mouth daily., Disp: , Rfl:    melatonin 1 MG TABS tablet, Take 4 mg by mouth at bedtime as needed (sleep). gummies, Disp: , Rfl:    metFORMIN (GLUCOPHAGE) 1000 MG tablet, Take 1,000 mg by mouth 2 (two) times daily., Disp: , Rfl:    metoprolol succinate (TOPROL-XL) 25 MG 24 hr tablet, Take 1 tablet (25 mg total) by mouth daily., Disp: 30 tablet, Rfl: 2   nitrofurantoin (MACRODANTIN) 50 MG capsule, Take 50 mg by mouth daily., Disp: , Rfl:    omeprazole (PRILOSEC) 20 MG capsule, Take 20 mg by mouth daily. , Disp: , Rfl: 3   Potassium Chloride ER 20 MEQ TBCR, Take 1 tablet (20 mEq total) by mouth daily., Disp: 60 tablet, Rfl: 2   warfarin (COUMADIN) 3 MG tablet, Take 1/2 a tablet to 1 tablet by mouth daily as directed by the coumadin clinic., Disp: 30 tablet, Rfl: 0  Past Medical History: Past Medical History:  Diagnosis Date   Aortic stenosis    21 mm  Inspiris Edwards AVR June 2024 - [facility]   Arthritis    Cervical incompetence    GERD (gastroesophageal reflux disease)    Hay fever    Heart block    [employer] DCPPM on 10/26/2022 with Dr. Zachery Conch - [facility]   Hemochromatosis    HOCM (hypertrophic obstructive cardiomyopathy) (HCC)    Septal myomectomy June 2024 - [facility]   Hypertension    Migraines    Mitral stenosis    25 mm [employer] mechanical MVR June 2024 - [facility]   Paroxysmal atrial fibrillation (HCC)    Sciatica    Type 2 diabetes mellitus (HCC)     Tobacco Use: Social History   Tobacco Use  Smoking Status Former   Current packs/day: 0.00   Average packs/day: 0.1 packs/day for 7.0 years (0.7 ttl pk-yrs)   Types: Cigarettes   Start date: 09/16/1962   Quit date: 09/15/1969   Years since quitting: 53.7  Smokeless Tobacco Never  Tobacco Comments   as a teenager    Labs: Review Flowsheet       Latest Ref Rng & Units 07/28/2020 10/01/2021 01/31/2023  Labs for ITP Cardiac and Pulmonary Rehab  Hemoglobin A1c 4.8 - 5.6 % - 6.8  7.5   Bicarbonate 20.0 - 28.0 mmol/L 27.7  - -  TCO2 22 - 32 mmol/L 29  - -  O2 Saturation %  75.0  - -    Capillary Blood Glucose: Lab Results  Component Value Date   GLUCAP 114 (H) 04/28/2023   GLUCAP 162 (H) 04/28/2023   GLUCAP 117 (H) 04/26/2023   GLUCAP 153 (H) 04/26/2023   GLUCAP 147 (H) 02/02/2023     Exercise Target Goals: Exercise Program Goal: Individual exercise prescription set using results from initial 6 min walk test and THRR while considering  patient's activity barriers and safety.   Exercise Prescription Goal: Starting with aerobic activity 30 plus minutes a day, 3 days per week for initial exercise prescription. Provide home exercise prescription and guidelines that participant acknowledges understanding prior to discharge.  Activity Barriers & Risk Stratification:  Activity Barriers & Cardiac Risk Stratification - 04/18/23 1241       Activity Barriers & Cardiac Risk  Stratification   Activity Barriers Balance Concerns;History of Falls;Assistive Device;Deconditioning;Muscular Weakness;Shortness of Breath;Left Knee Replacement;Other (comment)    Comments ringing in her ears is disorienting    Cardiac Risk Stratification High             6 Minute Walk:  6 Minute Walk     Row Name 04/18/23 1403         6 Minute Walk   Phase Initial     Distance 630 feet     Walk Time 4.43 minutes     # of Rest Breaks 2  1:04, 30sec     MPH 1.61     METS 0.96     RPE 15     VO2 Peak 3.38     Symptoms Yes (comment)     Comments fatigue, knee pain     Resting HR 63 bpm     Resting BP 128/74     Resting Oxygen Saturation  96 %     Exercise Oxygen Saturation  during 6 min walk 92 %     Max Ex. HR 83 bpm     Max Ex. BP 138/84     2 Minute Post BP 122/74              Oxygen Initial Assessment:   Oxygen Re-Evaluation:   Oxygen Discharge (Final Oxygen Re-Evaluation):   Initial Exercise Prescription:  Initial Exercise Prescription - 04/18/23 1400       Date of Initial Exercise RX and Referring Provider   Date 04/18/23    Referring Provider Luane School MD      Oxygen   Maintain Oxygen Saturation 88% or higher      NuStep   Level 1    SPM 60    Minutes 30    METs 1.5      Prescription Details   Frequency (times per week) 2    Duration Progress to 30 minutes of continuous aerobic without signs/symptoms of physical distress      Intensity   THRR 40-80% of Max Heartrate 96-129    Ratings of Perceived Exertion 11-13    Perceived Dyspnea 0-4      Progression   Progression Continue to progress workloads to maintain intensity without signs/symptoms of physical distress.      Resistance Training   Training Prescription Yes    Weight 2lb    Reps 10-15             Perform Capillary Blood Glucose checks as needed.  Exercise Prescription Changes:   Exercise Prescription Changes     Row Name 04/18/23 1400 04/28/23 1200  05/19/23 1400  Response to Exercise   Blood Pressure (Admit) 128/74 142/70 136/72     Blood Pressure (Exercise) 138/84 125/55 138/72     Blood Pressure (Exit) 122/74 140/72 122/60     Heart Rate (Admit) 63 bpm 57 bpm 55 bpm     Heart Rate (Exercise) 83 bpm 104 bpm 103 bpm     Heart Rate (Exit) 63 bpm 64 bpm 60 bpm     Oxygen Saturation (Admit) 96 % -- --     Oxygen Saturation (Exercise) 92 % -- --     Rating of Perceived Exertion (Exercise) 15 13 12      Symptoms fatigue, chronic knee pain -- --     Comments walk test results -- --     Duration -- Continue with 30 min of aerobic exercise without signs/symptoms of physical distress. Continue with 30 min of aerobic exercise without signs/symptoms of physical distress.     Intensity -- THRR unchanged THRR unchanged       Progression   Progression -- Continue to progress workloads to maintain intensity without signs/symptoms of physical distress. Continue to progress workloads to maintain intensity without signs/symptoms of physical distress.       Resistance Training   Training Prescription -- Yes Yes     Weight -- 2 lbs 3     Reps -- 10-15 10-15       NuStep   Level -- 1 1     SPM -- 51 48     Minutes -- 30 30     METs -- 1.7 1.6              Exercise Comments:   Exercise Goals and Review:   Exercise Goals     Row Name 04/18/23 1405             Exercise Goals   Increase Physical Activity Yes       Intervention Provide advice, education, support and counseling about physical activity/exercise needs.;Develop an individualized exercise prescription for aerobic and resistive training based on initial evaluation findings, risk stratification, comorbidities and participant's personal goals.       Expected Outcomes Short Term: Attend rehab on a regular basis to increase amount of physical activity.;Long Term: Add in home exercise to make exercise part of routine and to increase amount of physical activity.;Long  Term: Exercising regularly at least 3-5 days a week.       Increase Strength and Stamina Yes       Intervention Provide advice, education, support and counseling about physical activity/exercise needs.;Develop an individualized exercise prescription for aerobic and resistive training based on initial evaluation findings, risk stratification, comorbidities and participant's personal goals.       Expected Outcomes Short Term: Increase workloads from initial exercise prescription for resistance, speed, and METs.;Short Term: Perform resistance training exercises routinely during rehab and add in resistance training at home;Long Term: Improve cardiorespiratory fitness, muscular endurance and strength as measured by increased METs and functional capacity ( )       Able to understand and use rate of perceived exertion (RPE) scale Yes       Intervention Provide education and explanation on how to use RPE scale       Expected Outcomes Short Term: Able to use RPE daily in rehab to express subjective intensity level;Long Term:  Able to use RPE to guide intensity level when exercising independently       Able to understand and use Dyspnea scale Yes  Intervention Provide education and explanation on how to use Dyspnea scale       Expected Outcomes Short Term: Able to use Dyspnea scale daily in rehab to express subjective sense of shortness of breath during exertion;Long Term: Able to use Dyspnea scale to guide intensity level when exercising independently       Knowledge and understanding of Target Heart Rate Range (THRR) Yes       Intervention Provide education and explanation of THRR including how the numbers were predicted and where they are located for reference       Expected Outcomes Long Term: Able to use THRR to govern intensity when exercising independently;Short Term: Able to state/look up THRR;Short Term: Able to use daily as guideline for intensity in rehab       Able to check pulse independently  Yes       Intervention Review the importance of being able to check your own pulse for safety during independent exercise;Provide education and demonstration on how to check pulse in carotid and radial arteries.       Expected Outcomes Short Term: Able to explain why pulse checking is important during independent exercise;Long Term: Able to check pulse independently and accurately       Understanding of Exercise Prescription Yes       Intervention Provide education, explanation, and written materials on patient's individual exercise prescription       Expected Outcomes Short Term: Able to explain program exercise prescription;Long Term: Able to explain home exercise prescription to exercise independently                Exercise Goals Re-Evaluation :  Exercise Goals Re-Evaluation     Row Name 05/02/23 0817 05/05/23 1341           Exercise Goal Re-Evaluation   Exercise Goals Review Increase Physical Activity;Increase Strength and Stamina;Understanding of Exercise Prescription Increase Physical Activity;Increase Strength and Stamina;Understanding of Exercise Prescription      Comments Tisheena is just starting rehab, she has been here for 2 visits. She is tolerating exercise and getting use to the equipment. Will continue to montior and progress as able. Marlene is doing well in rehab. She has just started the program so she is gettting use to exercising. She is feeling sore after each visit the day after but it is getting better. Since she has only been her for 3 visits she has not felt any increase in stamina yet. She is enjoying coming to class and likes that she is able to exercise.      Expected Outcomes Continue to attend rehab short: increase speed on nustep  long term: continue to attend exercise                Discharge Exercise Prescription (Final Exercise Prescription Changes):  Exercise Prescription Changes - 05/19/23 1400       Response to Exercise   Blood Pressure (Admit)  136/72    Blood Pressure (Exercise) 138/72    Blood Pressure (Exit) 122/60    Heart Rate (Admit) 55 bpm    Heart Rate (Exercise) 103 bpm    Heart Rate (Exit) 60 bpm    Rating of Perceived Exertion (Exercise) 12    Duration Continue with 30 min of aerobic exercise without signs/symptoms of physical distress.    Intensity THRR unchanged      Progression   Progression Continue to progress workloads to maintain intensity without signs/symptoms of physical distress.      Resistance Training  Training Prescription Yes    Weight 3    Reps 10-15      NuStep   Level 1    SPM 48    Minutes 30    METs 1.6             Nutrition:  Target Goals: Understanding of nutrition guidelines, daily intake of sodium 1500mg , cholesterol 200mg , calories 30% from fat and 7% or less from saturated fats, daily to have 5 or more servings of fruits and vegetables.  Biometrics:  Pre Biometrics - 04/18/23 1406       Pre Biometrics   Height 5\' 8"  (1.727 m)    Weight 229 lb 14.4 oz (104.3 kg)    Waist Circumference 45 inches    Hip Circumference 48.5 inches    Waist to Hip Ratio 0.93 %    BMI (Calculated) 34.96    Grip Strength 17.8 kg    Single Leg Stand 2.4 seconds              Nutrition Therapy Plan and Nutrition Goals:  Nutrition Therapy & Goals - 04/18/23 1411       Intervention Plan   Intervention Prescribe, educate and counsel regarding individualized specific dietary modifications aiming towards targeted core components such as weight, hypertension, lipid management, diabetes, heart failure and other comorbidities.;Nutrition handout(s) given to patient.    Expected Outcomes Short Term Goal: Understand basic principles of dietary content, such as calories, fat, sodium, cholesterol and nutrients.;Long Term Goal: Adherence to prescribed nutrition plan.             Nutrition Assessments:  MEDIFICTS Score Key: >=70 Need to make dietary changes  40-70 Heart Healthy Diet <=  40 Therapeutic Level Cholesterol Diet  Flowsheet Row CARDIAC REHAB PHASE II ORIENTATION from 04/18/2023 in [facility] CARDIAC REHABILITATION  Picture Your Plate Total Score on Admission 56      Picture Your Plate Scores: <54 Unhealthy dietary pattern with much room for improvement. 41-50 Dietary pattern unlikely to meet recommendations for good health and room for improvement. 51-60 More healthful dietary pattern, with some room for improvement.  >60 Healthy dietary pattern, although there may be some specific behaviors that could be improved.    Nutrition Goals Re-Evaluation:  Nutrition Goals Re-Evaluation     Row Name 05/05/23 1347             Goals   Current Weight 224 lb (101.6 kg)       Nutrition Goal Healthy eating       Comment Jacqulene is doing well in rehab. IN the morinngd she eats ceraol or oatmeal, lunch is normally tomatoa and some cheese. Dinner she is eating seafood  (whiting, cod, scallops and shrimp). She does try to eat more chicken then beef. When she gets beef she is always getting lean cuts or lean groung. She has cut down on sweets. She did eat more sweets then normal during the holidays. She drinks water or unsweetned tea at home. She will have a soda everynow and then when she is out. She is trying to be mindful of portion control and also snacking. She also does not eat bread a lot.       Expected Outcome Short: watch portion control    long term: continue to eat and pick healthy options                Nutrition Goals Discharge (Final Nutrition Goals Re-Evaluation):  Nutrition Goals Re-Evaluation - 05/05/23 1347  Goals   Current Weight 224 lb (101.6 kg)    Nutrition Goal Healthy eating    Comment Dawnn is doing well in rehab. IN the morinngd she eats ceraol or oatmeal, lunch is normally tomatoa and some cheese. Dinner she is eating seafood  (whiting, cod, scallops and shrimp). She does try to eat more chicken then beef. When she gets beef she is  always getting lean cuts or lean groung. She has cut down on sweets. She did eat more sweets then normal during the holidays. She drinks water or unsweetned tea at home. She will have a soda everynow and then when she is out. She is trying to be mindful of portion control and also snacking. She also does not eat bread a lot.    Expected Outcome Short: watch portion control    long term: continue to eat and pick healthy options             Psychosocial: Target Goals: Acknowledge presence or absence of significant depression and/or stress, maximize coping skills, provide positive support system. Participant is able to verbalize types and ability to use techniques and skills needed for reducing stress and depression.  Initial Review & Psychosocial Screening:  Initial Psych Review & Screening - 04/18/23 1244       Initial Review   Current issues with Current Stress Concerns    Source of Stress Concerns Chronic Illness;Unable to participate in former interests or hobbies;Unable to perform yard/household activities    Comments unable to do everything she wants, politics is scary, concerned for her future      Family Dynamics   Good Support System? Yes   husband, neighbors, family lives in [place]     Barriers   Psychosocial barriers to participate in program Psychosocial barriers identified (see note);The patient should benefit from training in stress management and relaxation.      Screening Interventions   Interventions Encouraged to exercise;Provide feedback about the scores to participant    Expected Outcomes Short Term goal: Utilizing psychosocial counselor, staff and physician to assist with identification of specific Stressors or current issues interfering with healing process. Setting desired goal for each stressor or current issue identified.;Long Term Goal: Stressors or current issues are controlled or eliminated.;Short Term goal: Identification and review with participant of any  Quality of Life or Depression concerns found by scoring the questionnaire.;Long Term goal: The participant improves quality of Life and PHQ9 Scores as seen by post scores and/or verbalization of changes             Quality of Life Scores:  Quality of Life - 04/18/23 1411       Quality of Life   Select Quality of Life      Quality of Life Scores   Health/Function Pre 22.1 %    Socioeconomic Pre 26.25 %    Psych/Spiritual Pre 25.5 %    Family Pre 23.11 %    GLOBAL Pre 23.91 %            Scores of 19 and below usually indicate a poorer quality of life in these areas.  A difference of  2-3 points is a clinically meaningful difference.  A difference of 2-3 points in the total score of the Quality of Life Index has been associated with significant improvement in overall quality of life, self-image, physical symptoms, and general health in studies assessing change in quality of life.  PHQ-9: Review Flowsheet       04/18/2023  Depression  screen PHQ 2/9  Decreased Interest 0  Down, Depressed, Hopeless 0  PHQ - 2 Score 0  Altered sleeping 0  Tired, decreased energy 1  Change in appetite 0  Feeling bad or failure about yourself  0  Trouble concentrating 2  Moving slowly or fidgety/restless 0  Suicidal thoughts 0  PHQ-9 Score 3  Difficult doing work/chores Somewhat difficult   Interpretation of Total Score  Total Score Depression Severity:  1-4 = Minimal depression, 5-9 = Mild depression, 10-14 = Moderate depression, 15-19 = Moderately severe depression, 20-27 = Severe depression   Psychosocial Evaluation and Intervention:  Psychosocial Evaluation - 04/18/23 1406       Psychosocial Evaluation & Interventions   Interventions Encouraged to exercise with the program and follow exercise prescription;Stress management education    Comments Kabria is coming into cardiac rehab after two valve replacements.  She has had a rough go since her surgery.  She also has gotten a  pacemaker and a couple of cardioversions.  She was in and out of afib after surgery but she is now in rhythm and feeling better.  She is eager to get going with rehab and wants to be able to go and do again.  She would like to move better and build up her confidence to get out and about.  Since surgery, she has let fear hold her back as she does not want to get herself in trouble.  She sleeps well, but has gotten into a bad habit of staying up late watching TV and then sleeping til 9am.  She does not preceive any barriers to attending rehab.  She enjoys going to the movies and getting out for a drive.    Expected Outcomes Short: Attend rehab to build stamina Long: Build confidence that she can do things.    Continue Psychosocial Services  Follow up required by staff             Psychosocial Re-Evaluation:  Psychosocial Re-Evaluation     Row Name 05/05/23 1344             Psychosocial Re-Evaluation   Current issues with Current Sleep Concerns       Comments Jhoselin is doing well in rehab. She stated that she does wake up early in the morning due to her dogs waking her up and then she does go back to bed. She does not wake up to use the restroom or any other issues. She stated that she does not have any stress concerns in her life and is doing well       Expected Outcomes short: make sure she is getting addiquet sleep   long term: continue to have no stress       Interventions Encouraged to attend Pulmonary Rehabilitation for the exercise       Continue Psychosocial Services  Follow up required by staff                Psychosocial Discharge (Final Psychosocial Re-Evaluation):  Psychosocial Re-Evaluation - 05/05/23 1344       Psychosocial Re-Evaluation   Current issues with Current Sleep Concerns    Comments Damari is doing well in rehab. She stated that she does wake up early in the morning due to her dogs waking her up and then she does go back to bed. She does not wake up to use  the restroom or any other issues. She stated that she does not have any stress concerns in her life and  is doing well    Expected Outcomes short: make sure she is getting addiquet sleep   long term: continue to have no stress    Interventions Encouraged to attend Pulmonary Rehabilitation for the exercise    Continue Psychosocial Services  Follow up required by staff             Vocational Rehabilitation: Provide vocational rehab assistance to qualifying candidates.   Vocational Rehab Evaluation & Intervention:  Vocational Rehab - 04/18/23 1242       Initial Vocational Rehab Evaluation & Intervention   Assessment shows need for Vocational Rehabilitation No   retired            Education: Education Goals: Education classes will be provided on a weekly basis, covering required topics. Participant will state understanding/return demonstration of topics presented.  Learning Barriers/Preferences:  Learning Barriers/Preferences - 04/18/23 1239       Learning Barriers/Preferences   Learning Barriers Sight   glasses   Learning Preferences Skilled Demonstration;Written Material             Education Topics: Hypertension, Hypertension Reduction -Define heart disease and high blood pressure. Discus how high blood pressure affects the body and ways to reduce high blood pressure.   Exercise and Your Heart -Discuss why it is important to exercise, the FITT principles of exercise, normal and abnormal responses to exercise, and how to exercise safely.   Angina -Discuss definition of angina, causes of angina, treatment of angina, and how to decrease risk of having angina.   Cardiac Medications -Review what the following cardiac medications are used for, how they affect the body, and side effects that may occur when taking the medications.  Medications include Aspirin, Beta blockers, calcium channel blockers, ACE Inhibitors, angiotensin receptor blockers, diuretics, digoxin,  and antihyperlipidemics. Flowsheet Row CARDIAC REHAB PHASE II EXERCISE from 05/19/2023 in [place] CARDIAC REHABILITATION  Date 05/19/23  Educator HB  Instruction Review Code 1- Verbalizes Understanding       Congestive Heart Failure -Discuss the definition of CHF, how to live with CHF, the signs and symptoms of CHF, and how keep track of weight and sodium intake.   Heart Disease and Intimacy -Discus the effect sexual activity has on the heart, how changes occur during intimacy as we age, and safety during sexual activity.   Smoking Cessation / COPD -Discuss different methods to quit smoking, the health benefits of quitting smoking, and the definition of COPD.   Nutrition I: Fats -Discuss the types of cholesterol, what cholesterol does to the heart, and how cholesterol levels can be controlled.   Nutrition II: Labels -Discuss the different components of food labels and how to read food label   Heart Parts/Heart Disease and PAD -Discuss the anatomy of the heart, the pathway of blood circulation through the heart, and these are affected by heart disease.   Stress I: Signs and Symptoms -Discuss the causes of stress, how stress may lead to anxiety and depression, and ways to limit stress. Flowsheet Row CARDIAC REHAB PHASE II EXERCISE from 05/19/2023 in [place] CARDIAC REHABILITATION  Date 05/05/23  Educator [facility]  Instruction Review Code 1- Verbalizes Understanding       Stress II: Relaxation -Discuss different types of relaxation techniques to limit stress.   Warning Signs of Stroke / TIA -Discuss definition of a stroke, what the signs and symptoms are of a stroke, and how to identify when someone is having stroke.   Knowledge Questionnaire Score:  Knowledge Questionnaire Score -  04/18/23 1412       Knowledge Questionnaire Score   Pre Score 23/26             Core Components/Risk Factors/Patient Goals at Admission:  Personal Goals and Risk Factors at  Admission - 04/18/23 1412       Core Components/Risk Factors/Patient Goals on Admission    Weight Management Yes;Obesity;Weight Loss    Intervention Weight Management: Develop a combined nutrition and exercise program designed to reach desired caloric intake, while maintaining appropriate intake of nutrient and fiber, sodium and fats, and appropriate energy expenditure required for the weight goal.;Weight Management: Provide education and appropriate resources to help participant work on and attain dietary goals.;Weight Management/Obesity: Establish reasonable short term and long term weight goals.;Obesity: Provide education and appropriate resources to help participant work on and attain dietary goals.    Admit Weight 229 lb 14.4 oz (104.3 kg)    Goal Weight: Short Term 225 lb (102.1 kg)    Goal Weight: Long Term 220 lb (99.8 kg)    Expected Outcomes Short Term: Continue to assess and modify interventions until short term weight is achieved;Long Term: Adherence to nutrition and physical activity/exercise program aimed toward attainment of established weight goal;Weight Loss: Understanding of general recommendations for a balanced deficit meal plan, which promotes 1-2 lb weight loss per week and includes a negative energy balance of [phone] kcal/d;Understanding recommendations for meals to include 15-35% energy as protein, 25-35% energy from fat, 35-60% energy from carbohydrates, less than 200mg  of dietary cholesterol, 20-35 gm of total fiber daily;Understanding of distribution of calorie intake throughout the day with the consumption of 4-5 meals/snacks    Improve shortness of breath with ADL's Yes    Intervention Provide education, individualized exercise plan and daily activity instruction to help decrease symptoms of SOB with activities of daily living.    Expected Outcomes Short Term: Improve cardiorespiratory fitness to achieve a reduction of symptoms when performing ADLs;Long Term: Be able to  perform more ADLs without symptoms or delay the onset of symptoms    Diabetes Yes    Intervention Provide education about signs/symptoms and action to take for hypo/hyperglycemia.;Provide education about proper nutrition, including hydration, and aerobic/resistive exercise prescription along with prescribed medications to achieve blood glucose in normal ranges: Fasting glucose 65-99 mg/dL    Expected Outcomes Short Term: Participant verbalizes understanding of the signs/symptoms and immediate care of hyper/hypoglycemia, proper foot care and importance of medication, aerobic/resistive exercise and nutrition plan for blood glucose control.;Long Term: Attainment of HbA1C < 7%.    Hypertension Yes    Intervention Provide education on lifestyle modifcations including regular physical activity/exercise, weight management, moderate sodium restriction and increased consumption of fresh fruit, vegetables, and low fat dairy, alcohol moderation, and smoking cessation.;Monitor prescription use compliance.    Expected Outcomes Short Term: Continued assessment and intervention until BP is < 140/60mm HG in hypertensive participants. < 130/33mm HG in hypertensive participants with diabetes, heart failure or chronic kidney disease.;Long Term: Maintenance of blood pressure at goal levels.             Core Components/Risk Factors/Patient Goals Review:   Goals and Risk Factor Review     Row Name 05/05/23 1354 05/05/23 1355           Core Components/Risk Factors/Patient Goals Review   Personal Goals Review Weight Management/Obesity;Improve shortness of breath with ADL's;Diabetes Weight Management/Obesity;Improve shortness of breath with ADL's;Diabetes      Review -- Lil has been doing well in rehab.  She has noticed her SOB has gotten a little better. She does not check her sugar at home we have been checking it the first visits and it has been WNL. Her weight has been stable.      Expected Outcomes -- Short:  monior weight everyday  long term: exercise and eat healthy for weight managmenet               Core Components/Risk Factors/Patient Goals at Discharge (Final Review):   Goals and Risk Factor Review - 05/05/23 1355       Core Components/Risk Factors/Patient Goals Review   Personal Goals Review Weight Management/Obesity;Improve shortness of breath with ADL's;Diabetes    Review Twyla has been doing well in rehab. She has noticed her SOB has gotten a little better. She does not check her sugar at home we have been checking it the first visits and it has been WNL. Her weight has been stable.    Expected Outcomes Short: monior weight everyday  long term: exercise and eat healthy for weight managmenet             ITP Comments:  ITP Comments     Row Name 04/18/23 1402 04/26/23 1337 04/26/23 1434 05/25/23 1137     ITP Comments Patient attend orientation today.  Patient is attending Cardiac Rehabilitation Program.  Documentation for diagnosis can be found in [facility] 11/10/22.  Reviewed medical chart, RPE/RPD, gym safety, and program guidelines.  Patient was fitted to equipment they will be using during rehab.  Patient is scheduled to start exercise on Tuesday 04/26/23 at 1330.   Initial ITP created and sent for review and signature by Dr. Dina Rich, Medical Director for Cardiac Rehabilitation Program. First full day of exercise!  Patient was oriented to gym and equipment including functions, settings, policies, and procedures.  Patient's individual exercise prescription and treatment plan were reviewed.  All starting workloads were established based on the results of the 6 minute walk test done at initial orientation visit.  The plan for exercise progression was also introduced and progression will be customized based on patient's performance and goals. 30 day review completed. ITP sent to Dr. Dina Rich, Medical Director of Cardiac Rehab. Continue with ITP unless changes are made by  physician.  New to program, just finished her first exercise session today. 30 day review completed. ITP sent to Dr. Dina Rich, Medical Director of Cardiac Rehab. Continue with ITP unless changes are made by physician.             Comments: 30 day review

## 2023-05-26 ENCOUNTER — Encounter (HOSPITAL_COMMUNITY)
Admission: RE | Admit: 2023-05-26 | Discharge: 2023-05-26 | Disposition: A | Discharge status: Home / Self Care | Payer: Medicare Other | Source: Ambulatory Visit | Attending: Internal Medicine | Admitting: Internal Medicine

## 2023-05-26 DIAGNOSIS — Z952 Presence of prosthetic heart valve: Secondary | ICD-10-CM | POA: Diagnosis not present

## 2023-05-26 DIAGNOSIS — Z950 Presence of cardiac pacemaker: Secondary | ICD-10-CM

## 2023-05-26 NOTE — Progress Notes (Signed)
Daily Session Note  Patient Details  Name: OTILLIA CORDONE MRN: [id number] Date of Birth: 09/02/1950 Referring Provider:   Flowsheet Row CARDIAC REHAB PHASE II ORIENTATION from 04/18/2023 in [facility] CARDIAC REHABILITATION  Referring Provider Luane School MD       Encounter Date: 05/26/2023  Check In:  Session Check In - 05/26/23 1330       Check-In   Supervising physician immediately available to respond to emergencies See telemetry face sheet for immediately available MD    Location [facility]    Staff Present Ross Ludwig, BS, Exercise Physiologist;Jessica Juanetta Gosling, MA, RCEP, CCRP, Gennie Alma, RN, BSN    Virtual Visit No    Medication changes reported     No    Fall or balance concerns reported    No    Tobacco Cessation No Change    Warm-up and Cool-down Performed on first and last piece of equipment    Resistance Training Performed Yes    VAD Patient? No    PAD/SET Patient? No      Pain Assessment   Currently in Pain? No/denies    Multiple Pain Sites No             Capillary Blood Glucose: No results found for this or any previous visit (from the past 24 hours).    Social History   Tobacco Use  Smoking Status Former   Current packs/day: 0.00   Average packs/day: 0.1 packs/day for 7.0 years (0.7 ttl pk-yrs)   Types: Cigarettes   Start date: 09/16/1962   Quit date: 09/15/1969   Years since quitting: 53.7  Smokeless Tobacco Never  Tobacco Comments   as a teenager    Goals Met:  Independence with exercise equipment Exercise tolerated well No report of concerns or symptoms today Strength training completed today  Goals Unmet:  Not Applicable  Comments: Pt able to follow exercise prescription today without complaint.  Will continue to monitor for progression.

## 2023-05-31 ENCOUNTER — Encounter (HOSPITAL_COMMUNITY): Payer: Medicare Other

## 2023-06-02 ENCOUNTER — Encounter (HOSPITAL_COMMUNITY): Payer: Medicare Other

## 2023-06-06 ENCOUNTER — Ambulatory Visit: Payer: Medicare Other | Attending: Cardiovascular Disease | Admitting: *Deleted

## 2023-06-06 DIAGNOSIS — Z952 Presence of prosthetic heart valve: Secondary | ICD-10-CM | POA: Diagnosis not present

## 2023-06-06 DIAGNOSIS — Z5181 Encounter for therapeutic drug level monitoring: Secondary | ICD-10-CM | POA: Diagnosis not present

## 2023-06-06 LAB — POCT INR: INR: 3 (ref 2.0–3.0)

## 2023-06-06 NOTE — Patient Instructions (Signed)
 S/P TEE/DCCV on 02/02/23 Continue warfarin 1 tablet daily except 1/2 tablet on Tuesdays and Saturdays On amiodarone  200mg  daily Recheck in 4 wks

## 2023-06-07 ENCOUNTER — Encounter (HOSPITAL_COMMUNITY): Payer: Medicare Other

## 2023-06-09 ENCOUNTER — Encounter (HOSPITAL_COMMUNITY): Admission: RE | Admit: 2023-06-09 | Payer: Medicare Other | Source: Ambulatory Visit

## 2023-06-11 ENCOUNTER — Inpatient Hospital Stay (HOSPITAL_COMMUNITY): Payer: Medicare Other

## 2023-06-11 ENCOUNTER — Other Ambulatory Visit: Payer: Self-pay

## 2023-06-11 ENCOUNTER — Inpatient Hospital Stay (HOSPITAL_COMMUNITY)
Admission: EM | Admit: 2023-06-11 | Discharge: 2023-06-17 | DRG: 481 | Disposition: A | Discharge status: Skilled Nursing Facility | Payer: Medicare Other | Attending: Internal Medicine | Admitting: Internal Medicine

## 2023-06-11 ENCOUNTER — Emergency Department (HOSPITAL_COMMUNITY): Payer: Medicare Other

## 2023-06-11 ENCOUNTER — Encounter (HOSPITAL_COMMUNITY): Payer: Self-pay

## 2023-06-11 DIAGNOSIS — Z888 Allergy status to other drugs, medicaments and biological substances status: Secondary | ICD-10-CM

## 2023-06-11 DIAGNOSIS — Z800 Family history of malignant neoplasm of digestive organs: Secondary | ICD-10-CM

## 2023-06-11 DIAGNOSIS — R2689 Other abnormalities of gait and mobility: Secondary | ICD-10-CM | POA: Diagnosis not present

## 2023-06-11 DIAGNOSIS — Z79899 Other long term (current) drug therapy: Secondary | ICD-10-CM | POA: Diagnosis not present

## 2023-06-11 DIAGNOSIS — Z87891 Personal history of nicotine dependence: Secondary | ICD-10-CM | POA: Diagnosis not present

## 2023-06-11 DIAGNOSIS — E1122 Type 2 diabetes mellitus with diabetic chronic kidney disease: Secondary | ICD-10-CM | POA: Diagnosis not present

## 2023-06-11 DIAGNOSIS — I9581 Postprocedural hypotension: Secondary | ICD-10-CM | POA: Diagnosis not present

## 2023-06-11 DIAGNOSIS — K5900 Constipation, unspecified: Secondary | ICD-10-CM | POA: Diagnosis not present

## 2023-06-11 DIAGNOSIS — S7291XA Unspecified fracture of right femur, initial encounter for closed fracture: Secondary | ICD-10-CM | POA: Diagnosis present

## 2023-06-11 DIAGNOSIS — Z7984 Long term (current) use of oral hypoglycemic drugs: Secondary | ICD-10-CM

## 2023-06-11 DIAGNOSIS — Z803 Family history of malignant neoplasm of breast: Secondary | ICD-10-CM | POA: Diagnosis not present

## 2023-06-11 DIAGNOSIS — I4821 Permanent atrial fibrillation: Secondary | ICD-10-CM | POA: Diagnosis not present

## 2023-06-11 DIAGNOSIS — I10 Essential (primary) hypertension: Secondary | ICD-10-CM | POA: Diagnosis not present

## 2023-06-11 DIAGNOSIS — S7291XD Unspecified fracture of right femur, subsequent encounter for closed fracture with routine healing: Secondary | ICD-10-CM | POA: Diagnosis not present

## 2023-06-11 DIAGNOSIS — S72101D Unspecified trochanteric fracture of right femur, subsequent encounter for closed fracture with routine healing: Secondary | ICD-10-CM | POA: Diagnosis not present

## 2023-06-11 DIAGNOSIS — R0602 Shortness of breath: Secondary | ICD-10-CM | POA: Diagnosis not present

## 2023-06-11 DIAGNOSIS — E1165 Type 2 diabetes mellitus with hyperglycemia: Secondary | ICD-10-CM | POA: Diagnosis not present

## 2023-06-11 DIAGNOSIS — Z6835 Body mass index (BMI) 35.0-35.9, adult: Secondary | ICD-10-CM

## 2023-06-11 DIAGNOSIS — Y92008 Other place in unspecified non-institutional (private) residence as the place of occurrence of the external cause: Secondary | ICD-10-CM

## 2023-06-11 DIAGNOSIS — Z7901 Long term (current) use of anticoagulants: Secondary | ICD-10-CM | POA: Diagnosis not present

## 2023-06-11 DIAGNOSIS — R1319 Other dysphagia: Secondary | ICD-10-CM | POA: Diagnosis not present

## 2023-06-11 DIAGNOSIS — N179 Acute kidney failure, unspecified: Secondary | ICD-10-CM | POA: Diagnosis not present

## 2023-06-11 DIAGNOSIS — E1169 Type 2 diabetes mellitus with other specified complication: Secondary | ICD-10-CM | POA: Diagnosis not present

## 2023-06-11 DIAGNOSIS — R6889 Other general symptoms and signs: Secondary | ICD-10-CM | POA: Diagnosis not present

## 2023-06-11 DIAGNOSIS — I421 Obstructive hypertrophic cardiomyopathy: Secondary | ICD-10-CM | POA: Diagnosis not present

## 2023-06-11 DIAGNOSIS — E66812 Obesity, class 2: Secondary | ICD-10-CM

## 2023-06-11 DIAGNOSIS — S72141A Displaced intertrochanteric fracture of right femur, initial encounter for closed fracture: Principal | ICD-10-CM | POA: Diagnosis present

## 2023-06-11 DIAGNOSIS — Z950 Presence of cardiac pacemaker: Secondary | ICD-10-CM | POA: Diagnosis not present

## 2023-06-11 DIAGNOSIS — K219 Gastro-esophageal reflux disease without esophagitis: Secondary | ICD-10-CM | POA: Diagnosis present

## 2023-06-11 DIAGNOSIS — M85851 Other specified disorders of bone density and structure, right thigh: Secondary | ICD-10-CM | POA: Diagnosis not present

## 2023-06-11 DIAGNOSIS — Z4789 Encounter for other orthopedic aftercare: Secondary | ICD-10-CM | POA: Diagnosis not present

## 2023-06-11 DIAGNOSIS — R600 Localized edema: Secondary | ICD-10-CM | POA: Diagnosis not present

## 2023-06-11 DIAGNOSIS — S72101A Unspecified trochanteric fracture of right femur, initial encounter for closed fracture: Secondary | ICD-10-CM | POA: Diagnosis not present

## 2023-06-11 DIAGNOSIS — S72001A Fracture of unspecified part of neck of right femur, initial encounter for closed fracture: Secondary | ICD-10-CM | POA: Diagnosis not present

## 2023-06-11 DIAGNOSIS — Z8249 Family history of ischemic heart disease and other diseases of the circulatory system: Secondary | ICD-10-CM | POA: Diagnosis not present

## 2023-06-11 DIAGNOSIS — I4819 Other persistent atrial fibrillation: Secondary | ICD-10-CM | POA: Diagnosis not present

## 2023-06-11 DIAGNOSIS — E559 Vitamin D deficiency, unspecified: Secondary | ICD-10-CM | POA: Diagnosis present

## 2023-06-11 DIAGNOSIS — S72351D Displaced comminuted fracture of shaft of right femur, subsequent encounter for closed fracture with routine healing: Secondary | ICD-10-CM | POA: Diagnosis not present

## 2023-06-11 DIAGNOSIS — Z743 Need for continuous supervision: Secondary | ICD-10-CM | POA: Diagnosis not present

## 2023-06-11 DIAGNOSIS — D62 Acute posthemorrhagic anemia: Secondary | ICD-10-CM | POA: Diagnosis not present

## 2023-06-11 DIAGNOSIS — M8580 Other specified disorders of bone density and structure, unspecified site: Secondary | ICD-10-CM | POA: Diagnosis not present

## 2023-06-11 DIAGNOSIS — N1832 Chronic kidney disease, stage 3b: Secondary | ICD-10-CM

## 2023-06-11 DIAGNOSIS — I129 Hypertensive chronic kidney disease with stage 1 through stage 4 chronic kidney disease, or unspecified chronic kidney disease: Secondary | ICD-10-CM | POA: Diagnosis present

## 2023-06-11 DIAGNOSIS — E785 Hyperlipidemia, unspecified: Secondary | ICD-10-CM | POA: Diagnosis not present

## 2023-06-11 DIAGNOSIS — W19XXXA Unspecified fall, initial encounter: Secondary | ICD-10-CM | POA: Diagnosis not present

## 2023-06-11 DIAGNOSIS — Z7401 Bed confinement status: Secondary | ICD-10-CM | POA: Diagnosis not present

## 2023-06-11 DIAGNOSIS — Z952 Presence of prosthetic heart valve: Secondary | ICD-10-CM | POA: Diagnosis not present

## 2023-06-11 DIAGNOSIS — R531 Weakness: Secondary | ICD-10-CM | POA: Diagnosis not present

## 2023-06-11 DIAGNOSIS — Z96651 Presence of right artificial knee joint: Secondary | ICD-10-CM | POA: Diagnosis present

## 2023-06-11 DIAGNOSIS — M6281 Muscle weakness (generalized): Secondary | ICD-10-CM | POA: Diagnosis not present

## 2023-06-11 DIAGNOSIS — Z471 Aftercare following joint replacement surgery: Secondary | ICD-10-CM | POA: Diagnosis not present

## 2023-06-11 DIAGNOSIS — R278 Other lack of coordination: Secondary | ICD-10-CM | POA: Diagnosis not present

## 2023-06-11 DIAGNOSIS — W108XXA Fall (on) (from) other stairs and steps, initial encounter: Secondary | ICD-10-CM | POA: Diagnosis present

## 2023-06-11 DIAGNOSIS — E119 Type 2 diabetes mellitus without complications: Secondary | ICD-10-CM | POA: Diagnosis not present

## 2023-06-11 LAB — CBC WITH DIFFERENTIAL/PLATELET
Abs Immature Granulocytes: 0.1 10*3/uL — ABNORMAL HIGH (ref 0.00–0.07)
Basophils Absolute: 0.1 10*3/uL (ref 0.0–0.1)
Basophils Relative: 1 %
Eosinophils Absolute: 0.2 10*3/uL (ref 0.0–0.5)
Eosinophils Relative: 1 %
HCT: 35.9 % — ABNORMAL LOW (ref 36.0–46.0)
Hemoglobin: 11.3 g/dL — ABNORMAL LOW (ref 12.0–15.0)
Immature Granulocytes: 1 %
Lymphocytes Relative: 9 %
Lymphs Abs: 1 10*3/uL (ref 0.7–4.0)
MCH: 29.7 pg (ref 26.0–34.0)
MCHC: 31.5 g/dL (ref 30.0–36.0)
MCV: 94.2 fL (ref 80.0–100.0)
Monocytes Absolute: 0.6 10*3/uL (ref 0.1–1.0)
Monocytes Relative: 5 %
Neutro Abs: 9.9 10*3/uL — ABNORMAL HIGH (ref 1.7–7.7)
Neutrophils Relative %: 83 %
Platelets: 225 10*3/uL (ref 150–400)
RBC: 3.81 MIL/uL — ABNORMAL LOW (ref 3.87–5.11)
RDW: 13.5 % (ref 11.5–15.5)
WBC: 11.8 10*3/uL — ABNORMAL HIGH (ref 4.0–10.5)
nRBC: 0 % (ref 0.0–0.2)

## 2023-06-11 LAB — COMPREHENSIVE METABOLIC PANEL
ALT: 27 U/L (ref 0–44)
AST: 26 U/L (ref 15–41)
Albumin: 3.9 g/dL (ref 3.5–5.0)
Alkaline Phosphatase: 80 U/L (ref 38–126)
Anion gap: 11 (ref 5–15)
BUN: 27 mg/dL — ABNORMAL HIGH (ref 8–23)
CO2: 22 mmol/L (ref 22–32)
Calcium: 8.7 mg/dL — ABNORMAL LOW (ref 8.9–10.3)
Chloride: 103 mmol/L (ref 98–111)
Creatinine, Ser: 1.32 mg/dL — ABNORMAL HIGH (ref 0.44–1.00)
GFR, Estimated: 42 mL/min — ABNORMAL LOW (ref >60)
Glucose, Bld: 165 mg/dL — ABNORMAL HIGH (ref 70–99)
Potassium: 4.6 mmol/L (ref 3.5–5.1)
Sodium: 136 mmol/L (ref 135–145)
Total Bilirubin: 0.8 mg/dL (ref 0.0–1.2)
Total Protein: 6.5 g/dL (ref 6.5–8.1)

## 2023-06-11 LAB — I-STAT CHEM 8, ED
BUN: 26 mg/dL — ABNORMAL HIGH (ref 8–23)
Calcium, Ion: 1.09 mmol/L — ABNORMAL LOW (ref 1.15–1.40)
Chloride: 104 mmol/L (ref 98–111)
Creatinine, Ser: 1.5 mg/dL — ABNORMAL HIGH (ref 0.44–1.00)
Glucose, Bld: 159 mg/dL — ABNORMAL HIGH (ref 70–99)
HCT: 36 % (ref 36.0–46.0)
Hemoglobin: 12.2 g/dL (ref 12.0–15.0)
Potassium: 4.7 mmol/L (ref 3.5–5.1)
Sodium: 137 mmol/L (ref 135–145)
TCO2: 23 mmol/L (ref 22–32)

## 2023-06-11 LAB — PROTIME-INR
INR: 2.8 — ABNORMAL HIGH (ref 0.8–1.2)
Prothrombin Time: 30 s — ABNORMAL HIGH (ref 11.4–15.2)

## 2023-06-11 LAB — GLUCOSE, CAPILLARY: Glucose-Capillary: 208 mg/dL — ABNORMAL HIGH (ref 70–99)

## 2023-06-11 LAB — TYPE AND SCREEN
ABO/RH(D): B POS
Antibody Screen: NEGATIVE

## 2023-06-11 LAB — CBG MONITORING, ED: Glucose-Capillary: 140 mg/dL — ABNORMAL HIGH (ref 70–99)

## 2023-06-11 MED ORDER — AMIODARONE HCL 200 MG PO TABS
200.0000 mg | ORAL_TABLET | Freq: Every day | ORAL | Status: DC
Start: 2023-06-11 — End: 2023-06-17
  Administered 2023-06-11 – 2023-06-17 (×7): 200 mg via ORAL
  Filled 2023-06-11 (×7): qty 1

## 2023-06-11 MED ORDER — METOPROLOL SUCCINATE ER 25 MG PO TB24
25.0000 mg | ORAL_TABLET | Freq: Every day | ORAL | Status: DC
  Administered 2023-06-11 – 2023-06-17 (×6): 25 mg via ORAL
  Filled 2023-06-11 (×6): qty 1

## 2023-06-11 MED ORDER — INSULIN ASPART 100 UNIT/ML IJ SOLN
0.0000 [IU] | Freq: Three times a day (TID) | INTRAMUSCULAR | Status: DC
  Administered 2023-06-11 – 2023-06-12 (×2): 1 [IU] via SUBCUTANEOUS
  Administered 2023-06-12: 7 [IU] via SUBCUTANEOUS
  Administered 2023-06-13: 5 [IU] via SUBCUTANEOUS
  Administered 2023-06-13: 2 [IU] via SUBCUTANEOUS
  Administered 2023-06-13: 3 [IU] via SUBCUTANEOUS
  Administered 2023-06-14 (×3): 2 [IU] via SUBCUTANEOUS
  Administered 2023-06-15 (×2): 3 [IU] via SUBCUTANEOUS
  Administered 2023-06-15: 1 [IU] via SUBCUTANEOUS
  Administered 2023-06-16 (×2): 3 [IU] via SUBCUTANEOUS
  Administered 2023-06-16: 2 [IU] via SUBCUTANEOUS
  Administered 2023-06-17 (×2): 3 [IU] via SUBCUTANEOUS
  Administered 2023-06-17: 2 [IU] via SUBCUTANEOUS
  Filled 2023-06-11: qty 1

## 2023-06-11 MED ORDER — VITAMIN K1 10 MG/ML IJ SOLN
1.0000 mg | Freq: Once | INTRAVENOUS | Status: AC
  Administered 2023-06-11: 1 mg via INTRAVENOUS
  Filled 2023-06-11 (×2): qty 0.1

## 2023-06-11 MED ORDER — PANTOPRAZOLE SODIUM 40 MG PO TBEC
40.0000 mg | DELAYED_RELEASE_TABLET | Freq: Every day | ORAL | Status: DC
  Administered 2023-06-11 – 2023-06-17 (×6): 40 mg via ORAL
  Filled 2023-06-11 (×6): qty 1

## 2023-06-11 MED ORDER — ACETAMINOPHEN 650 MG RE SUPP: 650.0000 mg | Freq: Four times a day (QID) | RECTAL | Status: DC | PRN

## 2023-06-11 MED ORDER — LOSARTAN POTASSIUM 25 MG PO TABS
25.0000 mg | ORAL_TABLET | Freq: Every day | ORAL | Status: DC
  Administered 2023-06-11 – 2023-06-13 (×3): 25 mg via ORAL
  Filled 2023-06-11 (×3): qty 1

## 2023-06-11 MED ORDER — OXYCODONE HCL 5 MG PO TABS
5.0000 mg | ORAL_TABLET | ORAL | Status: DC | PRN
  Administered 2023-06-11 – 2023-06-14 (×8): 5 mg via ORAL
  Filled 2023-06-11 (×8): qty 1

## 2023-06-11 MED ORDER — ONDANSETRON HCL 4 MG/2ML IJ SOLN
4.0000 mg | Freq: Four times a day (QID) | INTRAMUSCULAR | Status: DC | PRN
Start: 2023-06-11 — End: 2023-06-17

## 2023-06-11 MED ORDER — ONDANSETRON HCL 4 MG PO TABS
4.0000 mg | ORAL_TABLET | Freq: Four times a day (QID) | ORAL | Status: DC | PRN
  Administered 2023-06-11: 4 mg via ORAL
  Filled 2023-06-11: qty 1

## 2023-06-11 MED ORDER — MELATONIN 3 MG PO TABS
3.0000 mg | ORAL_TABLET | Freq: Every evening | ORAL | Status: DC | PRN
  Administered 2023-06-13 – 2023-06-16 (×4): 3 mg via ORAL
  Filled 2023-06-11 (×4): qty 1

## 2023-06-11 MED ORDER — SODIUM CHLORIDE 0.9 % IV BOLUS
500.0000 mL | Freq: Once | INTRAVENOUS | Status: AC
  Administered 2023-06-11: 500 mL via INTRAVENOUS

## 2023-06-11 MED ORDER — HYDROMORPHONE HCL 1 MG/ML IJ SOLN
0.5000 mg | Freq: Once | INTRAMUSCULAR | Status: AC
  Administered 2023-06-11: 0.5 mg via INTRAVENOUS
  Filled 2023-06-11: qty 0.5

## 2023-06-11 MED ORDER — HYDROMORPHONE HCL 1 MG/ML IJ SOLN
0.5000 mg | INTRAMUSCULAR | Status: DC | PRN
  Administered 2023-06-11 – 2023-06-12 (×4): 0.5 mg via INTRAVENOUS
  Filled 2023-06-11 (×4): qty 0.5

## 2023-06-11 MED ORDER — ACETAMINOPHEN 325 MG PO TABS
650.0000 mg | ORAL_TABLET | Freq: Four times a day (QID) | ORAL | Status: DC | PRN
  Administered 2023-06-14 – 2023-06-17 (×4): 650 mg via ORAL
  Filled 2023-06-11 (×4): qty 2

## 2023-06-11 MED ORDER — EZETIMIBE 10 MG PO TABS
10.0000 mg | ORAL_TABLET | Freq: Every day | ORAL | Status: DC
  Administered 2023-06-11 – 2023-06-17 (×7): 10 mg via ORAL
  Filled 2023-06-11 (×7): qty 1

## 2023-06-11 MED ORDER — MAGNESIUM OXIDE -MG SUPPLEMENT 400 (240 MG) MG PO TABS
400.0000 mg | ORAL_TABLET | Freq: Every day | ORAL | Status: DC
  Administered 2023-06-11 – 2023-06-17 (×7): 400 mg via ORAL
  Filled 2023-06-11 (×6): qty 1

## 2023-06-11 NOTE — ED Triage Notes (Signed)
Patient BIB [facility] for a fall, Patient fell back on bottom down 2 steps. Patient takes warfrin. Received 4mg  of zofran, 10mg  of morphine 15 mins ago per EMS and 15mg  of toradol. Patient denise's hitting her head.

## 2023-06-11 NOTE — ED Notes (Signed)
Patient placed on 2L nasal cannula. For Oxygen level of 87% on room air. Oxygen level come up to 95%. MD made aware.

## 2023-06-11 NOTE — Assessment & Plan Note (Signed)
Continue rate control with amiodarone and metoprolol INR today 2,8 Hold warfarin in preparation for surgical intervention tomorrow.  After surgery resume warfarin per orthopedics recommendation.

## 2023-06-11 NOTE — ED Provider Notes (Signed)
[place] EMERGENCY DEPARTMENT AT [facility] Provider Note   CSN: [id number] Arrival date & time: 06/11/23  1324     History {Add pertinent medical, surgical, social history, OB history to HPI:1} Chief Complaint  Patient presents with   [name] is a 73 y.o. female.  Patient with a history of atrial fibs and valve replacement.  She slipped on the steps today and complains of pain in her right hip   Fall       Home Medications Prior to Admission medications   Medication Sig Start Date End Date Taking? Authorizing Provider  amiodarone (PACERONE) 200 MG tablet Take 1 tablet (200 mg total) by mouth daily. Amiodarone 200 mg BID x 3 weeks followed by amiodarone 200 mg once daily. 03/23/23 03/17/24  Marinus Maw, MD  ezetimibe (ZETIA) 10 MG tablet Take 10 mg by mouth daily. 12/03/20   [provider]  losartan (COZAAR) 25 MG tablet Take 1 tablet by mouth daily. 11/23/22 11/23/23  [provider]  magnesium oxide (MAG-OX) 400 (240 Mg) MG tablet Take 1 tablet by mouth daily. 10/31/22   [provider]  melatonin 1 MG TABS tablet Take 4 mg by mouth at bedtime as needed (sleep). gummies    [provider]  metFORMIN (GLUCOPHAGE) 1000 MG tablet Take 1,000 mg by mouth 2 (two) times daily.    [provider]  metoprolol succinate (TOPROL-XL) 25 MG 24 hr tablet Take 1 tablet (25 mg total) by mouth daily. 02/03/23 05/04/23  ShahmehdiGemma Payor, MD  nitrofurantoin (MACRODANTIN) 50 MG capsule Take 50 mg by mouth daily. 02/07/23   [provider]  omeprazole (PRILOSEC) 20 MG capsule Take 20 mg by mouth daily.  04/18/14   [provider]  Potassium Chloride ER 20 MEQ TBCR Take 1 tablet (20 mEq total) by mouth daily. 02/04/23   Marinus Maw, MD  warfarin (COUMADIN) 3 MG tablet Take 1/2 a tablet to 1 tablet by mouth daily as directed by the coumadin clinic. 12/23/22   Wendall Stade, MD      Allergies     Nystatin, Other, and Statins    Review of Systems   Review of Systems  Physical Exam Updated Vital Signs BP (!) 164/89   Pulse 61   Temp 98.1 F (36.7 C) (Oral)   Resp 18   Ht 5\' 8"  (1.727 m)   Wt 106.6 kg   LMP 04/26/1998 (Within Years)   SpO2 93%   BMI 35.73 kg/m  Physical Exam  ED Results / Procedures / Treatments   Labs (all labs ordered are listed, but only abnormal results are displayed) Labs Reviewed  CBC WITH DIFFERENTIAL/PLATELET - Abnormal; Notable for the following components:      Result Value   WBC 11.8 (*)    RBC 3.81 (*)    Hemoglobin 11.3 (*)    HCT 35.9 (*)    Neutro Abs 9.9 (*)    Abs Immature Granulocytes 0.10 (*)    All other components within normal limits  PROTIME-INR - Abnormal; Notable for the following components:   Prothrombin Time 30.0 (*)    INR 2.8 (*)    All other components within normal limits  COMPREHENSIVE METABOLIC PANEL - Abnormal; Notable for the following components:   Glucose, Bld 165 (*)    BUN 27 (*)    Creatinine, Ser 1.32 (*)    Calcium 8.7 (*)    GFR, Estimated 42 (*)  All other components within normal limits  I-STAT CHEM 8, ED - Abnormal; Notable for the following components:   BUN 26 (*)    Creatinine, Ser 1.50 (*)    Glucose, Bld 159 (*)    Calcium, Ion 1.09 (*)    All other components within normal limits  TYPE AND SCREEN    EKG None  Radiology DG Chest Port 1 View Result Date: 06/11/2023 CLINICAL DATA:  sob, fall EXAM: PORTABLE CHEST 1 VIEW COMPARISON:  January 31, 2023 FINDINGS: The cardiomediastinal silhouette is unchanged and enlarged in contour.Status post median sternotomy and multiple valve replacement. LEFT chest cardiac pacing device. Atherosclerotic calcifications. No pleural effusion. No pneumothorax. No acute pleuroparenchymal abnormality. IMPRESSION: No acute cardiopulmonary abnormality. Electronically Signed   By: Meda Klinefelter M.D.   On: 06/11/2023 14:00   DG Pelvis Portable Result  Date: 06/11/2023 CLINICAL DATA:  Fall EXAM: PORTABLE PELVIS 1-2 VIEWS COMPARISON:  None Available. FINDINGS: Osteopenia. Comminuted inter trochanteric fracture of the RIGHT proximal femur with mild to moderate displacement of the greater and lesser trochanter. RIGHT femoral head appears grossly aligned within the acetabulum. The sacrum is obscured by overlapping bowel contents. Lucency along the LEFT proximal femoral head is favored to be summation artifact from overlapping osseous structures. Atherosclerotic calcifications. IMPRESSION: Comminuted intertrochanteric fracture of the RIGHT proximal femur. Electronically Signed   By: Meda Klinefelter M.D.   On: 06/11/2023 13:46    Procedures Procedures  {Document cardiac monitor, telemetry assessment procedure when appropriate:1}  Medications Ordered in ED Medications  HYDROmorphone (DILAUDID) injection 0.5 mg (has no administration in time range)  sodium chloride 0.9 % bolus 500 mL (500 mLs Intravenous Bolus 06/11/23 1409)    ED Course/ Medical Decision Making/ A&P   {I spoke with Dr. Jena Gauss, orthopedic surgeon and he would like the patient admitted by the hospitalist over at [facility] and the patient needs to be n.p.o. after midnight Click here for ABCD2, HEART and other calculatorsREFRESH Note before signing :1}                              Medical Decision Making Amount and/or Complexity of Data Reviewed Labs: ordered. Radiology: ordered. ECG/medicine tests: ordered.  Risk Prescription drug management. Decision regarding hospitalization.   Right hip fracture, closed.  Patient is admitted to medicine to [facility] with orthopedic fixing her hip tomorrow  {Document critical care time when appropriate:1} {Document review of labs and clinical decision tools ie heart score, Chads2Vasc2 etc:1}  {Document your independent review of radiology images, and any outside records:1} {Document your discussion with family  members, caretakers, and with consultants:1} {Document social determinants of health affecting pt's care:1} {Document your decision making why or why not admission, treatments were needed:1} Final Clinical Impression(s) / ED Diagnoses Final diagnoses:  Closed fracture of right hip, initial encounter [facility])    Rx / DC Orders ED Discharge Orders     None

## 2023-06-11 NOTE — Assessment & Plan Note (Signed)
 Continue blood pressure monitoring, will transition from lisinopril to losartan. Check echocardiogram and continue diuresis.

## 2023-06-11 NOTE — Assessment & Plan Note (Addendum)
Add insulin sliding scale for glucose cover and monitoring.  Continue statin Hold on metformin for now.   Continue with ezetimibe.

## 2023-06-11 NOTE — ED Notes (Signed)
[facility] here to get patient for transport.

## 2023-06-11 NOTE — Assessment & Plan Note (Addendum)
Plan to continue pain control with IV hydromorphone for severe pain and oxycodone for moderate pain.  Hold warfarin in preparation for surgical procedure, getting IV vitamin K 1 mg per orthopedics today.  NPO past midnight per orthopedics recommendations.  GI prophylaxis with pantoprazole.

## 2023-06-11 NOTE — ED Notes (Signed)
Patient received dinner tray and is eating at this time.

## 2023-06-11 NOTE — Progress Notes (Signed)
Ortho Trauma Note  Patient with right intertrochanteric femur fracture and is on coumadin. Will require cephalomedullary nailing of right hip, tentatively will post for surgery tomorrow at [facility]. INR is 2.8 will order 1gm IV vitamin K. NPO after midnight.  Roby Lofts, MD [facility] [phone] (office) [email or web address]

## 2023-06-11 NOTE — ED Notes (Signed)
 Patient transported to X-ray

## 2023-06-11 NOTE — Assessment & Plan Note (Addendum)
Baseline serum cr 1,5   Plan to continue close monitoring of renal function and electrolytes.  Continue blood pressure control. Due to reduced GFR will use hydromorphone for pain control.  Avoid hypotension and nephrotoxic medications.

## 2023-06-11 NOTE — Assessment & Plan Note (Signed)
Calculated BMI 35,7

## 2023-06-11 NOTE — ED Notes (Signed)
ED TO INPATIENT HANDOFF REPORT  ED Nurse Name and Phone #: Haig Prophet. RN   S Name/Age/Gender [name] 73 y.o. female Room/Bed: APA02/APA02  Code Status   Code Status: Full Code  Home/SNF/Other Home Patient oriented to: self, place, time, and situation Is this baseline? Yes   Triage Complete: Triage complete  Chief Complaint Hip fracture (HCC) [S72.009A] Closed right hip fracture (HCC) [S72.001A]  Triage Note Patient BIB [facility] for a fall, Patient fell back on bottom down 2 steps. Patient takes warfrin. Received 4mg  of zofran, 10mg  of morphine 15 mins ago per EMS and 15mg  of toradol. Patient denise's hitting her head.    Allergies Allergies  Allergen Reactions   Nystatin Other (See Comments)    unknown   Other Other (See Comments)    Product Containing 3-Hydroxy-3-Methylglutaryl-Coenzyme A Reductase Inhibitor (Product) unknown   Statins Other (See Comments)    Muscles aches,hurt    Level of Care/Admitting Diagnosis ED Disposition     ED Disposition  Admit   Condition  --   Comment  Hospital Area: [facility] [[id number]]  Level of Care: Telemetry Medical [104]  May admit patient to [facility] or [facility] if equivalent level of care is available:: No  Covid Evaluation: Asymptomatic - no recent exposure (last 10 days) testing not required  Diagnosis: Closed right hip fracture [facility]) [[id number]]  Admitting Physician: Coralie Keens [[id number]]  Attending Physician: Coralie Keens [[id number]]  Certification:: I certify this patient will need inpatient services for at least 2 midnights  Expected Medical Readiness: 06/14/2023          B Medical/Surgery History Past Medical History:  Diagnosis Date   Aortic stenosis    21 mm Inspiris Edwards AVR June 2024 - [facility]   Arthritis    Cervical incompetence    GERD (gastroesophageal reflux disease)    Hay fever    Heart block    [employer] DCPPM on 10/26/2022 with Dr. Zachery Conch -  [facility]   Hemochromatosis    HOCM (hypertrophic obstructive cardiomyopathy) (HCC)    Septal myomectomy June 2024 - [facility]   Hypertension    Migraines    Mitral stenosis    25 mm [employer] mechanical MVR June 2024 - [facility]   Paroxysmal atrial fibrillation (HCC)    Sciatica    Type 2 diabetes mellitus (HCC)    Past Surgical History:  Procedure Laterality Date   CARDIAC SURGERY     CARDIOVERSION N/A 02/02/2023   Procedure: CARDIOVERSION;  Surgeon: Marjo Bicker, MD;  Location: [facility];  Service: Cardiovascular;  Laterality: N/A;   CARPAL TUNNEL RELEASE Right    COLONOSCOPY N/A 08/01/2014   Procedure: COLONOSCOPY;  Surgeon: Malissa Hippo, MD;  Location: [facility];  Service: Endoscopy;  Laterality: N/A;  900 -- moved to 10:00 - Ann notified pt   ELBOW SURGERY     PACEMAKER INSERTION     June 2024   RIGHT/LEFT HEART CATH AND CORONARY ANGIOGRAPHY N/A 07/28/2020   Procedure: RIGHT/LEFT HEART CATH AND CORONARY ANGIOGRAPHY;  Surgeon: Tonny Bollman, MD;  Location: [facility] INVASIVE CV LAB;  Service: Cardiovascular;  Laterality: N/A;   ROTATOR CUFF REPAIR Right 2012   TEE WITHOUT CARDIOVERSION N/A 02/02/2023   Procedure: TRANSESOPHAGEAL ECHOCARDIOGRAM (TEE);  Surgeon: Marjo Bicker, MD;  Location: [facility];  Service: Cardiovascular;  Laterality: N/A;   TONSILLECTOMY     TOTAL KNEE ARTHROPLASTY Right 10/13/2021   Procedure: TOTAL KNEE ARTHROPLASTY;  Surgeon: Durene Romans,  MD;  Location: [facility];  Service: Orthopedics;  Laterality: Right;   TUBAL LIGATION     WISDOM TOOTH EXTRACTION       A IV Location/Drains/Wounds Patient Lines/Drains/Airways Status     Active Line/Drains/Airways     Name Placement date Placement time Site Days   Peripheral IV 06/11/23 20 G Left Antecubital 06/11/23  1341  Antecubital  less than 1            Intake/Output Last 24 hours No intake or output data in the 24 hours ending 06/11/23 1451  Labs/Imaging Results for orders placed or performed during  the hospital encounter of 06/11/23 (from the past 48 hours)  CBC with Differential     Status: Abnormal   Collection Time: 06/11/23  2:10 PM  Result Value Ref Range   WBC 11.8 (H) 4.0 - 10.5 K/uL   RBC 3.81 (L) 3.87 - 5.11 MIL/uL   Hemoglobin 11.3 (L) 12.0 - 15.0 g/dL   HCT 95.2 (L) 84.1 - 32.4 %   MCV 94.2 80.0 - 100.0 fL   MCH 29.7 26.0 - 34.0 pg   MCHC 31.5 30.0 - 36.0 g/dL   RDW 40.1 02.7 - 25.3 %   Platelets 225 150 - 400 K/uL   nRBC 0.0 0.0 - 0.2 %   Neutrophils Relative % 83 %   Neutro Abs 9.9 (H) 1.7 - 7.7 K/uL   Lymphocytes Relative 9 %   Lymphs Abs 1.0 0.7 - 4.0 K/uL   Monocytes Relative 5 %   Monocytes Absolute 0.6 0.1 - 1.0 K/uL   Eosinophils Relative 1 %   Eosinophils Absolute 0.2 0.0 - 0.5 K/uL   Basophils Relative 1 %   Basophils Absolute 0.1 0.0 - 0.1 K/uL   Immature Granulocytes 1 %   Abs Immature Granulocytes 0.10 (H) 0.00 - 0.07 K/uL    Comment: Performed at [facility], [address], [place], [place] [address]  Protime-INR     Status: Abnormal   Collection Time: 06/11/23  2:10 PM  Result Value Ref Range   Prothrombin Time 30.0 (H) 11.4 - 15.2 seconds   INR 2.8 (H) 0.8 - 1.2    Comment: (NOTE) INR goal varies based on device and disease states. Performed at [facility], [address] [place], [place] [address]   Comprehensive metabolic panel     Status: Abnormal   Collection Time: 06/11/23  2:10 PM  Result Value Ref Range   Sodium 136 135 - 145 mmol/L   Potassium 4.6 3.5 - 5.1 mmol/L   Chloride 103 98 - 111 mmol/L   CO2 22 22 - 32 mmol/L   Glucose, Bld 165 (H) 70 - 99 mg/dL    Comment: Glucose reference range applies only to samples taken after fasting for at least 8 hours.   BUN 27 (H) 8 - 23 mg/dL   Creatinine, Ser 5.95 (H) 0.44 - 1.00 mg/dL   Calcium 8.7 (L) 8.9 - 10.3 mg/dL   Total Protein 6.5 6.5 - 8.1 g/dL   Albumin 3.9 3.5 - 5.0 g/dL   AST 26 15 - 41 U/L   ALT 27 0 - 44 U/L   Alkaline Phosphatase 80 38 - 126 U/L   Total Bilirubin 0.8  0.0 - 1.2 mg/dL   GFR, Estimated 42 (L) >60 mL/min    Comment: (NOTE) Calculated using the CKD-EPI Creatinine Equation (2021)    Anion gap 11 5 - 15    Comment: Performed at [facility], [address] [place], [place] [address]  I-stat chem 8, ED (not at [facility], [facility] or [facility])     Status: Abnormal   Collection Time: 06/11/23  2:11 PM  Result Value Ref Range   Sodium 137 135 - 145 mmol/L   Potassium 4.7 3.5 - 5.1 mmol/L   Chloride 104 98 - 111 mmol/L   BUN 26 (H) 8 - 23 mg/dL   Creatinine, Ser 1.91 (H) 0.44 - 1.00 mg/dL   Glucose, Bld 478 (H) 70 - 99 mg/dL    Comment: Glucose reference range applies only to samples taken after fasting for at least 8 hours.   Calcium, Ion 1.09 (L) 1.15 - 1.40 mmol/L   TCO2 23 22 - 32 mmol/L   Hemoglobin 12.2 12.0 - 15.0 g/dL   HCT 29.5 62.1 - 30.8 %   DG Chest Port 1 View Result Date: 06/11/2023 CLINICAL DATA:  sob, fall EXAM: PORTABLE CHEST 1 VIEW COMPARISON:  January 31, 2023 FINDINGS: The cardiomediastinal silhouette is unchanged and enlarged in contour.Status post median sternotomy and multiple valve replacement. LEFT chest cardiac pacing device. Atherosclerotic calcifications. No pleural effusion. No pneumothorax. No acute pleuroparenchymal abnormality. IMPRESSION: No acute cardiopulmonary abnormality. Electronically Signed   By: Meda Klinefelter M.D.   On: 06/11/2023 14:00   DG Pelvis Portable Result Date: 06/11/2023 CLINICAL DATA:  Fall EXAM: PORTABLE PELVIS 1-2 VIEWS COMPARISON:  None Available. FINDINGS: Osteopenia. Comminuted inter trochanteric fracture of the RIGHT proximal femur with mild to moderate displacement of the greater and lesser trochanter. RIGHT femoral head appears grossly aligned within the acetabulum. The sacrum is obscured by overlapping bowel contents. Lucency along the LEFT proximal femoral head is favored to be summation artifact from overlapping osseous structures. Atherosclerotic calcifications. IMPRESSION: Comminuted  intertrochanteric fracture of the RIGHT proximal femur. Electronically Signed   By: Meda Klinefelter M.D.   On: 06/11/2023 13:46    Pending Labs Unresulted Labs (From admission, onward)     Start     Ordered   06/11/23 1347  Type and screen  Once,   STAT        06/11/23 1347   Signed and Held  Basic metabolic panel  Tomorrow morning,   R        Signed and Held   Signed and Held  CBC  Tomorrow morning,   R        Signed and Held   Signed and Held  Protime-INR  Tomorrow morning,   R        Signed and Held            Vitals/Pain Today's Vitals   06/11/23 1333 06/11/23 1345 06/11/23 1400 06/11/23 1417  BP:  (!) 158/71 (!) 164/89   Pulse:  60 61   Resp:  19 18   Temp:      TempSrc:      SpO2:  93% 93%   Weight: 106.6 kg     Height: 5\' 8"  (1.727 m)     PainSc:    7     Isolation Precautions No active isolations  Medications Medications  phytonadione (VITAMIN K) 1 mg in dextrose 5 % 50 mL IVPB (has no administration in time range)  sodium chloride 0.9 % bolus 500 mL (500 mLs Intravenous Bolus 06/11/23 1409)  HYDROmorphone (DILAUDID) injection 0.5 mg (0.5 mg Intravenous Given 06/11/23 1444)    Mobility walks     Focused Assessments Cardiac Assessment Handoff:    No results found for: "CKTOTAL", "CKMB", "CKMBINDEX", "TROPONINI"  No results found for: "DDIMER" Does the Patient currently have chest pain? No    R Recommendations: See Admitting Provider Note  Report given to:   Additional Notes:

## 2023-06-11 NOTE — H&P (Addendum)
History and Physical    Patient: [name] WUJ:[id number] DOB: March 30, 1951 DOA: 06/11/2023 DOS: the patient was seen and examined on 06/11/2023 PCP: Assunta Found, MD  Patient coming from: Home  Chief Complaint:  Chief Complaint  Patient presents with   Fall   HPI: [name] is a 73 y.o. female with medical history significant of heart block sp pacemaker, atrial fibrillation, aortic stenosis sp AVR, hypertrophic cardiomyopathy, hypertension, T2DM and obesity who presented after a mechanical fall.   Patient has been at her usual state of health until today, when she had a mechanical fall while climbing few steps into her house. She was carrying grocery bags with her hands bilaterally, she loss her balance and fell on her right side. She landed seated with no head trauma, no loss of consciousness.  Post fall she had severe pain on her right hip, she was note able to stand anymore.  The pain was sharp in nature with no radiation, worse with movement and with no improving factors.   She has been taking warfarin for atrial fibrillation. She uses a walker for ambulation.    Review of Systems: As mentioned in the history of present illness. All other systems reviewed and are negative. Past Medical History:  Diagnosis Date   Aortic stenosis    21 mm Inspiris Edwards AVR June 2024 - [facility]   Arthritis    Cervical incompetence    GERD (gastroesophageal reflux disease)    Hay fever    Heart block    [employer] DCPPM on 10/26/2022 with Dr. Zachery Conch - [facility]   Hemochromatosis    HOCM (hypertrophic obstructive cardiomyopathy) [facility])    Septal myomectomy June 2024 - [facility]   Hypertension    Migraines    Mitral stenosis    25 mm Regent mechanical MVR June 2024 - [facility]   Paroxysmal atrial fibrillation (HCC)    Sciatica    Type 2 diabetes mellitus (HCC)    Past Surgical History:  Procedure Laterality Date   CARDIAC SURGERY     CARDIOVERSION N/A 02/02/2023   Procedure: CARDIOVERSION;   Surgeon: Marjo Bicker, MD;  Location: [facility];  Service: Cardiovascular;  Laterality: N/A;   CARPAL TUNNEL RELEASE Right    COLONOSCOPY N/A 08/01/2014   Procedure: COLONOSCOPY;  Surgeon: Malissa Hippo, MD;  [facility];  Service: Endoscopy;  Laterality: N/A;  900 -- moved to 10:00 - Ann notified pt   ELBOW SURGERY     PACEMAKER INSERTION     June 2024   RIGHT/LEFT HEART CATH AND CORONARY ANGIOGRAPHY N/A 07/28/2020   Procedure: RIGHT/LEFT HEART CATH AND CORONARY ANGIOGRAPHY;  Surgeon: Tonny Bollman, MD;  Location: [facility] INVASIVE CV LAB;  Service: Cardiovascular;  Laterality: N/A;   ROTATOR CUFF REPAIR Right 2012   TEE WITHOUT CARDIOVERSION N/A 02/02/2023   Procedure: TRANSESOPHAGEAL ECHOCARDIOGRAM (TEE);  Surgeon: Marjo Bicker, MD;  Location: [facility];  Service: Cardiovascular;  Laterality: N/A;   TONSILLECTOMY     TOTAL KNEE ARTHROPLASTY Right 10/13/2021   Procedure: TOTAL KNEE ARTHROPLASTY;  Surgeon: Durene Romans, MD;  Location: [facility];  Service: Orthopedics;  Laterality: Right;   TUBAL LIGATION     WISDOM TOOTH EXTRACTION     Social History:  reports that she quit smoking about 53 years ago. Her smoking use included cigarettes. She started smoking about 60 years ago. She has a 0.7 pack-year smoking history. She has never used smokeless tobacco. She reports that she does not currently  use alcohol. She reports that she does not use drugs.  Allergies  Allergen Reactions   Nystatin Other (See Comments)    unknown   Other Other (See Comments)    Product Containing 3-Hydroxy-3-Methylglutaryl-Coenzyme A Reductase Inhibitor (Product) unknown   Statins Other (See Comments)    Muscles aches,hurt    Family History  Problem Relation Age of Onset   Heart attack Paternal Grandfather    Migraines Maternal Grandmother    Heart attack Maternal Grandfather    Heart attack Father    Heart murmur Father    Cancer Mother        pancreatic cancer   Heart murmur Brother     Breast cancer Maternal Aunt     Prior to Admission medications   Medication Sig Start Date End Date Taking? Authorizing Provider  amiodarone (PACERONE) 200 MG tablet Take 1 tablet (200 mg total) by mouth daily. Amiodarone 200 mg BID x 3 weeks followed by amiodarone 200 mg once daily. 03/23/23 03/17/24  Marinus Maw, MD  ezetimibe (ZETIA) 10 MG tablet Take 10 mg by mouth daily. 12/03/20   [provider]  losartan (COZAAR) 25 MG tablet Take 1 tablet by mouth daily. 11/23/22 11/23/23  [provider]  magnesium oxide (MAG-OX) 400 (240 Mg) MG tablet Take 1 tablet by mouth daily. 10/31/22   [provider]  melatonin 1 MG TABS tablet Take 4 mg by mouth at bedtime as needed (sleep). gummies    [provider]  metFORMIN (GLUCOPHAGE) 1000 MG tablet Take 1,000 mg by mouth 2 (two) times daily.    [provider]  metoprolol succinate (TOPROL-XL) 25 MG 24 hr tablet Take 1 tablet (25 mg total) by mouth daily. 02/03/23 05/04/23  ShahmehdiGemma Payor, MD  nitrofurantoin (MACRODANTIN) 50 MG capsule Take 50 mg by mouth daily. 02/07/23   [provider]  omeprazole (PRILOSEC) 20 MG capsule Take 20 mg by mouth daily.  04/18/14   [provider]  Potassium Chloride ER 20 MEQ TBCR Take 1 tablet (20 mEq total) by mouth daily. 02/04/23   Marinus Maw, MD  warfarin (COUMADIN) 3 MG tablet Take 1/2 a tablet to 1 tablet by mouth daily as directed by the coumadin clinic. 12/23/22   Wendall Stade, MD    Physical Exam: Vitals:   06/11/23 1332 06/11/23 1333 06/11/23 1345 06/11/23 1400  BP: (!) 181/88  (!) 158/71 (!) 164/89  Pulse: 60  60 61  Resp:   19 18  Temp: 98.1 F (36.7 C)     TempSrc: Oral     SpO2: 92%  93% 93%  Weight:  106.6 kg    Height:  5\' 8"  (1.727 m)     Neurology awake and alert ENT with no pallor or icterus Cardiovascular with S1 and S2 present and regular with no gallops, rubs or murmurs Respiratory with no rales or wheezing, no  rhonchi Abdomen with no distention  Trace left lower extremity edema, pitting.  Right lower extremity externally rotated and shortened.  Data Reviewed:   Na 136, K 4,6 Cl 103 bicarbonate 22, glucose 165, bun 27 cr 1,32  Wbc 11,8 hgb 11.3 plt 225  INR 2,8   Chest radiograph with mild cardiomegaly, no infiltrates or effusions, pacemaker in place with one atrial and one ventricular leads. Positive sternotomy wires in place.   Hip radiograph with comminuted intertrochanteric fracture of the right proximal femur.   EKG 60 bpm, left axis deviation, qtc 518, prolonged qrs,  atrial and ventricular paced rhythm with no significant ST segment or T wave changes.    Assessment and Plan: * Closed right femoral fracture (HCC) Plan to continue pain control with IV hydromorphone for severe pain and oxycodone for moderate pain.  Hold warfarin in preparation for surgical procedure, getting IV vitamin K 1 mg per orthopedics today.  NPO past midnight per orthopedics recommendations.  GI prophylaxis with pantoprazole.   Essential hypertension Continue blood pressure control with metoprolol and losartan.    Permanent atrial fibrillation (HCC) Continue rate control with amiodarone and metoprolol INR today 2,8 Hold warfarin in preparation for surgical intervention tomorrow.  After surgery resume warfarin per orthopedics recommendation.   CKD stage 3b, GFR 30-44 ml/min (HCC) Baseline serum cr 1,5   Plan to continue close monitoring of renal function and electrolytes.  Continue blood pressure control. Due to reduced GFR will use hydromorphone for pain control.  Avoid hypotension and nephrotoxic medications.   Type 2 diabetes mellitus with hyperlipidemia (HCC) Add insulin sliding scale for glucose cover and monitoring.  Continue statin Hold on metformin for now.   Continue with ezetimibe.   Obesity, class 2 Calculated BMI 35,7     Advance Care Planning:   Code Status: Full Code  I spoke  with Mrs. Nonaka about advance directives, what to do in case of cardiac or respiratory arrest. She decided to be full code and let the medical team attempt resuscitation. If that occurs, depending on the outcomes her family will make further decisions. For now she wants to be FULL CODE.   Consults: Orthopedics per ED   Family Communication: no family at the bedside   Severity of Illness: The appropriate patient status for this patient is INPATIENT. Inpatient status is judged to be reasonable and necessary in order to provide the required intensity of service to ensure the patient's safety. The patient's presenting symptoms, physical exam findings, and initial radiographic and laboratory data in the context of their chronic comorbidities is felt to place them at high risk for further clinical deterioration. Furthermore, it is not anticipated that the patient will be medically stable for discharge from the hospital within 2 midnights of admission.   * I certify that at the point of admission it is my clinical judgment that the patient will require inpatient hospital care spanning beyond 2 midnights from the point of admission due to high intensity of service, high risk for further deterioration and high frequency of surveillance required.*  Author: Coralie Keens, MD 06/11/2023 2:34 PM  For on call review [email or web address].

## 2023-06-12 ENCOUNTER — Encounter (HOSPITAL_COMMUNITY): Payer: Self-pay | Admitting: Internal Medicine

## 2023-06-12 ENCOUNTER — Inpatient Hospital Stay (HOSPITAL_COMMUNITY): Payer: Medicare Other

## 2023-06-12 ENCOUNTER — Other Ambulatory Visit: Payer: Self-pay

## 2023-06-12 ENCOUNTER — Inpatient Hospital Stay (HOSPITAL_COMMUNITY): Payer: Medicare Other | Admitting: Anesthesiology

## 2023-06-12 ENCOUNTER — Encounter (HOSPITAL_COMMUNITY)
Admission: EM | Disposition: A | Discharge status: Skilled Nursing Facility | Payer: Self-pay | Source: Home / Self Care | Attending: Internal Medicine

## 2023-06-12 DIAGNOSIS — S72141A Displaced intertrochanteric fracture of right femur, initial encounter for closed fracture: Secondary | ICD-10-CM | POA: Diagnosis not present

## 2023-06-12 DIAGNOSIS — I4819 Other persistent atrial fibrillation: Secondary | ICD-10-CM | POA: Diagnosis not present

## 2023-06-12 DIAGNOSIS — I10 Essential (primary) hypertension: Secondary | ICD-10-CM | POA: Diagnosis not present

## 2023-06-12 DIAGNOSIS — E119 Type 2 diabetes mellitus without complications: Secondary | ICD-10-CM

## 2023-06-12 DIAGNOSIS — S72101A Unspecified trochanteric fracture of right femur, initial encounter for closed fracture: Secondary | ICD-10-CM

## 2023-06-12 HISTORY — PX: INTRAMEDULLARY (IM) NAIL INTERTROCHANTERIC: SHX5875

## 2023-06-12 LAB — CBC
HCT: 32.5 % — ABNORMAL LOW (ref 36.0–46.0)
Hemoglobin: 10.7 g/dL — ABNORMAL LOW (ref 12.0–15.0)
MCH: 30.1 pg (ref 26.0–34.0)
MCHC: 32.9 g/dL (ref 30.0–36.0)
MCV: 91.5 fL (ref 80.0–100.0)
Platelets: 270 10*3/uL (ref 150–400)
RBC: 3.55 MIL/uL — ABNORMAL LOW (ref 3.87–5.11)
RDW: 13.3 % (ref 11.5–15.5)
WBC: 17.4 10*3/uL — ABNORMAL HIGH (ref 4.0–10.5)
nRBC: 0 % (ref 0.0–0.2)

## 2023-06-12 LAB — BASIC METABOLIC PANEL
Anion gap: 10 (ref 5–15)
BUN: 26 mg/dL — ABNORMAL HIGH (ref 8–23)
CO2: 23 mmol/L (ref 22–32)
Calcium: 8.8 mg/dL — ABNORMAL LOW (ref 8.9–10.3)
Chloride: 101 mmol/L (ref 98–111)
Creatinine, Ser: 1.35 mg/dL — ABNORMAL HIGH (ref 0.44–1.00)
GFR, Estimated: 41 mL/min — ABNORMAL LOW (ref >60)
Glucose, Bld: 153 mg/dL — ABNORMAL HIGH (ref 70–99)
Potassium: 4.7 mmol/L (ref 3.5–5.1)
Sodium: 134 mmol/L — ABNORMAL LOW (ref 135–145)

## 2023-06-12 LAB — GLUCOSE, CAPILLARY
Glucose-Capillary: 138 mg/dL — ABNORMAL HIGH (ref 70–99)
Glucose-Capillary: 162 mg/dL — ABNORMAL HIGH (ref 70–99)
Glucose-Capillary: 122 mg/dL — ABNORMAL HIGH (ref 70–99)
Glucose-Capillary: 140 mg/dL — ABNORMAL HIGH (ref 70–99)
Glucose-Capillary: 333 mg/dL — ABNORMAL HIGH (ref 70–99)
Glucose-Capillary: 231 mg/dL — ABNORMAL HIGH (ref 70–99)

## 2023-06-12 LAB — PROTIME-INR
INR: 2 — ABNORMAL HIGH (ref 0.8–1.2)
Prothrombin Time: 23 s — ABNORMAL HIGH (ref 11.4–15.2)

## 2023-06-12 LAB — VITAMIN D 25 HYDROXY (VIT D DEFICIENCY, FRACTURES): Vit D, 25-Hydroxy: 7.46 ng/mL — ABNORMAL LOW (ref 30–100)

## 2023-06-12 SURGERY — FIXATION, FRACTURE, INTERTROCHANTERIC, WITH INTRAMEDULLARY ROD
Anesthesia: General | Laterality: Right

## 2023-06-12 MED ORDER — CEFAZOLIN SODIUM-DEXTROSE 2-3 GM-%(50ML) IV SOLR
INTRAVENOUS | Status: DC | PRN
  Administered 2023-06-12: 2 g via INTRAVENOUS

## 2023-06-12 MED ORDER — DOCUSATE SODIUM 100 MG PO CAPS
100.0000 mg | ORAL_CAPSULE | Freq: Two times a day (BID) | ORAL | Status: DC
  Administered 2023-06-12 – 2023-06-17 (×10): 100 mg via ORAL
  Filled 2023-06-12 (×10): qty 1

## 2023-06-12 MED ORDER — EPHEDRINE SULFATE-NACL 50-0.9 MG/10ML-% IV SOSY
PREFILLED_SYRINGE | INTRAVENOUS | Status: DC | PRN
  Administered 2023-06-12: 5 mg via INTRAVENOUS
  Administered 2023-06-12: 10 mg via INTRAVENOUS

## 2023-06-12 MED ORDER — MIDAZOLAM HCL 2 MG/2ML IJ SOLN
INTRAMUSCULAR | Status: DC | PRN
  Administered 2023-06-12: 1 mg via INTRAVENOUS

## 2023-06-12 MED ORDER — CHLORHEXIDINE GLUCONATE 0.12 % MT SOLN: 15.0000 mL | Freq: Once | OROMUCOSAL | Status: AC

## 2023-06-12 MED ORDER — LACTATED RINGERS IV SOLN: INTRAVENOUS | Status: DC

## 2023-06-12 MED ORDER — ONDANSETRON HCL 4 MG/2ML IJ SOLN
INTRAMUSCULAR | Status: AC
  Filled 2023-06-12: qty 2

## 2023-06-12 MED ORDER — WARFARIN - PHARMACIST DOSING INPATIENT: Freq: Every day | Status: DC

## 2023-06-12 MED ORDER — ALBUMIN HUMAN 5 % IV SOLN: INTRAVENOUS | Status: DC | PRN

## 2023-06-12 MED ORDER — METOCLOPRAMIDE HCL 5 MG PO TABS: 5.0000 mg | ORAL_TABLET | Freq: Three times a day (TID) | ORAL | Status: DC | PRN

## 2023-06-12 MED ORDER — CEFAZOLIN SODIUM-DEXTROSE 2-4 GM/100ML-% IV SOLN
INTRAVENOUS | Status: AC
  Filled 2023-06-12: qty 100

## 2023-06-12 MED ORDER — LIDOCAINE 2% (20 MG/ML) 5 ML SYRINGE
INTRAMUSCULAR | Status: DC | PRN
  Administered 2023-06-12: 50 mg via INTRAVENOUS

## 2023-06-12 MED ORDER — MIDAZOLAM HCL 2 MG/2ML IJ SOLN
INTRAMUSCULAR | Status: AC
  Filled 2023-06-12: qty 2

## 2023-06-12 MED ORDER — POLYETHYLENE GLYCOL 3350 17 G PO PACK: 17.0000 g | PACK | Freq: Every day | ORAL | Status: DC | PRN

## 2023-06-12 MED ORDER — PHENYLEPHRINE 80 MCG/ML (10ML) SYRINGE FOR IV PUSH (FOR BLOOD PRESSURE SUPPORT)
PREFILLED_SYRINGE | INTRAVENOUS | Status: DC | PRN
Start: 2023-06-12 — End: 2023-06-12
  Administered 2023-06-12 (×2): 160 ug via INTRAVENOUS

## 2023-06-12 MED ORDER — DEXAMETHASONE SODIUM PHOSPHATE 10 MG/ML IJ SOLN
INTRAMUSCULAR | Status: AC
  Filled 2023-06-12: qty 1

## 2023-06-12 MED ORDER — FENTANYL CITRATE (PF) 250 MCG/5ML IJ SOLN
INTRAMUSCULAR | Status: DC | PRN
  Administered 2023-06-12: 50 ug via INTRAVENOUS

## 2023-06-12 MED ORDER — ROCURONIUM BROMIDE 10 MG/ML (PF) SYRINGE
PREFILLED_SYRINGE | INTRAVENOUS | Status: AC
  Filled 2023-06-12: qty 10

## 2023-06-12 MED ORDER — TRANEXAMIC ACID-NACL 1000-0.7 MG/100ML-% IV SOLN
1000.0000 mg | Freq: Once | INTRAVENOUS | Status: AC
  Administered 2023-06-12: 1000 mg via INTRAVENOUS
  Filled 2023-06-12: qty 100

## 2023-06-12 MED ORDER — DEXAMETHASONE SODIUM PHOSPHATE 10 MG/ML IJ SOLN
INTRAMUSCULAR | Status: DC | PRN
  Administered 2023-06-12: 8 mg via INTRAVENOUS

## 2023-06-12 MED ORDER — SUGAMMADEX SODIUM 200 MG/2ML IV SOLN
INTRAVENOUS | Status: DC | PRN
  Administered 2023-06-12: 200 mg via INTRAVENOUS

## 2023-06-12 MED ORDER — FENTANYL CITRATE (PF) 100 MCG/2ML IJ SOLN
25.0000 ug | INTRAMUSCULAR | Status: DC | PRN
Start: 2023-06-12 — End: 2023-06-12

## 2023-06-12 MED ORDER — ORAL CARE MOUTH RINSE: 15.0000 mL | Freq: Once | OROMUCOSAL | Status: AC

## 2023-06-12 MED ORDER — CHLORHEXIDINE GLUCONATE 0.12 % MT SOLN
OROMUCOSAL | Status: AC
  Administered 2023-06-12: 15 mL via OROMUCOSAL
  Filled 2023-06-12: qty 15

## 2023-06-12 MED ORDER — ONDANSETRON HCL 4 MG/2ML IJ SOLN
INTRAMUSCULAR | Status: DC | PRN
Start: 2023-06-12 — End: 2023-06-12
  Administered 2023-06-12: 4 mg via INTRAVENOUS

## 2023-06-12 MED ORDER — METHOCARBAMOL 1000 MG/10ML IJ SOLN: 500.0000 mg | Freq: Three times a day (TID) | INTRAMUSCULAR | Status: DC | PRN

## 2023-06-12 MED ORDER — SUGAMMADEX SODIUM 200 MG/2ML IV SOLN
INTRAVENOUS | Status: AC
  Filled 2023-06-12: qty 2

## 2023-06-12 MED ORDER — PROPOFOL 10 MG/ML IV BOLUS
INTRAVENOUS | Status: AC
  Filled 2023-06-12: qty 20

## 2023-06-12 MED ORDER — LIDOCAINE 2% (20 MG/ML) 5 ML SYRINGE
INTRAMUSCULAR | Status: AC
  Filled 2023-06-12: qty 5

## 2023-06-12 MED ORDER — ROCURONIUM BROMIDE 10 MG/ML (PF) SYRINGE
PREFILLED_SYRINGE | INTRAVENOUS | Status: DC | PRN
  Administered 2023-06-12: 10 mg via INTRAVENOUS
  Administered 2023-06-12: 50 mg via INTRAVENOUS

## 2023-06-12 MED ORDER — PROPOFOL 10 MG/ML IV BOLUS
INTRAVENOUS | Status: DC | PRN
  Administered 2023-06-12: 100 mg via INTRAVENOUS

## 2023-06-12 MED ORDER — CEFAZOLIN SODIUM-DEXTROSE 2-4 GM/100ML-% IV SOLN
2.0000 g | Freq: Three times a day (TID) | INTRAVENOUS | Status: AC
  Administered 2023-06-12 – 2023-06-13 (×3): 2 g via INTRAVENOUS
  Filled 2023-06-12 (×3): qty 100

## 2023-06-12 MED ORDER — PHENYLEPHRINE HCL-NACL 20-0.9 MG/250ML-% IV SOLN
INTRAVENOUS | Status: DC | PRN
  Administered 2023-06-12: 50 ug/min via INTRAVENOUS

## 2023-06-12 MED ORDER — 0.9 % SODIUM CHLORIDE (POUR BTL) OPTIME
TOPICAL | Status: DC | PRN
  Administered 2023-06-12: 100 mL

## 2023-06-12 MED ORDER — METOCLOPRAMIDE HCL 5 MG/ML IJ SOLN: 5.0000 mg | Freq: Three times a day (TID) | INTRAMUSCULAR | Status: DC | PRN

## 2023-06-12 MED ORDER — INSULIN ASPART 100 UNIT/ML IJ SOLN: 0.0000 [IU] | INTRAMUSCULAR | Status: DC | PRN

## 2023-06-12 MED ORDER — ACETAMINOPHEN 10 MG/ML IV SOLN
INTRAVENOUS | Status: DC | PRN
  Administered 2023-06-12: 1000 mg via INTRAVENOUS

## 2023-06-12 MED ORDER — METHOCARBAMOL 500 MG PO TABS
500.0000 mg | ORAL_TABLET | Freq: Three times a day (TID) | ORAL | Status: DC | PRN
  Administered 2023-06-13 – 2023-06-17 (×13): 500 mg via ORAL
  Filled 2023-06-12 (×14): qty 1

## 2023-06-12 MED ORDER — FENTANYL CITRATE (PF) 250 MCG/5ML IJ SOLN
INTRAMUSCULAR | Status: AC
  Filled 2023-06-12: qty 5

## 2023-06-12 MED ORDER — VANCOMYCIN HCL 1000 MG IV SOLR
INTRAVENOUS | Status: AC
  Filled 2023-06-12: qty 20

## 2023-06-12 SURGICAL SUPPLY — 35 items
BIT DRILL INTERTAN LAG SCREW (BIT) IMPLANT
BIT DRILL SHORT 4.0 (BIT) IMPLANT
BRUSH SCRUB EZ PLAIN DRY (MISCELLANEOUS) ×2 IMPLANT
CHLORAPREP W/TINT 26 (MISCELLANEOUS) ×1 IMPLANT
COVER PERINEAL POST (MISCELLANEOUS) ×1 IMPLANT
COVER SURGICAL LIGHT HANDLE (MISCELLANEOUS) ×1 IMPLANT
DERMABOND ADVANCED .7 DNX12 (GAUZE/BANDAGES/DRESSINGS) ×1 IMPLANT
DRAPE C-ARM 35X43 STRL (DRAPES) ×1 IMPLANT
DRAPE IMP U-DRAPE 54X76 (DRAPES) ×2 IMPLANT
DRAPE STERI IOBAN 125X83 (DRAPES) ×1 IMPLANT
DRAPE SURG 17X23 STRL (DRAPES) ×2 IMPLANT
DRAPE U-SHAPE 47X51 STRL (DRAPES) ×1 IMPLANT
DRESSING MEPILEX FLEX 4X4 (GAUZE/BANDAGES/DRESSINGS) IMPLANT
DRSG MEPILEX FLEX 4X4 (GAUZE/BANDAGES/DRESSINGS) ×3 IMPLANT
ELECT REM PT RETURN 9FT ADLT (ELECTROSURGICAL) ×1 IMPLANT
ELECTRODE REM PT RTRN 9FT ADLT (ELECTROSURGICAL) ×1 IMPLANT
GLOVE BIO SURGEON STRL SZ7.5 (GLOVE) ×4 IMPLANT
GLOVE BIOGEL PI IND STRL 7.5 (GLOVE) ×1 IMPLANT
GOWN STRL REUS W/ TWL LRG LVL3 (GOWN DISPOSABLE) ×1 IMPLANT
GUIDE PIN 3.2X343 (PIN) ×2 IMPLANT
GUIDE ROD 3.0 (MISCELLANEOUS) ×1 IMPLANT
KIT BASIN OR (CUSTOM PROCEDURE TRAY) ×1 IMPLANT
KIT TURNOVER KIT B (KITS) ×1 IMPLANT
NAIL LOCK CANN 10X380 130D RT (Nail) IMPLANT
NS IRRIG 1000ML POUR BTL (IV SOLUTION) ×1 IMPLANT
PACK GENERAL/GYN (CUSTOM PROCEDURE TRAY) ×1 IMPLANT
PAD ARMBOARD 7.5X6 YLW CONV (MISCELLANEOUS) ×2 IMPLANT
PIN GUIDE 3.2X343MM (PIN) IMPLANT
ROD GUIDE 3.0 (MISCELLANEOUS) IMPLANT
SCREW LAG COMPR KIT 110/105 (Screw) IMPLANT
SCREW TRIGEN LOW PROF 5.0X40 (Screw) IMPLANT
SUT MNCRL AB 3-0 PS2 18 (SUTURE) ×1 IMPLANT
SUT VIC AB 2-0 CT1 TAPERPNT 27 (SUTURE) ×2 IMPLANT
TOWEL GREEN STERILE (TOWEL DISPOSABLE) ×2 IMPLANT
WATER STERILE IRR 1000ML POUR (IV SOLUTION) ×1 IMPLANT

## 2023-06-12 NOTE — Consult Note (Signed)
Orthopaedic Trauma Service (OTS) Consult   Patient ID: [name] MRN: [id number] DOB/AGE: [birth year] 73 y.o.  Reason for Consult:Right intertrochanteric femur fracture Referring Physician: Dr. Bethann Berkshire, MD [facility]  HPI: [name] is an 73 y.o. female who is being seen in consultation at the request of Dr. Estell Harpin for evaluation of right intertrochanteric femur fracture.  Patient has a history of atrial fibrillation on Coumadin who presents with a ground-level fall with a right intertrochanteric femur fracture.  She was seen initially at [facility].  She was transferred down here and did not arrive until later yesterday evening.  Placed on surgery schedule to proceed with surgical fixation.  I saw her in the preoperative holding area.  Patient is currently comfortable but having pain any attempted range of motion or movement.  She lives at home with her husband.  She occasionally ambulates longer distances with a cane but around the house she does not use an assist device.  She has a previous total knee arthroplasty from Dr. Charlann Boxer.  She is also had rotator cuff surgery with Dr. Shelle Iron.  She denies any numbness or tingling.  She denies any other injuries.  Her INR was 2.8 when she arrived at the emergency room and she received 1 g IV of vitamin K.  Past Medical History:  Diagnosis Date   Aortic stenosis    21 mm Inspiris Edwards AVR June 2024 - [facility]   Arthritis    Cervical incompetence    GERD (gastroesophageal reflux disease)    Hay fever    Heart block    [employer] DCPPM on 10/26/2022 with Dr. Zachery Conch - [facility]   Hemochromatosis    HOCM (hypertrophic obstructive cardiomyopathy) [facility])    Septal myomectomy June 2024 - [facility]   Hypertension    Migraines    Mitral stenosis    25 mm Regent mechanical MVR June 2024 - [facility]   Paroxysmal atrial fibrillation (HCC)    Sciatica    Type 2 diabetes mellitus (HCC)     Past Surgical History:  Procedure Laterality  Date   CARDIAC SURGERY     CARDIOVERSION N/A 02/02/2023   Procedure: CARDIOVERSION;  Surgeon: Marjo Bicker, MD;  Location: [facility] ORS;  Service: Cardiovascular;  Laterality: N/A;   CARPAL TUNNEL RELEASE Right    COLONOSCOPY N/A 08/01/2014   Procedure: COLONOSCOPY;  Surgeon: Malissa Hippo, MD;  Location: [facility] ENDO SUITE;  Service: Endoscopy;  Laterality: N/A;  900 -- moved to 10:00 - Ann notified pt   ELBOW SURGERY     PACEMAKER INSERTION     June 2024   RIGHT/LEFT HEART CATH AND CORONARY ANGIOGRAPHY N/A 07/28/2020   Procedure: RIGHT/LEFT HEART CATH AND CORONARY ANGIOGRAPHY;  Surgeon: Tonny Bollman, MD;  Location: [facility] INVASIVE CV LAB;  Service: Cardiovascular;  Laterality: N/A;   ROTATOR CUFF REPAIR Right 2012   TEE WITHOUT CARDIOVERSION N/A 02/02/2023   Procedure: TRANSESOPHAGEAL ECHOCARDIOGRAM (TEE);  Surgeon: Marjo Bicker, MD;  Location: [facility] ORS;  Service: Cardiovascular;  Laterality: N/A;   TONSILLECTOMY     TOTAL KNEE ARTHROPLASTY Right 10/13/2021   Procedure: TOTAL KNEE ARTHROPLASTY;  Surgeon: Durene Romans, MD;  Location: [facility] ORS;  Service: Orthopedics;  Laterality: Right;   TUBAL LIGATION     WISDOM TOOTH EXTRACTION      Family History  Problem Relation Age of Onset   Heart attack Paternal Grandfather    Migraines Maternal Grandmother    Heart attack Maternal Grandfather  Heart attack Father    Heart murmur Father    Cancer Mother        pancreatic cancer   Heart murmur Brother    Breast cancer Maternal Aunt     Social History:  reports that she quit smoking about 53 years ago. Her smoking use included cigarettes. She started smoking about 60 years ago. She has a 0.7 pack-year smoking history. She has never used smokeless tobacco. She reports that she does not currently use alcohol. She reports that she does not use drugs.  Allergies:  Allergies  Allergen Reactions   Nystatin Other (See Comments)    unknown   Other Other (See Comments)    Product  Containing 3-Hydroxy-3-Methylglutaryl-Coenzyme A Reductase Inhibitor (Product) unknown   Statins Other (See Comments)    Muscles aches,hurt    Medications:  No current facility-administered medications on file prior to encounter.   Current Outpatient Medications on File Prior to Encounter  Medication Sig Dispense Refill   amiodarone (PACERONE) 200 MG tablet Take 1 tablet (200 mg total) by mouth daily. Amiodarone 200 mg BID x 3 weeks followed by amiodarone 200 mg once daily. (Patient taking differently: Take 200 mg by mouth daily.) 90 tablet 3   ezetimibe (ZETIA) 10 MG tablet Take 10 mg by mouth every morning.     losartan (COZAAR) 25 MG tablet Take 25 mg by mouth every morning.     magnesium oxide (MAG-OX) 400 (240 Mg) MG tablet Take 400 mg by mouth 2 (two) times daily.     melatonin 1 MG TABS tablet Take 4 mg by mouth at bedtime as needed (sleep). gummies     metFORMIN (GLUCOPHAGE) 1000 MG tablet Take 1,000 mg by mouth 2 (two) times daily.     metoprolol succinate (TOPROL-XL) 25 MG 24 hr tablet Take 1 tablet (25 mg total) by mouth daily. 30 tablet 2   nitrofurantoin (MACRODANTIN) 50 MG capsule Take 50 mg by mouth every morning.     omeprazole (PRILOSEC) 20 MG capsule Take 20 mg by mouth every morning.  3   Potassium Chloride ER 20 MEQ TBCR Take 1 tablet (20 mEq total) by mouth daily. 60 tablet 2   warfarin (COUMADIN) 3 MG tablet Take 1/2 a tablet to 1 tablet by mouth daily as directed by the coumadin clinic. (Patient taking differently: Take 1.5-3 mg by mouth See admin instructions. Take 1.5 mg (one-half tablet) on Saturdays and Tuesdays Only. Take 3mg  (one tablet) on all other days.) 30 tablet 0    ROS: Constitutional: No fever or chills Vision: No changes in vision ENT: No difficulty swallowing CV: No chest pain Pulm: No SOB or wheezing GI: No nausea or vomiting GU: No urgency or inability to hold urine Skin: No poor wound healing Neurologic: No numbness or tingling Psychiatric: No  depression or anxiety Heme: No bruising Allergic: No reaction to medications or food   Exam: Blood pressure (!) 158/81, pulse (!) 59, temperature 97.7 F (36.5 C), temperature source Oral, resp. rate 18, height 5\' 8"  (1.727 m), weight 106.6 kg, last menstrual period 04/26/1998, SpO2 99%. General: No acute distress Orientation: Awake alert and oriented x 3 Mood and Affect: Cooperative and pleasant Gait: Unable to assess due to her fracture Coordination and balance: Within normal limits   Right lower extremity: Leg is shortened and externally rotated.  Any pain with any attempted range of motion.  She is neurovascularly intact distally and has good motor strength.  Compartments are soft compressible.  No  skin lesions.  Left lower extremity: Skin without lesions. No tenderness to palpation. Full painless ROM, full strength in each muscle groups without evidence of instability.   Medical Decision Making: Data: Imaging: X-rays of the right hip show a intertrochanteric femur fracture with significant shortening and varus angulation.  Labs:  Results for orders placed or performed during the hospital encounter of 06/11/23 (from the past 24 hours)  CBC with Differential     Status: Abnormal   Collection Time: 06/11/23  2:10 PM  Result Value Ref Range   WBC 11.8 (H) 4.0 - 10.5 K/uL   RBC 3.81 (L) 3.87 - 5.11 MIL/uL   Hemoglobin 11.3 (L) 12.0 - 15.0 g/dL   HCT 16.1 (L) 09.6 - 04.5 %   MCV 94.2 80.0 - 100.0 fL   MCH 29.7 26.0 - 34.0 pg   MCHC 31.5 30.0 - 36.0 g/dL   RDW 40.9 81.1 - 91.4 %   Platelets 225 150 - 400 K/uL   nRBC 0.0 0.0 - 0.2 %   Neutrophils Relative % 83 %   Neutro Abs 9.9 (H) 1.7 - 7.7 K/uL   Lymphocytes Relative 9 %   Lymphs Abs 1.0 0.7 - 4.0 K/uL   Monocytes Relative 5 %   Monocytes Absolute 0.6 0.1 - 1.0 K/uL   Eosinophils Relative 1 %   Eosinophils Absolute 0.2 0.0 - 0.5 K/uL   Basophils Relative 1 %   Basophils Absolute 0.1 0.0 - 0.1 K/uL   Immature  Granulocytes 1 %   Abs Immature Granulocytes 0.10 (H) 0.00 - 0.07 K/uL  Protime-INR     Status: Abnormal   Collection Time: 06/11/23  2:10 PM  Result Value Ref Range   Prothrombin Time 30.0 (H) 11.4 - 15.2 seconds   INR 2.8 (H) 0.8 - 1.2  Type and screen     Status: None   Collection Time: 06/11/23  2:10 PM  Result Value Ref Range   ABO/RH(D) B POS    Antibody Screen NEG    Sample Expiration      06/14/2023,2359 Performed at [facility], [address], [place], [place] [address]   Comprehensive metabolic panel     Status: Abnormal   Collection Time: 06/11/23  2:10 PM  Result Value Ref Range   Sodium 136 135 - 145 mmol/L   Potassium 4.6 3.5 - 5.1 mmol/L   Chloride 103 98 - 111 mmol/L   CO2 22 22 - 32 mmol/L   Glucose, Bld 165 (H) 70 - 99 mg/dL   BUN 27 (H) 8 - 23 mg/dL   Creatinine, Ser 6.21 (H) 0.44 - 1.00 mg/dL   Calcium 8.7 (L) 8.9 - 10.3 mg/dL   Total Protein 6.5 6.5 - 8.1 g/dL   Albumin 3.9 3.5 - 5.0 g/dL   AST 26 15 - 41 U/L   ALT 27 0 - 44 U/L   Alkaline Phosphatase 80 38 - 126 U/L   Total Bilirubin 0.8 0.0 - 1.2 mg/dL   GFR, Estimated 42 (L) >60 mL/min   Anion gap 11 5 - 15  I-stat chem 8, ED (not at [facility], [facility] or [facility])     Status: Abnormal   Collection Time: 06/11/23  2:11 PM  Result Value Ref Range   Sodium 137 135 - 145 mmol/L   Potassium 4.7 3.5 - 5.1 mmol/L   Chloride 104 98 - 111 mmol/L   BUN 26 (H) 8 - 23 mg/dL   Creatinine, Ser 3.08 (H) 0.44 - 1.00 mg/dL  Glucose, Bld 159 (H) 70 - 99 mg/dL   Calcium, Ion 5.78 (L) 1.15 - 1.40 mmol/L   TCO2 23 22 - 32 mmol/L   Hemoglobin 12.2 12.0 - 15.0 g/dL   HCT 46.9 62.9 - 52.8 %  CBG monitoring, ED     Status: Abnormal   Collection Time: 06/11/23  5:18 PM  Result Value Ref Range   Glucose-Capillary 140 (H) 70 - 99 mg/dL  Glucose, capillary     Status: Abnormal   Collection Time: 06/11/23 10:06 PM  Result Value Ref Range   Glucose-Capillary 208 (H) 70 - 99 mg/dL  Glucose, capillary     Status: Abnormal    Collection Time: 06/12/23  7:39 AM  Result Value Ref Range   Glucose-Capillary 162 (H) 70 - 99 mg/dL  Glucose, capillary     Status: Abnormal   Collection Time: 06/12/23  7:49 AM  Result Value Ref Range   Glucose-Capillary 138 (H) 70 - 99 mg/dL  CBC     Status: Abnormal   Collection Time: 06/12/23  8:11 AM  Result Value Ref Range   WBC 17.4 (H) 4.0 - 10.5 K/uL   RBC 3.55 (L) 3.87 - 5.11 MIL/uL   Hemoglobin 10.7 (L) 12.0 - 15.0 g/dL   HCT 41.3 (L) 24.4 - 01.0 %   MCV 91.5 80.0 - 100.0 fL   MCH 30.1 26.0 - 34.0 pg   MCHC 32.9 30.0 - 36.0 g/dL   RDW 27.2 53.6 - 64.4 %   Platelets 270 150 - 400 K/uL   nRBC 0.0 0.0 - 0.2 %     Imaging or Labs ordered: Right femur x-rays as well as IV vitamin K  Medical history and chart was reviewed and case discussed with medical provider.  Assessment/Plan: 73 year old female with a right intertrochanteric femur fracture  Due to the unstable nature of her injury I recommend proceeding with cephalomedullary nailing of her right hip.  Risks and benefits were discussed with the patient.  Risks include but not limited to bleeding, infection, malunion, nonunion, hardware failure, hardware irritation, nerve and blood vessel injury, DVT, even the possibility anesthetic complications.  I did discuss with her her INR.  Currently is not back yet.  She did have an INR of 2.8 and it was not ordered a repeat one this morning so I ordered it when I arrived this morning.  Depending on this result we will determine whether we will proceed today or delay for another day pending the associated risks.  She is agreeable and consent will be obtained.  Roby Lofts, MD [facility] [phone] (office) [email or web address]

## 2023-06-12 NOTE — Plan of Care (Signed)
  Problem: Education: Goal: Knowledge of General Education information will improve Description: Including pain rating scale, medication(s)/side effects and non-pharmacologic comfort measures Outcome: Progressing   Problem: Health Behavior/Discharge Planning: Goal: Ability to manage health-related needs will improve Outcome: Progressing   Problem: Clinical Measurements: Goal: Ability to maintain clinical measurements within normal limits will improve Outcome: Progressing Goal: Will remain free from infection Outcome: Progressing Goal: Diagnostic test results will improve Outcome: Progressing Goal: Respiratory complications will improve Outcome: Progressing Goal: Cardiovascular complication will be avoided Outcome: Progressing   Problem: Activity: Goal: Risk for activity intolerance will decrease Outcome: Progressing   Problem: Nutrition: Goal: Adequate nutrition will be maintained Outcome: Progressing   Problem: Coping: Goal: Level of anxiety will decrease Outcome: Progressing   Problem: Elimination: Goal: Will not experience complications related to bowel motility Outcome: Progressing Goal: Will not experience complications related to urinary retention Outcome: Progressing   Problem: Pain Managment: Goal: General experience of comfort will improve and/or be controlled Outcome: Progressing   Problem: Safety: Goal: Ability to remain free from injury will improve Outcome: Progressing   Problem: Skin Integrity: Goal: Risk for impaired skin integrity will decrease Outcome: Progressing   Problem: Education: Goal: Knowledge of General Education information will improve Description: Including pain rating scale, medication(s)/side effects and non-pharmacologic comfort measures Outcome: Progressing   Problem: Health Behavior/Discharge Planning: Goal: Ability to manage health-related needs will improve Outcome: Progressing   Problem: Clinical Measurements: Goal:  Ability to maintain clinical measurements within normal limits will improve Outcome: Progressing Goal: Will remain free from infection Outcome: Progressing Goal: Diagnostic test results will improve Outcome: Progressing Goal: Respiratory complications will improve Outcome: Progressing Goal: Cardiovascular complication will be avoided Outcome: Progressing   Problem: Activity: Goal: Risk for activity intolerance will decrease Outcome: Progressing   Problem: Nutrition: Goal: Adequate nutrition will be maintained Outcome: Progressing   Problem: Coping: Goal: Level of anxiety will decrease Outcome: Progressing   Problem: Elimination: Goal: Will not experience complications related to bowel motility Outcome: Progressing Goal: Will not experience complications related to urinary retention Outcome: Progressing   Problem: Pain Managment: Goal: General experience of comfort will improve and/or be controlled Outcome: Progressing   Problem: Safety: Goal: Ability to remain free from injury will improve Outcome: Progressing   Problem: Skin Integrity: Goal: Risk for impaired skin integrity will decrease Outcome: Progressing   Problem: Education: Goal: Ability to describe self-care measures that may prevent or decrease complications (Diabetes Survival Skills Education) will improve Outcome: Progressing Goal: Individualized Educational Video(s) Outcome: Progressing   Problem: Coping: Goal: Ability to adjust to condition or change in health will improve Outcome: Progressing   Problem: Fluid Volume: Goal: Ability to maintain a balanced intake and output will improve Outcome: Progressing   Problem: Health Behavior/Discharge Planning: Goal: Ability to identify and utilize available resources and services will improve Outcome: Progressing Goal: Ability to manage health-related needs will improve Outcome: Progressing   Problem: Metabolic: Goal: Ability to maintain appropriate  glucose levels will improve Outcome: Progressing   Problem: Nutritional: Goal: Maintenance of adequate nutrition will improve Outcome: Progressing Goal: Progress toward achieving an optimal weight will improve Outcome: Progressing   Problem: Skin Integrity: Goal: Risk for impaired skin integrity will decrease Outcome: Progressing   Problem: Tissue Perfusion: Goal: Adequacy of tissue perfusion will improve Outcome: Progressing

## 2023-06-12 NOTE — TOC CAGE-AID Note (Signed)
Transition of Care [facility]) - CAGE-AID Screening  Patient Details  Name: [name] MRN: [id number] Date of Birth: Oct 02, 1950  Clinical Narrative:  Patient denies any current alcohol and drug use, no need to provide substance abuse resources at this time.  CAGE-AID Screening:   Have You Ever Felt You Ought to Cut Down on Your Drinking or Drug Use?: No Have People Annoyed You By Critizing Your Drinking Or Drug Use?: No Have You Felt Bad Or Guilty About Your Drinking Or Drug Use?: No Have You Ever Had a Drink or Used Drugs First Thing In The Morning to Steady Your Nerves or to Get Rid of a Hangover?: No CAGE-AID Score: 0  Substance Abuse Education Offered: No

## 2023-06-12 NOTE — Progress Notes (Signed)
PHARMACY - ANTICOAGULATION CONSULT NOTE  Pharmacy Consult for warfarin Indication: atrial fibrillation  Allergies  Allergen Reactions   Nystatin Other (See Comments)    unknown   Other Other (See Comments)    Product Containing 3-Hydroxy-3-Methylglutaryl-Coenzyme A Reductase Inhibitor (Product) unknown   Statins Other (See Comments)    Muscles aches,hurt    Patient Measurements: Height: 5\' 8"  (172.7 cm) Weight: 106.6 kg (235 lb) IBW/kg (Calculated) : 63.9  Vital Signs: Temp: 97.6 F (36.4 C) (02/16 1249) Temp Source: Oral (02/16 0846) BP: 146/63 (02/16 1249) Pulse Rate: 56 (02/16 1249)  Labs: Recent Labs    06/11/23 1410 06/11/23 1411 06/12/23 0811  HGB 11.3* 12.2 10.7*  HCT 35.9* 36.0 32.5*  PLT 225  --  270  LABPROT 30.0*  --  23.0*  INR 2.8*  --  2.0*  CREATININE 1.32* 1.50* 1.35*    Estimated Creatinine Clearance: 46 mL/min (A) (by C-G formula based on SCr of 1.35 mg/dL (H)).   Medical History: Past Medical History:  Diagnosis Date   Aortic stenosis    21 mm Inspiris Edwards AVR June 2024 - [facility]   Arthritis    Cervical incompetence    GERD (gastroesophageal reflux disease)    Hay fever    Heart block    [employer] DCPPM on 10/26/2022 with Dr. Zachery Conch - [facility]   Hemochromatosis    HOCM (hypertrophic obstructive cardiomyopathy) [facility])    Septal myomectomy June 2024 - [facility]   Hypertension    Migraines    Mitral stenosis    25 mm [employer]ent mechanical MVR June 2024 - [facility]   Paroxysmal atrial fibrillation (HCC)    Sciatica    Type 2 diabetes mellitus (HCC)     Assessment: 74 YOF admitted for mechanical fall. Patient on warfarin PTA for atrial fibrillation. Last dose of warfarin received prior to admission on 2/15 and has been held since. Patient underwent surgery for right femur fracture 2/16. Pharmacy consulted to dose warfarin.    PTA regimen: 1.5 mg daily on Sat/Tues and 3 mg daily for all other days  INR today 2.0 within therapeutic range.  Decreased from INR 2.8 yesterday; however, patient did also receive vitamin K 1 mg x1 on 2/15 prior to surgery. Per ortho, start warfarin on POD #1 without bridging therapy.   Goal of Therapy:  INR 2-3 Monitor platelets by anticoagulation protocol: Yes   Plan:  Hold warfarin dose today. Tentative plan to restart warfarin tomorrow pending maintained clinical stability  Monitor daily INR and CBC Continue to monitor H&H    Jerry Caras, PharmD PGY2 Oncology Pharmacy Resident   06/12/2023 1:36 PM

## 2023-06-12 NOTE — Op Note (Signed)
Orthopaedic Surgery Operative Note (CSN: [id number] ) Date of Surgery: 06/12/2023  Admit Date: 06/11/2023   Diagnoses: Pre-Op Diagnoses: Right intertrochanteric femur fracture  Post-Op Diagnosis: Same  Procedures: CPT 27245-Cephalomedullary nailing of right intertrochanteric femur fracture  Surgeons : Primary: Roby Lofts, MD  Assistant: None  Location: OR 3   Anesthesia: General   Antibiotics: Ancef 2g preop   Tourniquet time: None    Estimated Blood Loss: 350 mL  Complications:* No complications entered in OR log *   Specimens:* No specimens in log *   Implants: Implant Name Type Inv. Item Serial No. Manufacturer Lot No. LRB No. Used Action  NAIL LOCK CANN 10X380 130D RT - [id number] Nail NAIL LOCK CANN 10X380 130D RT  [employer] [id number] Right 1 Implanted  SCREW LAG COMPR KIT 110/105 - [id number] Screw SCREW LAG COMPR KIT 110/105  [employer] [id number] Right 1 Implanted  SCREW TRIGEN LOW PROF 5.0X40 - [id number] Screw SCREW TRIGEN LOW PROF 5.0X40  [employer] [id number] Right 1 Implanted     Indications for Surgery: 73 year old female who sustained a ground-level fall with a right intertrochanteric femur fracture.  Due to the unstable nature of her injury I recommend proceeding with cephalomedullary nailing of her right hip.  Risks and benefits were discussed with the patient.  Risks included but not limited to bleeding, infection, malunion, nonunion, hardware failure, hardware rotation, nerve and blood vessel injury, DVT, even the possibility anesthetic complications.  She agreed to proceed with surgery and consent was obtained.  Operative Findings: Cephalomedullary nailing of right intertrochanteric femur fracture using [employer] InterTAN 10 x 380 mm nail with a 110 mm lag screw with 105 mm compression screw.  Procedure: The patient was identified in the preoperative holding area. Consent was confirmed with  the patient and their family and all questions were answered. The operative extremity was marked after confirmation with the patient. she was then brought back to the operating room by our anesthesia colleagues.  She was placed under general anesthetic and carefully transferred over to the [facility] table.  All bony prominences were well-padded.  Fluoroscopic imaging showed the unstable nature of her injury.  Traction was applied to align the fracture.  The right lower extremity was then prepped and draped in usual sterile fashion.  A timeout was performed to verify the patient, the procedure, and the extremity.  Preoperative antibiotics were dosed.  A small incision proximal to the greater trochanter was made and carried down through skin subcutaneous tissue.  I then directed a threaded guidewire at the tip of the greater trochanter and advanced into the proximal metaphysis.  I then used an entry reamer to enter the medullary canal.  I then passed a ball-tipped guidewire in the center of the canal and seated it into the distal metaphysis.  I then measured the length of the ball-tipped wire and chose to use a 380 mm nail.  I then reamed from 10 mm to 11.5 mm and then proceeded to place a 10 x 380 mm nail attached to a targeting arm.  The nail was placed on the center of the canal and seated appropriately.  I then used the targeting arm to direct a threaded guidewire up into the head/neck segment.  I confirmed adequate tip apex distance using fluoroscopy.  I then drilled path for the compression screw and placed an antirotation bar.  I then drilled the path for the lag screw and placed 110 mm  lag screw.  I then placed the 105 mm compression screw and compressed approximately 5 mm.  I then statically locked the proximal portion of the nail.  I then removed the targeting arm and used perfect circle technique to place a lateral to medial distal interlocking screw.  Final fluoroscopic imaging was obtained.  The incisions  were irrigated and closed with 2-0 Monocryl and Dermabond.  Sterile dressings were applied.  The patient was then awoke from anesthesia and taken to the PACU in stable condition.  Post Op Plan/Instructions: The patient be weightbearing as tolerated to the right lower extremity.  She will receive postoperative Ancef.  She will be restarted on her Coumadin tomorrow if her hemoglobin is stable.  Will have her mobilize with physical occupational therapy.  I was present and performed the entire surgery.  Truitt Merle, MD [facility]

## 2023-06-12 NOTE — Progress Notes (Signed)
   [name]  HQI:[id number] DOB: 11/28/1950 DOA: 06/11/2023 PCP: Assunta Found, MD    Brief Narrative:  73 year old with a history of heart block status post pacemaker placement, atrial fibrillation, aortic stenosis status post aortic valve replacement, hypertrophic cardiomyopathy, HTN, DM2, and obesity who presented to the [facility] ER after suffering a mechanical fall at home followed by severe pain of the right hip and the inability to stand.  Imaging in the ER revealed a R comminuted intertrochanteric femur fracture.  Goals of Care:   Code Status: Full Code   DVT prophylaxis: warfarin  Interim Hx: Afebrile.  Vital signs stable.  Resting comfortably in the bed postoperatively.  Has no new complaints.  States that her pain is currently well-controlled.  Denies shortness of breath.  Assessment & Plan:  Closed right intertrochanteric femur fracture Management per orthopedic surgery - to OR 2/16  HTN Continue usual metoprolol and losartan -monitor trend  Permanent atrial fibrillation on warfarin therapy Continue usual amiodarone and metoprolol -  resume warfarin now that patient post-op  CKD stage IIIb Baseline creatinine approximately 1.5 -creatinine stable  DM2 CBG well-controlled  Obesity - Body mass index is 35.73 kg/m.   Family Communication: No family present at time of exam Disposition: Disposition will depend upon performance with PT/OT   Objective: Blood pressure (!) 156/68, pulse (!) 59, temperature 97.7 F (36.5 C), temperature source Oral, resp. rate 16, height 5\' 8"  (1.727 m), weight 106.6 kg, last menstrual period 04/26/1998, SpO2 92%. No intake or output data in the 24 hours ending 06/12/23 1027 Filed Weights   06/11/23 1333  Weight: 106.6 kg    Examination: General: No acute respiratory distress Lungs: Clear to auscultation bilaterally without wheezes or crackles Cardiovascular: Regular rate and rhythm without murmur gallop or rub normal S1 and  S2 Abdomen: Nontender, nondistended, soft, bowel sounds positive, no rebound, no ascites, no appreciable mass Extremities: No significant cyanosis, clubbing, or edema bilateral lower extremities  CBC: Recent Labs  Lab 06/11/23 1410 06/11/23 1411 06/12/23 0811  WBC 11.8*  --  17.4*  NEUTROABS 9.9*  --   --   HGB 11.3* 12.2 10.7*  HCT 35.9* 36.0 32.5*  MCV 94.2  --  91.5  PLT 225  --  270   Basic Metabolic Panel: Recent Labs  Lab 06/11/23 1410 06/11/23 1411 06/12/23 0811  NA 136 137 134*  K 4.6 4.7 4.7  CL 103 104 101  CO2 22  --  23  GLUCOSE 165* 159* 153*  BUN 27* 26* 26*  CREATININE 1.32* 1.50* 1.35*  CALCIUM 8.7*  --  8.8*   GFR: Estimated Creatinine Clearance: 46 mL/min (A) (by C-G formula based on SCr of 1.35 mg/dL (H)).   Scheduled Meds:  [MAR Hold] amiodarone  200 mg Oral Daily   [MAR Hold] ezetimibe  10 mg Oral Daily   [MAR Hold] insulin aspart  0-9 Units Subcutaneous TID WC   [MAR Hold] losartan  25 mg Oral Daily   [MAR Hold] magnesium oxide  400 mg Oral Daily   [MAR Hold] metoprolol succinate  25 mg Oral Daily   [MAR Hold] pantoprazole  40 mg Oral Daily   Continuous Infusions:  ceFAZolin     lactated ringers 10 mL/hr at 06/12/23 0857     LOS: 1 day   Lonia Blood, MD [facility] Office  [phone] Pager - Text Page per Loretha Stapler  If 7PM-7AM, please contact night-coverage per Amion 06/12/2023, 10:27 AM

## 2023-06-12 NOTE — Anesthesia Preprocedure Evaluation (Addendum)
Anesthesia Evaluation  Patient identified by MRN, date of birth, ID band Patient awake    Reviewed: Allergy & Precautions, H&P , NPO status , Patient's Chart, lab work & pertinent test results  Airway Mallampati: III  TM Distance: >3 FB Neck ROM: Full    Dental no notable dental hx. (+) Teeth Intact, Dental Advisory Given   Pulmonary former smoker   Pulmonary exam normal breath sounds clear to auscultation       Cardiovascular hypertension, Pt. on medications and Pt. on home beta blockers + dysrhythmias Atrial Fibrillation  Rhythm:Regular Rate:Normal     Neuro/Psych  Headaches  negative psych ROS   GI/Hepatic Neg liver ROS,GERD  Medicated,,  Endo/Other  diabetes, Type 2, Oral Hypoglycemic Agents    Renal/GU Renal InsufficiencyRenal disease  negative genitourinary   Musculoskeletal  (+) Arthritis ,    Abdominal   Peds  Hematology negative hematology ROS (+)   Anesthesia Other Findings   Reproductive/Obstetrics negative OB ROS                             Anesthesia Physical Anesthesia Plan  ASA: 3  Anesthesia Plan: General   Post-op Pain Management: Ofirmev IV (intra-op)*   Induction: Intravenous  PONV Risk Score and Plan: 4 or greater and Ondansetron, Dexamethasone and Treatment may vary due to age or medical condition  Airway Management Planned: Oral ETT  Additional Equipment:   Intra-op Plan:   Post-operative Plan: Extubation in OR  Informed Consent: I have reviewed the patients History and Physical, chart, labs and discussed the procedure including the risks, benefits and alternatives for the proposed anesthesia with the patient or authorized representative who has indicated his/her understanding and acceptance.     Dental advisory given  Plan Discussed with: CRNA  Anesthesia Plan Comments:        Anesthesia Quick Evaluation

## 2023-06-12 NOTE — H&P (View-Only) (Signed)
Orthopaedic Trauma Service (OTS) Consult   Patient ID: [name] MRN: [id number] DOB/AGE: [birth year] 73 y.o.  Reason for Consult:Right intertrochanteric femur fracture Referring Physician: Dr. Bethann Berkshire, MD [facility]  HPI: [name] is an 73 y.o. female who is being seen in consultation at the request of Dr. Estell Harpin for evaluation of right intertrochanteric femur fracture.  Patient has a history of atrial fibrillation on Coumadin who presents with a ground-level fall with a right intertrochanteric femur fracture.  She was seen initially at [facility].  She was transferred down here and did not arrive until later yesterday evening.  Placed on surgery schedule to proceed with surgical fixation.  I saw her in the preoperative holding area.  Patient is currently comfortable but having pain any attempted range of motion or movement.  She lives at home with her husband.  She occasionally ambulates longer distances with a cane but around the house she does not use an assist device.  She has a previous total knee arthroplasty from Dr. Charlann Boxer.  She is also had rotator cuff surgery with Dr. Shelle Iron.  She denies any numbness or tingling.  She denies any other injuries.  Her INR was 2.8 when she arrived at the emergency room and she received 1 g IV of vitamin K.  Past Medical History:  Diagnosis Date   Aortic stenosis    21 mm Inspiris Edwards AVR June 2024 - [facility]   Arthritis    Cervical incompetence    GERD (gastroesophageal reflux disease)    Hay fever    Heart block    [employer] DCPPM on 10/26/2022 with Dr. Zachery Conch - [facility]   Hemochromatosis    HOCM (hypertrophic obstructive cardiomyopathy) [facility])    Septal myomectomy June 2024 - [facility]   Hypertension    Migraines    Mitral stenosis    25 mm [employer]ent mechanical MVR June 2024 - [facility]   Paroxysmal atrial fibrillation (HCC)    Sciatica    Type 2 diabetes mellitus (HCC)     Past Surgical History:  Procedure Laterality  Date   CARDIAC SURGERY     CARDIOVERSION N/A 02/02/2023   Procedure: CARDIOVERSION;  Surgeon: Marjo Bicker, MD;  Location: [facility] ORS;  Service: Cardiovascular;  Laterality: N/A;   CARPAL TUNNEL RELEASE Right    COLONOSCOPY N/A 08/01/2014   Procedure: COLONOSCOPY;  Surgeon: Malissa Hippo, MD;  Location: [facility] ENDO SUITE;  Service: Endoscopy;  Laterality: N/A;  900 -- moved to 10:00 - Ann notified pt   ELBOW SURGERY     PACEMAKER INSERTION     June 2024   RIGHT/LEFT HEART CATH AND CORONARY ANGIOGRAPHY N/A 07/28/2020   Procedure: RIGHT/LEFT HEART CATH AND CORONARY ANGIOGRAPHY;  Surgeon: Tonny Bollman, MD;  Location: [facility] INVASIVE CV LAB;  Service: Cardiovascular;  Laterality: N/A;   ROTATOR CUFF REPAIR Right 2012   TEE WITHOUT CARDIOVERSION N/A 02/02/2023   Procedure: TRANSESOPHAGEAL ECHOCARDIOGRAM (TEE);  Surgeon: Marjo Bicker, MD;  Location: [facility] ORS;  Service: Cardiovascular;  Laterality: N/A;   TONSILLECTOMY     TOTAL KNEE ARTHROPLASTY Right 10/13/2021   Procedure: TOTAL KNEE ARTHROPLASTY;  Surgeon: Durene Romans, MD;  Location: [facility] ORS;  Service: Orthopedics;  Laterality: Right;   TUBAL LIGATION     WISDOM TOOTH EXTRACTION      Family History  Problem Relation Age of Onset   Heart attack Paternal Grandfather    Migraines Maternal Grandmother    Heart attack Maternal Grandfather  Heart attack Father    Heart murmur Father    Cancer Mother        pancreatic cancer   Heart murmur Brother    Breast cancer Maternal Aunt     Social History:  reports that she quit smoking about 53 years ago. Her smoking use included cigarettes. She started smoking about 60 years ago. She has a 0.7 pack-year smoking history. She has never used smokeless tobacco. She reports that she does not currently use alcohol. She reports that she does not use drugs.  Allergies:  Allergies  Allergen Reactions   Nystatin Other (See Comments)    unknown   Other Other (See Comments)    Product  Containing 3-Hydroxy-3-Methylglutaryl-Coenzyme A Reductase Inhibitor (Product) unknown   Statins Other (See Comments)    Muscles aches,hurt    Medications:  No current facility-administered medications on file prior to encounter.   Current Outpatient Medications on File Prior to Encounter  Medication Sig Dispense Refill   amiodarone (PACERONE) 200 MG tablet Take 1 tablet (200 mg total) by mouth daily. Amiodarone 200 mg BID x 3 weeks followed by amiodarone 200 mg once daily. (Patient taking differently: Take 200 mg by mouth daily.) 90 tablet 3   ezetimibe (ZETIA) 10 MG tablet Take 10 mg by mouth every morning.     losartan (COZAAR) 25 MG tablet Take 25 mg by mouth every morning.     magnesium oxide (MAG-OX) 400 (240 Mg) MG tablet Take 400 mg by mouth 2 (two) times daily.     melatonin 1 MG TABS tablet Take 4 mg by mouth at bedtime as needed (sleep). gummies     metFORMIN (GLUCOPHAGE) 1000 MG tablet Take 1,000 mg by mouth 2 (two) times daily.     metoprolol succinate (TOPROL-XL) 25 MG 24 hr tablet Take 1 tablet (25 mg total) by mouth daily. 30 tablet 2   nitrofurantoin (MACRODANTIN) 50 MG capsule Take 50 mg by mouth every morning.     omeprazole (PRILOSEC) 20 MG capsule Take 20 mg by mouth every morning.  3   Potassium Chloride ER 20 MEQ TBCR Take 1 tablet (20 mEq total) by mouth daily. 60 tablet 2   warfarin (COUMADIN) 3 MG tablet Take 1/2 a tablet to 1 tablet by mouth daily as directed by the coumadin clinic. (Patient taking differently: Take 1.5-3 mg by mouth See admin instructions. Take 1.5 mg (one-half tablet) on Saturdays and Tuesdays Only. Take 3mg  (one tablet) on all other days.) 30 tablet 0    ROS: Constitutional: No fever or chills Vision: No changes in vision ENT: No difficulty swallowing CV: No chest pain Pulm: No SOB or wheezing GI: No nausea or vomiting GU: No urgency or inability to hold urine Skin: No poor wound healing Neurologic: No numbness or tingling Psychiatric: No  depression or anxiety Heme: No bruising Allergic: No reaction to medications or food   Exam: Blood pressure (!) 158/81, pulse (!) 59, temperature 97.7 F (36.5 C), temperature source Oral, resp. rate 18, height 5\' 8"  (1.727 m), weight 106.6 kg, last menstrual period 04/26/1998, SpO2 99%. General: No acute distress Orientation: Awake alert and oriented x 3 Mood and Affect: Cooperative and pleasant Gait: Unable to assess due to her fracture Coordination and balance: Within normal limits   Right lower extremity: Leg is shortened and externally rotated.  Any pain with any attempted range of motion.  She is neurovascularly intact distally and has good motor strength.  Compartments are soft compressible.  No  skin lesions.  Left lower extremity: Skin without lesions. No tenderness to palpation. Full painless ROM, full strength in each muscle groups without evidence of instability.   Medical Decision Making: Data: Imaging: X-rays of the right hip show a intertrochanteric femur fracture with significant shortening and varus angulation.  Labs:  Results for orders placed or performed during the hospital encounter of 06/11/23 (from the past 24 hours)  CBC with Differential     Status: Abnormal   Collection Time: 06/11/23  2:10 PM  Result Value Ref Range   WBC 11.8 (H) 4.0 - 10.5 K/uL   RBC 3.81 (L) 3.87 - 5.11 MIL/uL   Hemoglobin 11.3 (L) 12.0 - 15.0 g/dL   HCT 16.1 (L) 09.6 - 04.5 %   MCV 94.2 80.0 - 100.0 fL   MCH 29.7 26.0 - 34.0 pg   MCHC 31.5 30.0 - 36.0 g/dL   RDW 40.9 81.1 - 91.4 %   Platelets 225 150 - 400 K/uL   nRBC 0.0 0.0 - 0.2 %   Neutrophils Relative % 83 %   Neutro Abs 9.9 (H) 1.7 - 7.7 K/uL   Lymphocytes Relative 9 %   Lymphs Abs 1.0 0.7 - 4.0 K/uL   Monocytes Relative 5 %   Monocytes Absolute 0.6 0.1 - 1.0 K/uL   Eosinophils Relative 1 %   Eosinophils Absolute 0.2 0.0 - 0.5 K/uL   Basophils Relative 1 %   Basophils Absolute 0.1 0.0 - 0.1 K/uL   Immature  Granulocytes 1 %   Abs Immature Granulocytes 0.10 (H) 0.00 - 0.07 K/uL  Protime-INR     Status: Abnormal   Collection Time: 06/11/23  2:10 PM  Result Value Ref Range   Prothrombin Time 30.0 (H) 11.4 - 15.2 seconds   INR 2.8 (H) 0.8 - 1.2  Type and screen     Status: None   Collection Time: 06/11/23  2:10 PM  Result Value Ref Range   ABO/RH(D) B POS    Antibody Screen NEG    Sample Expiration      06/14/2023,2359 Performed at [facility], [address], [place], [place] [address]   Comprehensive metabolic panel     Status: Abnormal   Collection Time: 06/11/23  2:10 PM  Result Value Ref Range   Sodium 136 135 - 145 mmol/L   Potassium 4.6 3.5 - 5.1 mmol/L   Chloride 103 98 - 111 mmol/L   CO2 22 22 - 32 mmol/L   Glucose, Bld 165 (H) 70 - 99 mg/dL   BUN 27 (H) 8 - 23 mg/dL   Creatinine, Ser 6.21 (H) 0.44 - 1.00 mg/dL   Calcium 8.7 (L) 8.9 - 10.3 mg/dL   Total Protein 6.5 6.5 - 8.1 g/dL   Albumin 3.9 3.5 - 5.0 g/dL   AST 26 15 - 41 U/L   ALT 27 0 - 44 U/L   Alkaline Phosphatase 80 38 - 126 U/L   Total Bilirubin 0.8 0.0 - 1.2 mg/dL   GFR, Estimated 42 (L) >60 mL/min   Anion gap 11 5 - 15  I-stat chem 8, ED (not at [facility], [facility] or [facility])     Status: Abnormal   Collection Time: 06/11/23  2:11 PM  Result Value Ref Range   Sodium 137 135 - 145 mmol/L   Potassium 4.7 3.5 - 5.1 mmol/L   Chloride 104 98 - 111 mmol/L   BUN 26 (H) 8 - 23 mg/dL   Creatinine, Ser 3.08 (H) 0.44 - 1.00 mg/dL  Glucose, Bld 159 (H) 70 - 99 mg/dL   Calcium, Ion 5.78 (L) 1.15 - 1.40 mmol/L   TCO2 23 22 - 32 mmol/L   Hemoglobin 12.2 12.0 - 15.0 g/dL   HCT 46.9 62.9 - 52.8 %  CBG monitoring, ED     Status: Abnormal   Collection Time: 06/11/23  5:18 PM  Result Value Ref Range   Glucose-Capillary 140 (H) 70 - 99 mg/dL  Glucose, capillary     Status: Abnormal   Collection Time: 06/11/23 10:06 PM  Result Value Ref Range   Glucose-Capillary 208 (H) 70 - 99 mg/dL  Glucose, capillary     Status: Abnormal    Collection Time: 06/12/23  7:39 AM  Result Value Ref Range   Glucose-Capillary 162 (H) 70 - 99 mg/dL  Glucose, capillary     Status: Abnormal   Collection Time: 06/12/23  7:49 AM  Result Value Ref Range   Glucose-Capillary 138 (H) 70 - 99 mg/dL  CBC     Status: Abnormal   Collection Time: 06/12/23  8:11 AM  Result Value Ref Range   WBC 17.4 (H) 4.0 - 10.5 K/uL   RBC 3.55 (L) 3.87 - 5.11 MIL/uL   Hemoglobin 10.7 (L) 12.0 - 15.0 g/dL   HCT 41.3 (L) 24.4 - 01.0 %   MCV 91.5 80.0 - 100.0 fL   MCH 30.1 26.0 - 34.0 pg   MCHC 32.9 30.0 - 36.0 g/dL   RDW 27.2 53.6 - 64.4 %   Platelets 270 150 - 400 K/uL   nRBC 0.0 0.0 - 0.2 %     Imaging or Labs ordered: Right femur x-rays as well as IV vitamin K  Medical history and chart was reviewed and case discussed with medical provider.  Assessment/Plan: 73 year old female with a right intertrochanteric femur fracture  Due to the unstable nature of her injury I recommend proceeding with cephalomedullary nailing of her right hip.  Risks and benefits were discussed with the patient.  Risks include but not limited to bleeding, infection, malunion, nonunion, hardware failure, hardware irritation, nerve and blood vessel injury, DVT, even the possibility anesthetic complications.  I did discuss with her her INR.  Currently is not back yet.  She did have an INR of 2.8 and it was not ordered a repeat one this morning so I ordered it when I arrived this morning.  Depending on this result we will determine whether we will proceed today or delay for another day pending the associated risks.  She is agreeable and consent will be obtained.  Roby Lofts, MD [facility] [phone] (office) [email or web address]

## 2023-06-12 NOTE — Transfer of Care (Signed)
Immediate Anesthesia Transfer of Care Note  Patient: [name]  Procedure(s) Performed: INTRAMEDULLARY (IM) NAIL INTERTROCHANTERIC (Right)  Patient Location: PACU  Anesthesia Type:General  Level of Consciousness: awake, alert , and oriented  Airway & Oxygen Therapy: Patient Spontanous Breathing and Patient connected to nasal cannula oxygen  Post-op Assessment: Report given to RN and Post -op Vital signs reviewed and stable  Post vital signs: Reviewed and stable  Last Vitals:  Vitals Value Taken Time  BP 130/80 06/12/23 1215  Temp 36.6 C 06/12/23 1214  Pulse 68 06/12/23 1221  Resp 23 06/12/23 1221  SpO2 100 % 06/12/23 1221  Vitals shown include unfiled device data.  Last Pain:  Vitals:   06/12/23 1214  TempSrc:   PainSc: Asleep         Complications: No notable events documented.

## 2023-06-12 NOTE — Plan of Care (Signed)
  Problem: Safety: Goal: Ability to remain free from injury will improve Outcome: Progressing   Problem: Skin Integrity: Goal: Risk for impaired skin integrity will decrease Outcome: Progressing   Problem: Pain Managment: Goal: General experience of comfort will improve and/or be controlled Outcome: Progressing   Problem: Elimination: Goal: Will not experience complications related to bowel motility Outcome: Progressing   Problem: Coping: Goal: Level of anxiety will decrease Outcome: Progressing   Problem: Nutrition: Goal: Adequate nutrition will be maintained Outcome: Progressing

## 2023-06-12 NOTE — Anesthesia Procedure Notes (Signed)
Procedure Name: Intubation Date/Time: 06/12/2023 11:01 AM  Performed by: Jodell Cipro, CRNAPre-anesthesia Checklist: Patient identified, Emergency Drugs available, Suction available and Patient being monitored Patient Re-evaluated:Patient Re-evaluated prior to induction Oxygen Delivery Method: Circle System Utilized Preoxygenation: Pre-oxygenation with 100% oxygen Induction Type: IV induction Ventilation: Mask ventilation without difficulty Laryngoscope Size: Mac and 3 Grade View: Grade I Tube type: Oral Tube size: 7.0 mm Number of attempts: 1 Airway Equipment and Method: Stylet and Oral airway Placement Confirmation: ETT inserted through vocal cords under direct vision, positive ETCO2 and breath sounds checked- equal and bilateral Secured at: 21 cm Tube secured with: Tape Dental Injury: Teeth and Oropharynx as per pre-operative assessment

## 2023-06-12 NOTE — Anesthesia Postprocedure Evaluation (Signed)
Anesthesia Post Note  Patient: Dianelly Ferran Millican  Procedure(s) Performed: INTRAMEDULLARY (IM) NAIL INTERTROCHANTERIC (Right)     Patient location during evaluation: PACU Anesthesia Type: General Level of consciousness: awake and alert Pain management: pain level controlled Vital Signs Assessment: post-procedure vital signs reviewed and stable Respiratory status: spontaneous breathing, nonlabored ventilation, respiratory function stable and patient connected to nasal cannula oxygen Cardiovascular status: blood pressure returned to baseline and stable Postop Assessment: no apparent nausea or vomiting Anesthetic complications: no  No notable events documented.  Last Vitals:  Vitals:   06/12/23 1230 06/12/23 1249  BP: (!) 141/54 (!) 146/63  Pulse: 60 (!) 56  Resp: 18 19  Temp: 36.6 C 36.4 C  SpO2: 93% 96%    Last Pain:  Vitals:   06/12/23 1214  TempSrc:   PainSc: Asleep                 Darya Bigler,W. EDMOND

## 2023-06-12 NOTE — Progress Notes (Signed)
Orthopedic Tech Progress Note Patient Details:  [name] May 16, 1950 [id number]  Patient ID: [name], female   DOB: March 25, 1951, 73 y.o.   MRN: [id number] Pt doesn't meet criteria for ohf. Pt must be under 70 to get ohf. Trinna Post 06/12/2023, 10:28 PM

## 2023-06-12 NOTE — Interval H&P Note (Signed)
History and Physical Interval Note:  06/12/2023 8:42 AM  [name]  has presented today for surgery, with the diagnosis of Right intertrochanteric femur fractuer.  The various methods of treatment have been discussed with the patient and family. After consideration of risks, benefits and other options for treatment, the patient has consented to  Procedure(s): INTRAMEDULLARY (IM) NAIL INTERTROCHANTERIC (Right) as a surgical intervention.  The patient's history has been reviewed, patient examined, no change in status, stable for surgery.  I have reviewed the patient's chart and labs.  Questions were answered to the patient's satisfaction.     Caryn Bee P Firmin Belisle

## 2023-06-12 NOTE — Progress Notes (Signed)
Verified tele has box [id number]

## 2023-06-13 ENCOUNTER — Encounter (HOSPITAL_COMMUNITY): Payer: Self-pay | Admitting: Student

## 2023-06-13 DIAGNOSIS — S72101D Unspecified trochanteric fracture of right femur, subsequent encounter for closed fracture with routine healing: Secondary | ICD-10-CM | POA: Diagnosis not present

## 2023-06-13 LAB — CBC
HCT: 24.2 % — ABNORMAL LOW (ref 36.0–46.0)
HCT: 24 % — ABNORMAL LOW (ref 36.0–46.0)
Hemoglobin: 7.9 g/dL — ABNORMAL LOW (ref 12.0–15.0)
Hemoglobin: 7.7 g/dL — ABNORMAL LOW (ref 12.0–15.0)
MCH: 30 pg (ref 26.0–34.0)
MCH: 29.6 pg (ref 26.0–34.0)
MCHC: 32.6 g/dL (ref 30.0–36.0)
MCHC: 32.1 g/dL (ref 30.0–36.0)
MCV: 92 fL (ref 80.0–100.0)
MCV: 92.3 fL (ref 80.0–100.0)
Platelets: 213 10*3/uL (ref 150–400)
Platelets: 227 10*3/uL (ref 150–400)
RBC: 2.63 MIL/uL — ABNORMAL LOW (ref 3.87–5.11)
RBC: 2.6 MIL/uL — ABNORMAL LOW (ref 3.87–5.11)
RDW: 13.6 % (ref 11.5–15.5)
RDW: 13.7 % (ref 11.5–15.5)
WBC: 17 10*3/uL — ABNORMAL HIGH (ref 4.0–10.5)
WBC: 17.8 10*3/uL — ABNORMAL HIGH (ref 4.0–10.5)
nRBC: 0 % (ref 0.0–0.2)
nRBC: 0 % (ref 0.0–0.2)

## 2023-06-13 LAB — GLUCOSE, CAPILLARY
Glucose-Capillary: 294 mg/dL — ABNORMAL HIGH (ref 70–99)
Glucose-Capillary: 207 mg/dL — ABNORMAL HIGH (ref 70–99)
Glucose-Capillary: 168 mg/dL — ABNORMAL HIGH (ref 70–99)
Glucose-Capillary: 204 mg/dL — ABNORMAL HIGH (ref 70–99)

## 2023-06-13 LAB — BASIC METABOLIC PANEL
Anion gap: 11 (ref 5–15)
BUN: 30 mg/dL — ABNORMAL HIGH (ref 8–23)
CO2: 23 mmol/L (ref 22–32)
Calcium: 8.8 mg/dL — ABNORMAL LOW (ref 8.9–10.3)
Chloride: 100 mmol/L (ref 98–111)
Creatinine, Ser: 1.58 mg/dL — ABNORMAL HIGH (ref 0.44–1.00)
GFR, Estimated: 34 mL/min — ABNORMAL LOW (ref >60)
Glucose, Bld: 246 mg/dL — ABNORMAL HIGH (ref 70–99)
Potassium: 4.5 mmol/L (ref 3.5–5.1)
Sodium: 134 mmol/L — ABNORMAL LOW (ref 135–145)

## 2023-06-13 LAB — MAGNESIUM: Magnesium: 2.1 mg/dL (ref 1.7–2.4)

## 2023-06-13 LAB — PROTIME-INR
INR: 1.7 — ABNORMAL HIGH (ref 0.8–1.2)
Prothrombin Time: 19.9 s — ABNORMAL HIGH (ref 11.4–15.2)

## 2023-06-13 MED ORDER — INSULIN GLARGINE-YFGN 100 UNIT/ML ~~LOC~~ SOLN
12.0000 [IU] | Freq: Every day | SUBCUTANEOUS | Status: DC
  Administered 2023-06-13 – 2023-06-17 (×5): 12 [IU] via SUBCUTANEOUS
  Filled 2023-06-13 (×5): qty 0.12

## 2023-06-13 MED ORDER — WARFARIN SODIUM 4 MG PO TABS
4.0000 mg | ORAL_TABLET | Freq: Once | ORAL | Status: AC
  Administered 2023-06-13: 4 mg via ORAL
  Filled 2023-06-13: qty 1

## 2023-06-13 MED ORDER — VITAMIN D (ERGOCALCIFEROL) 1.25 MG (50000 UNIT) PO CAPS
50000.0000 [IU] | ORAL_CAPSULE | ORAL | Status: DC
  Administered 2023-06-13: 50000 [IU] via ORAL
  Filled 2023-06-13: qty 1

## 2023-06-13 MED ORDER — TOLNAFTATE 1 % EX POWD: Freq: Two times a day (BID) | CUTANEOUS | Status: DC | PRN

## 2023-06-13 NOTE — Evaluation (Signed)
Occupational Therapy Evaluation Patient Details Name: [name] MRN: [id number] DOB: [birth year] Today's Date: 06/13/2023   History of Present Illness   73 yo female fall at home s/p 2/16 R IMN femur PMH heart block with pacemaker, atrial fibrillation, aortic stenosis s/p AVR, hypertrophic cardiomyopathy, HTN DM2 R TKA, R rotator cuff repair and obesity     Clinical Impressions Patient is s/p R IMN hip surgery resulting in functional limitations due to the deficits listed below (see OT problem list). Pt was indep with all adls prior to admission and using SPC outside the home. Pt has two small dogs that she cares for as well. Pt at this time able to stand pivot to [facility] and then chair with min-Mod (A) with RW. Pt will need to increase able to step in RW to advance to home. Pt has expressed concerns that she could be d/c home on Wednesday with snow on the ground. Pt reports spouse is a very able 28 yo but leaving with snow could be a big concern.  Patient will benefit from skilled OT acutely to increase independence and safety with ADLS to allow discharge HHOT with BSC.      If plan is discharge home, recommend the following:   Two people to help with walking and/or transfers;A lot of help with bathing/dressing/bathroom     Functional Status Assessment   Patient has had a recent decline in their functional status and demonstrates the ability to make significant improvements in function in a reasonable and predictable amount of time.     Equipment Recommendations   BSC/3in1     Recommendations for Other Services         Precautions/Restrictions   Precautions Precautions: Fall Restrictions Weight Bearing Restrictions Per Provider Order: No     Mobility Bed Mobility Overal bed mobility: Needs Assistance Bed Mobility: Rolling, Supine to Sit Rolling: Min assist, Used rails   Supine to sit: Max assist, HOB elevated, Used rails     General bed mobility comments: pt  requires (A) for sequencing to progress to EOB. pt unable to bridge with L LE. pt requires pad to help progress to EOB    Transfers Overall transfer level: Needs assistance Equipment used: Rolling walker (2 wheels) Transfers: Sit to/from Stand Sit to Stand: Min assist, From elevated surface           General transfer comment: pt able to power up from elevated bed surface. Pt has lift chair at home      Balance Overall balance assessment: Needs assistance Sitting-balance support: Bilateral upper extremity supported, Feet supported Sitting balance-Leahy Scale: Fair     Standing balance support: Bilateral upper extremity supported, During functional activity, Reliant on assistive device for balance Standing balance-Leahy Scale: Poor                             ADL either performed or assessed with clinical judgement   ADL Overall ADL's : Needs assistance/impaired Eating/Feeding: Modified independent   Grooming: Wash/dry face;Oral care;Modified independent;Sitting               Lower Body Dressing: Maximal assistance Lower Body Dressing Details (indicate cue type and reason): will need education next session on LB dressing and use of AE Toilet Transfer: Moderate assistance;Stand-pivot;BSC/3in1;Rolling walker (2 wheels)   Toileting- Clothing Manipulation and Hygiene: Minimal assistance;Sit to/from stand Toileting - Clothing Manipulation Details (indicate cue type and reason): clean catch in hat on [facility] -  RN and tech notified       General ADL Comments: positioned in elevated chair surface to be OOB.     Vision Baseline Vision/History: 1 Wears glasses Patient Visual Report: No change from baseline Vision Assessment?: No apparent visual deficits     Perception         Praxis         Pertinent Vitals/Pain Pain Assessment Pain Assessment: 0-10 Pain Score: 3  Pain Location: R hip area and side of thigh Pain Descriptors / Indicators: Operative site  guarding Pain Intervention(s): Limited activity within patient's tolerance, Premedicated before session, Repositioned     Extremity/Trunk Assessment Upper Extremity Assessment Upper Extremity Assessment: Overall WFL for tasks assessed   Lower Extremity Assessment Lower Extremity Assessment: Defer to PT evaluation   Cervical / Trunk Assessment Cervical / Trunk Assessment: Normal   Communication Communication Communication: No apparent difficulties   Cognition Arousal: Alert Behavior During Therapy: WFL for tasks assessed/performed Cognition: No apparent impairments                               Following commands: Intact       Cueing  General Comments   Cueing Techniques: Verbal cues      Exercises     Shoulder Instructions      Home Living Family/patient expects to be discharged to:: Private residence Living Arrangements: Spouse/significant other   Type of Home: House Home Access: Stairs to enter Secretary/administrator of Steps: 5 Entrance Stairs-Rails: Right;Left;Can reach both Home Layout: One level     Bathroom Shower/Tub: Producer, television/film/video: Handicapped height     Home Equipment: Agricultural consultant (2 wheels);Cane - single point;Shower seat;Lift chair   Additional Comments: sleeps in lift chair, has 2 dogs named Radio producer      Prior Functioning/Environment Prior Level of Function : Independent/Modified Independent             Mobility Comments: typically uses SPC, used RW after the knee surg, in the house uses nothing, ADLs Comments: does grocery pick up and does not go into the store, does all house chores    OT Problem List: Decreased strength;Decreased activity tolerance;Impaired balance (sitting and/or standing);Decreased safety awareness;Decreased knowledge of use of DME or AE;Decreased knowledge of precautions;Obesity   OT Treatment/Interventions: Self-care/ADL training;Therapeutic exercise;DME and/or AE  instruction;Manual therapy;Modalities;Therapeutic activities;Patient/family education;Balance training      OT Goals(Current goals can be found in the care plan section)   Acute Rehab OT Goals Patient Stated Goal: to be able to get home at safe time and worried about the snow wednesday OT Goal Formulation: With patient Time For Goal Achievement: 06/27/23 Potential to Achieve Goals: Good   OT Frequency:  Min 1X/week    Co-evaluation              AM-PAC OT "6 Clicks" Daily Activity     Outcome Measure Help from another person eating meals?: None Help from another person taking care of personal grooming?: None Help from another person toileting, which includes using toliet, bedpan, or urinal?: A Little Help from another person bathing (including washing, rinsing, drying)?: A Little Help from another person to put on and taking off regular upper body clothing?: None Help from another person to put on and taking off regular lower body clothing?: A Lot 6 Click Score: 20   End of Session Equipment Utilized During Treatment: Gait belt;Rolling walker (2 wheels) Nurse  Communication: Mobility status;Precautions  Activity Tolerance: Patient tolerated treatment well Patient left: in chair;with call bell/phone within reach;with chair alarm set  OT Visit Diagnosis: Unsteadiness on feet (R26.81);Muscle weakness (generalized) (M62.81)                Time: 1610-9604 OT Time Calculation (min): 40 min Charges:  OT General Charges $OT Visit: 1 Visit OT Evaluation $OT Eval Moderate Complexity: 1 Mod OT Treatments $Self Care/Home Management : 23-37 mins   Brynn, OTR/L  Acute Rehabilitation Services Office: [phone] .   Mateo Flow 06/13/2023, 10:22 AM

## 2023-06-13 NOTE — Progress Notes (Signed)
Orthopaedic Trauma Progress Note  SUBJECTIVE: Doing ok this morning. Notes mild/moderate pain in the right hip and thigh this morning.  Was slightly discouraged overnight as she was not able to put as much weight if she thought to the leg.  We discussed this is normal postoperatively given her injury and surgery.  She denies any numbness or tingling throughout the right lower extremity.  No chest pain. No SOB. No nausea/vomiting. No other complaints.  Patient asking for powder for under her breast.  Notes that she frequently gets yeast in this area and has been using OTC athlete's foot powder to her breast folds daily.  Patient lives at home with her husband, is hopeful to discharge home and be able to avoid a SNF.  OBJECTIVE:  Vitals:   06/13/23 0449 06/13/23 0755  BP: 137/64 97/81  Pulse: 62 60  Resp: 18 16  Temp: 98 F (36.7 C) 98.7 F (37.1 C)  SpO2: 96% 95%    General: Sitting up in bed, no acute distress.  Pleasant and cooperative Respiratory: No increased work of breathing.  Right lower extremity: Dressings clean, dry, intact.  Some tenderness with palpation of the hip as expected.  No significant tenderness throughout the thigh or lower leg.  No calf tenderness.  Ankle dorsiflexion/plantarflexion intact.  Dorsal sensation light touch distally.  Neurovascularly intact.  IMAGING: Stable post op imaging.   LABS:  Results for orders placed or performed during the hospital encounter of 06/11/23 (from the past 24 hours)  Glucose, capillary     Status: Abnormal   Collection Time: 06/12/23 10:51 AM  Result Value Ref Range   Glucose-Capillary 122 (H) 70 - 99 mg/dL  Glucose, capillary     Status: Abnormal   Collection Time: 06/12/23 12:25 PM  Result Value Ref Range   Glucose-Capillary 140 (H) 70 - 99 mg/dL  Glucose, capillary     Status: Abnormal   Collection Time: 06/12/23  5:19 PM  Result Value Ref Range   Glucose-Capillary 333 (H) 70 - 99 mg/dL  VITAMIN D 25 Hydroxy (Vit-D  Deficiency, Fractures)     Status: Abnormal   Collection Time: 06/12/23  8:43 PM  Result Value Ref Range   Vit D, 25-Hydroxy 7.46 (L) 30 - 100 ng/mL  Glucose, capillary     Status: Abnormal   Collection Time: 06/12/23 10:35 PM  Result Value Ref Range   Glucose-Capillary 231 (H) 70 - 99 mg/dL  Protime-INR     Status: Abnormal   Collection Time: 06/13/23  5:52 AM  Result Value Ref Range   Prothrombin Time 19.9 (H) 11.4 - 15.2 seconds   INR 1.7 (H) 0.8 - 1.2  CBC     Status: Abnormal   Collection Time: 06/13/23  5:52 AM  Result Value Ref Range   WBC 17.0 (H) 4.0 - 10.5 K/uL   RBC 2.63 (L) 3.87 - 5.11 MIL/uL   Hemoglobin 7.9 (L) 12.0 - 15.0 g/dL   HCT 16.1 (L) 09.6 - 04.5 %   MCV 92.0 80.0 - 100.0 fL   MCH 30.0 26.0 - 34.0 pg   MCHC 32.6 30.0 - 36.0 g/dL   RDW 40.9 81.1 - 91.4 %   Platelets 213 150 - 400 K/uL   nRBC 0.0 0.0 - 0.2 %  Basic metabolic panel     Status: Abnormal   Collection Time: 06/13/23  5:52 AM  Result Value Ref Range   Sodium 134 (L) 135 - 145 mmol/L   Potassium 4.5 3.5 -  5.1 mmol/L   Chloride 100 98 - 111 mmol/L   CO2 23 22 - 32 mmol/L   Glucose, Bld 246 (H) 70 - 99 mg/dL   BUN 30 (H) 8 - 23 mg/dL   Creatinine, Ser 1.61 (H) 0.44 - 1.00 mg/dL   Calcium 8.8 (L) 8.9 - 10.3 mg/dL   GFR, Estimated 34 (L) >60 mL/min   Anion gap 11 5 - 15  Magnesium     Status: None   Collection Time: 06/13/23  5:52 AM  Result Value Ref Range   Magnesium 2.1 1.7 - 2.4 mg/dL  Glucose, capillary     Status: Abnormal   Collection Time: 06/13/23  7:48 AM  Result Value Ref Range   Glucose-Capillary 294 (H) 70 - 99 mg/dL    ASSESSMENT: [name] is a 73 y.o. female, 1 Day Post-Op s/p INTRAMEDULLARY NAIL RIGHT INTERTROCHANTERIC FEMUR FRACTURE  CV/Blood loss: Acute blood loss anemia, Hgb 7.9 this morning.. Hemodynamically stable  PLAN: Weightbearing: WBAT RLE ROM: Unrestricted ROM Incisional and dressing care: Reinforce dressings as needed  Showering: Okay to begin  showering getting incisions wet 06/15/2023 Orthopedic device(s): None  Pain management:  1. Tylenol 650 mg q 6 hours PRN 2. Robaxin 500 mg q 6 hours PRN 3. Oxycodone 5 mg q 4 hours PRN 4. Dilaudid 0.5 mg q 2 hours PRN VTE prophylaxis: Coumadin to be restarted per pharmacy, SCDs ID:  Ancef 2gm post op Foley/Lines:  No foley, KVO IVFs Impediments to Fracture Healing: Vitamin D level 7, started on supplementation Dispo: PT/OT evaluation today, dispo pending.  Continue to monitor CBC.  Okay for discharge from ortho standpoint once cleared by medicine team and therapies  D/C recommendations: -Oxycodone and Robaxin for pain control -Home dose Coumadin for DVT prophylaxis -Continue 50,000 units Vit D supplementation weekly x 8 weeks  Follow - up plan: 2 weeks for wound check and repeat x-rays   Contact information:  Truitt Merle MD, Thyra Breed PA-C. After hours and holidays please check [email or web address] for group call information for [facility]   Thompson Caul, PA-C [phone] (office) [email or web address]

## 2023-06-13 NOTE — Evaluation (Signed)
Physical Therapy Evaluation  Patient Details Name: [name] MRN: [id number] DOB: [birth year] Today's Date: 06/13/2023  History of Present Illness  72 yo female fall at home s/p 2/16 R IMN femur PMH heart block with pacemaker, atrial fibrillation, aortic stenosis s/p AVR, hypertrophic cardiomyopathy, HTN DM2 R TKA, R rotator cuff repair and obesity   Clinical Impression  Pt admitted with above diagnosis. Pt currently with functional limitations due to the deficits listed below (see PT Problem List). At the time of PT eval pt required increased assist for basic mobility and transfers. She had just transferred back to chair from [facility] with NT prior to session, and was unable to achieve full standing after due to fatigue. Pt reports she has not been able to lift up foot and advance it when transferring since surgery. She states she does not want to hurt her husband at home by pulling on him, and that he is "old and frail". Pt states she was told she could go to "in house therapy" at d/c. Explained there are several options for follow up rehab. After session, discussed with CSW regarding recommendation for continued therapies <3 hours/day. Pt will benefit from acute skilled PT to increase their independence and safety with mobility to allow discharge.           If plan is discharge home, recommend the following: A lot of help with bathing/dressing/bathroom;Two people to help with walking and/or transfers;Assistance with cooking/housework;Assist for transportation;Help with stairs or ramp for entrance   Can travel by private vehicle   No    Equipment Recommendations None recommended by PT  Recommendations for Other Services       Functional Status Assessment Patient has had a recent decline in their functional status and demonstrates the ability to make significant improvements in function in a reasonable and predictable amount of time.     Precautions / Restrictions Precautions Precautions:  Fall Recall of Precautions/Restrictions: Intact Restrictions Weight Bearing Restrictions Per Provider Order: No      Mobility  Bed Mobility               General bed mobility comments: Pt was received sitting up in the recliner.    Transfers Overall transfer level: Needs assistance Equipment used: Rolling walker (2 wheels) Transfers: Sit to/from Stand Sit to Stand: Max assist           General transfer comment: Multiple attempts at sit>stand from recliner chair. Pt unable to power up to full stand, and unable to keep COM over BOS. Pt pushing backwards on chair armrests and although chair locked, it was sliding backwards under the force. Did not attempt further due to safety.    Ambulation/Gait               General Gait Details: Unable at this time.  Stairs            Wheelchair Mobility     Tilt Bed    Modified Rankin (Stroke Patients Only)       Balance Overall balance assessment: Needs assistance Sitting-balance support: Bilateral upper extremity supported, Feet supported Sitting balance-Leahy Scale: Fair     Standing balance support: Bilateral upper extremity supported, During functional activity, Reliant on assistive device for balance Standing balance-Leahy Scale: Poor                               Pertinent Vitals/Pain Pain Assessment Pain Assessment: Faces Faces Pain  Scale: Hurts even more Pain Location: R hip area and side of thigh Pain Descriptors / Indicators: Operative site guarding Pain Intervention(s): Limited activity within patient's tolerance, Monitored during session, Repositioned    Home Living Family/patient expects to be discharged to:: Private residence Living Arrangements: Spouse/significant other Available Help at Discharge: Family;Available 24 hours/day Type of Home: House Home Access: Stairs to enter Entrance Stairs-Rails: Right;Left;Can reach both Entrance Stairs-Number of Steps: 5   Home  Layout: One level Home Equipment: Agricultural consultant (2 wheels);Cane - single point;Shower seat;Lift chair Additional Comments: sleeps in lift chair, has 2 dogs named Radio producer    Prior Function Prior Level of Function : Independent/Modified Independent             Mobility Comments: typically uses SPC, used RW after the knee surg, in the house uses nothing, ADLs Comments: does grocery pick up and does not go into the store, does all house chores     Extremity/Trunk Assessment   Upper Extremity Assessment Upper Extremity Assessment: Defer to OT evaluation    Lower Extremity Assessment Lower Extremity Assessment: RLE deficits/detail RLE Deficits / Details: Acute pain, decreased strength and AROM consistent with above mentioned injury and surgery. RLE: Unable to fully assess due to pain    Cervical / Trunk Assessment Cervical / Trunk Assessment: Normal  Communication   Communication Communication: No apparent difficulties    Cognition Arousal: Alert Behavior During Therapy: WFL for tasks assessed/performed   PT - Cognitive impairments: No apparent impairments                         Following commands: Intact       Cueing Cueing Techniques: Verbal cues     General Comments General comments (skin integrity, edema, etc.): Pt reports her husband is frail and she does not want to pull on him at home and hurt him.    Exercises     Assessment/Plan    PT Assessment Patient needs continued PT services  PT Problem List Decreased strength;Decreased range of motion;Decreased activity tolerance;Decreased balance;Decreased mobility;Decreased knowledge of use of DME;Decreased safety awareness;Decreased knowledge of precautions;Pain       PT Treatment Interventions DME instruction;Gait training;Stair training;Functional mobility training;Therapeutic activities;Therapeutic exercise;Balance training;Patient/family education    PT Goals (Current goals can be found in  the Care Plan section)  Acute Rehab PT Goals Patient Stated Goal: Be able to return home with her husband PT Goal Formulation: With patient Time For Goal Achievement: 06/27/23 Potential to Achieve Goals: Good    Frequency Min 1X/week     Co-evaluation               AM-PAC PT "6 Clicks" Mobility  Outcome Measure Help needed turning from your back to your side while in a flat bed without using bedrails?: A Lot Help needed moving from lying on your back to sitting on the side of a flat bed without using bedrails?: A Lot Help needed moving to and from a bed to a chair (including a wheelchair)?: Total Help needed standing up from a chair using your arms (e.g., wheelchair or bedside chair)?: Total Help needed to walk in hospital room?: Total Help needed climbing 3-5 steps with a railing? : Total 6 Click Score: 8    End of Session Equipment Utilized During Treatment: Gait belt Activity Tolerance: Patient limited by fatigue Patient left: in chair;with call bell/phone within reach;with chair alarm set Nurse Communication: Mobility status PT Visit Diagnosis: Unsteadiness  on feet (R26.81);Pain Pain - Right/Left: Right Pain - part of body: Hip    Time: 6010-9323 PT Time Calculation (min) (ACUTE ONLY): 15 min   Charges:   PT Evaluation $PT Eval Moderate Complexity: 1 Mod   PT General Charges $$ ACUTE PT VISIT: 1 Visit         Conni Slipper, PT, DPT Acute Rehabilitation Services Secure Chat Preferred Office: [phone]   [name] 06/13/2023, 4:27 PM

## 2023-06-13 NOTE — Progress Notes (Signed)
PHARMACY - ANTICOAGULATION CONSULT NOTE  Pharmacy Consult for warfarin Indication: atrial fibrillation  Allergies  Allergen Reactions   Nystatin Other (See Comments)    unknown   Other Other (See Comments)    Product Containing 3-Hydroxy-3-Methylglutaryl-Coenzyme A Reductase Inhibitor (Product) unknown   Statins Other (See Comments)    Muscles aches,hurt    Patient Measurements: Height: 5\' 8"  (172.7 cm) Weight: 106.6 kg (235 lb) IBW/kg (Calculated) : 63.9  Vital Signs: Temp: 98.7 F (37.1 C) (02/17 0755) Temp Source: Oral (02/17 0755) BP: 97/81 (02/17 0755) Pulse Rate: 60 (02/17 0755)  Labs: Recent Labs    06/11/23 1410 06/11/23 1411 06/12/23 0811 06/13/23 0552  HGB 11.3* 12.2 10.7* 7.9*  HCT 35.9* 36.0 32.5* 24.2*  PLT 225  --  270 213  LABPROT 30.0*  --  23.0* 19.9*  INR 2.8*  --  2.0* 1.7*  CREATININE 1.32* 1.50* 1.35* 1.58*    Estimated Creatinine Clearance: 39.3 mL/min (A) (by C-G formula based on SCr of 1.58 mg/dL (H)).   Medical History: Past Medical History:  Diagnosis Date   Aortic stenosis    21 mm Inspiris Edwards AVR June 2024 - [facility]   Arthritis    Cervical incompetence    GERD (gastroesophageal reflux disease)    Hay fever    Heart block    [employer] DCPPM on 10/26/2022 with Dr. Zachery Conch - [facility]   Hemochromatosis    HOCM (hypertrophic obstructive cardiomyopathy) [facility])    Septal myomectomy June 2024 - [facility]   Hypertension    Migraines    Mitral stenosis    25 mm [employer] mechanical MVR June 2024 - [facility]   Paroxysmal atrial fibrillation (HCC)    Sciatica    Type 2 diabetes mellitus (HCC)     Assessment: 38 YOF admitted for mechanical fall. Patient on warfarin PTA for atrial fibrillation. Last dose of warfarin received prior to admission on 2/15 and has been held since. S/p vitamin K 1mg  IV on 2/15 prior to surgery. Patient underwent surgery for right femur fracture 2/16. Pharmacy consulted to dose warfarin on POD#1 without bridging  therapy.   PTA regimen: 1.5 mg daily on Sat/Tues and 3 mg daily for all other days  INR today subtherapeutic at 1.7. Hgb 10.7 >> 7.9 post-operatively. Plts 200s.  Goal of Therapy:  INR 2-3 Monitor platelets by anticoagulation protocol: Yes   Plan:  Warfarin 4mg  PO x1 today  Monitor daily INR and CBC Continue to monitor H&H    Rexford Maus, PharmD, BCPS 06/13/2023 9:08 AM

## 2023-06-13 NOTE — TOC Initial Note (Signed)
Transition of Care [facility]) - Initial/Assessment Note    Patient Details  Name: [name] MRN: [id number] Date of Birth: [birth year]  Transition of Care [facility]) CM/SW Contact:    Harriet Masson, RN Phone Number: 06/13/2023, 4:21 PM  Clinical Narrative:     Spoke to patient regarding transition needs. Patient lives with husband and has walker.          Patient requesting BSC. Will order closer to discharge.  This RNCM offered choice for Home  Health, Patient states she has no preference, RNCM made referral to [facility] with Frances Furbish, He is able to take referral.     Need home health PT, OT orders. Address, Phone number and PCP verified. TOC following.   Expected Discharge Plan: Home w Home Health Services Barriers to Discharge: Continued Medical Work up   Patient Goals and CMS Choice Patient states their goals for this hospitalization and ongoing recovery are:: return home with husband CMS Medicare.gov Compare Post Acute Care list provided to:: Patient Choice offered to / list presented to : Patient      Expected Discharge Plan and Services   Discharge Planning Services: CM Consult Post Acute Care Choice: Durable Medical Equipment, Home Health Living arrangements for the past 2 months: Single Family Home                           HH Arranged: PT, OT HH Agency: [facility] Date [facility] Agency Contacted: 06/13/23 Time HH Agency Contacted: 1621 Representative spoke with at [facility] Agency: Kandee Keen  Prior Living Arrangements/Services Living arrangements for the past 2 months: Single Family Home Lives with:: Spouse Patient language and need for interpreter reviewed:: Yes Do you feel safe going back to the place where you live?: Yes      Need for Family Participation in Patient Care: Yes (Comment) Care giver support system in place?: Yes (comment) Current home services: DME (walker, cane) Criminal Activity/Legal Involvement Pertinent to Current Situation/Hospitalization: No -  Comment as needed  Activities of Daily Living   ADL Screening (condition at time of admission) Independently performs ADLs?: No Does the patient have a NEW difficulty with bathing/dressing/toileting/self-feeding that is expected to last >3 days?: Yes (Initiates electronic notice to provider for possible OT consult) Does the patient have a NEW difficulty with getting in/out of bed, walking, or climbing stairs that is expected to last >3 days?: Yes (Initiates electronic notice to provider for possible PT consult) Does the patient have a NEW difficulty with communication that is expected to last >3 days?: No Is the patient deaf or have difficulty hearing?: No Does the patient have difficulty seeing, even when wearing glasses/contacts?: No Does the patient have difficulty concentrating, remembering, or making decisions?: No  Permission Sought/Granted Permission sought to share information with : Photographer granted to share info w AGENCY: [facility]        Emotional Assessment   Attitude/Demeanor/Rapport: Engaged Affect (typically observed): Accepting Orientation: : Oriented to Self, Oriented to Place, Oriented to  Time, Oriented to Situation Alcohol / Substance Use: Not Applicable Psych Involvement: No (comment)  Admission diagnosis:  Hip fracture (HCC) [S72.009A] Closed right hip fracture (HCC) [S72.001A] Closed fracture of right hip, initial encounter [facility]) [S72.001A] Patient Active Problem List   Diagnosis Date Noted   Closed right femoral fracture (HCC) 06/11/2023   Closed right hip fracture (HCC) 06/11/2023   Permanent atrial fibrillation (HCC) 06/11/2023  Type 2 diabetes mellitus with hyperlipidemia (HCC) 06/11/2023   Obesity, class 2 06/11/2023   CKD stage 3b, GFR 30-44 ml/min (HCC) 06/11/2023   Atrial flutter with rapid ventricular response (HCC) 01/31/2023   Atrial fibrillation with RVR (HCC) 01/31/2023   Mitral valve replaced 11/03/2022    S/P AVR (aortic valve replacement) 11/03/2022   Encounter for therapeutic drug monitoring 11/03/2022   S/P total knee arthroplasty, right 10/13/2021   Hypertrophic cardiomyopathy (HCC) 06/07/2021   Other hemochromatosis 01/30/2021   Severe aortic stenosis 07/28/2020   Moderate aortic stenosis 07/28/2020   BMI 34.0-34.9,adult 06/06/2019   Incomplete uterovaginal prolapse 11/10/2017   Essential hypertension 02/16/2016   Sciatic leg pain 02/16/2016   PCP:  Assunta Found, MD Pharmacy:   [facility] [phone] - [place], [place] - [address] AT [facility] [address] [place] [address] Phone: [phone] Fax: [phone]     Social Drivers of Health (SDOH) Social History: SDOH Screenings   Food Insecurity: No Food Insecurity (06/12/2023)  Housing: Unknown (06/12/2023)  Transportation Needs: No Transportation Needs (06/12/2023)  Utilities: Not At Risk (06/12/2023)  Depression (PHQ2-9): Low Risk  (04/18/2023)  Social Connections: Socially Integrated (06/12/2023)  Tobacco Use: Medium Risk (06/12/2023)   SDOH Interventions:     Readmission Risk Interventions     No data to display

## 2023-06-13 NOTE — Progress Notes (Addendum)
[name]  WUJ:[id number] DOB: 30-Nov-1950 DOA: 06/11/2023 PCP: Assunta Found, MD    Brief Narrative:  73 year old with a history of heart block status post pacemaker placement, atrial fibrillation, aortic stenosis status post aortic valve replacement, hypertrophic cardiomyopathy, HTN, DM2, and obesity who presented to [facility] after suffering a mechanical fall at home followed by severe pain of the right hip and the inability to stand.  Imaging in the ER revealed a R comminuted intertrochanteric femur fracture.  Goals of Care:   Code Status: Full Code   DVT prophylaxis: SCDs Start: 06/12/23 1909 SCDs Start: 06/12/23 1313warfarin  Interim Hx: No acute events reported overnight.  Afebrile.  Vital signs stable.  Hemoglobin noted to have dropped postoperatively.  Resting comfortably in a bedside chair.  Reports that she did well with therapy today.  No chest pain or shortness of breath.  States pain is reasonably well-controlled.  Assessment & Plan:  Closed right intertrochanteric femur fracture Management per Orthopedic Surgery - to OR 2/16 - care as follows:  Weightbearing: WBAT RLE ROM: Unrestricted ROM Incisional and dressing care: Reinforce dressings as needed  Showering: Okay to begin showering getting incisions wet 06/15/2023 VTE prophylaxis: Coumadin to be restarted per pharmacy, SCDs Impediments to Fracture Healing: Vitamin D level 7, started on supplementation -Continue 50,000 units Vit D supplementation weekly x 8 weeks Follow - up plan: 2 weeks for wound check and repeat x-rays   Anemia of operative blood loss Not yet at transfusion threshold - monitor hemoglobin trend  HTN Continue usual metoprolol -hold losartan given mild postoperative hypotension and slight increase in creatinine  Permanent atrial fibrillation on warfarin therapy Continue usual amiodarone and metoprolol -  resume warfarin now that patient post-op  CKD stage IIIb Baseline creatinine  approximately 1.5 -creatinine increases slightly today - stop ARB and follow  Recent Labs  Lab 06/11/23 1410 06/11/23 1411 06/12/23 0811 06/13/23 0552  CREATININE 1.32* 1.50* 1.35* 1.58*     DM2 w/ hyperglycemia  CBG elevated above goal - adjust treatment and follow  Vitamin D deficiency Vitamin D level quite low at 7.46 -supplementation added  Obesity - Body mass index is 35.73 kg/m.   Family Communication: No family present at time of exam Disposition: Monitor hemoglobin and CBC -monitor progress with PT/OT -hopeful for discharge home, possibly as early as 2/18   Objective: Blood pressure 97/81, pulse 60, temperature 98.7 F (37.1 C), temperature source Oral, resp. rate 16, height 5\' 8"  (1.727 m), weight 106.6 kg, last menstrual period 04/26/1998, SpO2 95%.  Intake/Output Summary (Last 24 hours) at 06/13/2023 1026 Last data filed at 06/13/2023 0616 Gross per 24 hour  Intake 1370 ml  Output 825 ml  Net 545 ml   Filed Weights   06/11/23 1333  Weight: 106.6 kg    Examination: General: No acute respiratory distress Lungs: Clear to auscultation bilaterally without wheezes or crackles Cardiovascular: Regular rate and rhythm without murmur gallop or rub normal S1 and S2 Abdomen: Nontender, nondistended, soft, bowel sounds positive, no rebound, no ascites, no appreciable mass Extremities: No significant cyanosis, clubbing, or edema bilateral lower extremities  CBC: Recent Labs  Lab 06/11/23 1410 06/11/23 1411 06/12/23 0811 06/13/23 0552  WBC 11.8*  --  17.4* 17.0*  NEUTROABS 9.9*  --   --   --   HGB 11.3* 12.2 10.7* 7.9*  HCT 35.9* 36.0 32.5* 24.2*  MCV 94.2  --  91.5 92.0  PLT 225  --  270 213   Basic  Metabolic Panel: Recent Labs  Lab 06/11/23 1410 06/11/23 1411 06/12/23 0811 06/13/23 0552  NA 136 137 134* 134*  K 4.6 4.7 4.7 4.5  CL 103 104 101 100  CO2 22  --  23 23  GLUCOSE 165* 159* 153* 246*  BUN 27* 26* 26* 30*  CREATININE 1.32* 1.50* 1.35*  1.58*  CALCIUM 8.7*  --  8.8* 8.8*  MG  --   --   --  2.1   GFR: Estimated Creatinine Clearance: 39.3 mL/min (A) (by C-G formula based on SCr of 1.58 mg/dL (H)).   Scheduled Meds:  amiodarone  200 mg Oral Daily   docusate sodium  100 mg Oral BID   ezetimibe  10 mg Oral Daily   insulin aspart  0-9 Units Subcutaneous TID WC   losartan  25 mg Oral Daily   magnesium oxide  400 mg Oral Daily   metoprolol succinate  25 mg Oral Daily   pantoprazole  40 mg Oral Daily   Warfarin - Pharmacist Dosing Inpatient   Does not apply q1600   Continuous Infusions:   ceFAZolin (ANCEF) IV 2 g (06/13/23 0454)     LOS: 2 days   Lonia Blood, MD [facility] Office  [phone] Pager - Text Page per Loretha Stapler  If 7PM-7AM, please contact night-coverage per Amion 06/13/2023, 10:26 AM

## 2023-06-13 NOTE — Plan of Care (Signed)
  Problem: Clinical Measurements: Goal: Ability to maintain clinical measurements within normal limits will improve Outcome: Progressing   Problem: Activity: Goal: Risk for activity intolerance will decrease Outcome: Progressing   Problem: Nutrition: Goal: Adequate nutrition will be maintained Outcome: Progressing   Problem: Elimination: Goal: Will not experience complications related to urinary retention Outcome: Progressing

## 2023-06-13 NOTE — Plan of Care (Signed)
  Problem: Education: Goal: Knowledge of General Education information will improve Description: Including pain rating scale, medication(s)/side effects and non-pharmacologic comfort measures Outcome: Progressing   Problem: Health Behavior/Discharge Planning: Goal: Ability to manage health-related needs will improve Outcome: Progressing   Problem: Clinical Measurements: Goal: Ability to maintain clinical measurements within normal limits will improve Outcome: Progressing Goal: Will remain free from infection Outcome: Progressing Goal: Diagnostic test results will improve Outcome: Progressing Goal: Respiratory complications will improve Outcome: Progressing Goal: Cardiovascular complication will be avoided Outcome: Progressing   Problem: Activity: Goal: Risk for activity intolerance will decrease Outcome: Progressing   Problem: Nutrition: Goal: Adequate nutrition will be maintained Outcome: Progressing   Problem: Coping: Goal: Level of anxiety will decrease Outcome: Progressing   Problem: Elimination: Goal: Will not experience complications related to bowel motility Outcome: Progressing Goal: Will not experience complications related to urinary retention Outcome: Progressing   Problem: Pain Managment: Goal: General experience of comfort will improve and/or be controlled Outcome: Progressing   Problem: Safety: Goal: Ability to remain free from injury will improve Outcome: Progressing   Problem: Skin Integrity: Goal: Risk for impaired skin integrity will decrease Outcome: Progressing   Problem: Education: Goal: Knowledge of General Education information will improve Description: Including pain rating scale, medication(s)/side effects and non-pharmacologic comfort measures Outcome: Progressing   Problem: Health Behavior/Discharge Planning: Goal: Ability to manage health-related needs will improve Outcome: Progressing   Problem: Clinical Measurements: Goal:  Ability to maintain clinical measurements within normal limits will improve Outcome: Progressing Goal: Will remain free from infection Outcome: Progressing Goal: Diagnostic test results will improve Outcome: Progressing Goal: Respiratory complications will improve Outcome: Progressing Goal: Cardiovascular complication will be avoided Outcome: Progressing   Problem: Activity: Goal: Risk for activity intolerance will decrease Outcome: Progressing   Problem: Nutrition: Goal: Adequate nutrition will be maintained Outcome: Progressing   Problem: Coping: Goal: Level of anxiety will decrease Outcome: Progressing   Problem: Elimination: Goal: Will not experience complications related to bowel motility Outcome: Progressing Goal: Will not experience complications related to urinary retention Outcome: Progressing   Problem: Pain Managment: Goal: General experience of comfort will improve and/or be controlled Outcome: Progressing   Problem: Safety: Goal: Ability to remain free from injury will improve Outcome: Progressing   Problem: Skin Integrity: Goal: Risk for impaired skin integrity will decrease Outcome: Progressing   Problem: Education: Goal: Ability to describe self-care measures that may prevent or decrease complications (Diabetes Survival Skills Education) will improve Outcome: Progressing Goal: Individualized Educational Video(s) Outcome: Progressing   Problem: Coping: Goal: Ability to adjust to condition or change in health will improve Outcome: Progressing   Problem: Fluid Volume: Goal: Ability to maintain a balanced intake and output will improve Outcome: Progressing   Problem: Health Behavior/Discharge Planning: Goal: Ability to identify and utilize available resources and services will improve Outcome: Progressing Goal: Ability to manage health-related needs will improve Outcome: Progressing   Problem: Metabolic: Goal: Ability to maintain appropriate  glucose levels will improve Outcome: Progressing   Problem: Nutritional: Goal: Maintenance of adequate nutrition will improve Outcome: Progressing Goal: Progress toward achieving an optimal weight will improve Outcome: Progressing   Problem: Skin Integrity: Goal: Risk for impaired skin integrity will decrease Outcome: Progressing   Problem: Tissue Perfusion: Goal: Adequacy of tissue perfusion will improve Outcome: Progressing

## 2023-06-14 ENCOUNTER — Encounter (HOSPITAL_COMMUNITY): Payer: Medicare Other

## 2023-06-14 DIAGNOSIS — S72101D Unspecified trochanteric fracture of right femur, subsequent encounter for closed fracture with routine healing: Secondary | ICD-10-CM | POA: Diagnosis not present

## 2023-06-14 LAB — CBC
HCT: 23.5 % — ABNORMAL LOW (ref 36.0–46.0)
Hemoglobin: 7.6 g/dL — ABNORMAL LOW (ref 12.0–15.0)
MCH: 30 pg (ref 26.0–34.0)
MCHC: 32.3 g/dL (ref 30.0–36.0)
MCV: 92.9 fL (ref 80.0–100.0)
Platelets: 221 10*3/uL (ref 150–400)
RBC: 2.53 MIL/uL — ABNORMAL LOW (ref 3.87–5.11)
RDW: 14 % (ref 11.5–15.5)
WBC: 14.4 10*3/uL — ABNORMAL HIGH (ref 4.0–10.5)
nRBC: 0 % (ref 0.0–0.2)

## 2023-06-14 LAB — BASIC METABOLIC PANEL
Anion gap: 11 (ref 5–15)
BUN: 39 mg/dL — ABNORMAL HIGH (ref 8–23)
CO2: 24 mmol/L (ref 22–32)
Calcium: 8.6 mg/dL — ABNORMAL LOW (ref 8.9–10.3)
Chloride: 100 mmol/L (ref 98–111)
Creatinine, Ser: 1.73 mg/dL — ABNORMAL HIGH (ref 0.44–1.00)
GFR, Estimated: 30 mL/min — ABNORMAL LOW (ref >60)
Glucose, Bld: 165 mg/dL — ABNORMAL HIGH (ref 70–99)
Potassium: 4.5 mmol/L (ref 3.5–5.1)
Sodium: 135 mmol/L (ref 135–145)

## 2023-06-14 LAB — PROTIME-INR
INR: 1.8 — ABNORMAL HIGH (ref 0.8–1.2)
Prothrombin Time: 20.8 s — ABNORMAL HIGH (ref 11.4–15.2)

## 2023-06-14 LAB — GLUCOSE, CAPILLARY
Glucose-Capillary: 152 mg/dL — ABNORMAL HIGH (ref 70–99)
Glucose-Capillary: 165 mg/dL — ABNORMAL HIGH (ref 70–99)
Glucose-Capillary: 169 mg/dL — ABNORMAL HIGH (ref 70–99)
Glucose-Capillary: 259 mg/dL — ABNORMAL HIGH (ref 70–99)

## 2023-06-14 MED ORDER — WARFARIN SODIUM 1 MG PO TABS
1.5000 mg | ORAL_TABLET | Freq: Once | ORAL | Status: AC
  Administered 2023-06-14: 1.5 mg via ORAL
  Filled 2023-06-14 (×2): qty 1

## 2023-06-14 MED ORDER — OXYCODONE HCL 5 MG PO TABS
5.0000 mg | ORAL_TABLET | ORAL | Status: DC | PRN
  Administered 2023-06-14 – 2023-06-17 (×11): 5 mg via ORAL
  Filled 2023-06-14 (×11): qty 1

## 2023-06-14 NOTE — Plan of Care (Signed)
  Problem: Education: Goal: Knowledge of General Education information will improve Description: Including pain rating scale, medication(s)/side effects and non-pharmacologic comfort measures Outcome: Progressing   Problem: Health Behavior/Discharge Planning: Goal: Ability to manage health-related needs will improve Outcome: Progressing   Problem: Clinical Measurements: Goal: Ability to maintain clinical measurements within normal limits will improve Outcome: Progressing Goal: Will remain free from infection Outcome: Progressing Goal: Diagnostic test results will improve Outcome: Progressing Goal: Respiratory complications will improve Outcome: Progressing Goal: Cardiovascular complication will be avoided Outcome: Progressing   Problem: Activity: Goal: Risk for activity intolerance will decrease Outcome: Progressing   Problem: Nutrition: Goal: Adequate nutrition will be maintained Outcome: Progressing   Problem: Coping: Goal: Level of anxiety will decrease Outcome: Progressing   Problem: Elimination: Goal: Will not experience complications related to bowel motility Outcome: Progressing Goal: Will not experience complications related to urinary retention Outcome: Progressing   Problem: Pain Managment: Goal: General experience of comfort will improve and/or be controlled Outcome: Progressing   Problem: Safety: Goal: Ability to remain free from injury will improve Outcome: Progressing   Problem: Skin Integrity: Goal: Risk for impaired skin integrity will decrease Outcome: Progressing   Problem: Education: Goal: Knowledge of General Education information will improve Description: Including pain rating scale, medication(s)/side effects and non-pharmacologic comfort measures Outcome: Progressing   Problem: Health Behavior/Discharge Planning: Goal: Ability to manage health-related needs will improve Outcome: Progressing   Problem: Clinical Measurements: Goal:  Ability to maintain clinical measurements within normal limits will improve Outcome: Progressing Goal: Will remain free from infection Outcome: Progressing Goal: Diagnostic test results will improve Outcome: Progressing Goal: Respiratory complications will improve Outcome: Progressing Goal: Cardiovascular complication will be avoided Outcome: Progressing   Problem: Activity: Goal: Risk for activity intolerance will decrease Outcome: Progressing   Problem: Nutrition: Goal: Adequate nutrition will be maintained Outcome: Progressing   Problem: Coping: Goal: Level of anxiety will decrease Outcome: Progressing   Problem: Elimination: Goal: Will not experience complications related to bowel motility Outcome: Progressing Goal: Will not experience complications related to urinary retention Outcome: Progressing   Problem: Pain Managment: Goal: General experience of comfort will improve and/or be controlled Outcome: Progressing   Problem: Safety: Goal: Ability to remain free from injury will improve Outcome: Progressing   Problem: Skin Integrity: Goal: Risk for impaired skin integrity will decrease Outcome: Progressing   Problem: Education: Goal: Ability to describe self-care measures that may prevent or decrease complications (Diabetes Survival Skills Education) will improve Outcome: Progressing Goal: Individualized Educational Video(s) Outcome: Progressing   Problem: Coping: Goal: Ability to adjust to condition or change in health will improve Outcome: Progressing   Problem: Fluid Volume: Goal: Ability to maintain a balanced intake and output will improve Outcome: Progressing   Problem: Health Behavior/Discharge Planning: Goal: Ability to identify and utilize available resources and services will improve Outcome: Progressing Goal: Ability to manage health-related needs will improve Outcome: Progressing   Problem: Metabolic: Goal: Ability to maintain appropriate  glucose levels will improve Outcome: Progressing   Problem: Nutritional: Goal: Maintenance of adequate nutrition will improve Outcome: Progressing Goal: Progress toward achieving an optimal weight will improve Outcome: Progressing   Problem: Skin Integrity: Goal: Risk for impaired skin integrity will decrease Outcome: Progressing   Problem: Tissue Perfusion: Goal: Adequacy of tissue perfusion will improve Outcome: Progressing

## 2023-06-14 NOTE — NC FL2 (Signed)
[place] MEDICAID FL2 LEVEL OF CARE FORM     IDENTIFICATION  Patient Name: [name] Birthdate: 10-04-1950 Sex: female Admission Date (Current Location): 06/11/2023  [facility] and [place] Number:  Producer, television/film/video and Address:  [facility]. [facility], [address], [place], [place] [address]      Provider Number: [id number]  Attending Physician Name and Address:  Lonia Blood, MD  Relative Name and Phone Number:       Current Level of Care: Hospital Recommended Level of Care: Skilled Nursing Facility Prior Approval Number:    Date Approved/Denied:   PASRR Number: [id number]  Discharge Plan: SNF    Current Diagnoses: Patient Active Problem List   Diagnosis Date Noted   Closed right femoral fracture (HCC) 06/11/2023   Closed right hip fracture (HCC) 06/11/2023   Permanent atrial fibrillation (HCC) 06/11/2023   Type 2 diabetes mellitus with hyperlipidemia (HCC) 06/11/2023   Obesity, class 2 06/11/2023   CKD stage 3b, GFR 30-44 ml/min (HCC) 06/11/2023   Atrial flutter with rapid ventricular response (HCC) 01/31/2023   Atrial fibrillation with RVR (HCC) 01/31/2023   Mitral valve replaced 11/03/2022   S/P AVR (aortic valve replacement) 11/03/2022   Encounter for therapeutic drug monitoring 11/03/2022   S/P total knee arthroplasty, right 10/13/2021   Hypertrophic cardiomyopathy (HCC) 06/07/2021   Other hemochromatosis 01/30/2021   Severe aortic stenosis 07/28/2020   Moderate aortic stenosis 07/28/2020   BMI 34.0-34.9,adult 06/06/2019   Incomplete uterovaginal prolapse 11/10/2017   Essential hypertension 02/16/2016   Sciatic leg pain 02/16/2016    Orientation RESPIRATION BLADDER Height & Weight     Self, Time, Situation, Place  Normal Continent Weight: 235 lb (106.6 kg) Height:  5\' 8"  (172.7 cm)  BEHAVIORAL SYMPTOMS/MOOD NEUROLOGICAL BOWEL NUTRITION STATUS      Continent Diet (See DC summary)  AMBULATORY STATUS COMMUNICATION OF NEEDS  Skin   Extensive Assist Verbally Surgical wounds (R hip inc)                       Personal Care Assistance Level of Assistance  Bathing, Feeding, Dressing Bathing Assistance: Maximum assistance Feeding assistance: Limited assistance Dressing Assistance: Maximum assistance     Functional Limitations Info  Sight, Hearing, Speech Sight Info: Adequate Hearing Info: Adequate Speech Info: Adequate    SPECIAL CARE FACTORS FREQUENCY  PT (By licensed PT), OT (By licensed OT)     PT Frequency: 5x week OT Frequency: 5x week            Contractures Contractures Info: Not present    Additional Factors Info  Code Status, Allergies, Insulin Sliding Scale Code Status Info: Full Allergies Info: Nystatin, Statins,  Product Containing 3-Hydroxy-3-Methylglutaryl-Coenzyme A Reductase Inhibitor   Insulin Sliding Scale Info: See DC summary       Current Medications (06/14/2023):  This is the current hospital active medication list Current Facility-Administered Medications  Medication Dose Route Frequency Provider Last Rate Last Admin   acetaminophen (TYLENOL) tablet 650 mg  650 mg Oral Q6H PRN Arrien, York Ram, MD   650 mg at 06/14/23 0957   amiodarone (PACERONE) tablet 200 mg  200 mg Oral Daily Coralie Keens, MD   200 mg at 06/14/23 5621   docusate sodium (COLACE) capsule 100 mg  100 mg Oral BID [place] Bali, PA-C   100 mg at 06/14/23 3086   ezetimibe (ZETIA) tablet 10 mg  10 mg Oral Daily Arrien, York Ram, MD  10 mg at 06/14/23 0953   insulin aspart (novoLOG) injection 0-9 Units  0-9 Units Subcutaneous TID WC Arrien, York Ram, MD   2 Units at 06/14/23 0807   insulin glargine-yfgn [facility]) injection 12 Units  12 Units Subcutaneous Daily Lonia Blood, MD   12 Units at 06/14/23 8657   magnesium oxide (MAG-OX) tablet 400 mg  400 mg Oral Daily Coralie Keens, MD   400 mg at 06/14/23 8469   melatonin tablet 3 mg  3 mg Oral QHS PRN Arrien,  York Ram, MD   3 mg at 06/13/23 2043   methocarbamol (ROBAXIN) tablet 500 mg  500 mg Oral Q8H PRN West Bali, PA-C   500 mg at 06/14/23 6295   Or   methocarbamol (ROBAXIN) injection 500 mg  500 mg Intravenous Q8H PRN [place] Bali, PA-C       metoprolol succinate (TOPROL-XL) 24 hr tablet 25 mg  25 mg Oral Daily Jetty Duhamel T, MD   25 mg at 06/14/23 0953   ondansetron (ZOFRAN) tablet 4 mg  4 mg Oral Q6H PRN Arrien, York Ram, MD   4 mg at 06/11/23 1726   Or   ondansetron (ZOFRAN) injection 4 mg  4 mg Intravenous Q6H PRN Arrien, York Ram, MD       oxyCODONE (Oxy IR/ROXICODONE) immediate release tablet 5 mg  5 mg Oral Q4H PRN Lonia Blood, MD       pantoprazole (PROTONIX) EC tablet 40 mg  40 mg Oral Daily Coralie Keens, MD   40 mg at 06/14/23 2841   polyethylene glycol (MIRALAX / GLYCOLAX) packet 17 g  17 g Oral Daily PRN West Bali, PA-C       tolnaftate (TINACTIN) 1 % powder   Topical BID PRN West Bali, PA-C       Vitamin D (Ergocalciferol) (DRISDOL) 1.25 MG (50000 UNIT) capsule 50,000 Units  50,000 Units Oral Q7 days Lonia Blood, MD   50,000 Units at 06/13/23 1241   warfarin (COUMADIN) tablet 1.5 mg  1.5 mg Oral ONCE-1600 Rexford Maus, [facility]       Warfarin - Pharmacist Dosing Inpatient   Does not apply q1600 Jerry Caras, [facility]         Discharge Medications: Please see discharge summary for a list of discharge medications.  Relevant Imaging Results:  Relevant Lab Results:   Additional Information SS# [id number] [place]

## 2023-06-14 NOTE — Care Management Important Message (Signed)
Important Message  Patient Details  Name: [name] MRN: [id number] Date of Birth: [birth year]   Important Message Given:  Yes - Medicare IM     Sherilyn Banker 06/14/2023, 3:07 PM

## 2023-06-14 NOTE — Progress Notes (Signed)
PHARMACY - ANTICOAGULATION CONSULT NOTE  Pharmacy Consult for warfarin Indication: atrial fibrillation  Allergies  Allergen Reactions   Nystatin Other (See Comments)    unknown   Other Other (See Comments)    Product Containing 3-Hydroxy-3-Methylglutaryl-Coenzyme A Reductase Inhibitor (Product) unknown   Statins Other (See Comments)    Muscles aches,hurt    Patient Measurements: Height: 5\' 8"  (172.7 cm) Weight: 106.6 kg (235 lb) IBW/kg (Calculated) : 63.9  Vital Signs: Temp: 98.2 F (36.8 C) (02/18 0739) Temp Source: Oral (02/18 0739) BP: 154/66 (02/18 0739) Pulse Rate: 60 (02/18 0739)  Labs: Recent Labs    06/12/23 0811 06/13/23 0552 06/13/23 1345 06/14/23 0644  HGB 10.7* 7.9* 7.7* 7.6*  HCT 32.5* 24.2* 24.0* 23.5*  PLT 270 213 227 221  LABPROT 23.0* 19.9*  --  20.8*  INR 2.0* 1.7*  --  1.8*  CREATININE 1.35* 1.58*  --  1.73*    Estimated Creatinine Clearance: 35.9 mL/min (A) (by C-G formula based on SCr of 1.73 mg/dL (H)).   Medical History: Past Medical History:  Diagnosis Date   Aortic stenosis    21 mm Inspiris Edwards AVR June 2024 - [facility]   Arthritis    Cervical incompetence    GERD (gastroesophageal reflux disease)    Hay fever    Heart block    [employer] DCPPM on 10/26/2022 with Dr. Zachery Conch - [facility]   Hemochromatosis    HOCM (hypertrophic obstructive cardiomyopathy) [facility])    Septal myomectomy June 2024 - [facility]   Hypertension    Migraines    Mitral stenosis    25 mm [employer] mechanical MVR June 2024 - [facility]   Paroxysmal atrial fibrillation (HCC)    Sciatica    Type 2 diabetes mellitus (HCC)     Assessment: 37 YOF admitted for mechanical fall. Patient on warfarin PTA for atrial fibrillation. Last dose of warfarin received prior to admission on 2/15 and has been held since. S/p vitamin K 1mg  IV on 2/15 prior to surgery. Patient underwent surgery for right femur fracture 2/16. Pharmacy consulted to dose warfarin on POD#1 without bridging  therapy.   PTA regimen: 1.5 mg daily on Sat/Tues and 3 mg daily for all other days  INR today subtherapeutic at 1.8 (gave slightly higher warfarin dose yesterday). Hgb 10.7 >> 7.6 post-operatively. Plts 200s.  Goal of Therapy:  INR 2-3 Monitor platelets by anticoagulation protocol: Yes   Plan:  Warfarin 1.5mg  PO x1 today - home dose Monitor daily INR and CBC Continue to monitor H&H    Rexford Maus, PharmD, BCPS 06/14/2023 9:02 AM

## 2023-06-14 NOTE — Progress Notes (Signed)
[name]  ZOX:[id number] DOB: 1950/08/05 DOA: 06/11/2023 PCP: Assunta Found, MD    Brief Narrative:  73 year old with a history of heart block status post pacemaker placement, atrial fibrillation, aortic stenosis status post aortic valve replacement, hypertrophic cardiomyopathy, HTN, DM2, and obesity who presented to the [facility] after suffering a mechanical fall at home followed by severe pain of the right hip and the inability to stand.  Imaging in the ER revealed a R comminuted intertrochanteric femur fracture.  Goals of Care:   Code Status: Full Code   DVT prophylaxis: SCDs Start: 06/12/23 1313warfarin warfarin (COUMADIN) tablet 1.5 mg   Interim Hx: Had significant difficulty ambulating to the bathroom last night requiring multiple staff members for assistance.  Afebrile.  Vital signs stable.  Hemoglobin has stabilized.  No new complaints today.  We discussed the advisability of a short SNF rehab stay and she now agrees that this is the most appropriate disposition.  Assessment & Plan:  Closed right intertrochanteric femur fracture Management per Orthopedic Surgery - to OR 2/16 - care as follows:  Weightbearing: WBAT RLE ROM: Unrestricted ROM Incisional and dressing care: Dressings were removed 2/18 -leave to open air -utilize Mepilex as needed Showering: Okay to begin showering getting incisions wet 06/15/2023 VTE prophylaxis: Coumadin to be restarted per pharmacy, SCDs Impediments to Fracture Healing: Vitamin D level 7, started on supplementation -Continue 50,000 units Vit D supplementation weekly x 8 weeks Follow - up plan: 2 weeks for wound check and repeat x-rays   Anemia of operative blood loss Not yet at transfusion threshold - appears hemoglobin is stabilizing at present  HTN Continue usual metoprolol - holding losartan given mild postoperative increasing creatinine  Permanent atrial fibrillation on warfarin therapy Continue usual amiodarone and metoprolol - continue  warfarin  CKD stage IIIb Baseline creatinine approximately 1.5 -creatinine on a slow upward trend - stopped ARB - cont to follow  Recent Labs  Lab 06/11/23 1410 06/11/23 1411 06/12/23 0811 06/13/23 0552 06/14/23 0644  CREATININE 1.32* 1.50* 1.35* 1.58* 1.73*     DM2 w/ hyperglycemia  CBG improved with adjustments made in therapy yesterday - continue to follow  Vitamin D deficiency Vitamin D level quite low at 7.46 - supplementation added  Obesity - Body mass index is 35.73 kg/m.   Family Communication: No family present at time of exam Disposition: Given her difficulties with mobility SNF rehab stay is recommended -will be medically ready for discharge 2/19 if creatinine has stabilized and hemoglobin is stable   Objective: Blood pressure (!) 154/66, pulse 60, temperature 98.2 F (36.8 C), temperature source Oral, resp. rate 17, height 5\' 8"  (1.727 m), weight 106.6 kg, last menstrual period 04/26/1998, SpO2 95%.  Intake/Output Summary (Last 24 hours) at 06/14/2023 1005 Last data filed at 06/13/2023 1500 Gross per 24 hour  Intake 350 ml  Output --  Net 350 ml   Filed Weights   06/11/23 1333  Weight: 106.6 kg    Examination: General: No acute respiratory distress Lungs: Clear to auscultation bilaterally without wheezes or crackles Cardiovascular: Regular rate and rhythm without murmur gallop or rub normal S1 and S2 Abdomen: Nontender, nondistended, soft, bowel sounds positive, no rebound, no ascites, no appreciable mass Extremities: No significant cyanosis, clubbing, or edema bilateral lower extremities  CBC: Recent Labs  Lab 06/11/23 1410 06/11/23 1411 06/13/23 0552 06/13/23 1345 06/14/23 0644  WBC 11.8*   < > 17.0* 17.8* 14.4*  NEUTROABS 9.9*  --   --   --   --  HGB 11.3*   < > 7.9* 7.7* 7.6*  HCT 35.9*   < > 24.2* 24.0* 23.5*  MCV 94.2   < > 92.0 92.3 92.9  PLT 225   < > 213 227 221   < > = values in this interval not displayed.   Basic Metabolic  Panel: Recent Labs  Lab 06/12/23 0811 06/13/23 0552 06/14/23 0644  NA 134* 134* 135  K 4.7 4.5 4.5  CL 101 100 100  CO2 23 23 24   GLUCOSE 153* 246* 165*  BUN 26* 30* 39*  CREATININE 1.35* 1.58* 1.73*  CALCIUM 8.8* 8.8* 8.6*  MG  --  2.1  --    GFR: Estimated Creatinine Clearance: 35.9 mL/min (A) (by C-G formula based on SCr of 1.73 mg/dL (H)).   Scheduled Meds:  amiodarone  200 mg Oral Daily   docusate sodium  100 mg Oral BID   ezetimibe  10 mg Oral Daily   insulin aspart  0-9 Units Subcutaneous TID WC   insulin glargine-yfgn  12 Units Subcutaneous Daily   magnesium oxide  400 mg Oral Daily   metoprolol succinate  25 mg Oral Daily   pantoprazole  40 mg Oral Daily   Vitamin D (Ergocalciferol)  50,000 Units Oral Q7 days   warfarin  1.5 mg Oral ONCE-1600   Warfarin - Pharmacist Dosing Inpatient   Does not apply q1600      LOS: 3 days   Lonia Blood, MD [facility] Office  [phone] Pager - Text Page per Loretha Stapler  If 7PM-7AM, please contact night-coverage per Amion 06/14/2023, 10:05 AM

## 2023-06-14 NOTE — Discharge Instructions (Signed)
Orthopaedic Trauma Service Discharge Instructions   General Discharge Instructions  WEIGHT BEARING STATUS:weightbearing right lower extremity  RANGE OF MOTION/ACTIVITY: Ok for hip and knee motion as tolerated  Wound Care: Incisions can be left open to air if there is no drainage. Once the incision is completely dry and without drainage, it may be left open to air out.  Showering may begin post op day 3 (Wednesday 06/15/23).  Clean incision gently with soap and water.  DVT/PE prophylaxis: Coumadin  Diet: as you were eating previously.  Can use over the counter stool softeners and bowel preparations, such as Miralax, to help with bowel movements.  Narcotics can be constipating.  Be sure to drink plenty of fluids  PAIN MEDICATION USE AND EXPECTATIONS  You have likely been given narcotic medications to help control your pain.  After a traumatic event that results in an fracture (broken bone) with or without surgery, it is ok to use narcotic pain medications to help control one's pain.  We understand that everyone responds to pain differently and each individual patient will be evaluated on a regular basis for the continued need for narcotic medications. Ideally, narcotic medication use should last no more than 6-8 weeks (coinciding with fracture healing).   As a patient it is your responsibility as well to monitor narcotic medication use and report the amount and frequency you use these medications when you come to your office visit.   We would also advise that if you are using narcotic medications, you should take a dose prior to therapy to maximize you participation.  IF YOU ARE ON NARCOTIC MEDICATIONS IT IS NOT PERMISSIBLE TO OPERATE A MOTOR VEHICLE (MOTORCYCLE/CAR/TRUCK/MOPED) OR HEAVY MACHINERY DO NOT MIX NARCOTICS WITH OTHER CNS (CENTRAL NERVOUS SYSTEM) DEPRESSANTS SUCH AS ALCOHOL   STOP SMOKING OR USING NICOTINE PRODUCTS!!!!  As discussed nicotine severely impairs your body's ability  to heal surgical and traumatic wounds but also impairs bone healing.  Wounds and bone heal by forming microscopic blood vessels (angiogenesis) and nicotine is a vasoconstrictor (essentially, shrinks blood vessels).  Therefore, if vasoconstriction occurs to these microscopic blood vessels they essentially disappear and are unable to deliver necessary nutrients to the healing tissue.  This is one modifiable factor that you can do to dramatically increase your chances of healing your injury.    (This means no smoking, no nicotine gum, patches, etc)  DO NOT USE NONSTEROIDAL ANTI-INFLAMMATORY DRUGS (NSAID'S)  Using products such as Advil (ibuprofen), Aleve (naproxen), Motrin (ibuprofen) for additional pain control during fracture healing can delay and/or prevent the healing response.  If you would like to take over the counter (OTC) medication, Tylenol (acetaminophen) is ok.  However, some narcotic medications that are given for pain control contain acetaminophen as well. Therefore, you should not exceed more than 4000 mg of tylenol in a day if you do not have liver disease.  Also note that there are may OTC medicines, such as cold medicines and allergy medicines that my contain tylenol as well.  If you have any questions about medications and/or interactions please ask your doctor/PA or your pharmacist.      ICE AND ELEVATE INJURED/OPERATIVE EXTREMITY  Using ice and elevating the injured extremity above your heart can help with swelling and pain control.  Icing in a pulsatile fashion, such as 20 minutes on and 20 minutes off, can be followed.    Do not place ice directly on skin. Make sure there is a barrier between to skin and the  ice pack.    Using frozen items such as frozen peas works well as the conform nicely to the are that needs to be iced.  USE AN ACE WRAP OR TED HOSE FOR SWELLING CONTROL  In addition to icing and elevation, Ace wraps or TED hose are used to help limit and resolve swelling.  It is  recommended to use Ace wraps or TED hose until you are informed to stop.    When using Ace Wraps start the wrapping distally (farthest away from the body) and wrap proximally (closer to the body)   Example: If you had surgery on your leg or thing and you do not have a splint on, start the ace wrap at the toes and work your way up to the thigh        If you had surgery on your upper extremity and do not have a splint on, start the ace wrap at your fingers and work your way up to the upper arm  CALL THE OFFICE WITH ANY QUESTIONS OR CONCERNS: [phone]   VISIT OUR WEBSITE FOR ADDITIONAL INFORMATION: [email or web address]    Discharge Wound Care Instructions  Do NOT apply any ointments, solutions or lotions to pin sites or surgical wounds.  These prevent needed drainage and even though solutions like hydrogen peroxide kill bacteria, they also damage cells lining the pin sites that help fight infection.  Applying lotions or ointments can keep the wounds moist and can cause them to breakdown and open up as well. This can increase the risk for infection. When in doubt call the office.  If any drainage is noted, use foam dressings - These dressing supplies should be available at local medical supply stores [facility], [facility], etc) as well as Insurance claims handler ([facility], [facility], [employer], etc)  Once the incision is completely dry and without drainage, it may be left open to air out.  Showering may begin 36-48 hours later.  Cleaning gently with soap and water.

## 2023-06-14 NOTE — Plan of Care (Signed)

## 2023-06-14 NOTE — Progress Notes (Signed)
Occupational Therapy Treatment Patient Details Name: [name] MRN: [id number] DOB: [birth year] Today's Date: 06/14/2023   History of present illness 73 yo female fall at home s/p 2/16 R IMN femur PMH heart block with pacemaker, atrial fibrillation, aortic stenosis s/p AVR, hypertrophic cardiomyopathy, HTN DM2 R TKA, R rotator cuff repair and obesity   OT comments  Pt at this time requires increased (A) to power up from surface and decreased weight shift onto R LE. Pt reports BUE feel fatigued with use during transfers. Recommendation for elevated surfaces and bariatric stedy. Pt does well with counting 123 to sequence. Recommendation updated to skilled inpatient follow up therapy, <3 hours/day.       If plan is discharge home, recommend the following:  Two people to help with walking and/or transfers;A lot of help with bathing/dressing/bathroom   Equipment Recommendations  BSC/3in1;Other (comment) (RW)    Recommendations for Other Services      Precautions / Restrictions Precautions Precautions: Fall Recall of Precautions/Restrictions: Intact Restrictions Weight Bearing Restrictions Per Provider Order: No       Mobility Bed Mobility Overal bed mobility: Needs Assistance Bed Mobility: Supine to Sit     Supine to sit: +2 for physical assistance, Mod assist     General bed mobility comments: pt needs 1 step cues to sequence task and progress LB toward EOB. pt does demonstrate bridging with LLE with minimal clearance of buttock. pt unable to roll onto L shoulder. Pt needs (A) to elevate trunk from bed surface    Transfers Overall transfer level: Needs assistance Equipment used: Rolling walker (2 wheels) Transfers: Sit to/from Stand Sit to Stand: +2 physical assistance, Mod assist           General transfer comment: pt requires slightly elevated bed surface. pt needs (A) to power up. pt able to static stand but unable to shift weight. pt using stedy to progress EOB  to [facility] to chair.     Balance Overall balance assessment: Needs assistance Sitting-balance support: Bilateral upper extremity supported, Feet supported Sitting balance-Leahy Scale: Fair     Standing balance support: Bilateral upper extremity supported Standing balance-Leahy Scale: Poor                             ADL either performed or assessed with clinical judgement   ADL Overall ADL's : Needs assistance/impaired Eating/Feeding: Independent;Bed level                   Lower Body Dressing: Maximal assistance Lower Body Dressing Details (indicate cue type and reason): able to reach R sock and pull higher Toilet Transfer: +2 for physical assistance;+2 for safety/equipment;Moderate assistance;BSC/3in1 (bari stedy)                  Extremity/Trunk Assessment Upper Extremity Assessment Upper Extremity Assessment: Generalized weakness   Lower Extremity Assessment Lower Extremity Assessment: Defer to PT evaluation        Vision   Vision Assessment?: No apparent visual deficits   Perception     Praxis     Communication Communication Communication: No apparent difficulties   Cognition Arousal: Alert Behavior During Therapy: WFL for tasks assessed/performed Cognition: No apparent impairments                               Following commands: Intact        Cueing  Cueing Techniques: Verbal cues, Gestural cues, Tactile cues  Exercises      Shoulder Instructions       General Comments see vitals -- stable BP throughout session    Pertinent Vitals/ Pain       Pain Assessment Pain Assessment: 0-10 Pain Score: 7  Pain Location: R hip area and side of thigh Pain Descriptors / Indicators: Operative site guarding Pain Intervention(s): Monitored during session, Premedicated before session, Repositioned  Home Living                                          Prior Functioning/Environment               Frequency  Min 1X/week        Progress Toward Goals  OT Goals(current goals can now be found in the care plan section)  Progress towards OT goals: Progressing toward goals  Acute Rehab OT Goals Patient Stated Goal: to go to rehab OT Goal Formulation: With patient Time For Goal Achievement: 06/27/23 Potential to Achieve Goals: Good ADL Goals Pt Will Perform Lower Body Dressing: with min assist;with adaptive equipment;sit to/from stand Pt Will Transfer to Toilet: with contact guard assist;bedside commode;stand pivot transfer Additional ADL Goal #1: pt will complete bed mobility mod I as precursor to adls  Plan      Co-evaluation    PT/OT/SLP Co-Evaluation/Treatment: Yes Reason for Co-Treatment: For patient/therapist safety;To address functional/ADL transfers   OT goals addressed during session: ADL's and self-care;Strengthening/ROM      AM-PAC OT "6 Clicks" Daily Activity     Outcome Measure   Help from another person eating meals?: None Help from another person taking care of personal grooming?: A Little Help from another person toileting, which includes using toliet, bedpan, or urinal?: A Lot Help from another person bathing (including washing, rinsing, drying)?: A Lot Help from another person to put on and taking off regular upper body clothing?: A Lot Help from another person to put on and taking off regular lower body clothing?: A Lot 6 Click Score: 15    End of Session Equipment Utilized During Treatment: Gait belt;Other (comment) (stedy)  OT Visit Diagnosis: Unsteadiness on feet (R26.81);Muscle weakness (generalized) (M62.81)   Activity Tolerance Patient tolerated treatment well   Patient Left in chair;with call bell/phone within reach;with chair alarm set   Nurse Communication Mobility status;Precautions        Time: 8469-6295 OT Time Calculation (min): 40 min  Charges: OT General Charges $OT Visit: 1 Visit OT Treatments $Self Care/Home  Management : 8-22 mins   Brynn, OTR/L  Acute Rehabilitation Services Office: [phone] .   Mateo Flow 06/14/2023, 1:43 PM

## 2023-06-14 NOTE — Progress Notes (Signed)
Orthopaedic Trauma Progress Note  SUBJECTIVE: Doing ok this morning. Notes mild/moderate pain in the right hip and thigh this morning.  Felt like her therapy sessions went well yesterday but had an extremely difficult time mobilizing overnight to the bathroom. She denies any numbness or tingling throughout the right lower extremity.  No chest pain. No SOB. No nausea/vomiting. No other complaints.   Patient states she would ideally like to return home at discharge but is concerned that her husband alone may not be enough help at first.  She is not opposed to the idea of SNF for short period at discharge if needed.  OBJECTIVE:  Vitals:   06/14/23 0435 06/14/23 0739  BP: (!) 128/54 (!) 154/66  Pulse: (!) 59 60  Resp:  17  Temp: 98 F (36.7 C) 98.2 F (36.8 C)  SpO2: 94% 95%    General: Sitting up in bed, no acute distress.  Pleasant and cooperative Respiratory: No increased work of breathing.  Right lower extremity: Dressings removed, incisions are clean, dry, intact.  Some tenderness with palpation of the hip as expected.  No significant tenderness throughout the thigh or lower leg.  No calf tenderness.  Ankle dorsiflexion/plantarflexion intact.  Endorses sensation to light touch distally.  Neurovascularly intact.  IMAGING: Stable post op imaging.   LABS:  Results for orders placed or performed during the hospital encounter of 06/11/23 (from the past 24 hours)  Glucose, capillary     Status: Abnormal   Collection Time: 06/13/23 12:11 PM  Result Value Ref Range   Glucose-Capillary 207 (H) 70 - 99 mg/dL   Comment 1 Notify RN   CBC     Status: Abnormal   Collection Time: 06/13/23  1:45 PM  Result Value Ref Range   WBC 17.8 (H) 4.0 - 10.5 K/uL   RBC 2.60 (L) 3.87 - 5.11 MIL/uL   Hemoglobin 7.7 (L) 12.0 - 15.0 g/dL   HCT 60.4 (L) 54.0 - 98.1 %   MCV 92.3 80.0 - 100.0 fL   MCH 29.6 26.0 - 34.0 pg   MCHC 32.1 30.0 - 36.0 g/dL   RDW 19.1 47.8 - 29.5 %   Platelets 227 150 - 400 K/uL    nRBC 0.0 0.0 - 0.2 %  Glucose, capillary     Status: Abnormal   Collection Time: 06/13/23  4:52 PM  Result Value Ref Range   Glucose-Capillary 168 (H) 70 - 99 mg/dL   Comment 1 Notify RN   Glucose, capillary     Status: Abnormal   Collection Time: 06/13/23  8:42 PM  Result Value Ref Range   Glucose-Capillary 204 (H) 70 - 99 mg/dL  CBC     Status: Abnormal   Collection Time: 06/14/23  6:44 AM  Result Value Ref Range   WBC 14.4 (H) 4.0 - 10.5 K/uL   RBC 2.53 (L) 3.87 - 5.11 MIL/uL   Hemoglobin 7.6 (L) 12.0 - 15.0 g/dL   HCT 62.1 (L) 30.8 - 65.7 %   MCV 92.9 80.0 - 100.0 fL   MCH 30.0 26.0 - 34.0 pg   MCHC 32.3 30.0 - 36.0 g/dL   RDW 84.6 96.2 - 95.2 %   Platelets 221 150 - 400 K/uL   nRBC 0.0 0.0 - 0.2 %  Protime-INR     Status: Abnormal   Collection Time: 06/14/23  6:44 AM  Result Value Ref Range   Prothrombin Time 20.8 (H) 11.4 - 15.2 seconds   INR 1.8 (H) 0.8 - 1.2  Basic metabolic panel     Status: Abnormal   Collection Time: 06/14/23  6:44 AM  Result Value Ref Range   Sodium 135 135 - 145 mmol/L   Potassium 4.5 3.5 - 5.1 mmol/L   Chloride 100 98 - 111 mmol/L   CO2 24 22 - 32 mmol/L   Glucose, Bld 165 (H) 70 - 99 mg/dL   BUN 39 (H) 8 - 23 mg/dL   Creatinine, Ser 1.91 (H) 0.44 - 1.00 mg/dL   Calcium 8.6 (L) 8.9 - 10.3 mg/dL   GFR, Estimated 30 (L) >60 mL/min   Anion gap 11 5 - 15  Glucose, capillary     Status: Abnormal   Collection Time: 06/14/23  7:35 AM  Result Value Ref Range   Glucose-Capillary 152 (H) 70 - 99 mg/dL    ASSESSMENT: [name] is a 73 y.o. female, 2 Days Post-Op s/p INTRAMEDULLARY NAIL RIGHT INTERTROCHANTERIC FEMUR FRACTURE  CV/Blood loss: Acute blood loss anemia, Hgb 7.6 this morning.  Continue to monitor CBC. Hemodynamically stable  PLAN: Weightbearing: WBAT RLE ROM: Unrestricted ROM Incisional and dressing care: Dressings removed today.  Okay to leave open to air.  Change Mepilex as needed Showering: Okay to begin showering getting  incisions wet 06/15/2023 Orthopedic device(s): None  Pain management:  1. Tylenol 650 mg q 6 hours PRN 2. Robaxin 500 mg q 6 hours PRN 3. Oxycodone 5 mg q 4 hours PRN 4. Dilaudid 0.5 mg q 2 hours PRN VTE prophylaxis: Coumadin restarted 06/13/2023.  SCDs ID:  Ancef 2gm post op completed Foley/Lines:  No foley, KVO IVFs Impediments to Fracture Healing: Vitamin D level 7, started on supplementation Dispo: PT/OT evaluation ongoing, dispo pending.  I feel that patient may benefit from SNF at discharge.  I am concerned with her current level of mobility she will not have enough assistance at home.  Will see how she progresses over the next several days.  Continue to monitor CBC.  Okay for discharge from ortho standpoint once cleared by medicine team and therapies  D/C recommendations: -Oxycodone and Robaxin for pain control -Home dose Coumadin for DVT prophylaxis -Continue 50,000 units Vit D supplementation weekly x 8 weeks  Follow - up plan: 2 weeks for wound check and repeat x-rays   Contact information:  Truitt Merle MD, Thyra Breed PA-C. After hours and holidays please check [email or web address] for group call information for [facility]   Thompson Caul, PA-C [phone] (office) [email or web address]

## 2023-06-14 NOTE — Progress Notes (Signed)
With much difficultly using the stedy it took 3 persons to get to the bathroom. She was having trouble standing up straight after getting to bathroom pain mediation was given and ice placed in right hip

## 2023-06-14 NOTE — TOC Progression Note (Addendum)
Transition of Care [facility]) - Progression Note    Patient Details  Name: [name] MRN: [id number] Date of Birth: November 21, 1950  Transition of [facility]) CM/SW Contact  Carley Hammed, LCSW Phone Number: 06/14/2023, 10:27 AM  Clinical Narrative:    CSW spoke with pt and advised of recommendation for SNF. Pt is agreeable and notes she lives home with her husband and is concerned he cannot help care for her. Pt has never been to SNF and has no preference, other than it is a good facility. CSW answered questions on insurance coverage and process. CSW to complete workup and faxout. TOC will continue to follow.   2:30 bed offers given to pt with Medicare ratings. Pt to review and let CSW know her choice after speaking with spouse. Expected Discharge Plan: Home w Home Health Services Barriers to Discharge: Continued Medical Work up  Expected Discharge Plan and Services   Discharge Planning Services: CM Consult Post Acute Care Choice: Durable Medical Equipment, Home Health Living arrangements for the past 2 months: Single Family Home                           HH Arranged: PT, OT HH Agency: [facility] Date [facility] Agency Contacted: 06/13/23 Time HH Agency Contacted: 1621 Representative spoke with at [facility] Agency: Kandee Keen   Social Determinants of Health (SDOH) Interventions SDOH Screenings   Food Insecurity: No Food Insecurity (06/12/2023)  Housing: Unknown (06/12/2023)  Transportation Needs: No Transportation Needs (06/12/2023)  Utilities: Not At Risk (06/12/2023)  Depression (PHQ2-9): Low Risk  (04/18/2023)  Social Connections: Socially Integrated (06/12/2023)  Tobacco Use: Medium Risk (06/12/2023)    Readmission Risk Interventions     No data to display

## 2023-06-14 NOTE — Progress Notes (Signed)
Physical Therapy Treatment Patient Details Name: [name] MRN: [id number] DOB: 1951-02-07 Today's Date: 06/14/2023 History of present illness 73 yo female fall at home s/p 2/16 R IMN femur PMH heart block with pacemaker, atrial fibrillation, aortic stenosis s/p AVR, hypertrophic cardiomyopathy, HTN DM2 R TKA, R rotator cuff repair and obesity    PTA comments Pt received in supine, agreeable to therapy session and with good effort for bed mobility and transfer training with RW and Stedy. Pt standing tolerance limited due to pain, expressed fear of falls and pt unable to weight shift in stance due to c/o subjective RLE weakness post-op and unable to accept weight through RLE to step with LLE. Worked on supine/seated BLE A/AAROM for strengthening, pursed-lip breathing, and transfers with +2 assist needed for all aspects of mobility. Of note, pt reports difficulty fully emptying her bladder and states "it goes in spurts". Pt totalA to pivot due to inability to take pivotal steps with RW safely and used [employer], RN notified and pt up in chair at end of session with alarm activated for safety. Pt will continue to benefit from skilled rehab in a post acute setting to maximize functional gains before returning home.     Subjective Data  Patient Stated Goal Be able to return home with her husband  Precautions  Precautions Fall  Recall of Precautions/Restrictions Intact  Precaution/Restrictions Comments verbalized fear of falls  Restrictions  Weight Bearing Restrictions Per Provider Order No  Pain Assessment  Pain Assessment 0-10  Pain Score 7  Pain Location R hip area and side of thigh  Pain Descriptors / Indicators Operative site guarding;Grimacing  Pain Intervention(s) Monitored during session;Repositioned;Premedicated before session;Ice applied (ice to R hip for pain and small ice back on upper back due to feeling hot at end of session)  Cognition  Arousal Alert  Behavior During  Therapy [facility] for tasks assessed/performed;Anxious  PT - Cognitive impairments Initiation;Attention;Sequencing;Problem solving  PT - Cognition Comments slow to initiate, needs frequent cues for sequencing/improved body mechanics and pursed-lip breathing due to pain and pt verbalized fear of falls  Following Commands  Following commands Intact  Cueing  Cueing Techniques Verbal cues;Gestural cues;Tactile cues  Communication  Communication No apparent difficulties  Bed Mobility  Overal bed mobility Needs Assistance  Bed Mobility Supine to Sit  Supine to sit +2 for physical assistance;Mod assist;Used rails  General bed mobility comments pt needs 1 step cues to sequence task and progress LB toward EOB. pt does demonstrate bridging with LLE with minimal clearance of buttock. pt unable to roll onto L shoulder. Pt needs (A) to elevate trunk from bed surface with +2 assist due to pt anxiety and poor body mechanics/sequencing.  Transfers  Overall transfer level Needs assistance  Equipment used Rolling walker (2 wheels)  Transfers Sit to/from Stand  Sit to Stand +2 physical assistance;Mod assist;From elevated surface  General transfer comment pt requires elevated bed and BSC surfaces. STS x 2 from EOB with RW, pt able to static stand but unable to shift weight once standing. pt using Stedy to progress EOB to [facility], then to chair.  Ambulation/Gait  Pre-gait activities pt unable to lift LLE off ground standing in RW and in [place] despite max cues and assist to weight shift. Poor weight acceptance through RLE  Balance  Overall balance assessment Needs assistance  Sitting-balance support Bilateral upper extremity supported;Feet supported  Sitting balance-Leahy Scale Fair  Standing balance support Bilateral upper extremity supported;Reliant on assistive device for balance  Standing balance-Leahy Scale Poor  General Comments  General comments (skin integrity, edema, etc.) BP stable, checked  supine/sitting/standing and no significant drop in any posture. SpO2/HR WFL on RA  Exercises  Exercises Other exercises  Other Exercises  Other Exercises seated RLE AAROM: LAQ x10 reps  Other Exercises supine BLE AROM: ankle pumps, hip ab/adduction x5 reps ea for warm-up  Other Exercises cues for pursed-lip breathing x5 reps x3 sets  PT - End of Session  Equipment Utilized During Treatment Gait belt  Activity Tolerance Patient limited by pain  Patient left in chair;with call bell/phone within reach;with chair alarm set;Other (comment) (reclined, ice to R hip and fan turned on per pt request)  Nurse Communication Mobility status;Need for lift equipment;Other (comment) (pt requesting a bath, linens removed but bed needs to be made up)   PT - Assessment/Plan  PT Visit Diagnosis Unsteadiness on feet (R26.81);Pain  Pain - Right/Left Right  Pain - part of body Hip  PT Frequency (ACUTE ONLY) Min 1X/week  Follow Up Recommendations Skilled nursing-short term rehab (<3 hours/day)  Can patient physically be transported by private vehicle No  Patient can return home with the following A lot of help with bathing/dressing/bathroom;Two people to help with walking and/or transfers;Assistance with cooking/housework;Assist for transportation;Help with stairs or ramp for entrance  PT equipment None recommended by PT (TBD post-acute, currently would need mechanical lift and wheelchair)  AM-PAC PT "6 Clicks" Mobility Outcome Measure (Version 2)  Help needed turning from your back to your side while in a flat bed without using bedrails? 2  Help needed moving from lying on your back to sitting on the side of a flat bed without using bedrails? 2  Help needed moving to and from a bed to a chair (including a wheelchair)? 1  Help needed standing up from a chair using your arms (e.g., wheelchair or bedside chair)? 1 (+2 and Stedy lift)  Help needed to walk in hospital room? 1  Help needed climbing 3-5 steps with  a railing?  1  6 Click Score 8  Consider Recommendation of Discharge To: CIR/SNF/LTACH  Progressive Mobility  What is the highest level of mobility based on the progressive mobility assessment? Level 2 (Chairfast) - Balance while sitting on edge of bed and cannot stand  Mobility Referral  (+2)  Activity Stood at bedside;Transferred to/from [facility];Transferred from bed to chair  PT Goal Progression  Progress towards PT goals Progressing toward goals  Acute Rehab PT Goals  PT Goal Formulation With patient  Time For Goal Achievement 06/27/23  PT Time Calculation  PT Start Time (ACUTE ONLY) 1247  PT Stop Time (ACUTE ONLY) 1339  PT Time Calculation (min) (ACUTE ONLY) 52 min  PT General Charges  $$ ACUTE PT VISIT 1 Visit  PT Treatments  $Therapeutic Exercise 8-22 mins  $Therapeutic Activity 8-22 mins   Calum Cormier P., PTA Acute Rehabilitation Services Secure Chat Preferred 9a-5:30pm Office: [phone]

## 2023-06-15 DIAGNOSIS — S72001A Fracture of unspecified part of neck of right femur, initial encounter for closed fracture: Secondary | ICD-10-CM

## 2023-06-15 LAB — IRON AND TIBC
Iron: 22 ug/dL — ABNORMAL LOW (ref 28–170)
Saturation Ratios: 7 % — ABNORMAL LOW (ref 10.4–31.8)
TIBC: 304 ug/dL (ref 250–450)
UIBC: 282 ug/dL

## 2023-06-15 LAB — GLUCOSE, CAPILLARY
Glucose-Capillary: 242 mg/dL — ABNORMAL HIGH (ref 70–99)
Glucose-Capillary: 150 mg/dL — ABNORMAL HIGH (ref 70–99)
Glucose-Capillary: 218 mg/dL — ABNORMAL HIGH (ref 70–99)
Glucose-Capillary: 175 mg/dL — ABNORMAL HIGH (ref 70–99)

## 2023-06-15 LAB — BASIC METABOLIC PANEL
Anion gap: 8 (ref 5–15)
BUN: 37 mg/dL — ABNORMAL HIGH (ref 8–23)
CO2: 24 mmol/L (ref 22–32)
Calcium: 8.5 mg/dL — ABNORMAL LOW (ref 8.9–10.3)
Chloride: 102 mmol/L (ref 98–111)
Creatinine, Ser: 1.47 mg/dL — ABNORMAL HIGH (ref 0.44–1.00)
GFR, Estimated: 37 mL/min — ABNORMAL LOW (ref >60)
Glucose, Bld: 155 mg/dL — ABNORMAL HIGH (ref 70–99)
Potassium: 5.1 mmol/L (ref 3.5–5.1)
Sodium: 134 mmol/L — ABNORMAL LOW (ref 135–145)

## 2023-06-15 LAB — CBC
HCT: 23.5 % — ABNORMAL LOW (ref 36.0–46.0)
Hemoglobin: 7.6 g/dL — ABNORMAL LOW (ref 12.0–15.0)
MCH: 30.3 pg (ref 26.0–34.0)
MCHC: 32.3 g/dL (ref 30.0–36.0)
MCV: 93.6 fL (ref 80.0–100.0)
Platelets: 244 10*3/uL (ref 150–400)
RBC: 2.51 MIL/uL — ABNORMAL LOW (ref 3.87–5.11)
RDW: 13.8 % (ref 11.5–15.5)
WBC: 12.5 10*3/uL — ABNORMAL HIGH (ref 4.0–10.5)
nRBC: 0 % (ref 0.0–0.2)

## 2023-06-15 LAB — PROTIME-INR
INR: 2 — ABNORMAL HIGH (ref 0.8–1.2)
Prothrombin Time: 22.7 s — ABNORMAL HIGH (ref 11.4–15.2)

## 2023-06-15 MED ORDER — VITAMIN D (ERGOCALCIFEROL) 1.25 MG (50000 UNIT) PO CAPS: 50000.0000 [IU] | ORAL_CAPSULE | ORAL | 0 refills | Status: DC

## 2023-06-15 MED ORDER — ACETAMINOPHEN 325 MG PO TABS: 650.0000 mg | ORAL_TABLET | Freq: Four times a day (QID) | ORAL | Status: AC | PRN

## 2023-06-15 MED ORDER — SENNA 8.6 MG PO TABS
1.0000 | ORAL_TABLET | Freq: Every day | ORAL | Status: DC | PRN
  Administered 2023-06-15: 8.6 mg via ORAL
  Filled 2023-06-15: qty 1

## 2023-06-15 MED ORDER — FERROUS SULFATE 325 (65 FE) MG PO TABS
325.0000 mg | ORAL_TABLET | Freq: Every day | ORAL | Status: DC
  Administered 2023-06-16 – 2023-06-17 (×2): 325 mg via ORAL
  Filled 2023-06-15 (×2): qty 1

## 2023-06-15 MED ORDER — OXYCODONE HCL 5 MG PO TABS: 5.0000 mg | ORAL_TABLET | ORAL | 0 refills | Status: DC | PRN

## 2023-06-15 MED ORDER — METHOCARBAMOL 500 MG PO TABS: 500.0000 mg | ORAL_TABLET | Freq: Three times a day (TID) | ORAL | 0 refills | Status: DC | PRN

## 2023-06-15 MED ORDER — WARFARIN SODIUM 3 MG PO TABS
3.0000 mg | ORAL_TABLET | Freq: Once | ORAL | Status: AC
  Administered 2023-06-15: 3 mg via ORAL
  Filled 2023-06-15: qty 1

## 2023-06-15 MED ORDER — POLYETHYLENE GLYCOL 3350 17 G PO PACK
17.0000 g | PACK | Freq: Every day | ORAL | Status: DC
  Administered 2023-06-15 – 2023-06-17 (×3): 17 g via ORAL
  Filled 2023-06-15 (×3): qty 1

## 2023-06-15 NOTE — Plan of Care (Signed)
  Problem: Education: Goal: Knowledge of General Education information will improve Description: Including pain rating scale, medication(s)/side effects and non-pharmacologic comfort measures Outcome: Progressing   Problem: Health Behavior/Discharge Planning: Goal: Ability to manage health-related needs will improve Outcome: Progressing   Problem: Clinical Measurements: Goal: Ability to maintain clinical measurements within normal limits will improve Outcome: Progressing Goal: Will remain free from infection Outcome: Progressing Goal: Diagnostic test results will improve Outcome: Progressing Goal: Respiratory complications will improve Outcome: Progressing Goal: Cardiovascular complication will be avoided Outcome: Progressing   Problem: Activity: Goal: Risk for activity intolerance will decrease Outcome: Progressing   Problem: Nutrition: Goal: Adequate nutrition will be maintained Outcome: Progressing   Problem: Coping: Goal: Level of anxiety will decrease Outcome: Progressing   Problem: Elimination: Goal: Will not experience complications related to bowel motility Outcome: Progressing Goal: Will not experience complications related to urinary retention Outcome: Progressing   Problem: Pain Managment: Goal: General experience of comfort will improve and/or be controlled Outcome: Progressing   Problem: Safety: Goal: Ability to remain free from injury will improve Outcome: Progressing   Problem: Skin Integrity: Goal: Risk for impaired skin integrity will decrease Outcome: Progressing   Problem: Education: Goal: Knowledge of General Education information will improve Description: Including pain rating scale, medication(s)/side effects and non-pharmacologic comfort measures Outcome: Progressing   Problem: Health Behavior/Discharge Planning: Goal: Ability to manage health-related needs will improve Outcome: Progressing   Problem: Clinical Measurements: Goal:  Ability to maintain clinical measurements within normal limits will improve Outcome: Progressing Goal: Will remain free from infection Outcome: Progressing Goal: Diagnostic test results will improve Outcome: Progressing Goal: Respiratory complications will improve Outcome: Progressing Goal: Cardiovascular complication will be avoided Outcome: Progressing   Problem: Activity: Goal: Risk for activity intolerance will decrease Outcome: Progressing   Problem: Nutrition: Goal: Adequate nutrition will be maintained Outcome: Progressing   Problem: Coping: Goal: Level of anxiety will decrease Outcome: Progressing   Problem: Elimination: Goal: Will not experience complications related to bowel motility Outcome: Progressing Goal: Will not experience complications related to urinary retention Outcome: Progressing   Problem: Pain Managment: Goal: General experience of comfort will improve and/or be controlled Outcome: Progressing   Problem: Safety: Goal: Ability to remain free from injury will improve Outcome: Progressing   Problem: Skin Integrity: Goal: Risk for impaired skin integrity will decrease Outcome: Progressing   Problem: Education: Goal: Ability to describe self-care measures that may prevent or decrease complications (Diabetes Survival Skills Education) will improve Outcome: Progressing Goal: Individualized Educational Video(s) Outcome: Progressing   Problem: Coping: Goal: Ability to adjust to condition or change in health will improve Outcome: Progressing   Problem: Fluid Volume: Goal: Ability to maintain a balanced intake and output will improve Outcome: Progressing   Problem: Health Behavior/Discharge Planning: Goal: Ability to identify and utilize available resources and services will improve Outcome: Progressing Goal: Ability to manage health-related needs will improve Outcome: Progressing   Problem: Metabolic: Goal: Ability to maintain appropriate  glucose levels will improve Outcome: Progressing   Problem: Nutritional: Goal: Maintenance of adequate nutrition will improve Outcome: Progressing Goal: Progress toward achieving an optimal weight will improve Outcome: Progressing   Problem: Skin Integrity: Goal: Risk for impaired skin integrity will decrease Outcome: Progressing   Problem: Tissue Perfusion: Goal: Adequacy of tissue perfusion will improve Outcome: Progressing

## 2023-06-15 NOTE — Hospital Course (Signed)
73 year old with a history of heart block status post pacemaker placement, atrial fibrillation, aortic stenosis status post aortic valve replacement, hypertrophic cardiomyopathy, HTN, DM2, and obesity who [facility] to [facility] after suffering a mechanical fall at home followed by severe pain of the right hip and the inability to stand. Imaging in the ER revealed a R comminuted intertrochanteric femur fracture.

## 2023-06-15 NOTE — Progress Notes (Signed)
  Progress Note   Patient: [name] TKZ:[id number] DOB: 1950-11-06 DOA: 06/11/2023     4 DOS: the patient was seen and examined on 06/15/2023   Brief hospital course: 73 year old with a history of heart block status post pacemaker placement, atrial fibrillation, aortic stenosis status post aortic valve replacement, hypertrophic cardiomyopathy, HTN, DM2, and obesity who presented to [facility] after suffering a mechanical fall at home followed by severe pain of the right hip and the inability to stand. Imaging in the ER revealed a R comminuted intertrochanteric femur fracture.   Assessment and Plan: Closed right intertrochanteric femur fracture Management per Orthopedic Surgery - to OR 2/16 - care as follows:   Weightbearing: WBAT RLE ROM: Unrestricted ROM Incisional and dressing care: Dressings were removed 2/18 -leave to open air -utilize Mepilex as needed Showering: Okay to begin showering getting incisions wet 06/15/2023 VTE prophylaxis: Coumadin to be restarted per pharmacy, SCDs Impediments to Fracture Healing: Vitamin D level 7, started on supplementation -Continue 50,000 units Vit D supplementation weekly x 8 weeks Follow - up plan: 2 weeks for wound check and repeat x-rays   Anemia of operative blood loss Not yet at transfusion threshold - hgb stabilized -Pt does have low iron. Will supplement with oral iron   HTN Continue usual metoprolol - holding losartan given post-op increased creatinine   Permanent atrial fibrillation on warfarin therapy Continue usual amiodarone and metoprolol - continue warfarin   CKD stage IIIb Baseline creatinine approximately 1.5 -ARB was held for Cr up to 1.73 -Cr improved today   Subjective: Complaining of constipation  Physical Exam: Vitals:   06/14/23 1949 06/15/23 0531 06/15/23 0727 06/15/23 1548  BP: (!) 143/52 (!) 142/63 (!) 151/57 (!) 136/55  Pulse: 60 (!) 59 60 (!) 59  Resp: 18 18 16 16   Temp: 98 F (36.7 C) 98.1 F (36.7 C)  100 F (37.8 C) 98.3 F (36.8 C)  TempSrc:   Oral Oral  SpO2: 95% 99% 97% 98%  Weight:      Height:       General exam: Awake, laying in bed, in nad Respiratory system: Normal respiratory effort, no wheezing Cardiovascular system: regular rate, s1, s2 Gastrointestinal system: Soft, nondistended, positive BS Central nervous system: CN2-12 grossly intact, strength intact Extremities: Perfused, no clubbing Skin: Normal skin turgor, no notable skin lesions seen Psychiatry: Mood normal // no visual hallucinations   Data Reviewed:  Labs reviewed: Na 134, K 5.1, Cr 1.47, Iron 22, WBC 12.5, Hgb 7.6  Family Communication: Pt in room, family not at bedside  Disposition: Status is: Inpatient Remains inpatient appropriate because: Severity of illness  Planned Discharge Destination: Skilled nursing facility    Author: Rickey Barbara, MD 06/15/2023 5:01 PM  For on call review [email or web address].

## 2023-06-15 NOTE — TOC Progression Note (Addendum)
Transition of Care [facility]) - Progression Note    Patient Details  Name: [name] MRN: [id number] Date of Birth: 06-29-1950  Transition of Care [facility]) CM/SW Contact  Carley Hammed, LCSW Phone Number: 06/15/2023, 8:43 AM  Clinical Narrative:    CSW spoke with pt who stated that she and spouse have chosen [facility]. CSW started authorization, it is currently pending. TOC will follow.  9:50 [facility] notes that they are holding admissions at this time due to staffing issues with the weather. They will follow up on admission for either Thursday or Friday. CSW notified pt and medical team. TOC will continue to follow.  Expected Discharge Plan: Home w Home Health Services Barriers to Discharge: Continued Medical Work up  Expected Discharge Plan and Services   Discharge Planning Services: CM Consult Post Acute Care Choice: Durable Medical Equipment, Home Health Living arrangements for the past 2 months: Single Family Home                           HH Arranged: PT, OT HH Agency: [facility] Date [facility] Agency Contacted: 06/13/23 Time HH Agency Contacted: 1621 Representative spoke with at [facility] Agency: Kandee Keen   Social Determinants of Health (SDOH) Interventions SDOH Screenings   Food Insecurity: No Food Insecurity (06/12/2023)  Housing: Unknown (06/12/2023)  Transportation Needs: No Transportation Needs (06/12/2023)  Utilities: Not At Risk (06/12/2023)  Depression (PHQ2-9): Low Risk  (04/18/2023)  Social Connections: Socially Integrated (06/12/2023)  Tobacco Use: Medium Risk (06/12/2023)    Readmission Risk Interventions     No data to display

## 2023-06-15 NOTE — Progress Notes (Signed)
PHARMACY - ANTICOAGULATION CONSULT NOTE  Pharmacy Consult for warfarin Indication: atrial fibrillation  Allergies  Allergen Reactions   Nystatin Other (See Comments)    unknown   Other Other (See Comments)    Product Containing 3-Hydroxy-3-Methylglutaryl-Coenzyme A Reductase Inhibitor (Product) unknown   Statins Other (See Comments)    Muscles aches,hurt    Patient Measurements: Height: 5\' 8"  (172.7 cm) Weight: 106.6 kg (235 lb) IBW/kg (Calculated) : 63.9  Vital Signs: Temp: 100 F (37.8 C) (02/19 0727) Temp Source: Oral (02/19 0727) BP: 151/57 (02/19 0727) Pulse Rate: 60 (02/19 0727)  Labs: Recent Labs    06/13/23 0552 06/13/23 1345 06/14/23 0644 06/15/23 0552  HGB 7.9* 7.7* 7.6* 7.6*  HCT 24.2* 24.0* 23.5* 23.5*  PLT 213 227 221 244  LABPROT 19.9*  --  20.8* 22.7*  INR 1.7*  --  1.8* 2.0*  CREATININE 1.58*  --  1.73* 1.47*    Estimated Creatinine Clearance: 42.3 mL/min (A) (by C-G formula based on SCr of 1.47 mg/dL (H)).   Medical History: Past Medical History:  Diagnosis Date   Aortic stenosis    21 mm Inspiris Edwards AVR June 2024 - [facility]   Arthritis    Cervical incompetence    GERD (gastroesophageal reflux disease)    Hay fever    Heart block    [employer] DCPPM on 10/26/2022 with Dr. Zachery Conch - [facility]   Hemochromatosis    HOCM (hypertrophic obstructive cardiomyopathy) [facility])    Septal myomectomy June 2024 - [facility]   Hypertension    Migraines    Mitral stenosis    25 mm [employer] mechanical MVR June 2024 - [facility]   Paroxysmal atrial fibrillation (HCC)    Sciatica    Type 2 diabetes mellitus (HCC)     Assessment: 55 YOF admitted for mechanical fall. Patient on warfarin PTA for atrial fibrillation. Last dose of warfarin received prior to admission on 2/15 and has been held since. S/p vitamin K 1mg  IV on 2/15 prior to surgery. Patient underwent surgery for right femur fracture 2/16. Pharmacy consulted to dose warfarin on POD#1 without bridging  therapy.   PTA regimen: 1.5 mg daily on Sat/Tues and 3 mg daily for all other days  INR today therapeutic at 2. Hgb low stable 7.6. plts 200s.  Notable DDI: amiodarone but on PTA.   Goal of Therapy:  INR 2-3 Monitor platelets by anticoagulation protocol: Yes   Plan:  Warfarin 3mg  PO x1 today - home dose Monitor daily INR and CBC Continue to monitor H&H    Rexford Maus, PharmD, BCPS 06/15/2023 9:24 AM

## 2023-06-16 ENCOUNTER — Encounter (HOSPITAL_COMMUNITY): Payer: Medicare Other

## 2023-06-16 ENCOUNTER — Ambulatory Visit: Payer: Medicare Other | Admitting: Internal Medicine

## 2023-06-16 DIAGNOSIS — S72001A Fracture of unspecified part of neck of right femur, initial encounter for closed fracture: Secondary | ICD-10-CM | POA: Diagnosis not present

## 2023-06-16 LAB — CBC
HCT: 22.9 % — ABNORMAL LOW (ref 36.0–46.0)
Hemoglobin: 7.5 g/dL — ABNORMAL LOW (ref 12.0–15.0)
MCH: 30 pg (ref 26.0–34.0)
MCHC: 32.8 g/dL (ref 30.0–36.0)
MCV: 91.6 fL (ref 80.0–100.0)
Platelets: 273 10*3/uL (ref 150–400)
RBC: 2.5 MIL/uL — ABNORMAL LOW (ref 3.87–5.11)
RDW: 13.8 % (ref 11.5–15.5)
WBC: 11.6 10*3/uL — ABNORMAL HIGH (ref 4.0–10.5)
nRBC: 0 % (ref 0.0–0.2)

## 2023-06-16 LAB — GLUCOSE, CAPILLARY
Glucose-Capillary: 220 mg/dL — ABNORMAL HIGH (ref 70–99)
Glucose-Capillary: 162 mg/dL — ABNORMAL HIGH (ref 70–99)
Glucose-Capillary: 218 mg/dL — ABNORMAL HIGH (ref 70–99)
Glucose-Capillary: 186 mg/dL — ABNORMAL HIGH (ref 70–99)

## 2023-06-16 LAB — COMPREHENSIVE METABOLIC PANEL
ALT: 10 U/L (ref 0–44)
AST: 17 U/L (ref 15–41)
Albumin: 2.8 g/dL — ABNORMAL LOW (ref 3.5–5.0)
Alkaline Phosphatase: 58 U/L (ref 38–126)
Anion gap: 9 (ref 5–15)
BUN: 33 mg/dL — ABNORMAL HIGH (ref 8–23)
CO2: 25 mmol/L (ref 22–32)
Calcium: 8.8 mg/dL — ABNORMAL LOW (ref 8.9–10.3)
Chloride: 102 mmol/L (ref 98–111)
Creatinine, Ser: 1.39 mg/dL — ABNORMAL HIGH (ref 0.44–1.00)
GFR, Estimated: 40 mL/min — ABNORMAL LOW (ref >60)
Glucose, Bld: 140 mg/dL — ABNORMAL HIGH (ref 70–99)
Potassium: 4.7 mmol/L (ref 3.5–5.1)
Sodium: 136 mmol/L (ref 135–145)
Total Bilirubin: 0.9 mg/dL (ref 0.0–1.2)
Total Protein: 5.1 g/dL — ABNORMAL LOW (ref 6.5–8.1)

## 2023-06-16 LAB — PROTIME-INR
INR: 1.9 — ABNORMAL HIGH (ref 0.8–1.2)
Prothrombin Time: 22.3 s — ABNORMAL HIGH (ref 11.4–15.2)

## 2023-06-16 MED ORDER — WARFARIN SODIUM 4 MG PO TABS
4.0000 mg | ORAL_TABLET | Freq: Once | ORAL | Status: AC
  Administered 2023-06-16: 4 mg via ORAL
  Filled 2023-06-16: qty 1

## 2023-06-16 NOTE — TOC Progression Note (Signed)
Transition of Care [facility]) - Progression Note    Patient Details  Name: [name] MRN: [id number] Date of Birth: [birth year]  Transition of Care [facility]) CM/SW Contact  Carley Hammed, LCSW Phone Number: 06/16/2023, 10:36 AM  Clinical Narrative:    CSW was notified by [facility] that they will be able to take pt tomorrow. Berkley Harvey is approved through tomorrow. Pt and medical team aware. TOC will continue to follow.    Expected Discharge Plan: Home w Home Health Services Barriers to Discharge: Continued Medical Work up  Expected Discharge Plan and Services   Discharge Planning Services: CM Consult Post Acute Care Choice: Durable Medical Equipment, Home Health Living arrangements for the past 2 months: Single Family Home                           HH Arranged: PT, OT HH Agency: [facility] Date [facility] Agency Contacted: 06/13/23 Time HH Agency Contacted: 1621 Representative spoke with at [facility] Agency: Kandee Keen   Social Determinants of Health (SDOH) Interventions SDOH Screenings   Food Insecurity: No Food Insecurity (06/12/2023)  Housing: Unknown (06/12/2023)  Transportation Needs: No Transportation Needs (06/12/2023)  Utilities: Not At Risk (06/12/2023)  Depression (PHQ2-9): Low Risk  (04/18/2023)  Social Connections: Socially Integrated (06/12/2023)  Tobacco Use: Medium Risk (06/12/2023)    Readmission Risk Interventions     No data to display

## 2023-06-16 NOTE — Progress Notes (Signed)
Mobility Specialist Progress Note:   06/16/23 1232  Mobility  Activity Dangled on edge of bed;Turned to back - supine  Level of Assistance Moderate assist, patient does 50-74%  Assistive Device None  RLE Weight Bearing Per Provider Order WBAT  Activity Response Tolerated well  Mobility Referral Yes  Mobility visit 1 Mobility  Mobility Specialist Start Time (ACUTE ONLY) 1220  Mobility Specialist Stop Time (ACUTE ONLY) 1230  Mobility Specialist Time Calculation (min) (ACUTE ONLY) 10 min   Pt received EOB, requesting to return to supine. ModA to assist with BLE. No complaints stated during session. Pt left in supine with call bell in reach and all needs met.   [name]  Mobility Specialist Please contact via [employer] office at [phone]

## 2023-06-16 NOTE — Plan of Care (Signed)
  Problem: Education: Goal: Knowledge of General Education information will improve Description Including pain rating scale, medication(s)/side effects and non-pharmacologic comfort measures Outcome: Progressing   Problem: Clinical Measurements: Goal: Ability to maintain clinical measurements within normal limits will improve Outcome: Progressing Goal: Will remain free from infection Outcome: Progressing Goal: Respiratory complications will improve Outcome: Progressing Goal: Cardiovascular complication will be avoided Outcome: Progressing   Problem: Safety: Goal: Ability to remain free from injury will improve Outcome: Progressing   Problem: Skin Integrity: Goal: Risk for impaired skin integrity will decrease Outcome: Progressing

## 2023-06-16 NOTE — TOC Progression Note (Signed)
Transition of Care [facility]) - Progression Note    Patient Details  Name: [name] MRN: [id number] Date of Birth: Jun 14, 1950  Transition of [facility]) CM/SW Contact  Carley Hammed, LCSW Phone Number: 06/16/2023, 2:58 PM  Clinical Narrative:    CSW notified by [facility] is halting admissions for a flu outbreak. CSW met with pt at bedside and provided additional offers. Pt chose [facility] [employer]. CSW to update insurance, plan continues to be to DC tomorrow. TOC will continue to follow.    Expected Discharge Plan: Home w Home Health Services Barriers to Discharge: Continued Medical Work up  Expected Discharge Plan and Services   Discharge Planning Services: CM Consult Post Acute Care Choice: Durable Medical Equipment, Home Health Living arrangements for the past 2 months: Single Family Home                           HH Arranged: PT, OT HH Agency: [facility] Date [facility] Agency Contacted: 06/13/23 Time HH Agency Contacted: 1621 Representative spoke with at [facility] Agency: Kandee Keen   Social Determinants of Health (SDOH) Interventions SDOH Screenings   Food Insecurity: No Food Insecurity (06/12/2023)  Housing: Unknown (06/12/2023)  Transportation Needs: No Transportation Needs (06/12/2023)  Utilities: Not At Risk (06/12/2023)  Depression (PHQ2-9): Low Risk  (04/18/2023)  Social Connections: Socially Integrated (06/12/2023)  Tobacco Use: Medium Risk (06/12/2023)    Readmission Risk Interventions     No data to display

## 2023-06-16 NOTE — Progress Notes (Signed)
  Progress Note   Patient: [name] RUE:[id number] DOB: [birth year] DOA: 06/11/2023     5 DOS: the patient was seen and examined on 06/16/2023   Brief hospital course: 73 year old with a history of heart block status post pacemaker placement, atrial fibrillation, aortic stenosis status post aortic valve replacement, hypertrophic cardiomyopathy, HTN, DM2, and obesity who presented to the [facility] after suffering a mechanical fall at home followed by severe pain of the right hip and the inability to stand. Imaging in the ER revealed a R comminuted intertrochanteric femur fracture.   Assessment and Plan: Closed right intertrochanteric femur fracture Management per Orthopedic Surgery - to OR 2/16 - care as follows: -Plan SNF on d/c   Weightbearing: WBAT RLE ROM: Unrestricted ROM Incisional and dressing care: Dressings were removed 2/18 -leave to open air -utilize Mepilex as needed Showering: Okay to begin showering getting incisions wet 06/15/2023 VTE prophylaxis: Coumadin to be restarted per pharmacy, SCDs Impediments to Fracture Healing: Vitamin D level 7, started on supplementation -Continue 50,000 units Vit D supplementation weekly x 8 weeks Follow - up plan: 2 weeks for wound check and repeat x-rays   Anemia of operative blood loss Not yet at transfusion threshold - hgb stabilized -Pt does have low iron. Will supplement with oral iron   HTN Continue usual metoprolol - holding losartan given post-op increased creatinine   Permanent atrial fibrillation on warfarin therapy Continue usual amiodarone and metoprolol - continue warfarin   CKD stage IIIb Baseline creatinine approximately 1.5 -ARB was held for Cr up to 1.73 -Cr improved  Constipation -Resolved   Subjective: Now moving bowels  Physical Exam: Vitals:   06/15/23 2118 06/16/23 0545 06/16/23 0818 06/16/23 1642  BP: 120/82 (!) 147/57 (!) 130/51 (!) 131/59  Pulse: 60 (!) 59 61 (!) 59  Resp: 18 18 17 17   Temp: 98.3 F  (36.8 C) 98.5 F (36.9 C) 98.5 F (36.9 C) 99.2 F (37.3 C)  TempSrc:  Oral Oral Oral  SpO2: 97% 97% 95% 97%  Weight:      Height:       General exam: Conversant, in no acute distress Respiratory system: normal chest rise, clear, no audible wheezing Cardiovascular system: regular rhythm, s1-s2 Gastrointestinal system: Nondistended, nontender, pos BS Central nervous system: No seizures, no tremors Extremities: No cyanosis, no joint deformities Skin: No rashes, no pallor Psychiatry: Affect normal // no auditory hallucinations   Data Reviewed:  Labs reviewed: Na 136, K 4.7, Cr 1.39, WBC 11.6  Family Communication: Pt in room, family not at bedside  Disposition: Status is: Inpatient Remains inpatient appropriate because: Severity of illness  Planned Discharge Destination: Skilled nursing facility    Author: Rickey Barbara, MD 06/16/2023 6:27 PM  For on call review [email or web address].

## 2023-06-16 NOTE — Progress Notes (Signed)
PHARMACY - ANTICOAGULATION CONSULT NOTE  Pharmacy Consult for warfarin Indication: atrial fibrillation  Allergies  Allergen Reactions   Nystatin Other (See Comments)    unknown   Other Other (See Comments)    Product Containing 3-Hydroxy-3-Methylglutaryl-Coenzyme A Reductase Inhibitor (Product) unknown   Statins Other (See Comments)    Muscles aches,hurt    Patient Measurements: Height: 5\' 8"  (172.7 cm) Weight: 106.6 kg (235 lb) IBW/kg (Calculated) : 63.9  Vital Signs: Temp: 98.5 F (36.9 C) (02/20 0545) Temp Source: Oral (02/20 0545) BP: 147/57 (02/20 0545) Pulse Rate: 59 (02/20 0545)  Labs: Recent Labs    06/14/23 0644 06/15/23 0552 06/16/23 0524  HGB 7.6* 7.6* 7.5*  HCT 23.5* 23.5* 22.9*  PLT 221 244 273  LABPROT 20.8* 22.7* 22.3*  INR 1.8* 2.0* 1.9*  CREATININE 1.73* 1.47* 1.39*    Estimated Creatinine Clearance: 44.7 mL/min (A) (by C-G formula based on SCr of 1.39 mg/dL (H)).   Medical History: Past Medical History:  Diagnosis Date   Aortic stenosis    21 mm Inspiris Edwards AVR June 2024 - [facility]   Arthritis    Cervical incompetence    GERD (gastroesophageal reflux disease)    Hay fever    Heart block    [employer] DCPPM on 10/26/2022 with Dr. Zachery Conch - [facility]   Hemochromatosis    HOCM (hypertrophic obstructive cardiomyopathy) [facility])    Septal myomectomy June 2024 - [facility]   Hypertension    Migraines    Mitral stenosis    25 mm [employer]ent mechanical MVR June 2024 - [facility]   Paroxysmal atrial fibrillation (HCC)    Sciatica    Type 2 diabetes mellitus (HCC)     Assessment: 43 YOF admitted for mechanical fall. Patient on warfarin PTA for atrial fibrillation. Last dose of warfarin received prior to admission on 2/15 and has been held since. S/p vitamin K 1mg  IV on 2/15 prior to surgery. Patient underwent surgery for right femur fracture 2/16. Pharmacy consulted to dose warfarin on POD#1 without bridging therapy.   PTA regimen: 1.5 mg daily on Sat/Tues  and 3 mg daily for all other days  INR sub-therapeutic at 1.9. Hgb low stable 7.5. plts 200s.  Notable DDI: amiodarone but on PTA.   Goal of Therapy:  INR 2-3 Monitor platelets by anticoagulation protocol: Yes   Plan:  Warfarin 4mg  PO x1 today - slightly higher dose given subtherapeutic INR.  Monitor daily INR and CBC Continue to monitor H&H    Estill Batten, PharmD, BCCCP  06/16/2023 8:16 AM

## 2023-06-16 NOTE — Progress Notes (Signed)
Occupational Therapy Treatment Patient Details Name: [name] MRN: [id number] DOB: 1950/09/23 Today's Date: 06/16/2023   History of present illness 73 yo female fall at home s/p 2/16 R IMN femur. PMH: heart block with pacemaker, atrial fibrillation, aortic stenosis s/p AVR, hypertrophic cardiomyopathy, HTN, DM2, R TKA, R rotator cuff repair and obesity.   OT comments  Patient making good gains and is eager to progress. Patient requiring mod assist +2 and increased time to get to EOB and to stand from EOB into stedy for transfer to recliner. Patient able to perform grooming tasks while seated in recliner. Patient will benefit from continued inpatient follow up therapy, <3 hours/day. Acute OT to continue to follow to address established goals to facilitate DC to next venue of care.       If plan is discharge home, recommend the following:  Two people to help with walking and/or transfers;A lot of help with bathing/dressing/bathroom   Equipment Recommendations  BSC/3in1;Other (comment) (RW)    Recommendations for Other Services      Precautions / Restrictions Precautions Precautions: Fall Recall of Precautions/Restrictions: Intact Precaution/Restrictions Comments: verbalized fear of falls Restrictions Weight Bearing Restrictions Per Provider Order: Yes RLE Weight Bearing Per Provider Order: Weight bearing as tolerated       Mobility Bed Mobility Overal bed mobility: Needs Assistance Bed Mobility: Supine to Sit     Supine to sit: +2 for physical assistance, Mod assist, Used rails     General bed mobility comments: increased time and assistance with BLEs and bed pad utilized to assist with scooting    Transfers Overall transfer level: Needs assistance Equipment used: Ambulation equipment used Transfers: Sit to/from Stand, Bed to chair/wheelchair/BSC Sit to Stand: +2 physical assistance, Mod assist, From elevated surface           General transfer comment: Mod  assist +2 to stand from EOB into Stedy and min assist +2 from stedy pads Transfer via Lift Equipment: Stedy   Balance Overall balance assessment: Needs assistance Sitting-balance support: Bilateral upper extremity supported, Feet supported Sitting balance-Leahy Scale: Fair     Standing balance support: Bilateral upper extremity supported, Reliant on assistive device for balance Standing balance-Leahy Scale: Poor Standing balance comment: reliant on stedy for support                           ADL either performed or assessed with clinical judgement   ADL Overall ADL's : Needs assistance/impaired     Grooming: Wash/dry hands;Wash/dry face;Oral care;Brushing hair;Set up;Sitting Grooming Details (indicate cue type and reason): in recliner             Lower Body Dressing: Maximal assistance Lower Body Dressing Details (indicate cue type and reason): limited due to pain                    Extremity/Trunk Assessment              Vision       Perception     Praxis     Communication Communication Communication: No apparent difficulties   Cognition Arousal: Alert Behavior During Therapy: WFL for tasks assessed/performed, Anxious Cognition: No apparent impairments                               Following commands: Intact        Cueing   Cueing Techniques: Verbal cues, Gestural  cues, Tactile cues  Exercises      Shoulder Instructions       General Comments VSS    Pertinent Vitals/ Pain       Pain Assessment Pain Assessment: Faces Faces Pain Scale: Hurts even more Pain Location: R hip area and side of thigh Pain Descriptors / Indicators: Operative site guarding, Grimacing, Guarding Pain Intervention(s): Limited activity within patient's tolerance, Monitored during session, Premedicated before session, Repositioned, Ice applied  Home Living                                          Prior  Functioning/Environment              Frequency  Min 1X/week        Progress Toward Goals  OT Goals(current goals can now be found in the care plan section)  Progress towards OT goals: Progressing toward goals  Acute Rehab OT Goals Patient Stated Goal: get more rehab OT Goal Formulation: With patient Time For Goal Achievement: 06/27/23 Potential to Achieve Goals: Good ADL Goals Pt Will Perform Lower Body Dressing: with min assist;with adaptive equipment;sit to/from stand Pt Will Transfer to Toilet: with contact guard assist;bedside commode;stand pivot transfer Additional ADL Goal #1: pt will complete bed mobility mod I as precursor to adls  Plan      Co-evaluation                 AM-PAC OT "6 Clicks" Daily Activity     Outcome Measure   Help from another person eating meals?: None Help from another person taking care of personal grooming?: A Little Help from another person toileting, which includes using toliet, bedpan, or urinal?: A Lot Help from another person bathing (including washing, rinsing, drying)?: A Lot Help from another person to put on and taking off regular upper body clothing?: A Lot Help from another person to put on and taking off regular lower body clothing?: A Lot 6 Click Score: 15    End of Session Equipment Utilized During Treatment: Gait belt;Other (comment) Antony Salmon)  OT Visit Diagnosis: Unsteadiness on feet (R26.81);Muscle weakness (generalized) (M62.81)   Activity Tolerance Patient tolerated treatment well   Patient Left in chair;with call bell/phone within reach;with chair alarm set   Nurse Communication Mobility status;Precautions        Time: 9147-8295 OT Time Calculation (min): 30 min  Charges: OT General Charges $OT Visit: 1 Visit OT Treatments $Self Care/Home Management : 8-22 mins $Therapeutic Activity: 8-22 mins  Alfonse Flavors, OTA Acute Rehabilitation Services  Office [phone]   Dewain Penning 06/16/2023, 2:09  PM

## 2023-06-17 DIAGNOSIS — Z7901 Long term (current) use of anticoagulants: Secondary | ICD-10-CM | POA: Diagnosis not present

## 2023-06-17 DIAGNOSIS — R2689 Other abnormalities of gait and mobility: Secondary | ICD-10-CM | POA: Diagnosis not present

## 2023-06-17 DIAGNOSIS — E1169 Type 2 diabetes mellitus with other specified complication: Secondary | ICD-10-CM | POA: Diagnosis not present

## 2023-06-17 DIAGNOSIS — Z743 Need for continuous supervision: Secondary | ICD-10-CM | POA: Diagnosis not present

## 2023-06-17 DIAGNOSIS — D649 Anemia, unspecified: Secondary | ICD-10-CM | POA: Diagnosis not present

## 2023-06-17 DIAGNOSIS — R531 Weakness: Secondary | ICD-10-CM | POA: Diagnosis not present

## 2023-06-17 DIAGNOSIS — N1832 Chronic kidney disease, stage 3b: Secondary | ICD-10-CM | POA: Diagnosis not present

## 2023-06-17 DIAGNOSIS — E785 Hyperlipidemia, unspecified: Secondary | ICD-10-CM | POA: Diagnosis not present

## 2023-06-17 DIAGNOSIS — I4821 Permanent atrial fibrillation: Secondary | ICD-10-CM | POA: Diagnosis not present

## 2023-06-17 DIAGNOSIS — E559 Vitamin D deficiency, unspecified: Secondary | ICD-10-CM | POA: Diagnosis not present

## 2023-06-17 DIAGNOSIS — S7291XD Unspecified fracture of right femur, subsequent encounter for closed fracture with routine healing: Secondary | ICD-10-CM | POA: Diagnosis not present

## 2023-06-17 DIAGNOSIS — Z7401 Bed confinement status: Secondary | ICD-10-CM | POA: Diagnosis not present

## 2023-06-17 DIAGNOSIS — Z4789 Encounter for other orthopedic aftercare: Secondary | ICD-10-CM | POA: Diagnosis not present

## 2023-06-17 DIAGNOSIS — R278 Other lack of coordination: Secondary | ICD-10-CM | POA: Diagnosis not present

## 2023-06-17 DIAGNOSIS — S72001A Fracture of unspecified part of neck of right femur, initial encounter for closed fracture: Secondary | ICD-10-CM | POA: Diagnosis not present

## 2023-06-17 DIAGNOSIS — I10 Essential (primary) hypertension: Secondary | ICD-10-CM | POA: Diagnosis not present

## 2023-06-17 DIAGNOSIS — R1319 Other dysphagia: Secondary | ICD-10-CM | POA: Diagnosis not present

## 2023-06-17 DIAGNOSIS — Z952 Presence of prosthetic heart valve: Secondary | ICD-10-CM | POA: Diagnosis not present

## 2023-06-17 DIAGNOSIS — R791 Abnormal coagulation profile: Secondary | ICD-10-CM | POA: Diagnosis not present

## 2023-06-17 DIAGNOSIS — R5381 Other malaise: Secondary | ICD-10-CM | POA: Diagnosis not present

## 2023-06-17 DIAGNOSIS — M6281 Muscle weakness (generalized): Secondary | ICD-10-CM | POA: Diagnosis not present

## 2023-06-17 DIAGNOSIS — S72141D Displaced intertrochanteric fracture of right femur, subsequent encounter for closed fracture with routine healing: Secondary | ICD-10-CM | POA: Diagnosis not present

## 2023-06-17 LAB — COMPREHENSIVE METABOLIC PANEL
ALT: 11 U/L (ref 0–44)
AST: 17 U/L (ref 15–41)
Albumin: 2.6 g/dL — ABNORMAL LOW (ref 3.5–5.0)
Alkaline Phosphatase: 60 U/L (ref 38–126)
Anion gap: 12 (ref 5–15)
BUN: 31 mg/dL — ABNORMAL HIGH (ref 8–23)
CO2: 25 mmol/L (ref 22–32)
Calcium: 8.7 mg/dL — ABNORMAL LOW (ref 8.9–10.3)
Chloride: 100 mmol/L (ref 98–111)
Creatinine, Ser: 1.46 mg/dL — ABNORMAL HIGH (ref 0.44–1.00)
GFR, Estimated: 37 mL/min — ABNORMAL LOW (ref >60)
Glucose, Bld: 159 mg/dL — ABNORMAL HIGH (ref 70–99)
Potassium: 4.7 mmol/L (ref 3.5–5.1)
Sodium: 137 mmol/L (ref 135–145)
Total Bilirubin: 1 mg/dL (ref 0.0–1.2)
Total Protein: 5 g/dL — ABNORMAL LOW (ref 6.5–8.1)

## 2023-06-17 LAB — GLUCOSE, CAPILLARY
Glucose-Capillary: 224 mg/dL — ABNORMAL HIGH (ref 70–99)
Glucose-Capillary: 239 mg/dL — ABNORMAL HIGH (ref 70–99)
Glucose-Capillary: 185 mg/dL — ABNORMAL HIGH (ref 70–99)

## 2023-06-17 LAB — PROTIME-INR
INR: 2 — ABNORMAL HIGH (ref 0.8–1.2)
Prothrombin Time: 23.2 s — ABNORMAL HIGH (ref 11.4–15.2)

## 2023-06-17 MED ORDER — WARFARIN SODIUM 3 MG PO TABS
3.0000 mg | ORAL_TABLET | Freq: Once | ORAL | Status: AC
  Administered 2023-06-17: 3 mg via ORAL
  Filled 2023-06-17: qty 1

## 2023-06-17 MED ORDER — POLYETHYLENE GLYCOL 3350 17 G PO PACK: 17.0000 g | PACK | Freq: Every day | ORAL | 0 refills | Status: DC

## 2023-06-17 MED ORDER — DOCUSATE SODIUM 100 MG PO CAPS: 100.0000 mg | ORAL_CAPSULE | Freq: Two times a day (BID) | ORAL | 0 refills | Status: AC

## 2023-06-17 MED ORDER — SENNA 8.6 MG PO TABS: 1.0000 | ORAL_TABLET | Freq: Every day | ORAL | 0 refills | Status: DC | PRN

## 2023-06-17 MED ORDER — FERROUS SULFATE 325 (65 FE) MG PO TABS: 325.0000 mg | ORAL_TABLET | Freq: Every day | ORAL | 0 refills | Status: DC

## 2023-06-17 MED ORDER — INSULIN ASPART 100 UNIT/ML FLEXPEN
0.0000 [IU] | PEN_INJECTOR | Freq: Three times a day (TID) | SUBCUTANEOUS | Status: DC
Start: 2023-06-17 — End: 2024-01-25

## 2023-06-17 MED ORDER — SODIUM CHLORIDE 0.9 % IV SOLN
100.0000 mg | Freq: Once | INTRAVENOUS | Status: AC
  Administered 2023-06-17: 100 mg via INTRAVENOUS
  Filled 2023-06-17: qty 5

## 2023-06-17 MED ORDER — INSULIN GLARGINE 100 UNIT/ML SOLOSTAR PEN
12.0000 [IU] | PEN_INJECTOR | Freq: Every day | SUBCUTANEOUS | Status: DC
Start: 2023-06-17 — End: 2024-01-25

## 2023-06-17 NOTE — Progress Notes (Signed)
Attempted to call report @ [phone]. Facility answered but no answer from Nurse after forwarding my call.

## 2023-06-17 NOTE — Progress Notes (Signed)
 Remote pacemaker transmission.

## 2023-06-17 NOTE — TOC Progression Note (Signed)
Transition of Care [facility]) - Progression Note    Patient Details  Name: [name] MRN: [id number] Date of Birth: [birth year]  [facility]) CM/SW Contact  Mearl Latin, LCSW Phone Number: 06/17/2023, 12:24 PM  Clinical Narrative:    CSW spoke with patient and confirmed plan to discharge to [facility]. She requested PTAR for transport.    Expected Discharge Plan: Skilled Nursing Facility Barriers to Discharge: Barriers Resolved  Expected Discharge Plan and Services   Discharge Planning Services: CM Consult Post Acute Care Choice: Durable Medical Equipment, Home Health Living arrangements for the past 2 months: Single Family Home                 DME Arranged: N/A DME Agency: NA       HH Arranged: PT, OT HH Agency: NA Date HH Agency Contacted: 06/13/23 Time HH Agency Contacted: 1621 Representative spoke with at [facility] Agency: Kandee Keen   Social Determinants of Health (SDOH) Interventions SDOH Screenings   Food Insecurity: No Food Insecurity (06/12/2023)  Housing: Unknown (06/12/2023)  Transportation Needs: No Transportation Needs (06/12/2023)  Utilities: Not At Risk (06/12/2023)  Depression (PHQ2-9): Low Risk  (04/18/2023)  Social Connections: Socially Integrated (06/12/2023)  Tobacco Use: Medium Risk (06/12/2023)    Readmission Risk Interventions     No data to display

## 2023-06-17 NOTE — Progress Notes (Signed)
Report given to [facility] (SNF).

## 2023-06-17 NOTE — Progress Notes (Signed)
Physical Therapy Treatment Patient Details Name: [name] MRN: [id number] DOB: Apr 12, 1951 Today's Date: 06/17/2023   History of Present Illness 73 yo female fall at home s/p 2/16 R IMN femur. PMH: heart block with pacemaker, atrial fibrillation, aortic stenosis s/p AVR, hypertrophic cardiomyopathy, HTN, DM2, R TKA, R rotator cuff repair and obesity.    PT Comments  Pt received in chair after sitting up ~1 hour, pt agreeable to therapy session with emphasis on sit<>stand transfers and pre-gait/weight shifting in standing at RW and [place]. Pt needing up to modA +2 for sit<>stand from lower surface height to RW/Stedy and less lift assist from elevated Stedy flaps. Pt needing consistent +2 physical assist during weight shifting and pivotal/shuffled lateral steps with BUE support. Pt still anxious but able to stand longer and weight shift slightly better than in previous session but still expresses some fear of falls. Pt will continue to benefit from skilled rehab in a post acute setting < 3 hours per day to maximize functional gains before returning home.    If plan is discharge home, recommend the following: A lot of help with bathing/dressing/bathroom;Two people to help with walking and/or transfers;Assistance with cooking/housework;Assist for transportation;Help with stairs or ramp for entrance   Can travel by private vehicle     No  Equipment Recommendations  None recommended by PT;Other (comment) (TBD post-acute, currently would need mechanical lift and wheelchair)    Recommendations for Other Services       Precautions / Restrictions Precautions Precautions: Fall Recall of Precautions/Restrictions: Intact Precaution/Restrictions Comments: verbalized fear of falls Restrictions Weight Bearing Restrictions Per Provider Order: Yes RLE Weight Bearing Per Provider Order: Weight bearing as tolerated     Mobility  Bed Mobility Overal bed mobility: Needs Assistance           Sit  to sidelying: Max assist, +2 for physical assistance, Used rails General bed mobility comments: Pt received in recliner; BLE assist to bring legs up into bed and pt using bed rail to assist with controlled trunk lowering, cues for propping onto her L elbow first to reduce discomfort and for pt safety. +2 assist for posterior supine scoot toward HOB with pt assist with LLE and bil overhead rails    Transfers Overall transfer level: Needs assistance Equipment used: Ambulation equipment used, Rolling walker (2 wheels) Transfers: Sit to/from Stand, Bed to chair/wheelchair/BSC Sit to Stand: +2 physical assistance, Mod assist, From elevated surface, Via lift equipment, Min assist           General transfer comment: Mod assist +2 to stand from chair with RW and pt able to take a few heel to toe pivot steps toward her R side toward the bed, close chair follow for pt safety. Pt c/o feeling hot/weak so sat back down, then PTA and MS brought Stedy and pt transferred rest of the way from chair>bed via [place] with +2 minA to stand from elevated pads and for controlled lowering assist. Transfer via Lift Equipment: Stedy  Ambulation/Gait             Pre-gait activities: in RW, standing shuffled steps x2 and heel to toe pivots a couple times on each leg. After sitting and standing with Stedy, pt able to deweight each foot from floor once with max encouragement/effort before severe fatigue/overwhelm and needing to sit on Stedy flaps.     Stairs             Wheelchair Mobility     Tilt Bed  Modified Rankin (Stroke Patients Only)       Balance Overall balance assessment: Needs assistance Sitting-balance support: Bilateral upper extremity supported, Feet supported Sitting balance-Leahy Scale: Fair     Standing balance support: Bilateral upper extremity supported, Reliant on assistive device for balance Standing balance-Leahy Scale: Poor Standing balance comment: reliant on Stedy/RW  for support                            Communication Communication Communication: No apparent difficulties  Cognition Arousal: Alert Behavior During Therapy: WFL for tasks assessed/performed, Anxious   PT - Cognitive impairments: Problem solving, Sequencing, Attention                       PT - Cognition Comments: Cues for pursed-lip breathing as pt expresses fear of falls and perseveration on pain. Slightly less anxious this date and somewhat improved standing tolerance today. Still c/o "feeling hot" while standing which increased anxiety about mobilization. Following commands: Intact      Cueing Cueing Techniques: Verbal cues, Gestural cues, Tactile cues  Exercises Other Exercises Other Exercises: STS x 3 reps for BLE strengthening Other Exercises: standing weight shift to each side x5 reps ea (while shuffling/heel to toe pivoting) for BLE strengthening Other Exercises: cues for pursed-lip breathing x5 reps x3 sets Other Exercises: supine BLE AROM: ankle pumps x10 reps ea    General Comments General comments (skin integrity, edema, etc.): c/o feeling hot with standing but similar presentation in previous PT session and BP was stable throughout; room is also warm, fan/ice brought to her room for comfort and pt appreciative.      Pertinent Vitals/Pain Pain Assessment Pain Assessment: Faces Faces Pain Scale: Hurts even more Pain Location: R hip area and side of thigh after premedication for pain Pain Descriptors / Indicators: Operative site guarding, Grimacing, Guarding Pain Intervention(s): Monitored during session, Limited activity within patient's tolerance, Premedicated before session, Repositioned, Ice applied, Utilized relaxation techniques    Home Living                          Prior Function            PT Goals (current goals can now be found in the care plan section) Acute Rehab PT Goals Patient Stated Goal: Be able to return home  with her husband PT Goal Formulation: With patient Time For Goal Achievement: 06/27/23 Progress towards PT goals: Progressing toward goals    Frequency    Min 1X/week      PT Plan      Co-evaluation              AM-PAC PT "6 Clicks" Mobility   Outcome Measure  Help needed turning from your back to your side while in a flat bed without using bedrails?: A Lot Help needed moving from lying on your back to sitting on the side of a flat bed without using bedrails?: A Lot Help needed moving to and from a bed to a chair (including a wheelchair)?: Total Help needed standing up from a chair using your arms (e.g., wheelchair or bedside chair)?: A Lot Help needed to walk in hospital room?: Total Help needed climbing 3-5 steps with a railing? : Total 6 Click Score: 9    End of Session Equipment Utilized During Treatment: Gait belt Activity Tolerance: Patient limited by fatigue;Treatment limited secondary to medical complications (Comment);Other (comment) (  anxiety/fear of falls while standing) Patient left: in bed;with call bell/phone within reach;with bed alarm set;Other (comment) (pt heels floated) Nurse Communication: Mobility status;Need for lift equipment PT Visit Diagnosis: Unsteadiness on feet (R26.81);Pain Pain - Right/Left: Right Pain - part of body: Hip     Time: 1209-1232 PT Time Calculation (min) (ACUTE ONLY): 23 min  Charges:    $Therapeutic Exercise: 8-22 mins $Therapeutic Activity: 8-22 mins PT General Charges $$ ACUTE PT VISIT: 1 Visit                     Destiny Hagin P., PTA Acute Rehabilitation Services Secure Chat Preferred 9a-5:30pm Office: [phone]    Angus Palms 06/17/2023, 2:40 PM

## 2023-06-17 NOTE — TOC Transition Note (Signed)
Transition of Care [facility]) - Discharge Note   Patient Details  Name: [name] MRN: [id number] Date of Birth: Sep 21, 1950  Transition of Care [facility]) CM/SW Contact:  Mearl Latin, LCSW Phone Number: 06/17/2023, 3:55 PM   Clinical Narrative:    Patient will DC to: [facility] Anticipated DC date: 06/17/23 Family notified: Pt notified family Transport by: Sharin Mons   Per MD patient ready for DC to [facility]. RN to call report prior to discharge [phone]). RN, patient, patient's family, and facility notified of DC. Discharge Summary and FL2 sent to facility. DC packet on chart including signed scripts. Ambulance transport requested for patient.   CSW will sign off for now as social work intervention is no longer needed. Please consult [place] again if new needs arise.     Final next level of care: Skilled Nursing Facility Barriers to Discharge: Barriers Resolved   Patient Goals and CMS Choice Patient states their goals for this hospitalization and ongoing recovery are:: return home with husband CMS Medicare.gov Compare Post Acute Care list provided to:: Patient Choice offered to / list presented to : Patient      Discharge Placement   Existing PASRR number confirmed : 06/17/23          Patient chooses bed at:  [facility]) Patient to be transferred to facility by: PT[facility] Name of family member notified: Patient to notify Patient and family notified of of transfer: 06/17/23  Discharge Plan and Services Additional resources added to the After Visit Summary for     Discharge Planning Services: CM Consult Post Acute Care Choice: Durable Medical Equipment, Home Health          DME Arranged: N/A DME Agency: NA       HH Arranged: PT, OT HH Agency: NA Date HH Agency Contacted: 06/13/23 Time HH Agency Contacted: 1621 Representative spoke with at [facility] Agency: Kandee Keen  Social Drivers of Health (SDOH) Interventions SDOH Screenings   Food Insecurity: No Food Insecurity (06/12/2023)   Housing: Unknown (06/12/2023)  Transportation Needs: No Transportation Needs (06/12/2023)  Utilities: Not At Risk (06/12/2023)  Depression (PHQ2-9): Low Risk  (04/18/2023)  Social Connections: Socially Integrated (06/12/2023)  Tobacco Use: Medium Risk (06/12/2023)     Readmission Risk Interventions     No data to display

## 2023-06-17 NOTE — Progress Notes (Signed)
Discharge order received.  Saline lock removed.  AVS reviewed with patient and given to [facility] staff as well.  Personal belongings with patient at time of discharge.

## 2023-06-17 NOTE — Progress Notes (Signed)
PHARMACY - ANTICOAGULATION CONSULT NOTE  Pharmacy Consult for warfarin Indication: atrial fibrillation  Allergies  Allergen Reactions   Nystatin Other (See Comments)    unknown   Other Other (See Comments)    Product Containing 3-Hydroxy-3-Methylglutaryl-Coenzyme A Reductase Inhibitor (Product) unknown   Statins Other (See Comments)    Muscles aches,hurt    Patient Measurements: Height: 5\' 8"  (172.7 cm) Weight: 106.6 kg (235 lb) IBW/kg (Calculated) : 63.9  Vital Signs: Temp: 98.1 F (36.7 C) (02/21 0452) Temp Source: Oral (02/21 0452) BP: 153/65 (02/21 0452) Pulse Rate: 61 (02/21 0452)  Labs: Recent Labs    06/15/23 0552 06/16/23 0524 06/17/23 0523  HGB 7.6* 7.5*  --   HCT 23.5* 22.9*  --   PLT 244 273  --   LABPROT 22.7* 22.3* 23.2*  INR 2.0* 1.9* 2.0*  CREATININE 1.47* 1.39* 1.46*    Estimated Creatinine Clearance: 42.6 mL/min (A) (by C-G formula based on SCr of 1.46 mg/dL (H)).   Medical History: Past Medical History:  Diagnosis Date   Aortic stenosis    21 mm Inspiris Edwards AVR June 2024 - [facility]   Arthritis    Cervical incompetence    GERD (gastroesophageal reflux disease)    Hay fever    Heart block    [employer] DCPPM on 10/26/2022 with Dr. Zachery Conch - [facility]   Hemochromatosis    HOCM (hypertrophic obstructive cardiomyopathy) [facility])    Septal myomectomy June 2024 - [facility]   Hypertension    Migraines    Mitral stenosis    25 mm [employer] mechanical MVR June 2024 - [facility]   Paroxysmal atrial fibrillation (HCC)    Sciatica    Type 2 diabetes mellitus (HCC)     Assessment: 52 YOF admitted for mechanical fall. Patient on warfarin PTA for atrial fibrillation. Last dose of warfarin received prior to admission on 2/15 and has been held since. S/p vitamin K 1mg  IV on 2/15 prior to surgery. Patient underwent surgery for right femur fracture 2/16. Pharmacy consulted to dose warfarin on POD#1 without bridging therapy.   PTA regimen: 1.5 mg daily on Sat/Tues  and 3 mg daily for all other days  INR lower end therapeutic at 2.0. 2/20- Hgb low stable 7.5. plts 200s.  Notable DDI: amiodarone but on PTA.   Goal of Therapy:  INR 2-3 Monitor platelets by anticoagulation protocol: Yes   Plan:  Warfarin 3mg  PO x1 today per home dosing.  Monitor daily INR and CBC Continue to monitor H&H    Estill Batten, PharmD, BCCCP  06/17/2023 8:23 AM

## 2023-06-17 NOTE — Discharge Summary (Signed)
Physician Discharge Summary   Patient: [name] MRN: [id number] DOB: 08/14/1950  Admit date:     06/11/2023  Discharge date: 06/17/23  Discharge Physician: Rickey Barbara   PCP: Assunta Found, MD   Recommendations at discharge:    Follow up with PCP in 1-2 weeks Follow up with Orthopedic Surgery as scheduled Recommend recheck CBC in 2-4 weeks, focus on Hgb while on iron Please ensure patient has regular bowel movements and provide additional cathartics as needed  Discharge Diagnoses: Principal Problem:   Closed right femoral fracture (HCC) Active Problems:   Essential hypertension   Permanent atrial fibrillation (HCC)   CKD stage 3b, GFR 30-44 ml/min (HCC)   Type 2 diabetes mellitus with hyperlipidemia (HCC)   Obesity, class 2  Resolved Problems:   * No resolved hospital problems. *  Hospital Course: 73 year old with a history of heart block status post pacemaker placement, atrial fibrillation, aortic stenosis status post aortic valve replacement, hypertrophic cardiomyopathy, HTN, DM2, and obesity who presented to [facility] after suffering a mechanical fall at home followed by severe pain of the right hip and the inability to stand. Imaging in the ER revealed a R comminuted intertrochanteric femur fracture.   Assessment and Plan: Closed right intertrochanteric femur fracture Management per Orthopedic Surgery - to OR 2/16 - care as follows: -Plan SNF on d/c  Weightbearing: WBAT RLE ROM: Unrestricted ROM Incisional and dressing care: Dressings were removed 2/18 -leave to open air -utilize Mepilex as needed Showering: Okay to begin showering getting incisions wet 06/15/2023 VTE prophylaxis: Coumadin to be restarted per pharmacy, SCDs Impediments to Fracture Healing: Vitamin D level 7, started on supplementation -Continue 50,000 units Vit D supplementation weekly x 8 weeks Follow - up plan: 2 weeks for wound check and repeat x-rays   Anemia of operative blood loss Not yet  at transfusion threshold - hgb stabilized -Pt does have low iron. Will supplement with oral iron. Given dose of IV iron on 2/21 -Recommend recheck cbc in 2-4 weeks to ensure Hgb improves   HTN Continue usual metoprolol - holding losartan given post-op increased creatinine -Cr has returned to baseline, thus losartan resumed on d/c   Permanent atrial fibrillation on warfarin therapy Continue usual amiodarone and metoprolol - continue warfarin   CKD stage IIIb Baseline creatinine approximately 1.5 -ARB was held for Cr up to 1.73 -Cr improved to baseline   Constipation -Resolved -Please ensure regular bowel movements, providing cathartics as needed  DM2 w/ hyperglycemia  CBG improved with addition of long-acting insulin, would cont for now   Vitamin D deficiency Vitamin D level quite low at 7.46 - supplementation added   Obesity - Body mass index is 35.73 kg/m.      Consultants: Orthopedic Surgery Procedures performed: Cephalomedullary nailing of right intertrochanteric femur fracture   Disposition: Skilled nursing facility Diet recommendation:  Carb modified diet DISCHARGE MEDICATION: Allergies as of 06/17/2023       Reactions   Nystatin Other (See Comments)   unknown   Other Other (See Comments)   Product Containing 3-Hydroxy-3-Methylglutaryl-Coenzyme A Reductase Inhibitor (Product) unknown   Statins Other (See Comments)   Muscles aches,hurt        Medication List     STOP taking these medications    metFORMIN 1000 MG tablet Commonly known as: GLUCOPHAGE   nitrofurantoin 50 MG capsule Commonly known as: MACRODANTIN   Potassium Chloride ER 20 MEQ Tbcr       TAKE these medications    acetaminophen  325 MG tablet Commonly known as: TYLENOL Take 2 tablets (650 mg total) by mouth every 6 (six) hours as needed for mild pain (pain score 1-3) (or Fever >/= 101).   amiodarone 200 MG tablet Commonly known as: Pacerone Take 1 tablet (200 mg total) by mouth  daily. Amiodarone 200 mg BID x 3 weeks followed by amiodarone 200 mg once daily. What changed: additional instructions   docusate sodium 100 MG capsule Commonly known as: COLACE Take 1 capsule (100 mg total) by mouth 2 (two) times daily.   ezetimibe 10 MG tablet Commonly known as: ZETIA Take 10 mg by mouth every morning.   ferrous sulfate 325 (65 FE) MG tablet Take 1 tablet (325 mg total) by mouth daily with breakfast. Start taking on: June 18, 2023   insulin aspart 100 UNIT/ML FlexPen Commonly known as: NOVOLOG Inject 0-6 Units into the skin 3 (three) times daily with meals. Check Blood Glucose (BG) and inject per scale: BG <150= 0 unit; BG 150-200= 1 unit; BG 201-250= 2 unit; BG 251-300= 3 unit; BG 301-350= 4 unit; BG 351-400= 5 unit; BG >400= 6 unit and Call Primary Care.   insulin glargine 100 UNIT/ML Solostar Pen Commonly known as: LANTUS Inject 12 Units into the skin daily. May substitute as needed per insurance.   losartan 25 MG tablet Commonly known as: COZAAR Take 25 mg by mouth every morning.   magnesium oxide 400 (240 Mg) MG tablet Commonly known as: MAG-OX Take 400 mg by mouth 2 (two) times daily.   melatonin 1 MG Tabs tablet Take 4 mg by mouth at bedtime as needed (sleep). gummies   methocarbamol 500 MG tablet Commonly known as: ROBAXIN Take 1 tablet (500 mg total) by mouth every 8 (eight) hours as needed for muscle spasms.   metoprolol succinate 25 MG 24 hr tablet Commonly known as: TOPROL-XL Take 1 tablet (25 mg total) by mouth daily.   omeprazole 20 MG capsule Commonly known as: PRILOSEC Take 20 mg by mouth every morning.   oxyCODONE 5 MG immediate release tablet Commonly known as: Oxy IR/ROXICODONE Take 1 tablet (5 mg total) by mouth every 4 (four) hours as needed for severe pain (pain score 7-10).   polyethylene glycol 17 g packet Commonly known as: MIRALAX / GLYCOLAX Take 17 g by mouth daily. Start taking on: June 18, 2023   senna 8.6  MG Tabs tablet Commonly known as: SENOKOT Take 1 tablet (8.6 mg total) by mouth daily as needed for mild constipation.   Vitamin D (Ergocalciferol) 1.25 MG (50000 UNIT) Caps capsule Commonly known as: DRISDOL Take 1 capsule (50,000 Units total) by mouth every 7 (seven) days. Start taking on: June 20, 2023   warfarin 3 MG tablet Commonly known as: COUMADIN Take as directed. If you are unsure how to take this medication, talk to your nurse or doctor. Original instructions: Take 1/2 a tablet to 1 tablet by mouth daily as directed by the coumadin clinic. What changed:  how much to take how to take this when to take this additional instructions        Contact information for follow-up providers     [facility] Follow up.   Specialty: Home Health Services Why: Home health has been arranged. They will contact you to schedule apt within 48hrs post discharge. Contact information: [address] [place] [address] [phone]         Roby Lofts, MD. Schedule an appointment as soon as  possible for a visit in 2 week(s).   Specialty: Orthopedic Surgery Why: for wound check and repeat x-rays Contact information: [address] [place] [address] [phone]              Contact information for after-discharge care     Destination     [facility] .   Service: Skilled Nursing Contact information: [address] [place] [address] [phone]                    Discharge Exam: Ceasar Mons Weights   06/11/23 1333  Weight: 106.6 kg   General exam: Awake, laying in bed, in nad Respiratory system: Normal respiratory effort, no wheezing Cardiovascular system: regular rate, s1, s2 Gastrointestinal system: Soft, nondistended, positive BS Central nervous system: CN2-12 grossly intact, strength intact Extremities: Perfused, no clubbing Skin: Normal skin turgor, no notable skin  lesions seen Psychiatry: Mood normal // no visual hallucinations   Condition at discharge: fair  The results of significant diagnostics from this hospitalization (including imaging, microbiology, ancillary and laboratory) are listed below for reference.   Imaging Studies: DG FEMUR PORT, MIN 2 VIEWS RIGHT Result Date: 06/12/2023 CLINICAL DATA:  Postop. EXAM: RIGHT FEMUR PORTABLE 2 VIEW COMPARISON:  Preoperative imaging. FINDINGS: Long femoral intramedullary nail with trans trochanteric and distal locking screw fixation traverse comminuted proximal femur fracture. Mild persistent angulation and displacement of dominant fracture site. Recent postsurgical change includes air and edema in the soft tissues. IMPRESSION: ORIF proximal femur fracture without immediate postoperative complication. Electronically Signed   By: Narda Rutherford M.D.   On: 06/12/2023 13:21   DG FEMUR, MIN 2 VIEWS RIGHT Result Date: 06/12/2023 CLINICAL DATA:  Elective surgery EXAM: RIGHT FEMUR 2 VIEWS COMPARISON:  Preoperative imaging FINDINGS: Eight fluoroscopic spot views of the right femur submitted from the operating room. Femoral intramedullary nail with trans trochanteric and distal locking screw fixation traversing proximal femur fracture. Fluoroscopy time 1.37 minutes. Dose 20.76 mGy. IMPRESSION: Procedural fluoroscopy during proximal femur fracture ORIF. Electronically Signed   By: Narda Rutherford M.D.   On: 06/12/2023 13:21   DG C-Arm 1-60 Min-No Report Result Date: 06/12/2023 Fluoroscopy was utilized by the requesting physician.  No radiographic interpretation.   DG FEMUR, MIN 2 VIEWS RIGHT Result Date: 06/11/2023 CLINICAL DATA:  [id number] Hip fracture (HCC) [phone] EXAM: RIGHT FEMUR 2 VIEWS COMPARISON:  Same day radiograph FINDINGS: Comminuted inter trochanteric fracture with lateral displacement of the greater trochanter medial displacement of the lesser trochanter. Osteopenia. Status post knee arthroplasty. Orthopedic  hardware is intact and without periprosthetic fracture or lucency. Vascular calcifications. IMPRESSION: Comminuted intertrochanteric fracture. Electronically Signed   By: Meda Klinefelter M.D.   On: 06/11/2023 15:19   DG Chest Port 1 View Result Date: 06/11/2023 CLINICAL DATA:  sob, fall EXAM: PORTABLE CHEST 1 VIEW COMPARISON:  January 31, 2023 FINDINGS: The cardiomediastinal silhouette is unchanged and enlarged in contour.Status post median sternotomy and multiple valve replacement. LEFT chest cardiac pacing device. Atherosclerotic calcifications. No pleural effusion. No pneumothorax. No acute pleuroparenchymal abnormality. IMPRESSION: No acute cardiopulmonary abnormality. Electronically Signed   By: Meda Klinefelter M.D.   On: 06/11/2023 14:00   DG Pelvis Portable Result Date: 06/11/2023 CLINICAL DATA:  Fall EXAM: PORTABLE PELVIS 1-2 VIEWS COMPARISON:  None Available. FINDINGS: Osteopenia. Comminuted inter trochanteric fracture of the RIGHT proximal femur with mild to moderate displacement of the greater and lesser trochanter. RIGHT femoral head appears grossly aligned within the acetabulum. The sacrum is  obscured by overlapping bowel contents. Lucency along the LEFT proximal femoral head is favored to be summation artifact from overlapping osseous structures. Atherosclerotic calcifications. IMPRESSION: Comminuted intertrochanteric fracture of the RIGHT proximal femur. Electronically Signed   By: Meda Klinefelter M.D.   On: 06/11/2023 13:46    Microbiology: Results for orders placed or performed during the hospital encounter of 01/31/23  MRSA Next Gen by PCR, Nasal     Status: None   Collection Time: 01/31/23  4:14 PM   Specimen: Nasal Mucosa; Nasal Swab  Result Value Ref Range Status   MRSA by PCR Next Gen NOT DETECTED NOT DETECTED Final    Comment: (NOTE) The GeneXpert MRSA Assay (FDA approved for NASAL specimens only), is one component of a comprehensive MRSA colonization  surveillance program. It is not intended to diagnose MRSA infection nor to guide or monitor treatment for MRSA infections. Test performance is not FDA approved in patients less than 32 years old. Performed at [facility], [address] [place], [place] [address]     Labs: CBC: Recent Labs  Lab 06/11/23 1410 06/11/23 1411 06/13/23 0552 06/13/23 1345 06/14/23 0644 06/15/23 0552 06/16/23 0524  WBC 11.8*   < > 17.0* 17.8* 14.4* 12.5* 11.6*  NEUTROABS 9.9*  --   --   --   --   --   --   HGB 11.3*   < > 7.9* 7.7* 7.6* 7.6* 7.5*  HCT 35.9*   < > 24.2* 24.0* 23.5* 23.5* 22.9*  MCV 94.2   < > 92.0 92.3 92.9 93.6 91.6  PLT 225   < > 213 227 221 244 273   < > = values in this interval not displayed.   Basic Metabolic Panel: Recent Labs  Lab 06/13/23 0552 06/14/23 0644 06/15/23 0552 06/16/23 0524 06/17/23 0523  NA 134* 135 134* 136 137  K 4.5 4.5 5.1 4.7 4.7  CL 100 100 102 102 100  CO2 23 24 24 25 25   GLUCOSE 246* 165* 155* 140* 159*  BUN 30* 39* 37* 33* 31*  CREATININE 1.58* 1.73* 1.47* 1.39* 1.46*  CALCIUM 8.8* 8.6* 8.5* 8.8* 8.7*  MG 2.1  --   --   --   --    Liver Function Tests: Recent Labs  Lab 06/11/23 1410 06/16/23 0524 06/17/23 0523  AST 26 17 17   ALT 27 10 11   ALKPHOS 80 58 60  BILITOT 0.8 0.9 1.0  PROT 6.5 5.1* 5.0*  ALBUMIN 3.9 2.8* 2.6*   CBG: Recent Labs  Lab 06/16/23 0812 06/16/23 1302 06/16/23 1629 06/16/23 2042 06/17/23 0813  GLUCAP 220* 162* 218* 186* 224*    Discharge time spent: less than 30 minutes.  Signed: Rickey Barbara, MD [facility] 06/17/2023

## 2023-06-20 ENCOUNTER — Encounter (HOSPITAL_COMMUNITY): Payer: Self-pay

## 2023-06-20 ENCOUNTER — Other Ambulatory Visit: Payer: Self-pay

## 2023-06-20 DIAGNOSIS — S72001A Fracture of unspecified part of neck of right femur, initial encounter for closed fracture: Secondary | ICD-10-CM | POA: Diagnosis not present

## 2023-06-20 DIAGNOSIS — Z952 Presence of prosthetic heart valve: Secondary | ICD-10-CM

## 2023-06-20 DIAGNOSIS — R5381 Other malaise: Secondary | ICD-10-CM | POA: Diagnosis not present

## 2023-06-20 MED ORDER — WARFARIN SODIUM 3 MG PO TABS: ORAL_TABLET | ORAL | 1 refills | Status: DC

## 2023-06-20 NOTE — Progress Notes (Signed)
 Discharge Progress Report  Patient Details  Name: [name] MRN: [id number] Date of Birth: 06-09-1950 Referring Provider:   Flowsheet Row CARDIAC REHAB PHASE II ORIENTATION from 04/18/2023 in [facility] CARDIAC REH[facility]ILITATION  Referring Provider Luane School MD        Number of Visits: 11  Reason for Discharge:  Early Exit:  pt had a fall resulting in fractured hip. She is now in SNF for physical therapy.   Smoking History:  Social History   Tobacco Use  Smoking Status Former   Current packs/day: 0.00   Average packs/day: 0.1 packs/day for 7.0 years (0.7 ttl pk-yrs)   Types: Cigarettes   Start date: 09/16/1962   Quit date: 09/15/1969   Years since quitting: 53.7  Smokeless Tobacco Never  Tobacco Comments   as a teenager    Diagnosis:  S/P MVR (mitral valve replacement)  S/P AVR (aortic valve replacement)  ADL UCSD:   Initial Exercise Prescription:  Initial Exercise Prescription - 04/18/23 1400       Date of Initial Exercise RX and Referring Provider   Date 04/18/23    Referring Provider Luane School MD      Oxygen   Maintain Oxygen Saturation 88% or higher      NuStep   Level 1    SPM 60    Minutes 30    METs 1.5      Prescription Details   Frequency (times per week) 2    Duration Progress to 30 minutes of continuous aerobic without signs/symptoms of physical distress      Intensity   THRR 40-80% of Max Heartrate 96-129    Ratings of Perceived Exertion 11-13    Perceived Dyspnea 0-4      Progression   Progression Continue to progress workloads to maintain intensity without signs/symptoms of physical distress.      Resistance Training   Training Prescription Yes    Weight 2lb    Reps 10-15             Discharge Exercise Prescription (Final Exercise Prescription Changes):  Exercise Prescription Changes - 05/19/23 1400       Response to Exercise   Blood Pressure (Admit) 136/72    Blood Pressure (Exercise) 138/72     Blood Pressure (Exit) 122/60    Heart Rate (Admit) 55 bpm    Heart Rate (Exercise) 103 bpm    Heart Rate (Exit) 60 bpm    Rating of Perceived Exertion (Exercise) 12    Duration Continue with 30 min of aerobic exercise without signs/symptoms of physical distress.    Intensity THRR unchanged      Progression   Progression Continue to progress workloads to maintain intensity without signs/symptoms of physical distress.      Resistance Training   Training Prescription Yes    Weight 3    Reps 10-15      NuStep   Level 1    SPM 48    Minutes 30    METs 1.6             Functional Capacity:  6 Minute Walk     Row Name 04/18/23 1403         6 Minute Walk   Phase Initial     Distance 630 feet     Walk Time 4.43 minutes     # of Rest Breaks 2  1:04, 30sec     MPH 1.61     METS 0.96  RPE 15     VO2 Peak 3.38     Symptoms Yes (comment)     Comments fatigue, knee pain     Resting HR 63 bpm     Resting BP 128/74     Resting Oxygen Saturation  96 %     Exercise Oxygen Saturation  during 6 min walk 92 %     Max Ex. HR 83 bpm     Max Ex. BP 138/84     2 Minute Post BP 122/74              Nutrition & Weight - Outcomes:  Pre Biometrics - 04/18/23 1406       Pre Biometrics   Height 5\' 8"  (1.727 m)    Weight 229 lb 14.4 oz (104.3 kg)    Waist Circumference 45 inches    Hip Circumference 48.5 inches    Waist to Hip Ratio 0.93 %    BMI (Calculated) 34.96    Grip Strength 17.8 kg    Single Leg Stand 2.4 seconds              Nutrition:  Nutrition Therapy & Goals - 04/18/23 1411       Intervention Plan   Intervention Prescribe, educate and counsel regarding individualized specific dietary modifications aiming towards targeted core components such as weight, hypertension, lipid management, diabetes, heart failure and other comorbidities.;Nutrition handout(s) given to patient.    Expected Outcomes Short Term Goal: Understand basic principles of dietary  content, such as calories, fat, sodium, cholesterol and nutrients.;Long Term Goal: Adherence to prescribed nutrition plan.             Nutrition Discharge:   Education Questionnaire Score:  Knowledge Questionnaire Score - 04/18/23 1412       Knowledge Questionnaire Score   Pre Score 23/26             Goals reviewed with patient; copy given to patient.

## 2023-06-20 NOTE — Progress Notes (Cosign Needed Addendum)
 Cardiac Individual Treatment Plan  Patient Details  Name: [name] MRN: [id number] Date of Birth: Oct 04, 1950 Referring Provider:   Flowsheet Row CARDIAC REHAB PHASE II ORIENTATION from 04/18/2023 in [facility] CARDIAC REHABILITATION  Referring Provider Luane School MD       Initial Encounter Date:  Flowsheet Row CARDIAC REHAB PHASE II ORIENTATION from 04/18/2023 in [facility] CARDIAC REHABILITATION  Date 04/18/23       Visit Diagnosis: S/P MVR (mitral valve replacement)  S/P AVR (aortic valve replacement)  Patient's Home Medications on Admission:  Current Outpatient Medications:    acetaminophen (TYLENOL) 325 MG tablet, Take 2 tablets (650 mg total) by mouth every 6 (six) hours as needed for mild pain (pain score 1-3) (or Fever >/= 101)., Disp: , Rfl:    amiodarone (PACERONE) 200 MG tablet, Take 1 tablet (200 mg total) by mouth daily. Amiodarone 200 mg BID x 3 weeks followed by amiodarone 200 mg once daily. (Patient taking differently: Take 200 mg by mouth daily.), Disp: 90 tablet, Rfl: 3   docusate sodium (COLACE) 100 MG capsule, Take 1 capsule (100 mg total) by mouth 2 (two) times daily., Disp: 60 capsule, Rfl: 0   ezetimibe (ZETIA) 10 MG tablet, Take 10 mg by mouth every morning., Disp: , Rfl:    ferrous sulfate 325 (65 FE) MG tablet, Take 1 tablet (325 mg total) by mouth daily with breakfast., Disp: 30 tablet, Rfl: 0   insulin aspart (NOVOLOG) 100 UNIT/ML FlexPen, Inject 0-6 Units into the skin 3 (three) times daily with meals. Check Blood Glucose (BG) and inject per scale: BG <150= 0 unit; BG 150-200= 1 unit; BG 201-250= 2 unit; BG 251-300= 3 unit; BG 301-350= 4 unit; BG 351-400= 5 unit; BG >400= 6 unit and Call Primary Care., Disp: , Rfl:    insulin glargine (LANTUS) 100 UNIT/ML Solostar Pen, Inject 12 Units into the skin daily. May substitute as needed per insurance., Disp: , Rfl:    losartan (COZAAR) 25 MG tablet, Take 25 mg by mouth every morning., Disp: , Rfl:     magnesium oxide (MAG-OX) 400 (240 Mg) MG tablet, Take 400 mg by mouth 2 (two) times daily., Disp: , Rfl:    melatonin 1 MG TABS tablet, Take 4 mg by mouth at bedtime as needed (sleep). gummies, Disp: , Rfl:    methocarbamol (ROBAXIN) 500 MG tablet, Take 1 tablet (500 mg total) by mouth every 8 (eight) hours as needed for muscle spasms., Disp: 30 tablet, Rfl: 0   metoprolol succinate (TOPROL-XL) 25 MG 24 hr tablet, Take 1 tablet (25 mg total) by mouth daily., Disp: 30 tablet, Rfl: 2   omeprazole (PRILOSEC) 20 MG capsule, Take 20 mg by mouth every morning., Disp: , Rfl: 3   oxyCODONE (OXY IR/ROXICODONE) 5 MG immediate release tablet, Take 1 tablet (5 mg total) by mouth every 4 (four) hours as needed for severe pain (pain score 7-10)., Disp: 42 tablet, Rfl: 0   polyethylene glycol (MIRALAX / GLYCOLAX) 17 g packet, Take 17 g by mouth daily., Disp: 14 each, Rfl: 0   senna (SENOKOT) 8.6 MG TABS tablet, Take 1 tablet (8.6 mg total) by mouth daily as needed for mild constipation., Disp: 30 tablet, Rfl: 0   Vitamin D, Ergocalciferol, (DRISDOL) 1.25 MG (50000 UNIT) CAPS capsule, Take 1 capsule (50,000 Units total) by mouth every 7 (seven) days., Disp: 5 capsule, Rfl: 0   warfarin (COUMADIN) 3 MG tablet, Take 1/2 a tablet to 1 tablet by mouth daily  as directed by the coumadin clinic. (Patient taking differently: Take 1.5-3 mg by mouth See admin instructions. Take 1.5 mg (one-half tablet) on Saturdays and Tuesdays Only. Take 3mg  (one tablet) on all other days.), Disp: 30 tablet, Rfl: 0  Past Medical History: Past Medical History:  Diagnosis Date   Aortic stenosis    21 mm Inspiris Edwards AVR June 2024 - [facility]   Arthritis    Cervical incompetence    GERD (gastroesophageal reflux disease)    Hay fever    Heart block    [employer] DCPPM on 10/26/2022 with Dr. Zachery Conch - [facility]   Hemochromatosis    HOCM (hypertrophic obstructive cardiomyopathy) (HCC)    Septal myomectomy June 2024 - [facility]   Hypertension     Migraines    Mitral stenosis    25 mm [employer] mechanical MVR June 2024 - [facility]   Paroxysmal atrial fibrillation (HCC)    Sciatica    Type 2 diabetes mellitus (HCC)     Tobacco Use: Social History   Tobacco Use  Smoking Status Former   Current packs/day: 0.00   Average packs/day: 0.1 packs/day for 7.0 years (0.7 ttl pk-yrs)   Types: Cigarettes   Start date: 09/16/1962   Quit date: 09/15/1969   Years since quitting: 53.7  Smokeless Tobacco Never  Tobacco Comments   as a teenager    Labs: Review Flowsheet       Latest Ref Rng & Units 07/28/2020 10/01/2021 01/31/2023 06/11/2023  Labs for ITP Cardiac and Pulmonary Rehab  Hemoglobin A1c 4.8 - 5.6 % - 6.8  7.5  -  Bicarbonate 20.0 - 28.0 mmol/L 27.7  - - -  TCO2 22 - 32 mmol/L 29  - - 23   O2 Saturation % 75.0  - - -    Capillary Blood Glucose: Lab Results  Component Value Date   GLUCAP 185 (H) 06/17/2023   GLUCAP 239 (H) 06/17/2023   GLUCAP 224 (H) 06/17/2023   GLUCAP 186 (H) 06/16/2023   GLUCAP 218 (H) 06/16/2023     Exercise Target Goals: Exercise Program Goal: Individual exercise prescription set using results from initial 6 min walk test and THRR while considering  patient's activity barriers and safety.   Exercise Prescription Goal: Starting with aerobic activity 30 plus minutes a day, 3 days per week for initial exercise prescription. Provide home exercise prescription and guidelines that participant acknowledges understanding prior to discharge.  Activity Barriers & Risk Stratification:  Activity Barriers & Cardiac Risk Stratification - 04/18/23 1241       Activity Barriers & Cardiac Risk Stratification   Activity Barriers Balance Concerns;History of Falls;Assistive Device;Deconditioning;Muscular Weakness;Shortness of Breath;Left Knee Replacement;Other (comment)    Comments ringing in her ears is disorienting    Cardiac Risk Stratification High             6 Minute Walk:  6 Minute Walk     Row Name  04/18/23 1403         6 Minute Walk   Phase Initial     Distance 630 feet     Walk Time 4.43 minutes     # of Rest Breaks 2  1:04, 30sec     MPH 1.61     METS 0.96     RPE 15     VO2 Peak 3.38     Symptoms Yes (comment)     Comments fatigue, knee pain     Resting HR 63 bpm     Resting BP 128/74  Resting Oxygen Saturation  96 %     Exercise Oxygen Saturation  during 6 min walk 92 %     Max Ex. HR 83 bpm     Max Ex. BP 138/84     2 Minute Post BP 122/74              Oxygen Initial Assessment:   Oxygen Re-Evaluation:   Oxygen Discharge (Final Oxygen Re-Evaluation):   Initial Exercise Prescription:  Initial Exercise Prescription - 04/18/23 1400       Date of Initial Exercise RX and Referring Provider   Date 04/18/23    Referring Provider Luane School MD      Oxygen   Maintain Oxygen Saturation 88% or higher      NuStep   Level 1    SPM 60    Minutes 30    METs 1.5      Prescription Details   Frequency (times per week) 2    Duration Progress to 30 minutes of continuous aerobic without signs/symptoms of physical distress      Intensity   THRR 40-80% of Max Heartrate 96-129    Ratings of Perceived Exertion 11-13    Perceived Dyspnea 0-4      Progression   Progression Continue to progress workloads to maintain intensity without signs/symptoms of physical distress.      Resistance Training   Training Prescription Yes    Weight 2lb    Reps 10-15             Perform Capillary Blood Glucose checks as needed.  Exercise Prescription Changes:   Exercise Prescription Changes     Row Name 04/18/23 1400 04/28/23 1200 05/19/23 1400         Response to Exercise   Blood Pressure (Admit) 128/74 142/70 136/72     Blood Pressure (Exercise) 138/84 125/55 138/72     Blood Pressure (Exit) 122/74 140/72 122/60     Heart Rate (Admit) 63 bpm 57 bpm 55 bpm     Heart Rate (Exercise) 83 bpm 104 bpm 103 bpm     Heart Rate (Exit) 63 bpm 64 bpm 60  bpm     Oxygen Saturation (Admit) 96 % -- --     Oxygen Saturation (Exercise) 92 % -- --     Rating of Perceived Exertion (Exercise) 15 13 12      Symptoms fatigue, chronic knee pain -- --     Comments walk test results -- --     Duration -- Continue with 30 min of aerobic exercise without signs/symptoms of physical distress. Continue with 30 min of aerobic exercise without signs/symptoms of physical distress.     Intensity -- THRR unchanged THRR unchanged       Progression   Progression -- Continue to progress workloads to maintain intensity without signs/symptoms of physical distress. Continue to progress workloads to maintain intensity without signs/symptoms of physical distress.       Resistance Training   Training Prescription -- Yes Yes     Weight -- 2 lbs 3     Reps -- 10-15 10-15       NuStep   Level -- 1 1     SPM -- 51 48     Minutes -- 30 30     METs -- 1.7 1.6              Exercise Comments:   Exercise Goals and Review:   Exercise Goals     Row Name  04/18/23 1405             Exercise Goals   Increase Physical Activity Yes       Intervention Provide advice, education, support and counseling about physical activity/exercise needs.;Develop an individualized exercise prescription for aerobic and resistive training based on initial evaluation findings, risk stratification, comorbidities and participant's personal goals.       Expected Outcomes Short Term: Attend rehab on a regular basis to increase amount of physical activity.;Long Term: Add in home exercise to make exercise part of routine and to increase amount of physical activity.;Long Term: Exercising regularly at least 3-5 days a week.       Increase Strength and Stamina Yes       Intervention Provide advice, education, support and counseling about physical activity/exercise needs.;Develop an individualized exercise prescription for aerobic and resistive training based on initial evaluation findings, risk  stratification, comorbidities and participant's personal goals.       Expected Outcomes Short Term: Increase workloads from initial exercise prescription for resistance, speed, and METs.;Short Term: Perform resistance training exercises routinely during rehab and add in resistance training at home;Long Term: Improve cardiorespiratory fitness, muscular endurance and strength as measured by increased METs and functional capacity ( )       Able to understand and use rate of perceived exertion (RPE) scale Yes       Intervention Provide education and explanation on how to use RPE scale       Expected Outcomes Short Term: Able to use RPE daily in rehab to express subjective intensity level;Long Term:  Able to use RPE to guide intensity level when exercising independently       Able to understand and use Dyspnea scale Yes       Intervention Provide education and explanation on how to use Dyspnea scale       Expected Outcomes Short Term: Able to use Dyspnea scale daily in rehab to express subjective sense of shortness of breath during exertion;Long Term: Able to use Dyspnea scale to guide intensity level when exercising independently       Knowledge and understanding of Target Heart Rate Range (THRR) Yes       Intervention Provide education and explanation of THRR including how the numbers were predicted and where they are located for reference       Expected Outcomes Long Term: Able to use THRR to govern intensity when exercising independently;Short Term: Able to state/look up THRR;Short Term: Able to use daily as guideline for intensity in rehab       Able to check pulse independently Yes       Intervention Review the importance of being able to check your own pulse for safety during independent exercise;Provide education and demonstration on how to check pulse in carotid and radial arteries.       Expected Outcomes Short Term: Able to explain why pulse checking is important during independent  exercise;Long Term: Able to check pulse independently and accurately       Understanding of Exercise Prescription Yes       Intervention Provide education, explanation, and written materials on patient's individual exercise prescription       Expected Outcomes Short Term: Able to explain program exercise prescription;Long Term: Able to explain home exercise prescription to exercise independently                Exercise Goals Re-Evaluation :  Exercise Goals Re-Evaluation     Row Name 05/02/23 [phone] 05/05/23 1341  Exercise Goal Re-Evaluation   Exercise Goals Review Increase Physical Activity;Increase Strength and Stamina;Understanding of Exercise Prescription Increase Physical Activity;Increase Strength and Stamina;Understanding of Exercise Prescription      Comments Stephane is just starting rehab, she has been here for 2 visits. She is tolerating exercise and getting use to the equipment. Will continue to montior and progress as able. Meiah is doing well in rehab. She has just started the program so she is gettting use to exercising. She is feeling sore after each visit the day after but it is getting better. Since she has only been her for 3 visits she has not felt any increase in stamina yet. She is enjoying coming to class and likes that she is able to exercise.      Expected Outcomes Continue to attend rehab short: increase speed on nustep  long term: continue to attend exercise                Discharge Exercise Prescription (Final Exercise Prescription Changes):  Exercise Prescription Changes - 05/19/23 1400       Response to Exercise   Blood Pressure (Admit) 136/72    Blood Pressure (Exercise) 138/72    Blood Pressure (Exit) 122/60    Heart Rate (Admit) 55 bpm    Heart Rate (Exercise) 103 bpm    Heart Rate (Exit) 60 bpm    Rating of Perceived Exertion (Exercise) 12    Duration Continue with 30 min of aerobic exercise without signs/symptoms of physical distress.     Intensity THRR unchanged      Progression   Progression Continue to progress workloads to maintain intensity without signs/symptoms of physical distress.      Resistance Training   Training Prescription Yes    Weight 3    Reps 10-15      NuStep   Level 1    SPM 48    Minutes 30    METs 1.6             Nutrition:  Target Goals: Understanding of nutrition guidelines, daily intake of sodium 1500mg , cholesterol 200mg , calories 30% from fat and 7% or less from saturated fats, daily to have 5 or more servings of fruits and vegetables.  Biometrics:  Pre Biometrics - 04/18/23 1406       Pre Biometrics   Height 5\' 8"  (1.727 m)    Weight 229 lb 14.4 oz (104.3 kg)    Waist Circumference 45 inches    Hip Circumference 48.5 inches    Waist to Hip Ratio 0.93 %    BMI (Calculated) 34.96    Grip Strength 17.8 kg    Single Leg Stand 2.4 seconds              Nutrition Therapy Plan and Nutrition Goals:  Nutrition Therapy & Goals - 04/18/23 1411       Intervention Plan   Intervention Prescribe, educate and counsel regarding individualized specific dietary modifications aiming towards targeted core components such as weight, hypertension, lipid management, diabetes, heart failure and other comorbidities.;Nutrition handout(s) given to patient.    Expected Outcomes Short Term Goal: Understand basic principles of dietary content, such as calories, fat, sodium, cholesterol and nutrients.;Long Term Goal: Adherence to prescribed nutrition plan.             Nutrition Assessments:  MEDIFICTS Score Key: >=70 Need to make dietary changes  40-70 Heart Healthy Diet <= 40 Therapeutic Level Cholesterol Diet  Flowsheet Row CARDIAC REHAB PHASE II ORIENTATION from  04/18/2023 in [facility] CARDIAC REHABILITATION  Picture Your Plate Total Score on Admission 56      Picture Your Plate Scores: <29 Unhealthy dietary pattern with much room for improvement. 41-50 Dietary pattern  unlikely to meet recommendations for good health and room for improvement. 51-60 More healthful dietary pattern, with some room for improvement.  >60 Healthy dietary pattern, although there may be some specific behaviors that could be improved.    Nutrition Goals Re-Evaluation:  Nutrition Goals Re-Evaluation     Row Name 05/05/23 1347             Goals   Current Weight 224 lb (101.6 kg)       Nutrition Goal Healthy eating       Comment Caridad is doing well in rehab. IN the morinngd she eats ceraol or oatmeal, lunch is normally tomatoa and some cheese. Dinner she is eating seafood  (whiting, cod, scallops and shrimp). She does try to eat more chicken then beef. When she gets beef she is always getting lean cuts or lean groung. She has cut down on sweets. She did eat more sweets then normal during the holidays. She drinks water or unsweetned tea at home. She will have a soda everynow and then when she is out. She is trying to be mindful of portion control and also snacking. She also does not eat bread a lot.       Expected Outcome Short: watch portion control    long term: continue to eat and pick healthy options                Nutrition Goals Discharge (Final Nutrition Goals Re-Evaluation):  Nutrition Goals Re-Evaluation - 05/05/23 1347       Goals   Current Weight 224 lb (101.6 kg)    Nutrition Goal Healthy eating    Comment Kissy is doing well in rehab. IN the morinngd she eats ceraol or oatmeal, lunch is normally tomatoa and some cheese. Dinner she is eating seafood  (whiting, cod, scallops and shrimp). She does try to eat more chicken then beef. When she gets beef she is always getting lean cuts or lean groung. She has cut down on sweets. She did eat more sweets then normal during the holidays. She drinks water or unsweetned tea at home. She will have a soda everynow and then when she is out. She is trying to be mindful of portion control and also snacking. She also does not eat  bread a lot.    Expected Outcome Short: watch portion control    long term: continue to eat and pick healthy options             Psychosocial: Target Goals: Acknowledge presence or absence of significant depression and/or stress, maximize coping skills, provide positive support system. Participant is able to verbalize types and ability to use techniques and skills needed for reducing stress and depression.  Initial Review & Psychosocial Screening:  Initial Psych Review & Screening - 04/18/23 1244       Initial Review   Current issues with Current Stress Concerns    Source of Stress Concerns Chronic Illness;Unable to participate in former interests or hobbies;Unable to perform yard/household activities    Comments unable to do everything she wants, politics is scary, concerned for her future      Family Dynamics   Good Support System? Yes   husband, neighbors, family lives in [place]     Barriers   Psychosocial barriers to  participate in program Psychosocial barriers identified (see note);The patient should benefit from training in stress management and relaxation.      Screening Interventions   Interventions Encouraged to exercise;Provide feedback about the scores to participant    Expected Outcomes Short Term goal: Utilizing psychosocial counselor, staff and physician to assist with identification of specific Stressors or current issues interfering with healing process. Setting desired goal for each stressor or current issue identified.;Long Term Goal: Stressors or current issues are controlled or eliminated.;Short Term goal: Identification and review with participant of any Quality of Life or Depression concerns found by scoring the questionnaire.;Long Term goal: The participant improves quality of Life and PHQ9 Scores as seen by post scores and/or verbalization of changes             Quality of Life Scores:  Quality of Life - 04/18/23 1411       Quality of Life   Select  Quality of Life      Quality of Life Scores   Health/Function Pre 22.1 %    Socioeconomic Pre 26.25 %    Psych/Spiritual Pre 25.5 %    Family Pre 23.11 %    GLOBAL Pre 23.91 %            Scores of 19 and below usually indicate a poorer quality of life in these areas.  A difference of  2-3 points is a clinically meaningful difference.  A difference of 2-3 points in the total score of the Quality of Life Index has been associated with significant improvement in overall quality of life, self-image, physical symptoms, and general health in studies assessing change in quality of life.  PHQ-9: Review Flowsheet       04/18/2023  Depression screen PHQ 2/9  Decreased Interest 0  Down, Depressed, Hopeless 0  PHQ - 2 Score 0  Altered sleeping 0  Tired, decreased energy 1  Change in appetite 0  Feeling bad or failure about yourself  0  Trouble concentrating 2  Moving slowly or fidgety/restless 0  Suicidal thoughts 0  PHQ-9 Score 3  Difficult doing work/chores Somewhat difficult   Interpretation of Total Score  Total Score Depression Severity:  1-4 = Minimal depression, 5-9 = Mild depression, 10-14 = Moderate depression, 15-19 = Moderately severe depression, 20-27 = Severe depression   Psychosocial Evaluation and Intervention:  Psychosocial Evaluation - 04/18/23 1406       Psychosocial Evaluation & Interventions   Interventions Encouraged to exercise with the program and follow exercise prescription;Stress management education    Comments Jenisa is coming into cardiac rehab after two valve replacements.  She has had a rough go since her surgery.  She also has gotten a pacemaker and a couple of cardioversions.  She was in and out of afib after surgery but she is now in rhythm and feeling better.  She is eager to get going with rehab and wants to be able to go and do again.  She would like to move better and build up her confidence to get out and about.  Since surgery, she has let fear  hold her back as she does not want to get herself in trouble.  She sleeps well, but has gotten into a bad habit of staying up late watching TV and then sleeping til 9am.  She does not preceive any barriers to attending rehab.  She enjoys going to the movies and getting out for a drive.    Expected Outcomes Short: Attend rehab to  build stamina Long: Build confidence that she can do things.    Continue Psychosocial Services  Follow up required by staff             Psychosocial Re-Evaluation:  Psychosocial Re-Evaluation     Row Name 05/05/23 1344             Psychosocial Re-Evaluation   Current issues with Current Sleep Concerns       Comments Kennetha is doing well in rehab. She stated that she does wake up early in the morning due to her dogs waking her up and then she does go back to bed. She does not wake up to use the restroom or any other issues. She stated that she does not have any stress concerns in her life and is doing well       Expected Outcomes short: make sure she is getting addiquet sleep   long term: continue to have no stress       Interventions Encouraged to attend Pulmonary Rehabilitation for the exercise       Continue Psychosocial Services  Follow up required by staff                Psychosocial Discharge (Final Psychosocial Re-Evaluation):  Psychosocial Re-Evaluation - 05/05/23 1344       Psychosocial Re-Evaluation   Current issues with Current Sleep Concerns    Comments Juanell is doing well in rehab. She stated that she does wake up early in the morning due to her dogs waking her up and then she does go back to bed. She does not wake up to use the restroom or any other issues. She stated that she does not have any stress concerns in her life and is doing well    Expected Outcomes short: make sure she is getting addiquet sleep   long term: continue to have no stress    Interventions Encouraged to attend Pulmonary Rehabilitation for the exercise    Continue  Psychosocial Services  Follow up required by staff             Vocational Rehabilitation: Provide vocational rehab assistance to qualifying candidates.   Vocational Rehab Evaluation & Intervention:  Vocational Rehab - 04/18/23 1242       Initial Vocational Rehab Evaluation & Intervention   Assessment shows need for Vocational Rehabilitation No   retired            Education: Education Goals: Education classes will be provided on a weekly basis, covering required topics. Participant will state understanding/return demonstration of topics presented.  Learning Barriers/Preferences:  Learning Barriers/Preferences - 04/18/23 1239       Learning Barriers/Preferences   Learning Barriers Sight   glasses   Learning Preferences Skilled Demonstration;Written Material             Education Topics: Hypertension, Hypertension Reduction -Define heart disease and high blood pressure. Discus how high blood pressure affects the body and ways to reduce high blood pressure.   Exercise and Your Heart -Discuss why it is important to exercise, the FITT principles of exercise, normal and abnormal responses to exercise, and how to exercise safely.   Angina -Discuss definition of angina, causes of angina, treatment of angina, and how to decrease risk of having angina.   Cardiac Medications -Review what the following cardiac medications are used for, how they affect the body, and side effects that may occur when taking the medications.  Medications include Aspirin, Beta blockers, calcium channel blockers, ACE Inhibitors,  angiotensin receptor blockers, diuretics, digoxin, and antihyperlipidemics. Flowsheet Row CARDIAC REHAB PHASE II EXERCISE from 05/26/2023 in [place] CARDIAC REHABILITATION  Date 05/19/23  Educator HB  Instruction Review Code 1- Verbalizes Understanding       Congestive Heart Failure -Discuss the definition of CHF, how to live with CHF, the signs and symptoms  of CHF, and how keep track of weight and sodium intake.   Heart Disease and Intimacy -Discus the effect sexual activity has on the heart, how changes occur during intimacy as we age, and safety during sexual activity.   Smoking Cessation / COPD -Discuss different methods to quit smoking, the health benefits of quitting smoking, and the definition of COPD.   Nutrition I: Fats -Discuss the types of cholesterol, what cholesterol does to the heart, and how cholesterol levels can be controlled.   Nutrition II: Labels -Discuss the different components of food labels and how to read food label   Heart Parts/Heart Disease and PAD -Discuss the anatomy of the heart, the pathway of blood circulation through the heart, and these are affected by heart disease.   Stress I: Signs and Symptoms -Discuss the causes of stress, how stress may lead to anxiety and depression, and ways to limit stress. Flowsheet Row CARDIAC REHAB PHASE II EXERCISE from 05/26/2023 in [place] CARDIAC REHABILITATION  Date 05/05/23  Educator [facility]  Instruction Review Code 1- Verbalizes Understanding       Stress II: Relaxation -Discuss different types of relaxation techniques to limit stress.   Warning Signs of Stroke / TIA -Discuss definition of a stroke, what the signs and symptoms are of a stroke, and how to identify when someone is having stroke.   Knowledge Questionnaire Score:  Knowledge Questionnaire Score - 04/18/23 1412       Knowledge Questionnaire Score   Pre Score 23/26             Core Components/Risk Factors/Patient Goals at Admission:  Personal Goals and Risk Factors at Admission - 04/18/23 1412       Core Components/Risk Factors/Patient Goals on Admission    Weight Management Yes;Obesity;Weight Loss    Intervention Weight Management: Develop a combined nutrition and exercise program designed to reach desired caloric intake, while maintaining appropriate intake of nutrient and fiber,  sodium and fats, and appropriate energy expenditure required for the weight goal.;Weight Management: Provide education and appropriate resources to help participant work on and attain dietary goals.;Weight Management/Obesity: Establish reasonable short term and long term weight goals.;Obesity: Provide education and appropriate resources to help participant work on and attain dietary goals.    Admit Weight 229 lb 14.4 oz (104.3 kg)    Goal Weight: Short Term 225 lb (102.1 kg)    Goal Weight: Long Term 220 lb (99.8 kg)    Expected Outcomes Short Term: Continue to assess and modify interventions until short term weight is achieved;Long Term: Adherence to nutrition and physical activity/exercise program aimed toward attainment of established weight goal;Weight Loss: Understanding of general recommendations for a balanced deficit meal plan, which promotes 1-2 lb weight loss per week and includes a negative energy balance of [phone] kcal/d;Understanding recommendations for meals to include 15-35% energy as protein, 25-35% energy from fat, 35-60% energy from carbohydrates, less than 200mg  of dietary cholesterol, 20-35 gm of total fiber daily;Understanding of distribution of calorie intake throughout the day with the consumption of 4-5 meals/snacks    Improve shortness of breath with ADL's Yes    Intervention Provide education, individualized exercise plan  and daily activity instruction to help decrease symptoms of SOB with activities of daily living.    Expected Outcomes Short Term: Improve cardiorespiratory fitness to achieve a reduction of symptoms when performing ADLs;Long Term: Be able to perform more ADLs without symptoms or delay the onset of symptoms    Diabetes Yes    Intervention Provide education about signs/symptoms and action to take for hypo/hyperglycemia.;Provide education about proper nutrition, including hydration, and aerobic/resistive exercise prescription along with prescribed medications to  achieve blood glucose in normal ranges: Fasting glucose 65-99 mg/dL    Expected Outcomes Short Term: Participant verbalizes understanding of the signs/symptoms and immediate care of hyper/hypoglycemia, proper foot care and importance of medication, aerobic/resistive exercise and nutrition plan for blood glucose control.;Long Term: Attainment of HbA1C < 7%.    Hypertension Yes    Intervention Provide education on lifestyle modifcations including regular physical activity/exercise, weight management, moderate sodium restriction and increased consumption of fresh fruit, vegetables, and low fat dairy, alcohol moderation, and smoking cessation.;Monitor prescription use compliance.    Expected Outcomes Short Term: Continued assessment and intervention until BP is < 140/54mm HG in hypertensive participants. < 130/38mm HG in hypertensive participants with diabetes, heart failure or chronic kidney disease.;Long Term: Maintenance of blood pressure at goal levels.             Core Components/Risk Factors/Patient Goals Review:   Goals and Risk Factor Review     Row Name 05/05/23 1354 05/05/23 1355           Core Components/Risk Factors/Patient Goals Review   Personal Goals Review Weight Management/Obesity;Improve shortness of breath with ADL's;Diabetes Weight Management/Obesity;Improve shortness of breath with ADL's;Diabetes      Review -- Payzlee has been doing well in rehab. She has noticed her SOB has gotten a little better. She does not check her sugar at home we have been checking it the first visits and it has been WNL. Her weight has been stable.      Expected Outcomes -- Short: monior weight everyday  long term: exercise and eat healthy for weight managmenet               Core Components/Risk Factors/Patient Goals at Discharge (Final Review):   Goals and Risk Factor Review - 05/05/23 1355       Core Components/Risk Factors/Patient Goals Review   Personal Goals Review Weight  Management/Obesity;Improve shortness of breath with ADL's;Diabetes    Review Cortnee has been doing well in rehab. She has noticed her SOB has gotten a little better. She does not check her sugar at home we have been checking it the first visits and it has been WNL. Her weight has been stable.    Expected Outcomes Short: monior weight everyday  long term: exercise and eat healthy for weight managmenet             ITP Comments:  ITP Comments     Row Name 04/18/23 1402 04/26/23 1337 04/26/23 1434 05/25/23 1137 06/20/23 0752   ITP Comments Patient attend orientation today.  Patient is attending Cardiac Rehabilitation Program.  Documentation for diagnosis can be found in [facility] 11/10/22.  Reviewed medical chart, RPE/RPD, gym safety, and program guidelines.  Patient was fitted to equipment they will be using during rehab.  Patient is scheduled to start exercise on Tuesday 04/26/23 at 1330.   Initial ITP created and sent for review and signature by Dr. Dina Rich, Medical Director for Cardiac Rehabilitation Program. First full day of exercise!  Patient  was oriented to gym and equipment including functions, settings, policies, and procedures.  Patient's individual exercise prescription and treatment plan were reviewed.  All starting workloads were established based on the results of the 6 minute walk test done at initial orientation visit.  The plan for exercise progression was also introduced and progression will be customized based on patient's performance and goals. 30 day review completed. ITP sent to Dr. Dina Rich, Medical Director of Cardiac Rehab. Continue with ITP unless changes are made by physician.  New to program, just finished her first exercise session today. 30 day review completed. ITP sent to Dr. Dina Rich, Medical Director of Cardiac Rehab. Continue with ITP unless changes are made by physician. [place]ath had a fall and fractured her hip. She has been transferred to SNF for inpatient  physcial therapy. Will be discharging her.            Comments: Discharge ITP

## 2023-06-21 ENCOUNTER — Encounter (HOSPITAL_COMMUNITY): Payer: Medicare Other

## 2023-06-21 NOTE — Progress Notes (Deleted)
 CARDIOLOGY CONSULT NOTE       Patient ID: [name] MRN: [id number] DOB/AGE: 09-30-1950 73 y.o.   Referring Physician: Phillips Odor Primary Physician: Assunta Found, MD Primary Cardiologist: Eden Emms Reason for Consultation: PVC/Murmur    HPI:  73 y.o. new to me last seen by Dr Purvis Sheffield in 2019 History of symptomatic PVCls, HTN and murmur with TTE showing basal septal Hypertrophy EF 65-70% abnormal relaxation AV sclerosis with mild AR Report indicates only chordal SAM with no significant resting gradient She has never had a f/u cardiac MRI She is on statin for HLD Monitor in 2017 reviewed only isolated PVCls no NSVT Do not see that any stress testing has been performed Not on beta blocker   No high risk family history of HOCM, syncope or arrhythmias  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  Cath Dr Excell Seltzer sowed no significant CAD with 3 good septal perforators That could be used for alcohol septal ablation Pull back gradient was negligible But post PVC gradient was 81 mmHg AV suggesting primary physiologic issue Is HOCM   Not thought to be a primary initial TAVR candidate due To sub aortic gradient Seen at [facility] by Dr Regino Schultze Meds adjusted   MRI done at [facility] showed ASH septal thickness 2 cm EF 69% AVA 1.9 cm2  Moderate AS with 3.5 m/sec LVOT gradient gad uptake in septum consistent With HOCM   Shared decision making discussed Medical Rx due to minimal symptoms escalate with disopyramide or mavacamten AVR/septal myectomy surgery Alcohol septal ablation and TAVR  Holter done in July with PVCls not quantitated and only one run NSVT 14 beats   She has a diagnosis of hemachromatosis. Had seen heme onc in past and had 3 months of phlebotomy   Ultimately had surgery at [facility] June 2024 with septal myomectomy, AVR 21 mm Inspiris, 25 mm Regent mechanical MVR and [employer] PPM Had some post operative PAF with [facility] on 02/02/23 . She is on coumadin and amiodarone Has seen EP Dr Ladona Ridgel  02/08/23  TEE 02/02/23 with normal EF 60-65% mild MR mechanical valve, moderate TR normal bioprosthetic AVR. No LAA thrombus  Admitted to hospital 05/2022 with mechanical fall Had surgical repair of right femur fracture with d/c to SNF  ***    ROS All other systems reviewed and negative except as noted above  Past Medical History:  Diagnosis Date   Aortic stenosis    21 mm Inspiris Edwards AVR June 2024 - [facility]   Arthritis    Cervical incompetence    GERD (gastroesophageal reflux disease)    Hay fever    Heart block    [employer] DCPPM on 10/26/2022 with Dr. Zachery Conch - [facility]   Hemochromatosis    HOCM (hypertrophic obstructive cardiomyopathy) (HCC)    Septal myomectomy June 2024 - [facility]   Hypertension    Migraines    Mitral stenosis    25 mm Regent mechanical MVR June 2024 - [facility]   Paroxysmal atrial fibrillation (HCC)    Sciatica    Type 2 diabetes mellitus (HCC)     Family History  Problem Relation Age of Onset   Heart attack Paternal Grandfather    Migraines Maternal Grandmother    Heart attack Maternal Grandfather    Heart attack Father    Heart murmur Father    Cancer Mother        pancreatic cancer   Heart murmur Brother    Breast cancer Maternal Aunt  Social History   Socioeconomic History   Marital status: Married    Spouse name: Not on file   Number of children: Not on file   Years of education: Not on file   Highest education level: Not on file  Occupational History   Not on file  Tobacco Use   Smoking status: Former    Current packs/day: 0.00    Average packs/day: 0.1 packs/day for 7.0 years (0.7 ttl pk-yrs)    Types: Cigarettes    Start date: 09/16/1962    Quit date: 09/15/1969    Years since quitting: 53.8   Smokeless tobacco: Never   Tobacco comments:    as a teenager  Vaping Use   Vaping status: Never Used  Substance and Sexual Activity   Alcohol use: Not Currently   Drug use: No   Sexual activity: Not Currently    Partners: Female     Birth control/protection: Post-menopausal    Comment: BTL  Other Topics Concern   Not on file  Social History Narrative   Not on file   Social Drivers of Health   Financial Resource Strain: Not on file  Food Insecurity: No Food Insecurity (06/12/2023)   Hunger Vital Sign    Worried About Running Out of Food in the Last Year: Never true    Ran Out of Food in the Last Year: Never true  Transportation Needs: No Transportation Needs (06/12/2023)   PRAPARE - Administrator, Civil Service (Medical): No    Lack of Transportation (Non-Medical): No  Physical Activity: Not on file  Stress: Not on file  Social Connections: Socially Integrated (06/12/2023)   Social Connection and Isolation Panel [NHANES]    Frequency of Communication with Friends and Family: More than three times a week    Frequency of Social Gatherings with Friends and Family: Twice a week    Attends Religious Services: 1 to 4 times per year    Active Member of [employer] or Organizations: Yes    Attends Banker Meetings: 1 to 4 times per year    Marital Status: Married  Catering manager Violence: Not At Risk (06/12/2023)   Humiliation, Afraid, Rape, and Kick questionnaire    Fear of Current or Ex-Partner: No    Emotionally Abused: No    Physically Abused: No    Sexually Abused: No    Past Surgical History:  Procedure Laterality Date   CARDIAC SURGERY     CARDIOVERSION N/A 02/02/2023   Procedure: CARDIOVERSION;  Surgeon: Marjo Bicker, MD;  Location: [facility] ORS;  Service: Cardiovascular;  Laterality: N/A;   CARPAL TUNNEL RELEASE Right    COLONOSCOPY N/A 08/01/2014   Procedure: COLONOSCOPY;  Surgeon: Malissa Hippo, MD;  Location: [facility] ENDO SUITE;  Service: Endoscopy;  Laterality: N/A;  900 -- moved to 10:00 - Ann notified pt   ELBOW SURGERY     INTRAMEDULLARY (IM) NAIL INTERTROCHANTERIC Right 06/12/2023   Procedure: INTRAMEDULLARY (IM) NAIL INTERTROCHANTERIC;  Surgeon: Roby Lofts, MD;   Location: [facility] OR;  Service: Orthopedics;  Laterality: Right;   PACEMAKER INSERTION     June 2024   RIGHT/LEFT HEART CATH AND CORONARY ANGIOGRAPHY N/A 07/28/2020   Procedure: RIGHT/LEFT HEART CATH AND CORONARY ANGIOGRAPHY;  Surgeon: Tonny Bollman, MD;  Location: [facility] INVASIVE CV LAB;  Service: Cardiovascular;  Laterality: N/A;   ROTATOR CUFF REPAIR Right 2012   TEE WITHOUT CARDIOVERSION N/A 02/02/2023   Procedure: TRANSESOPHAGEAL ECHOCARDIOGRAM (TEE);  Surgeon: Marjo Bicker, MD;  Location: [facility] ORS;  Service: Cardiovascular;  Laterality: N/A;   TONSILLECTOMY     TOTAL KNEE ARTHROPLASTY Right 10/13/2021   Procedure: TOTAL KNEE ARTHROPLASTY;  Surgeon: Durene Romans, MD;  Location: [facility] ORS;  Service: Orthopedics;  Laterality: Right;   TUBAL LIGATION     WISDOM TOOTH EXTRACTION        Current Outpatient Medications:    acetaminophen (TYLENOL) 325 MG tablet, Take 2 tablets (650 mg total) by mouth every 6 (six) hours as needed for mild pain (pain score 1-3) (or Fever >/= 101)., Disp: , Rfl:    amiodarone (PACERONE) 200 MG tablet, Take 1 tablet (200 mg total) by mouth daily. Amiodarone 200 mg BID x 3 weeks followed by amiodarone 200 mg once daily. (Patient taking differently: Take 200 mg by mouth daily.), Disp: 90 tablet, Rfl: 3   docusate sodium (COLACE) 100 MG capsule, Take 1 capsule (100 mg total) by mouth 2 (two) times daily., Disp: 60 capsule, Rfl: 0   ezetimibe (ZETIA) 10 MG tablet, Take 10 mg by mouth every morning., Disp: , Rfl:    ferrous sulfate 325 (65 FE) MG tablet, Take 1 tablet (325 mg total) by mouth daily with breakfast., Disp: 30 tablet, Rfl: 0   insulin aspart (NOVOLOG) 100 UNIT/ML FlexPen, Inject 0-6 Units into the skin 3 (three) times daily with meals. Check Blood Glucose (BG) and inject per scale: BG <150= 0 unit; BG 150-200= 1 unit; BG 201-250= 2 unit; BG 251-300= 3 unit; BG 301-350= 4 unit; BG 351-400= 5 unit; BG >400= 6 unit and Call Primary Care., Disp: , Rfl:    insulin  glargine (LANTUS) 100 UNIT/ML Solostar Pen, Inject 12 Units into the skin daily. May substitute as needed per insurance., Disp: , Rfl:    losartan (COZAAR) 25 MG tablet, Take 25 mg by mouth every morning., Disp: , Rfl:    magnesium oxide (MAG-OX) 400 (240 Mg) MG tablet, Take 400 mg by mouth 2 (two) times daily., Disp: , Rfl:    melatonin 1 MG TABS tablet, Take 4 mg by mouth at bedtime as needed (sleep). gummies, Disp: , Rfl:    methocarbamol (ROBAXIN) 500 MG tablet, Take 1 tablet (500 mg total) by mouth every 8 (eight) hours as needed for muscle spasms., Disp: 30 tablet, Rfl: 0   metoprolol succinate (TOPROL-XL) 25 MG 24 hr tablet, Take 1 tablet (25 mg total) by mouth daily., Disp: 30 tablet, Rfl: 2   omeprazole (PRILOSEC) 20 MG capsule, Take 20 mg by mouth every morning., Disp: , Rfl: 3   oxyCODONE (OXY IR/ROXICODONE) 5 MG immediate release tablet, Take 1 tablet (5 mg total) by mouth every 4 (four) hours as needed for severe pain (pain score 7-10)., Disp: 42 tablet, Rfl: 0   polyethylene glycol (MIRALAX / GLYCOLAX) 17 g packet, Take 17 g by mouth daily., Disp: 14 each, Rfl: 0   senna (SENOKOT) 8.6 MG TABS tablet, Take 1 tablet (8.6 mg total) by mouth daily as needed for mild constipation., Disp: 30 tablet, Rfl: 0   Vitamin D, Ergocalciferol, (DRISDOL) 1.25 MG (50000 UNIT) CAPS capsule, Take 1 capsule (50,000 Units total) by mouth every 7 (seven) days., Disp: 5 capsule, Rfl: 0   warfarin (COUMADIN) 3 MG tablet, Take 1/2 a tablet to 1 tablet by mouth daily as directed by the coumadin clinic., Disp: 30 tablet, Rfl: 1    Physical Exam: Last menstrual period 04/26/1998.    Affect appropriate Healthy:  appears stated age HEENT: normal Neck supple with  no adenopathy JVP normal no bruits no thyromegaly Lungs clear with no wheezing and good diaphragmatic motion Heart:  S1/S2 SEM through AVR PPM under left clavicle prior sternotomy Abdomen: benighn, BS positve, no tenderness, no AAA no bruit.  No  HSM or HJR Distal pulses intact with no bruits No edema Neuro non-focal Right femur fracture post surgical repair   Labs:   Lab Results  Component Value Date   WBC 11.6 (H) 06/16/2023   HGB 7.5 (L) 06/16/2023   HCT 22.9 (L) 06/16/2023   MCV 91.6 06/16/2023   PLT 273 06/16/2023      Radiology: DG FEMUR PORT, MIN 2 VIEWS RIGHT Result Date: 06/12/2023 CLINICAL DATA:  Postop. EXAM: RIGHT FEMUR PORTABLE 2 VIEW COMPARISON:  Preoperative imaging. FINDINGS: Long femoral intramedullary nail with trans trochanteric and distal locking screw fixation traverse comminuted proximal femur fracture. Mild persistent angulation and displacement of dominant fracture site. Recent postsurgical change includes air and edema in the soft tissues. IMPRESSION: ORIF proximal femur fracture without immediate postoperative complication. Electronically Signed   By: Narda Rutherford M.D.   On: 06/12/2023 13:21   DG FEMUR, MIN 2 VIEWS RIGHT Result Date: 06/12/2023 CLINICAL DATA:  Elective surgery EXAM: RIGHT FEMUR 2 VIEWS COMPARISON:  Preoperative imaging FINDINGS: Eight fluoroscopic spot views of the right femur submitted from the operating room. Femoral intramedullary nail with trans trochanteric and distal locking screw fixation traversing proximal femur fracture. Fluoroscopy time 1.37 minutes. Dose 20.76 mGy. IMPRESSION: Procedural fluoroscopy during proximal femur fracture ORIF. Electronically Signed   By: Narda Rutherford M.D.   On: 06/12/2023 13:21   DG C-Arm 1-60 Min-No Report Result Date: 06/12/2023 Fluoroscopy was utilized by the requesting physician.  No radiographic interpretation.   DG FEMUR, MIN 2 VIEWS RIGHT Result Date: 06/11/2023 CLINICAL DATA:  [address] Hip fracture (HCC) [phone] EXAM: RIGHT FEMUR 2 VIEWS COMPARISON:  Same day radiograph FINDINGS: Comminuted inter trochanteric fracture with lateral displacement of the greater trochanter medial displacement of the lesser trochanter. Osteopenia. Status post  knee arthroplasty. Orthopedic hardware is intact and without periprosthetic fracture or lucency. Vascular calcifications. IMPRESSION: Comminuted intertrochanteric fracture. Electronically Signed   By: Meda Klinefelter M.D.   On: 06/11/2023 15:19   DG Chest Port 1 View Result Date: 06/11/2023 CLINICAL DATA:  sob, fall EXAM: PORTABLE CHEST 1 VIEW COMPARISON:  January 31, 2023 FINDINGS: The cardiomediastinal silhouette is unchanged and enlarged in contour.Status post median sternotomy and multiple valve replacement. LEFT chest cardiac pacing device. Atherosclerotic calcifications. No pleural effusion. No pneumothorax. No acute pleuroparenchymal abnormality. IMPRESSION: No acute cardiopulmonary abnormality. Electronically Signed   By: Meda Klinefelter M.D.   On: 06/11/2023 14:00   DG Pelvis Portable Result Date: 06/11/2023 CLINICAL DATA:  Fall EXAM: PORTABLE PELVIS 1-2 VIEWS COMPARISON:  None Available. FINDINGS: Osteopenia. Comminuted inter trochanteric fracture of the RIGHT proximal femur with mild to moderate displacement of the greater and lesser trochanter. RIGHT femoral head appears grossly aligned within the acetabulum. The sacrum is obscured by overlapping bowel contents. Lucency along the LEFT proximal femoral head is favored to be summation artifact from overlapping osseous structures. Atherosclerotic calcifications. IMPRESSION: Comminuted intertrochanteric fracture of the RIGHT proximal femur. Electronically Signed   By: Meda Klinefelter M.D.   On: 06/11/2023 13:46    EKG: 2019 SR rate 90 normal QT poor R wave progression 05/21/20 SR rate 68 LAD poor R wave progression 06/21/2023 SR rate 76 LVH    ASSESSMENT AND PLAN:   HOCM:  post septal myectomy no residual  gradient  AVR/MVR not clear why bioprosthetic AVR and mechanical MVR normal function on TEE 02/10/23 continue coumadin PAF:  continue amiodarone maintaining NSR check TSH/LFTls and PFTls with DLCO Hemachromatosis:  f/u with hematology  Hct 23.5 post surgery Ortho:  right femur fracture with operative nailing by Truitt Merle  ***  F/U me 6 months   Signed: Charlton Haws 06/21/2023, 1:12 PM

## 2023-06-22 DIAGNOSIS — R5381 Other malaise: Secondary | ICD-10-CM | POA: Diagnosis not present

## 2023-06-22 DIAGNOSIS — E785 Hyperlipidemia, unspecified: Secondary | ICD-10-CM | POA: Diagnosis not present

## 2023-06-22 DIAGNOSIS — S7291XD Unspecified fracture of right femur, subsequent encounter for closed fracture with routine healing: Secondary | ICD-10-CM | POA: Diagnosis not present

## 2023-06-22 DIAGNOSIS — I4821 Permanent atrial fibrillation: Secondary | ICD-10-CM | POA: Diagnosis not present

## 2023-06-22 DIAGNOSIS — I10 Essential (primary) hypertension: Secondary | ICD-10-CM | POA: Diagnosis not present

## 2023-06-22 DIAGNOSIS — E559 Vitamin D deficiency, unspecified: Secondary | ICD-10-CM | POA: Diagnosis not present

## 2023-06-22 DIAGNOSIS — N1832 Chronic kidney disease, stage 3b: Secondary | ICD-10-CM | POA: Diagnosis not present

## 2023-06-22 DIAGNOSIS — E1169 Type 2 diabetes mellitus with other specified complication: Secondary | ICD-10-CM | POA: Diagnosis not present

## 2023-06-23 ENCOUNTER — Encounter (HOSPITAL_COMMUNITY): Payer: Medicare Other

## 2023-06-23 DIAGNOSIS — R791 Abnormal coagulation profile: Secondary | ICD-10-CM | POA: Diagnosis not present

## 2023-06-23 DIAGNOSIS — R5381 Other malaise: Secondary | ICD-10-CM | POA: Diagnosis not present

## 2023-06-23 DIAGNOSIS — I4821 Permanent atrial fibrillation: Secondary | ICD-10-CM | POA: Diagnosis not present

## 2023-06-28 ENCOUNTER — Encounter (HOSPITAL_COMMUNITY): Payer: Medicare Other

## 2023-06-29 DIAGNOSIS — Z7901 Long term (current) use of anticoagulants: Secondary | ICD-10-CM | POA: Diagnosis not present

## 2023-06-29 DIAGNOSIS — Z952 Presence of prosthetic heart valve: Secondary | ICD-10-CM | POA: Diagnosis not present

## 2023-06-29 DIAGNOSIS — I4821 Permanent atrial fibrillation: Secondary | ICD-10-CM | POA: Diagnosis not present

## 2023-06-30 ENCOUNTER — Ambulatory Visit: Payer: Medicare Other | Admitting: Cardiovascular Disease

## 2023-06-30 ENCOUNTER — Encounter (HOSPITAL_COMMUNITY): Payer: Medicare Other

## 2023-07-01 DIAGNOSIS — Z952 Presence of prosthetic heart valve: Secondary | ICD-10-CM | POA: Diagnosis not present

## 2023-07-01 DIAGNOSIS — I4821 Permanent atrial fibrillation: Secondary | ICD-10-CM | POA: Diagnosis not present

## 2023-07-01 DIAGNOSIS — Z7901 Long term (current) use of anticoagulants: Secondary | ICD-10-CM | POA: Diagnosis not present

## 2023-07-05 ENCOUNTER — Encounter (HOSPITAL_COMMUNITY): Payer: Medicare Other

## 2023-07-05 DIAGNOSIS — R791 Abnormal coagulation profile: Secondary | ICD-10-CM | POA: Diagnosis not present

## 2023-07-05 DIAGNOSIS — Z7901 Long term (current) use of anticoagulants: Secondary | ICD-10-CM | POA: Diagnosis not present

## 2023-07-05 DIAGNOSIS — N1832 Chronic kidney disease, stage 3b: Secondary | ICD-10-CM | POA: Diagnosis not present

## 2023-07-05 DIAGNOSIS — D649 Anemia, unspecified: Secondary | ICD-10-CM | POA: Diagnosis not present

## 2023-07-05 DIAGNOSIS — S72141D Displaced intertrochanteric fracture of right femur, subsequent encounter for closed fracture with routine healing: Secondary | ICD-10-CM | POA: Diagnosis not present

## 2023-07-05 DIAGNOSIS — Z952 Presence of prosthetic heart valve: Secondary | ICD-10-CM | POA: Diagnosis not present

## 2023-07-05 DIAGNOSIS — I4821 Permanent atrial fibrillation: Secondary | ICD-10-CM | POA: Diagnosis not present

## 2023-07-06 DIAGNOSIS — E785 Hyperlipidemia, unspecified: Secondary | ICD-10-CM | POA: Diagnosis not present

## 2023-07-06 DIAGNOSIS — S7291XD Unspecified fracture of right femur, subsequent encounter for closed fracture with routine healing: Secondary | ICD-10-CM | POA: Diagnosis not present

## 2023-07-06 DIAGNOSIS — R5381 Other malaise: Secondary | ICD-10-CM | POA: Diagnosis not present

## 2023-07-06 DIAGNOSIS — E1169 Type 2 diabetes mellitus with other specified complication: Secondary | ICD-10-CM | POA: Diagnosis not present

## 2023-07-06 DIAGNOSIS — R791 Abnormal coagulation profile: Secondary | ICD-10-CM | POA: Diagnosis not present

## 2023-07-06 DIAGNOSIS — I4821 Permanent atrial fibrillation: Secondary | ICD-10-CM | POA: Diagnosis not present

## 2023-07-06 DIAGNOSIS — I10 Essential (primary) hypertension: Secondary | ICD-10-CM | POA: Diagnosis not present

## 2023-07-06 DIAGNOSIS — N1832 Chronic kidney disease, stage 3b: Secondary | ICD-10-CM | POA: Diagnosis not present

## 2023-07-06 DIAGNOSIS — Z7901 Long term (current) use of anticoagulants: Secondary | ICD-10-CM | POA: Diagnosis not present

## 2023-07-06 DIAGNOSIS — D649 Anemia, unspecified: Secondary | ICD-10-CM | POA: Diagnosis not present

## 2023-07-06 DIAGNOSIS — Z952 Presence of prosthetic heart valve: Secondary | ICD-10-CM | POA: Diagnosis not present

## 2023-07-07 ENCOUNTER — Encounter (HOSPITAL_COMMUNITY): Payer: Medicare Other

## 2023-07-11 DIAGNOSIS — M1990 Unspecified osteoarthritis, unspecified site: Secondary | ICD-10-CM | POA: Diagnosis not present

## 2023-07-11 DIAGNOSIS — E1122 Type 2 diabetes mellitus with diabetic chronic kidney disease: Secondary | ICD-10-CM | POA: Diagnosis not present

## 2023-07-11 DIAGNOSIS — G43909 Migraine, unspecified, not intractable, without status migrainosus: Secondary | ICD-10-CM | POA: Diagnosis not present

## 2023-07-11 DIAGNOSIS — E559 Vitamin D deficiency, unspecified: Secondary | ICD-10-CM | POA: Diagnosis not present

## 2023-07-11 DIAGNOSIS — S7291XD Unspecified fracture of right femur, subsequent encounter for closed fracture with routine healing: Secondary | ICD-10-CM | POA: Diagnosis not present

## 2023-07-11 DIAGNOSIS — E1165 Type 2 diabetes mellitus with hyperglycemia: Secondary | ICD-10-CM | POA: Diagnosis not present

## 2023-07-11 DIAGNOSIS — I129 Hypertensive chronic kidney disease with stage 1 through stage 4 chronic kidney disease, or unspecified chronic kidney disease: Secondary | ICD-10-CM | POA: Diagnosis not present

## 2023-07-11 DIAGNOSIS — N1832 Chronic kidney disease, stage 3b: Secondary | ICD-10-CM | POA: Diagnosis not present

## 2023-07-11 DIAGNOSIS — I422 Other hypertrophic cardiomyopathy: Secondary | ICD-10-CM | POA: Diagnosis not present

## 2023-07-11 DIAGNOSIS — I4821 Permanent atrial fibrillation: Secondary | ICD-10-CM | POA: Diagnosis not present

## 2023-07-12 ENCOUNTER — Encounter (HOSPITAL_COMMUNITY): Payer: Medicare Other

## 2023-07-13 ENCOUNTER — Telehealth: Payer: Self-pay | Admitting: Cardiovascular Disease

## 2023-07-13 NOTE — Telephone Encounter (Signed)
 She wants to discuss pt's INR

## 2023-07-13 NOTE — Telephone Encounter (Signed)
 Spoke with [facility] Nurse.  Pt fell at home 06/11/23 and broke hip.  Pt had surgery then went to rehab.  Now back at home with [facility].  Orders given to resume previous home dose of warfarin 3mg  daily except 1.5mg  on Tuesdays and Saturdays.  Recheck INR Monday 07/18/23.  Order given to [employer] [facility].  [phone]. She verbalized understanding.

## 2023-07-14 ENCOUNTER — Encounter (HOSPITAL_COMMUNITY): Payer: Medicare Other

## 2023-07-18 DIAGNOSIS — M96669 Fracture of femur following insertion of orthopedic implant, joint prosthesis, or bone plate, unspecified leg: Secondary | ICD-10-CM | POA: Diagnosis not present

## 2023-07-19 ENCOUNTER — Encounter (HOSPITAL_COMMUNITY): Payer: Medicare Other

## 2023-07-19 ENCOUNTER — Ambulatory Visit (INDEPENDENT_AMBULATORY_CARE_PROVIDER_SITE_OTHER): Payer: Self-pay | Admitting: *Deleted

## 2023-07-19 DIAGNOSIS — Z5181 Encounter for therapeutic drug level monitoring: Secondary | ICD-10-CM

## 2023-07-19 DIAGNOSIS — S7291XD Unspecified fracture of right femur, subsequent encounter for closed fracture with routine healing: Secondary | ICD-10-CM | POA: Diagnosis not present

## 2023-07-19 DIAGNOSIS — I4821 Permanent atrial fibrillation: Secondary | ICD-10-CM | POA: Diagnosis not present

## 2023-07-19 DIAGNOSIS — I4891 Unspecified atrial fibrillation: Secondary | ICD-10-CM | POA: Diagnosis not present

## 2023-07-19 DIAGNOSIS — I422 Other hypertrophic cardiomyopathy: Secondary | ICD-10-CM | POA: Diagnosis not present

## 2023-07-19 DIAGNOSIS — E559 Vitamin D deficiency, unspecified: Secondary | ICD-10-CM | POA: Diagnosis not present

## 2023-07-19 DIAGNOSIS — E1122 Type 2 diabetes mellitus with diabetic chronic kidney disease: Secondary | ICD-10-CM | POA: Diagnosis not present

## 2023-07-19 DIAGNOSIS — N1832 Chronic kidney disease, stage 3b: Secondary | ICD-10-CM | POA: Diagnosis not present

## 2023-07-19 DIAGNOSIS — M1990 Unspecified osteoarthritis, unspecified site: Secondary | ICD-10-CM | POA: Diagnosis not present

## 2023-07-19 DIAGNOSIS — G43909 Migraine, unspecified, not intractable, without status migrainosus: Secondary | ICD-10-CM | POA: Diagnosis not present

## 2023-07-19 DIAGNOSIS — E1165 Type 2 diabetes mellitus with hyperglycemia: Secondary | ICD-10-CM | POA: Diagnosis not present

## 2023-07-19 DIAGNOSIS — I129 Hypertensive chronic kidney disease with stage 1 through stage 4 chronic kidney disease, or unspecified chronic kidney disease: Secondary | ICD-10-CM | POA: Diagnosis not present

## 2023-07-19 LAB — POCT INR: INR: 7.2 — AB (ref 2.0–3.0)

## 2023-07-19 NOTE — Patient Instructions (Signed)
 S/P surgery Rt hip after fall.  Been in rehab.  Home now with [facility] Hold warfarin x 4 days then take 1/2 tablet on Saturday and Sunday and recheck INR on Mondays On amiodarone 200mg  daily Recheck in 1 wk Orders given to [employer] [facility]

## 2023-07-21 ENCOUNTER — Telehealth: Payer: Self-pay | Admitting: Cardiovascular Disease

## 2023-07-21 ENCOUNTER — Encounter (HOSPITAL_COMMUNITY): Payer: Medicare Other

## 2023-07-21 DIAGNOSIS — I422 Other hypertrophic cardiomyopathy: Secondary | ICD-10-CM | POA: Diagnosis not present

## 2023-07-21 DIAGNOSIS — E559 Vitamin D deficiency, unspecified: Secondary | ICD-10-CM | POA: Diagnosis not present

## 2023-07-21 DIAGNOSIS — S7291XD Unspecified fracture of right femur, subsequent encounter for closed fracture with routine healing: Secondary | ICD-10-CM | POA: Diagnosis not present

## 2023-07-21 DIAGNOSIS — E1165 Type 2 diabetes mellitus with hyperglycemia: Secondary | ICD-10-CM | POA: Diagnosis not present

## 2023-07-21 DIAGNOSIS — G43909 Migraine, unspecified, not intractable, without status migrainosus: Secondary | ICD-10-CM | POA: Diagnosis not present

## 2023-07-21 DIAGNOSIS — M1990 Unspecified osteoarthritis, unspecified site: Secondary | ICD-10-CM | POA: Diagnosis not present

## 2023-07-21 DIAGNOSIS — E1122 Type 2 diabetes mellitus with diabetic chronic kidney disease: Secondary | ICD-10-CM | POA: Diagnosis not present

## 2023-07-21 DIAGNOSIS — N1832 Chronic kidney disease, stage 3b: Secondary | ICD-10-CM | POA: Diagnosis not present

## 2023-07-21 DIAGNOSIS — I4821 Permanent atrial fibrillation: Secondary | ICD-10-CM | POA: Diagnosis not present

## 2023-07-21 DIAGNOSIS — I129 Hypertensive chronic kidney disease with stage 1 through stage 4 chronic kidney disease, or unspecified chronic kidney disease: Secondary | ICD-10-CM | POA: Diagnosis not present

## 2023-07-21 NOTE — Telephone Encounter (Signed)
 Called and spoke to pt who asked how she should take her warfarin. Instructed pt to continue holding warfarin and on 3/29  take warfarin 1.5 mg and on 3/30  take 4.5mg  of warfarin ( followed the instructions given to pt on 3/25 anti coag visit). Pt is to have INR checked on 3/31.   Pt also said her husband just picked up the warfarin 2.5 mg tablets sent in by the rehab facility. Instructed pt to continue using the warfarin 3mg  tablets ( pt stated she had about 1 week left of the warfarin 3mg  tablets).    Called Leah the pt assistant and gave her the same instructions. However, Tacey Ruiz was wondering if pt could continue PT. Instructed Leah to contact the physician who ordered the PT.

## 2023-07-21 NOTE — Telephone Encounter (Signed)
 Pt called in asking to clarify how she should be taking her warfarin.   Leah with [facility] was also with the pt during this call and she would like to know with her INR levels right now are there any limitations or precautions they should take for her PT. Please advise.    Leah #: [phone]

## 2023-07-22 DIAGNOSIS — I4821 Permanent atrial fibrillation: Secondary | ICD-10-CM | POA: Diagnosis not present

## 2023-07-22 DIAGNOSIS — S7291XD Unspecified fracture of right femur, subsequent encounter for closed fracture with routine healing: Secondary | ICD-10-CM | POA: Diagnosis not present

## 2023-07-22 DIAGNOSIS — I129 Hypertensive chronic kidney disease with stage 1 through stage 4 chronic kidney disease, or unspecified chronic kidney disease: Secondary | ICD-10-CM | POA: Diagnosis not present

## 2023-07-22 DIAGNOSIS — E1122 Type 2 diabetes mellitus with diabetic chronic kidney disease: Secondary | ICD-10-CM | POA: Diagnosis not present

## 2023-07-25 ENCOUNTER — Ambulatory Visit (INDEPENDENT_AMBULATORY_CARE_PROVIDER_SITE_OTHER): Payer: Self-pay | Admitting: *Deleted

## 2023-07-25 DIAGNOSIS — I4821 Permanent atrial fibrillation: Secondary | ICD-10-CM | POA: Diagnosis not present

## 2023-07-25 DIAGNOSIS — N1832 Chronic kidney disease, stage 3b: Secondary | ICD-10-CM | POA: Diagnosis not present

## 2023-07-25 DIAGNOSIS — S7291XD Unspecified fracture of right femur, subsequent encounter for closed fracture with routine healing: Secondary | ICD-10-CM | POA: Diagnosis not present

## 2023-07-25 DIAGNOSIS — I129 Hypertensive chronic kidney disease with stage 1 through stage 4 chronic kidney disease, or unspecified chronic kidney disease: Secondary | ICD-10-CM | POA: Diagnosis not present

## 2023-07-25 DIAGNOSIS — I4891 Unspecified atrial fibrillation: Secondary | ICD-10-CM

## 2023-07-25 DIAGNOSIS — E559 Vitamin D deficiency, unspecified: Secondary | ICD-10-CM | POA: Diagnosis not present

## 2023-07-25 DIAGNOSIS — Z952 Presence of prosthetic heart valve: Secondary | ICD-10-CM | POA: Diagnosis not present

## 2023-07-25 DIAGNOSIS — M1990 Unspecified osteoarthritis, unspecified site: Secondary | ICD-10-CM | POA: Diagnosis not present

## 2023-07-25 DIAGNOSIS — I422 Other hypertrophic cardiomyopathy: Secondary | ICD-10-CM | POA: Diagnosis not present

## 2023-07-25 DIAGNOSIS — Z5181 Encounter for therapeutic drug level monitoring: Secondary | ICD-10-CM | POA: Diagnosis not present

## 2023-07-25 DIAGNOSIS — E1165 Type 2 diabetes mellitus with hyperglycemia: Secondary | ICD-10-CM | POA: Diagnosis not present

## 2023-07-25 DIAGNOSIS — E1122 Type 2 diabetes mellitus with diabetic chronic kidney disease: Secondary | ICD-10-CM | POA: Diagnosis not present

## 2023-07-25 DIAGNOSIS — G43909 Migraine, unspecified, not intractable, without status migrainosus: Secondary | ICD-10-CM | POA: Diagnosis not present

## 2023-07-25 LAB — POCT INR: INR: 3.2 — AB (ref 2.0–3.0)

## 2023-07-25 NOTE — Patient Instructions (Signed)
 S/P surgery Rt hip after fall.  Been in rehab.  Home now with [facility] Pt has a full bottle of 2.5mg  tablets.  We will change to those Take 1 tablet (2.5mg ) daily On amiodarone 200mg  daily Recheck in 1 wk Orders given to [facility]

## 2023-07-26 ENCOUNTER — Encounter (HOSPITAL_COMMUNITY): Payer: Medicare Other

## 2023-07-26 DIAGNOSIS — I422 Other hypertrophic cardiomyopathy: Secondary | ICD-10-CM | POA: Diagnosis not present

## 2023-07-26 DIAGNOSIS — S7291XD Unspecified fracture of right femur, subsequent encounter for closed fracture with routine healing: Secondary | ICD-10-CM | POA: Diagnosis not present

## 2023-07-26 DIAGNOSIS — E559 Vitamin D deficiency, unspecified: Secondary | ICD-10-CM | POA: Diagnosis not present

## 2023-07-26 DIAGNOSIS — N1832 Chronic kidney disease, stage 3b: Secondary | ICD-10-CM | POA: Diagnosis not present

## 2023-07-26 DIAGNOSIS — I129 Hypertensive chronic kidney disease with stage 1 through stage 4 chronic kidney disease, or unspecified chronic kidney disease: Secondary | ICD-10-CM | POA: Diagnosis not present

## 2023-07-26 DIAGNOSIS — E1165 Type 2 diabetes mellitus with hyperglycemia: Secondary | ICD-10-CM | POA: Diagnosis not present

## 2023-07-26 DIAGNOSIS — G43909 Migraine, unspecified, not intractable, without status migrainosus: Secondary | ICD-10-CM | POA: Diagnosis not present

## 2023-07-26 DIAGNOSIS — M1990 Unspecified osteoarthritis, unspecified site: Secondary | ICD-10-CM | POA: Diagnosis not present

## 2023-07-26 DIAGNOSIS — I4821 Permanent atrial fibrillation: Secondary | ICD-10-CM | POA: Diagnosis not present

## 2023-07-26 DIAGNOSIS — E1122 Type 2 diabetes mellitus with diabetic chronic kidney disease: Secondary | ICD-10-CM | POA: Diagnosis not present

## 2023-07-28 ENCOUNTER — Encounter (HOSPITAL_COMMUNITY): Payer: Medicare Other

## 2023-07-29 ENCOUNTER — Telehealth: Payer: Self-pay | Admitting: Cardiovascular Disease

## 2023-07-29 DIAGNOSIS — M1990 Unspecified osteoarthritis, unspecified site: Secondary | ICD-10-CM | POA: Diagnosis not present

## 2023-07-29 DIAGNOSIS — E1122 Type 2 diabetes mellitus with diabetic chronic kidney disease: Secondary | ICD-10-CM | POA: Diagnosis not present

## 2023-07-29 DIAGNOSIS — G43909 Migraine, unspecified, not intractable, without status migrainosus: Secondary | ICD-10-CM | POA: Diagnosis not present

## 2023-07-29 DIAGNOSIS — I422 Other hypertrophic cardiomyopathy: Secondary | ICD-10-CM | POA: Diagnosis not present

## 2023-07-29 DIAGNOSIS — E559 Vitamin D deficiency, unspecified: Secondary | ICD-10-CM | POA: Diagnosis not present

## 2023-07-29 DIAGNOSIS — I129 Hypertensive chronic kidney disease with stage 1 through stage 4 chronic kidney disease, or unspecified chronic kidney disease: Secondary | ICD-10-CM | POA: Diagnosis not present

## 2023-07-29 DIAGNOSIS — I4821 Permanent atrial fibrillation: Secondary | ICD-10-CM | POA: Diagnosis not present

## 2023-07-29 DIAGNOSIS — E1165 Type 2 diabetes mellitus with hyperglycemia: Secondary | ICD-10-CM | POA: Diagnosis not present

## 2023-07-29 DIAGNOSIS — S7291XD Unspecified fracture of right femur, subsequent encounter for closed fracture with routine healing: Secondary | ICD-10-CM | POA: Diagnosis not present

## 2023-07-29 DIAGNOSIS — N1832 Chronic kidney disease, stage 3b: Secondary | ICD-10-CM | POA: Diagnosis not present

## 2023-07-29 NOTE — Telephone Encounter (Signed)
 Spoke to [place] with home health, who advised that nurse who does pt's INR is unavailable to do INR check on Monday and is requesting that check be performed on Tuesday.  Please advise.

## 2023-07-29 NOTE — Telephone Encounter (Signed)
 Home health nurse would like a call in regards to INR. Please advise

## 2023-07-29 NOTE — Telephone Encounter (Signed)
 Returned call and spoke with [place]. Made her aware that Tuesday is fine for INR to be checked instead of Monday.

## 2023-08-01 DIAGNOSIS — E1122 Type 2 diabetes mellitus with diabetic chronic kidney disease: Secondary | ICD-10-CM | POA: Diagnosis not present

## 2023-08-01 DIAGNOSIS — S7291XD Unspecified fracture of right femur, subsequent encounter for closed fracture with routine healing: Secondary | ICD-10-CM | POA: Diagnosis not present

## 2023-08-01 DIAGNOSIS — G43909 Migraine, unspecified, not intractable, without status migrainosus: Secondary | ICD-10-CM | POA: Diagnosis not present

## 2023-08-01 DIAGNOSIS — E1165 Type 2 diabetes mellitus with hyperglycemia: Secondary | ICD-10-CM | POA: Diagnosis not present

## 2023-08-01 DIAGNOSIS — I129 Hypertensive chronic kidney disease with stage 1 through stage 4 chronic kidney disease, or unspecified chronic kidney disease: Secondary | ICD-10-CM | POA: Diagnosis not present

## 2023-08-01 DIAGNOSIS — E559 Vitamin D deficiency, unspecified: Secondary | ICD-10-CM | POA: Diagnosis not present

## 2023-08-01 DIAGNOSIS — N1832 Chronic kidney disease, stage 3b: Secondary | ICD-10-CM | POA: Diagnosis not present

## 2023-08-01 DIAGNOSIS — M1990 Unspecified osteoarthritis, unspecified site: Secondary | ICD-10-CM | POA: Diagnosis not present

## 2023-08-01 DIAGNOSIS — I422 Other hypertrophic cardiomyopathy: Secondary | ICD-10-CM | POA: Diagnosis not present

## 2023-08-01 DIAGNOSIS — I4821 Permanent atrial fibrillation: Secondary | ICD-10-CM | POA: Diagnosis not present

## 2023-08-02 ENCOUNTER — Telehealth: Payer: Self-pay | Admitting: Internal Medicine

## 2023-08-02 ENCOUNTER — Ambulatory Visit (INDEPENDENT_AMBULATORY_CARE_PROVIDER_SITE_OTHER): Payer: Self-pay | Admitting: *Deleted

## 2023-08-02 ENCOUNTER — Encounter (HOSPITAL_COMMUNITY): Payer: Medicare Other

## 2023-08-02 ENCOUNTER — Other Ambulatory Visit: Payer: Self-pay | Admitting: Internal Medicine

## 2023-08-02 DIAGNOSIS — E559 Vitamin D deficiency, unspecified: Secondary | ICD-10-CM | POA: Diagnosis not present

## 2023-08-02 DIAGNOSIS — I422 Other hypertrophic cardiomyopathy: Secondary | ICD-10-CM | POA: Diagnosis not present

## 2023-08-02 DIAGNOSIS — I4891 Unspecified atrial fibrillation: Secondary | ICD-10-CM

## 2023-08-02 DIAGNOSIS — I4892 Unspecified atrial flutter: Secondary | ICD-10-CM

## 2023-08-02 DIAGNOSIS — I129 Hypertensive chronic kidney disease with stage 1 through stage 4 chronic kidney disease, or unspecified chronic kidney disease: Secondary | ICD-10-CM | POA: Diagnosis not present

## 2023-08-02 DIAGNOSIS — I4821 Permanent atrial fibrillation: Secondary | ICD-10-CM | POA: Diagnosis not present

## 2023-08-02 DIAGNOSIS — G43909 Migraine, unspecified, not intractable, without status migrainosus: Secondary | ICD-10-CM | POA: Diagnosis not present

## 2023-08-02 DIAGNOSIS — Z5181 Encounter for therapeutic drug level monitoring: Secondary | ICD-10-CM

## 2023-08-02 DIAGNOSIS — E1122 Type 2 diabetes mellitus with diabetic chronic kidney disease: Secondary | ICD-10-CM | POA: Diagnosis not present

## 2023-08-02 DIAGNOSIS — S72141D Displaced intertrochanteric fracture of right femur, subsequent encounter for closed fracture with routine healing: Secondary | ICD-10-CM | POA: Diagnosis not present

## 2023-08-02 DIAGNOSIS — E1165 Type 2 diabetes mellitus with hyperglycemia: Secondary | ICD-10-CM | POA: Diagnosis not present

## 2023-08-02 DIAGNOSIS — M1990 Unspecified osteoarthritis, unspecified site: Secondary | ICD-10-CM | POA: Diagnosis not present

## 2023-08-02 DIAGNOSIS — S7291XD Unspecified fracture of right femur, subsequent encounter for closed fracture with routine healing: Secondary | ICD-10-CM | POA: Diagnosis not present

## 2023-08-02 DIAGNOSIS — N1832 Chronic kidney disease, stage 3b: Secondary | ICD-10-CM | POA: Diagnosis not present

## 2023-08-02 LAB — POCT INR: INR: 4 — AB (ref 2.0–3.0)

## 2023-08-02 NOTE — Telephone Encounter (Signed)
 INR result noted and adjusted.  LM for [name] with [facility] to call me back.  Awaiting return call.

## 2023-08-02 NOTE — Patient Instructions (Signed)
 S/P surgery Rt hip after fall.  Been in rehab.  Home now with [facility] Pt has a full bottle of 2.5mg  tablets.  We will change to those Hold warfarin tonight then decrease dose to 1 tablet daily except 1/2 tablet on Mondays and Thursdays On amiodarone 200mg  daily Recheck in 1 wk Orders given to [facility]

## 2023-08-02 NOTE — Telephone Encounter (Signed)
 Nurse from [facility] calling to report that the pts INR was a 4.0. please advise

## 2023-08-02 NOTE — Telephone Encounter (Signed)
 Called pt and LM with coumadin instructions.  Will call [facility] nurse again tomorrow,

## 2023-08-03 DIAGNOSIS — M1990 Unspecified osteoarthritis, unspecified site: Secondary | ICD-10-CM | POA: Diagnosis not present

## 2023-08-03 DIAGNOSIS — I4821 Permanent atrial fibrillation: Secondary | ICD-10-CM | POA: Diagnosis not present

## 2023-08-03 DIAGNOSIS — E559 Vitamin D deficiency, unspecified: Secondary | ICD-10-CM | POA: Diagnosis not present

## 2023-08-03 DIAGNOSIS — E1122 Type 2 diabetes mellitus with diabetic chronic kidney disease: Secondary | ICD-10-CM | POA: Diagnosis not present

## 2023-08-03 DIAGNOSIS — S7291XD Unspecified fracture of right femur, subsequent encounter for closed fracture with routine healing: Secondary | ICD-10-CM | POA: Diagnosis not present

## 2023-08-03 DIAGNOSIS — I129 Hypertensive chronic kidney disease with stage 1 through stage 4 chronic kidney disease, or unspecified chronic kidney disease: Secondary | ICD-10-CM | POA: Diagnosis not present

## 2023-08-03 DIAGNOSIS — G43909 Migraine, unspecified, not intractable, without status migrainosus: Secondary | ICD-10-CM | POA: Diagnosis not present

## 2023-08-03 DIAGNOSIS — I422 Other hypertrophic cardiomyopathy: Secondary | ICD-10-CM | POA: Diagnosis not present

## 2023-08-03 DIAGNOSIS — N1832 Chronic kidney disease, stage 3b: Secondary | ICD-10-CM | POA: Diagnosis not present

## 2023-08-03 DIAGNOSIS — E1165 Type 2 diabetes mellitus with hyperglycemia: Secondary | ICD-10-CM | POA: Diagnosis not present

## 2023-08-03 NOTE — Telephone Encounter (Signed)
 Spoke with [facility].  Coumadin orders given from yesterday.  See coumadin note.

## 2023-08-04 ENCOUNTER — Encounter (HOSPITAL_COMMUNITY): Payer: Medicare Other

## 2023-08-05 ENCOUNTER — Telehealth: Payer: Self-pay | Admitting: Cardiovascular Disease

## 2023-08-05 DIAGNOSIS — G43909 Migraine, unspecified, not intractable, without status migrainosus: Secondary | ICD-10-CM | POA: Diagnosis not present

## 2023-08-05 DIAGNOSIS — E1165 Type 2 diabetes mellitus with hyperglycemia: Secondary | ICD-10-CM | POA: Diagnosis not present

## 2023-08-05 DIAGNOSIS — I4821 Permanent atrial fibrillation: Secondary | ICD-10-CM | POA: Diagnosis not present

## 2023-08-05 DIAGNOSIS — I129 Hypertensive chronic kidney disease with stage 1 through stage 4 chronic kidney disease, or unspecified chronic kidney disease: Secondary | ICD-10-CM | POA: Diagnosis not present

## 2023-08-05 DIAGNOSIS — E559 Vitamin D deficiency, unspecified: Secondary | ICD-10-CM | POA: Diagnosis not present

## 2023-08-05 DIAGNOSIS — S7291XD Unspecified fracture of right femur, subsequent encounter for closed fracture with routine healing: Secondary | ICD-10-CM | POA: Diagnosis not present

## 2023-08-05 DIAGNOSIS — E1122 Type 2 diabetes mellitus with diabetic chronic kidney disease: Secondary | ICD-10-CM | POA: Diagnosis not present

## 2023-08-05 DIAGNOSIS — M1990 Unspecified osteoarthritis, unspecified site: Secondary | ICD-10-CM | POA: Diagnosis not present

## 2023-08-05 DIAGNOSIS — I422 Other hypertrophic cardiomyopathy: Secondary | ICD-10-CM | POA: Diagnosis not present

## 2023-08-05 DIAGNOSIS — N1832 Chronic kidney disease, stage 3b: Secondary | ICD-10-CM | POA: Diagnosis not present

## 2023-08-05 NOTE — Telephone Encounter (Signed)
 Spoke with Mare Loan, [facility] nurse. Has had a bloodshot eye since Wednesday. Patient has held warfarin for 3 days because of her eye. Advised patient should restart warfarin tonight and reach out to [place] on Monday.

## 2023-08-05 NOTE — Telephone Encounter (Signed)
 The home health nurse called and stated that she was with pt and her right eye is blood shot.  She is concerned because she is also having headaches and is requesting to speak with number before she leaves her home   Best number [phone] [facility]

## 2023-08-08 ENCOUNTER — Telehealth: Payer: Self-pay | Admitting: Cardiovascular Disease

## 2023-08-08 DIAGNOSIS — N1832 Chronic kidney disease, stage 3b: Secondary | ICD-10-CM | POA: Diagnosis not present

## 2023-08-08 DIAGNOSIS — I422 Other hypertrophic cardiomyopathy: Secondary | ICD-10-CM | POA: Diagnosis not present

## 2023-08-08 DIAGNOSIS — G43909 Migraine, unspecified, not intractable, without status migrainosus: Secondary | ICD-10-CM | POA: Diagnosis not present

## 2023-08-08 DIAGNOSIS — I4821 Permanent atrial fibrillation: Secondary | ICD-10-CM | POA: Diagnosis not present

## 2023-08-08 DIAGNOSIS — M1990 Unspecified osteoarthritis, unspecified site: Secondary | ICD-10-CM | POA: Diagnosis not present

## 2023-08-08 DIAGNOSIS — E1122 Type 2 diabetes mellitus with diabetic chronic kidney disease: Secondary | ICD-10-CM | POA: Diagnosis not present

## 2023-08-08 DIAGNOSIS — E559 Vitamin D deficiency, unspecified: Secondary | ICD-10-CM | POA: Diagnosis not present

## 2023-08-08 DIAGNOSIS — S7291XD Unspecified fracture of right femur, subsequent encounter for closed fracture with routine healing: Secondary | ICD-10-CM | POA: Diagnosis not present

## 2023-08-08 DIAGNOSIS — I129 Hypertensive chronic kidney disease with stage 1 through stage 4 chronic kidney disease, or unspecified chronic kidney disease: Secondary | ICD-10-CM | POA: Diagnosis not present

## 2023-08-08 DIAGNOSIS — E1165 Type 2 diabetes mellitus with hyperglycemia: Secondary | ICD-10-CM | POA: Diagnosis not present

## 2023-08-08 LAB — POCT INR: INR: 2.6 (ref 2.0–3.0)

## 2023-08-08 NOTE — Telephone Encounter (Signed)
 [name] with [facility] called to report pt INR today is 2.6.

## 2023-08-09 ENCOUNTER — Encounter (HOSPITAL_COMMUNITY): Payer: Medicare Other

## 2023-08-09 ENCOUNTER — Ambulatory Visit (INDEPENDENT_AMBULATORY_CARE_PROVIDER_SITE_OTHER): Payer: Self-pay | Admitting: *Deleted

## 2023-08-09 DIAGNOSIS — Z5181 Encounter for therapeutic drug level monitoring: Secondary | ICD-10-CM | POA: Diagnosis not present

## 2023-08-09 DIAGNOSIS — I4891 Unspecified atrial fibrillation: Secondary | ICD-10-CM | POA: Diagnosis not present

## 2023-08-09 NOTE — Telephone Encounter (Signed)
 Spoke with [facility] Nurse [facility].  INR on 06/10/23 was 2.6.  Eye is better and is clearing up.  Will continue current dose of warfarin.  See coumadin note from today.

## 2023-08-09 NOTE — Patient Instructions (Signed)
 S/P surgery Rt hip after fall.  Been in rehab.  Home now with [facility] Pt has a full bottle of 2.5mg  tablets.  We will change to those Continue warfarin 1 tablet daily except 1/2 tablet on Mondays and Thursdays On amiodarone 200mg  daily Recheck in 1 wk Orders given to [facility]

## 2023-08-09 NOTE — Telephone Encounter (Signed)
 Called and spoke with [name].  Coumadin orders given.  See coumadin note.

## 2023-08-11 ENCOUNTER — Encounter (HOSPITAL_COMMUNITY): Payer: Medicare Other

## 2023-08-12 ENCOUNTER — Ambulatory Visit (INDEPENDENT_AMBULATORY_CARE_PROVIDER_SITE_OTHER): Payer: Medicare Other

## 2023-08-12 DIAGNOSIS — I442 Atrioventricular block, complete: Secondary | ICD-10-CM | POA: Diagnosis not present

## 2023-08-13 LAB — CUP PACEART REMOTE DEVICE CHECK
Battery Remaining Longevity: 156 mo
Battery Remaining Percentage: 100 %
Brady Statistic RA Percent Paced: 88 %
Brady Statistic RV Percent Paced: 100 %
Date Time Interrogation Session: 20250418044100
Implantable Lead Connection Status: 753985
Implantable Lead Connection Status: 753985
Implantable Lead Implant Date: 20240702
Implantable Lead Implant Date: 20240702
Implantable Lead Location: 753859
Implantable Lead Location: 753860
Implantable Lead Model: 7841
Implantable Lead Model: 7842
Implantable Lead Serial Number: 1337013
Implantable Lead Serial Number: 1458949
Implantable Pulse Generator Implant Date: 20240702
Lead Channel Impedance Value: 609 Ohm
Lead Channel Impedance Value: 609 Ohm
Lead Channel Setting Pacing Amplitude: 2 V
Lead Channel Setting Pacing Amplitude: 2.5 V
Lead Channel Setting Pacing Pulse Width: 0.4 ms
Lead Channel Setting Sensing Sensitivity: 2.5 mV
Pulse Gen Serial Number: 680078
Zone Setting Status: 755011

## 2023-08-16 ENCOUNTER — Encounter (HOSPITAL_COMMUNITY): Payer: Medicare Other

## 2023-08-16 ENCOUNTER — Encounter: Payer: Self-pay | Admitting: Internal Medicine

## 2023-08-16 DIAGNOSIS — E1165 Type 2 diabetes mellitus with hyperglycemia: Secondary | ICD-10-CM | POA: Diagnosis not present

## 2023-08-16 DIAGNOSIS — E559 Vitamin D deficiency, unspecified: Secondary | ICD-10-CM | POA: Diagnosis not present

## 2023-08-16 DIAGNOSIS — I4821 Permanent atrial fibrillation: Secondary | ICD-10-CM | POA: Diagnosis not present

## 2023-08-16 DIAGNOSIS — M1990 Unspecified osteoarthritis, unspecified site: Secondary | ICD-10-CM | POA: Diagnosis not present

## 2023-08-16 DIAGNOSIS — G43909 Migraine, unspecified, not intractable, without status migrainosus: Secondary | ICD-10-CM | POA: Diagnosis not present

## 2023-08-16 DIAGNOSIS — E1122 Type 2 diabetes mellitus with diabetic chronic kidney disease: Secondary | ICD-10-CM | POA: Diagnosis not present

## 2023-08-16 DIAGNOSIS — I422 Other hypertrophic cardiomyopathy: Secondary | ICD-10-CM | POA: Diagnosis not present

## 2023-08-16 DIAGNOSIS — N1832 Chronic kidney disease, stage 3b: Secondary | ICD-10-CM | POA: Diagnosis not present

## 2023-08-16 DIAGNOSIS — S7291XD Unspecified fracture of right femur, subsequent encounter for closed fracture with routine healing: Secondary | ICD-10-CM | POA: Diagnosis not present

## 2023-08-16 DIAGNOSIS — I129 Hypertensive chronic kidney disease with stage 1 through stage 4 chronic kidney disease, or unspecified chronic kidney disease: Secondary | ICD-10-CM | POA: Diagnosis not present

## 2023-08-17 ENCOUNTER — Telehealth: Payer: Self-pay | Admitting: Cardiovascular Disease

## 2023-08-17 ENCOUNTER — Ambulatory Visit (INDEPENDENT_AMBULATORY_CARE_PROVIDER_SITE_OTHER): Payer: Self-pay | Admitting: *Deleted

## 2023-08-17 DIAGNOSIS — Z952 Presence of prosthetic heart valve: Secondary | ICD-10-CM | POA: Diagnosis not present

## 2023-08-17 DIAGNOSIS — Z5181 Encounter for therapeutic drug level monitoring: Secondary | ICD-10-CM

## 2023-08-17 DIAGNOSIS — I4891 Unspecified atrial fibrillation: Secondary | ICD-10-CM | POA: Diagnosis not present

## 2023-08-17 LAB — POCT INR: INR: 2.7 (ref 2.0–3.0)

## 2023-08-17 NOTE — Telephone Encounter (Signed)
 Spoke with [employer].  Coumadin  orders given.  See coumadin  note.

## 2023-08-17 NOTE — Telephone Encounter (Signed)
 Caller Bud Care) wants a call back from [employer] regarding patient's INR check regimen.

## 2023-08-17 NOTE — Patient Instructions (Signed)
 S/P surgery Rt hip after fall.  Been in rehab.  Home now with [facility] Pt has a full bottle of 2.5mg  tablets.  We will change to those Continue warfarin 1 tablet daily except 1/2 tablet on Mondays and Thursdays On amiodarone 200mg  daily Recheck in 1 wk Orders given to [facility]

## 2023-08-19 DIAGNOSIS — I129 Hypertensive chronic kidney disease with stage 1 through stage 4 chronic kidney disease, or unspecified chronic kidney disease: Secondary | ICD-10-CM | POA: Diagnosis not present

## 2023-08-19 DIAGNOSIS — E1165 Type 2 diabetes mellitus with hyperglycemia: Secondary | ICD-10-CM | POA: Diagnosis not present

## 2023-08-19 DIAGNOSIS — S7291XD Unspecified fracture of right femur, subsequent encounter for closed fracture with routine healing: Secondary | ICD-10-CM | POA: Diagnosis not present

## 2023-08-19 DIAGNOSIS — I4821 Permanent atrial fibrillation: Secondary | ICD-10-CM | POA: Diagnosis not present

## 2023-08-19 DIAGNOSIS — N1832 Chronic kidney disease, stage 3b: Secondary | ICD-10-CM | POA: Diagnosis not present

## 2023-08-19 DIAGNOSIS — I422 Other hypertrophic cardiomyopathy: Secondary | ICD-10-CM | POA: Diagnosis not present

## 2023-08-19 DIAGNOSIS — E1122 Type 2 diabetes mellitus with diabetic chronic kidney disease: Secondary | ICD-10-CM | POA: Diagnosis not present

## 2023-08-19 DIAGNOSIS — E559 Vitamin D deficiency, unspecified: Secondary | ICD-10-CM | POA: Diagnosis not present

## 2023-08-19 DIAGNOSIS — M1990 Unspecified osteoarthritis, unspecified site: Secondary | ICD-10-CM | POA: Diagnosis not present

## 2023-08-19 DIAGNOSIS — G43909 Migraine, unspecified, not intractable, without status migrainosus: Secondary | ICD-10-CM | POA: Diagnosis not present

## 2023-08-22 ENCOUNTER — Telehealth: Payer: Self-pay | Admitting: Cardiovascular Disease

## 2023-08-22 DIAGNOSIS — I129 Hypertensive chronic kidney disease with stage 1 through stage 4 chronic kidney disease, or unspecified chronic kidney disease: Secondary | ICD-10-CM | POA: Diagnosis not present

## 2023-08-22 DIAGNOSIS — E1122 Type 2 diabetes mellitus with diabetic chronic kidney disease: Secondary | ICD-10-CM | POA: Diagnosis not present

## 2023-08-22 DIAGNOSIS — I10 Essential (primary) hypertension: Secondary | ICD-10-CM

## 2023-08-22 DIAGNOSIS — I4821 Permanent atrial fibrillation: Secondary | ICD-10-CM | POA: Diagnosis not present

## 2023-08-22 DIAGNOSIS — G43909 Migraine, unspecified, not intractable, without status migrainosus: Secondary | ICD-10-CM | POA: Diagnosis not present

## 2023-08-22 DIAGNOSIS — E559 Vitamin D deficiency, unspecified: Secondary | ICD-10-CM | POA: Diagnosis not present

## 2023-08-22 DIAGNOSIS — I422 Other hypertrophic cardiomyopathy: Secondary | ICD-10-CM | POA: Diagnosis not present

## 2023-08-22 DIAGNOSIS — E1165 Type 2 diabetes mellitus with hyperglycemia: Secondary | ICD-10-CM | POA: Diagnosis not present

## 2023-08-22 DIAGNOSIS — N1832 Chronic kidney disease, stage 3b: Secondary | ICD-10-CM | POA: Diagnosis not present

## 2023-08-22 DIAGNOSIS — M1990 Unspecified osteoarthritis, unspecified site: Secondary | ICD-10-CM | POA: Diagnosis not present

## 2023-08-22 DIAGNOSIS — S7291XD Unspecified fracture of right femur, subsequent encounter for closed fracture with routine healing: Secondary | ICD-10-CM | POA: Diagnosis not present

## 2023-08-22 NOTE — Telephone Encounter (Signed)
  Pt c/o BP issue: STAT if pt c/o blurred vision, one-sided weakness or slurred speech.  STAT if BP is GREATER than 180/120 TODAY.  STAT if BP is LESS than 90/60 and SYMPTOMATIC TODAY  1. What is your BP concern? Leah PT with [facility], pt's BP is elevated. She said, to call her and the patient for recommendation   2. Have you taken any BP medication today? yes   3. What are your last 5 BP readings?  sitting 182/96 5 mins   later 164/96 5 mins   later 165/95 10 mins later 179/99  4. Are you having any other symptoms (ex. Dizziness, headache, blurred vision, passed out)? No symptoms

## 2023-08-22 NOTE — Telephone Encounter (Signed)
 I will forward to Dr.Nishan for recommendations

## 2023-08-23 MED ORDER — LOSARTAN POTASSIUM 50 MG PO TABS: 50.0000 mg | ORAL_TABLET | Freq: Every day | ORAL | 3 refills | Status: AC

## 2023-08-23 NOTE — Telephone Encounter (Signed)
 Order faxed to [facility] at [phone]

## 2023-08-23 NOTE — Telephone Encounter (Addendum)
 Patient and HHN notified of medication chang and the need for labwork next week.

## 2023-08-24 ENCOUNTER — Ambulatory Visit (INDEPENDENT_AMBULATORY_CARE_PROVIDER_SITE_OTHER): Payer: Self-pay

## 2023-08-24 DIAGNOSIS — Z952 Presence of prosthetic heart valve: Secondary | ICD-10-CM | POA: Diagnosis not present

## 2023-08-24 DIAGNOSIS — E1122 Type 2 diabetes mellitus with diabetic chronic kidney disease: Secondary | ICD-10-CM | POA: Diagnosis not present

## 2023-08-24 DIAGNOSIS — Z5181 Encounter for therapeutic drug level monitoring: Secondary | ICD-10-CM

## 2023-08-24 DIAGNOSIS — E1165 Type 2 diabetes mellitus with hyperglycemia: Secondary | ICD-10-CM | POA: Diagnosis not present

## 2023-08-24 DIAGNOSIS — E559 Vitamin D deficiency, unspecified: Secondary | ICD-10-CM | POA: Diagnosis not present

## 2023-08-24 DIAGNOSIS — G43909 Migraine, unspecified, not intractable, without status migrainosus: Secondary | ICD-10-CM | POA: Diagnosis not present

## 2023-08-24 DIAGNOSIS — M1990 Unspecified osteoarthritis, unspecified site: Secondary | ICD-10-CM | POA: Diagnosis not present

## 2023-08-24 DIAGNOSIS — N1832 Chronic kidney disease, stage 3b: Secondary | ICD-10-CM | POA: Diagnosis not present

## 2023-08-24 DIAGNOSIS — I422 Other hypertrophic cardiomyopathy: Secondary | ICD-10-CM | POA: Diagnosis not present

## 2023-08-24 DIAGNOSIS — I4821 Permanent atrial fibrillation: Secondary | ICD-10-CM | POA: Diagnosis not present

## 2023-08-24 DIAGNOSIS — S7291XD Unspecified fracture of right femur, subsequent encounter for closed fracture with routine healing: Secondary | ICD-10-CM | POA: Diagnosis not present

## 2023-08-24 DIAGNOSIS — I129 Hypertensive chronic kidney disease with stage 1 through stage 4 chronic kidney disease, or unspecified chronic kidney disease: Secondary | ICD-10-CM | POA: Diagnosis not present

## 2023-08-24 LAB — POCT INR: INR: 2.8 (ref 2.0–3.0)

## 2023-08-24 NOTE — Telephone Encounter (Signed)
 INR result noted, see anticoagulation note in Epic.  Mallory, [facility] RN states pt has not started increased dosage of Cozaar  yet. Pt is going to start today and Mollory will check labs after she has been on increased dosage x 1 week.

## 2023-08-24 NOTE — Telephone Encounter (Signed)
 Per Mallory 2.8 INR for today

## 2023-08-24 NOTE — Patient Instructions (Signed)
 Description   S/P surgery Rt hip after fall.  Been in rehab.  Home now with [facility] Pt has a full bottle of 2.5mg  tablets.  We will change to those Continue warfarin 1 tablet daily except 1/2 tablet on Mondays and Thursdays On amiodarone  200mg  daily Recheck in 2 wks Orders given to [facility]

## 2023-08-26 DIAGNOSIS — E1165 Type 2 diabetes mellitus with hyperglycemia: Secondary | ICD-10-CM | POA: Diagnosis not present

## 2023-08-26 DIAGNOSIS — E559 Vitamin D deficiency, unspecified: Secondary | ICD-10-CM | POA: Diagnosis not present

## 2023-08-26 DIAGNOSIS — E1122 Type 2 diabetes mellitus with diabetic chronic kidney disease: Secondary | ICD-10-CM | POA: Diagnosis not present

## 2023-08-26 DIAGNOSIS — S7291XD Unspecified fracture of right femur, subsequent encounter for closed fracture with routine healing: Secondary | ICD-10-CM | POA: Diagnosis not present

## 2023-08-26 DIAGNOSIS — M1990 Unspecified osteoarthritis, unspecified site: Secondary | ICD-10-CM | POA: Diagnosis not present

## 2023-08-26 DIAGNOSIS — I129 Hypertensive chronic kidney disease with stage 1 through stage 4 chronic kidney disease, or unspecified chronic kidney disease: Secondary | ICD-10-CM | POA: Diagnosis not present

## 2023-08-26 DIAGNOSIS — I422 Other hypertrophic cardiomyopathy: Secondary | ICD-10-CM | POA: Diagnosis not present

## 2023-08-26 DIAGNOSIS — I4821 Permanent atrial fibrillation: Secondary | ICD-10-CM | POA: Diagnosis not present

## 2023-08-26 DIAGNOSIS — N1832 Chronic kidney disease, stage 3b: Secondary | ICD-10-CM | POA: Diagnosis not present

## 2023-08-26 DIAGNOSIS — G43909 Migraine, unspecified, not intractable, without status migrainosus: Secondary | ICD-10-CM | POA: Diagnosis not present

## 2023-08-29 ENCOUNTER — Telehealth: Payer: Self-pay | Admitting: Pharmacy Technician

## 2023-08-29 DIAGNOSIS — S7291XD Unspecified fracture of right femur, subsequent encounter for closed fracture with routine healing: Secondary | ICD-10-CM | POA: Diagnosis not present

## 2023-08-29 DIAGNOSIS — I4821 Permanent atrial fibrillation: Secondary | ICD-10-CM | POA: Diagnosis not present

## 2023-08-29 DIAGNOSIS — M1990 Unspecified osteoarthritis, unspecified site: Secondary | ICD-10-CM | POA: Diagnosis not present

## 2023-08-29 DIAGNOSIS — E1122 Type 2 diabetes mellitus with diabetic chronic kidney disease: Secondary | ICD-10-CM | POA: Diagnosis not present

## 2023-08-29 DIAGNOSIS — I422 Other hypertrophic cardiomyopathy: Secondary | ICD-10-CM | POA: Diagnosis not present

## 2023-08-29 DIAGNOSIS — E1165 Type 2 diabetes mellitus with hyperglycemia: Secondary | ICD-10-CM | POA: Diagnosis not present

## 2023-08-29 DIAGNOSIS — I129 Hypertensive chronic kidney disease with stage 1 through stage 4 chronic kidney disease, or unspecified chronic kidney disease: Secondary | ICD-10-CM | POA: Diagnosis not present

## 2023-08-29 DIAGNOSIS — E559 Vitamin D deficiency, unspecified: Secondary | ICD-10-CM | POA: Diagnosis not present

## 2023-08-29 DIAGNOSIS — N1832 Chronic kidney disease, stage 3b: Secondary | ICD-10-CM | POA: Diagnosis not present

## 2023-08-29 DIAGNOSIS — G43909 Migraine, unspecified, not intractable, without status migrainosus: Secondary | ICD-10-CM | POA: Diagnosis not present

## 2023-08-29 MED ORDER — WARFARIN SODIUM 2.5 MG PO TABS: ORAL_TABLET | ORAL | 1 refills | Status: DC

## 2023-08-29 NOTE — Addendum Note (Signed)
 Addended by: Lia Rede on: 08/29/2023 02:46 PM   Modules accepted: Orders

## 2023-08-29 NOTE — Telephone Encounter (Signed)
 Refill request for warfarin:  Last INR was 2.8 on 08/24/23 Next INR due 09/07/23 LOV was 02/08/23    Refill approved.

## 2023-08-29 NOTE — Telephone Encounter (Signed)
 We received refill request in our onbase. I scanned in media and put scan here

## 2023-08-31 ENCOUNTER — Encounter: Payer: Self-pay | Admitting: Cardiovascular Disease

## 2023-08-31 ENCOUNTER — Other Ambulatory Visit: Payer: Self-pay | Admitting: Internal Medicine

## 2023-09-01 DIAGNOSIS — I422 Other hypertrophic cardiomyopathy: Secondary | ICD-10-CM | POA: Diagnosis not present

## 2023-09-01 DIAGNOSIS — E559 Vitamin D deficiency, unspecified: Secondary | ICD-10-CM | POA: Diagnosis not present

## 2023-09-01 DIAGNOSIS — E1122 Type 2 diabetes mellitus with diabetic chronic kidney disease: Secondary | ICD-10-CM | POA: Diagnosis not present

## 2023-09-01 DIAGNOSIS — E1165 Type 2 diabetes mellitus with hyperglycemia: Secondary | ICD-10-CM | POA: Diagnosis not present

## 2023-09-01 DIAGNOSIS — M1990 Unspecified osteoarthritis, unspecified site: Secondary | ICD-10-CM | POA: Diagnosis not present

## 2023-09-01 DIAGNOSIS — G43909 Migraine, unspecified, not intractable, without status migrainosus: Secondary | ICD-10-CM | POA: Diagnosis not present

## 2023-09-01 DIAGNOSIS — I4821 Permanent atrial fibrillation: Secondary | ICD-10-CM | POA: Diagnosis not present

## 2023-09-01 DIAGNOSIS — I129 Hypertensive chronic kidney disease with stage 1 through stage 4 chronic kidney disease, or unspecified chronic kidney disease: Secondary | ICD-10-CM | POA: Diagnosis not present

## 2023-09-01 DIAGNOSIS — N1832 Chronic kidney disease, stage 3b: Secondary | ICD-10-CM | POA: Diagnosis not present

## 2023-09-01 DIAGNOSIS — S7291XD Unspecified fracture of right femur, subsequent encounter for closed fracture with routine healing: Secondary | ICD-10-CM | POA: Diagnosis not present

## 2023-09-05 DIAGNOSIS — M1990 Unspecified osteoarthritis, unspecified site: Secondary | ICD-10-CM | POA: Diagnosis not present

## 2023-09-05 DIAGNOSIS — S7291XD Unspecified fracture of right femur, subsequent encounter for closed fracture with routine healing: Secondary | ICD-10-CM | POA: Diagnosis not present

## 2023-09-05 DIAGNOSIS — E559 Vitamin D deficiency, unspecified: Secondary | ICD-10-CM | POA: Diagnosis not present

## 2023-09-05 DIAGNOSIS — N1832 Chronic kidney disease, stage 3b: Secondary | ICD-10-CM | POA: Diagnosis not present

## 2023-09-05 DIAGNOSIS — G43909 Migraine, unspecified, not intractable, without status migrainosus: Secondary | ICD-10-CM | POA: Diagnosis not present

## 2023-09-05 DIAGNOSIS — I422 Other hypertrophic cardiomyopathy: Secondary | ICD-10-CM | POA: Diagnosis not present

## 2023-09-05 DIAGNOSIS — E1122 Type 2 diabetes mellitus with diabetic chronic kidney disease: Secondary | ICD-10-CM | POA: Diagnosis not present

## 2023-09-05 DIAGNOSIS — E1165 Type 2 diabetes mellitus with hyperglycemia: Secondary | ICD-10-CM | POA: Diagnosis not present

## 2023-09-05 DIAGNOSIS — I4821 Permanent atrial fibrillation: Secondary | ICD-10-CM | POA: Diagnosis not present

## 2023-09-05 DIAGNOSIS — I129 Hypertensive chronic kidney disease with stage 1 through stage 4 chronic kidney disease, or unspecified chronic kidney disease: Secondary | ICD-10-CM | POA: Diagnosis not present

## 2023-09-06 ENCOUNTER — Encounter: Payer: Self-pay | Admitting: Cardiovascular Disease

## 2023-09-07 DIAGNOSIS — E1165 Type 2 diabetes mellitus with hyperglycemia: Secondary | ICD-10-CM | POA: Diagnosis not present

## 2023-09-07 DIAGNOSIS — E1122 Type 2 diabetes mellitus with diabetic chronic kidney disease: Secondary | ICD-10-CM | POA: Diagnosis not present

## 2023-09-07 DIAGNOSIS — S7291XD Unspecified fracture of right femur, subsequent encounter for closed fracture with routine healing: Secondary | ICD-10-CM | POA: Diagnosis not present

## 2023-09-07 DIAGNOSIS — E559 Vitamin D deficiency, unspecified: Secondary | ICD-10-CM | POA: Diagnosis not present

## 2023-09-07 DIAGNOSIS — I4821 Permanent atrial fibrillation: Secondary | ICD-10-CM | POA: Diagnosis not present

## 2023-09-07 DIAGNOSIS — N1832 Chronic kidney disease, stage 3b: Secondary | ICD-10-CM | POA: Diagnosis not present

## 2023-09-07 DIAGNOSIS — M1990 Unspecified osteoarthritis, unspecified site: Secondary | ICD-10-CM | POA: Diagnosis not present

## 2023-09-07 DIAGNOSIS — G43909 Migraine, unspecified, not intractable, without status migrainosus: Secondary | ICD-10-CM | POA: Diagnosis not present

## 2023-09-07 DIAGNOSIS — I422 Other hypertrophic cardiomyopathy: Secondary | ICD-10-CM | POA: Diagnosis not present

## 2023-09-07 DIAGNOSIS — I129 Hypertensive chronic kidney disease with stage 1 through stage 4 chronic kidney disease, or unspecified chronic kidney disease: Secondary | ICD-10-CM | POA: Diagnosis not present

## 2023-09-09 ENCOUNTER — Telehealth: Payer: Self-pay

## 2023-09-09 NOTE — Progress Notes (Signed)
 CARDIOLOGY CONSULT NOTE       Patient ID: [name] MRN: [id number] DOB/AGE: Jul 07, 1950 73 y.o.  Admit date: (Not on file) Referring Physician: Glady Laming Primary Physician: Minus Amel, MD Primary Cardiologist: Not seen since 2022 Reason for Consultation: AVR/MVR  Virtual Visit via Video Note  I connected with [name] on 09/15/23 at  3:00 PM EDT by a video enabled telemedicine application and verified that I am speaking with the correct person using two identifiers.  Location: Patient: Home Provider: Office   I discussed the limitations of evaluation and management by telemedicine and the availability of in person appointments. The patient expressed understanding and agreed to proceed.  HPI:  73 y.o. complicated history of HOCM/AS. When I last saw her discussed medical Rx with disopyramide/mavacamten, AVR with septal myectomy or TAVR with alcohol septal ablation. 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 Post op had PAF and heart block with PPM placement. I am assuming that a mechanical MVR chosen due to lower profile and to avoid LVOT obstruction with HOCM/myectomy. She was cardioverted before hospital d/c   Had recurrent afib and had TEE/DCC 01/31/24.  Seen by Dr Carolynne Citron 02/08/23 in [facility] on amiodarone  and coumadin .   She fell and broke her hip 06/11/23 Had surgery with Dr Curtiss Dowdy and went to rehab. No back home. She has been seeing [facility] to get her INR checked recently   Will call [facility] Crumbly about arranging home INR monitor Discussed needing labs/PFTls for amiodarone   Recent Hct 26.2 encouraged to f/u with primary and resume iron  tablets  ROS All other systems reviewed and negative except as noted above  Past Medical History:  Diagnosis Date   Aortic stenosis    21 mm Inspiris Edwards AVR June 2024 - [facility]   Arthritis    Cervical incompetence     GERD (gastroesophageal reflux disease)    Hay fever    Heart block    [employer] DCPPM on 10/26/2022 with Dr. Reather Campbell - [facility]   Hemochromatosis    HOCM (hypertrophic obstructive cardiomyopathy) (HCC)    Septal myomectomy June 2024 - [facility]   Hypertension    Migraines    Mitral stenosis    25 mm Regent mechanical MVR June 2024 - [facility]   Paroxysmal atrial fibrillation (HCC)    Sciatica    Type 2 diabetes mellitus (HCC)     Family History  Problem Relation Age of Onset   Heart attack Paternal Grandfather    Migraines Maternal Grandmother    Heart attack Maternal Grandfather    Heart attack Father    Heart murmur Father    Cancer Mother        pancreatic cancer   Heart murmur Brother    Breast cancer Maternal Aunt     Social History   Socioeconomic History   Marital status: Married    Spouse name: Not on file   Number of children: Not on file   Years of education: Not on file   Highest education level: Not on file  Occupational History   Not on file  Tobacco Use   Smoking status: Former    Current packs/day: 0.00    Average packs/day: 0.1 packs/day for 7.0 years (0.7 ttl pk-yrs)    Types: Cigarettes    Start date: 09/16/1962    Quit date: 09/15/1969    Years  since quitting: 54.0   Smokeless tobacco: Never   Tobacco comments:    as a teenager  Vaping Use   Vaping status: Never Used  Substance and Sexual Activity   Alcohol use: Not Currently   Drug use: No   Sexual activity: Not Currently    Partners: Female    Birth control/protection: Post-menopausal    Comment: BTL  Other Topics Concern   Not on file  Social History Narrative   Not on file   Social Drivers of Health   Financial Resource Strain: Not on file  Food Insecurity: No Food Insecurity (06/12/2023)   Hunger Vital Sign    Worried About Running Out of Food in the Last Year: Never true    Ran Out of Food in the Last Year: Never true  Transportation Needs: No Transportation Needs (06/12/2023)    PRAPARE - Administrator, Civil Service (Medical): No    Lack of Transportation (Non-Medical): No  Physical Activity: Not on file  Stress: Not on file  Social Connections: Socially Integrated (06/12/2023)   Social Connection and Isolation Panel [NHANES]    Frequency of Communication with Friends and Family: More than three times a week    Frequency of Social Gatherings with Friends and Family: Twice a week    Attends Religious Services: 1 to 4 times per year    Active Member of [employer] or Organizations: Yes    Attends Banker Meetings: 1 to 4 times per year    Marital Status: Married  Catering manager Violence: Not At Risk (06/12/2023)   Humiliation, Afraid, Rape, and Kick questionnaire    Fear of Current or Ex-Partner: No    Emotionally Abused: No    Physically Abused: No    Sexually Abused: No    Past Surgical History:  Procedure Laterality Date   CARDIAC SURGERY     CARDIOVERSION N/A 02/02/2023   Procedure: CARDIOVERSION;  Surgeon: Lasalle Pointer, MD;  Location: [facility] ORS;  Service: Cardiovascular;  Laterality: N/A;   CARPAL TUNNEL RELEASE Right    COLONOSCOPY N/A 08/01/2014   Procedure: COLONOSCOPY;  Surgeon: Ruby Corporal, MD;  Location: [facility] ENDO SUITE;  Service: Endoscopy;  Laterality: N/A;  900 -- moved to 10:00 - Ann notified pt   ELBOW SURGERY     INTRAMEDULLARY (IM) NAIL INTERTROCHANTERIC Right 06/12/2023   Procedure: INTRAMEDULLARY (IM) NAIL INTERTROCHANTERIC;  Surgeon: Laneta Pintos, MD;  Location: [facility] OR;  Service: Orthopedics;  Laterality: Right;   PACEMAKER INSERTION     June 2024   RIGHT/LEFT HEART CATH AND CORONARY ANGIOGRAPHY N/A 07/28/2020   Procedure: RIGHT/LEFT HEART CATH AND CORONARY ANGIOGRAPHY;  Surgeon: Arnoldo Lapping, MD;  Location: [facility] INVASIVE CV LAB;  Service: Cardiovascular;  Laterality: N/A;   ROTATOR CUFF REPAIR Right 2012   TEE WITHOUT CARDIOVERSION N/A 02/02/2023   Procedure: TRANSESOPHAGEAL ECHOCARDIOGRAM (TEE);  Surgeon:  Mallipeddi, Vishnu P, MD;  Location: [facility] ORS;  Service: Cardiovascular;  Laterality: N/A;   TONSILLECTOMY     TOTAL KNEE ARTHROPLASTY Right 10/13/2021   Procedure: TOTAL KNEE ARTHROPLASTY;  Surgeon: Claiborne Crew, MD;  Location: [facility] ORS;  Service: Orthopedics;  Laterality: Right;   TUBAL LIGATION     WISDOM TOOTH EXTRACTION        Current Outpatient Medications:    acetaminophen  (TYLENOL ) 325 MG tablet, Take 2 tablets (650 mg total) by mouth every 6 (six) hours as needed for mild pain (pain score 1-3) (or Fever >/= 101)., Disp: , Rfl:  amiodarone  (PACERONE ) 200 MG tablet, Take 1 tablet (200 mg total) by mouth daily. Amiodarone  200 mg BID x 3 weeks followed by amiodarone  200 mg once daily. (Patient taking differently: Take 200 mg by mouth daily.), Disp: 90 tablet, Rfl: 3   ezetimibe  (ZETIA ) 10 MG tablet, Take 10 mg by mouth every morning., Disp: , Rfl:    ferrous sulfate  325 (65 FE) MG tablet, Take 1 tablet (325 mg total) by mouth daily with breakfast., Disp: 30 tablet, Rfl: 0   insulin  aspart (NOVOLOG ) 100 UNIT/ML FlexPen, Inject 0-6 Units into the skin 3 (three) times daily with meals. Check Blood Glucose (BG) and inject per scale: BG <150= 0 unit; BG 150-200= 1 unit; BG 201-250= 2 unit; BG 251-300= 3 unit; BG 301-350= 4 unit; BG 351-400= 5 unit; BG >400= 6 unit and Call Primary Care., Disp: , Rfl:    insulin  glargine (LANTUS ) 100 UNIT/ML Solostar Pen, Inject 12 Units into the skin daily. May substitute as needed per insurance., Disp: , Rfl:    losartan  (COZAAR ) 50 MG tablet, Take 1 tablet (50 mg total) by mouth daily., Disp: 90 tablet, Rfl: 3   magnesium  oxide (MAG-OX) 400 (240 Mg) MG tablet, Take 400 mg by mouth 2 (two) times daily., Disp: , Rfl:    melatonin 1 MG TABS tablet, Take 4 mg by mouth at bedtime as needed (sleep). gummies, Disp: , Rfl:    methocarbamol  (ROBAXIN ) 500 MG tablet, Take 1 tablet (500 mg total) by mouth every 8 (eight) hours as needed for muscle spasms., Disp: 30 tablet,  Rfl: 0   metoprolol  succinate (TOPROL -XL) 25 MG 24 hr tablet, Take 1 tablet (25 mg total) by mouth daily., Disp: 30 tablet, Rfl: 2   omeprazole (PRILOSEC) 20 MG capsule, Take 20 mg by mouth every morning., Disp: , Rfl: 3   oxyCODONE  (OXY IR/ROXICODONE ) 5 MG immediate release tablet, Take 1 tablet (5 mg total) by mouth every 4 (four) hours as needed for severe pain (pain score 7-10)., Disp: 42 tablet, Rfl: 0   polyethylene glycol (MIRALAX  / GLYCOLAX ) 17 g packet, Take 17 g by mouth daily., Disp: 14 each, Rfl: 0   senna (SENOKOT) 8.6 MG TABS tablet, Take 1 tablet (8.6 mg total) by mouth daily as needed for mild constipation., Disp: 30 tablet, Rfl: 0   Vitamin D , Ergocalciferol , (DRISDOL ) 1.25 MG (50000 UNIT) CAPS capsule, Take 1 capsule (50,000 Units total) by mouth every 7 (seven) days., Disp: 5 capsule, Rfl: 0   warfarin (COUMADIN ) 2.5 MG tablet, Take 1 tablet daily except 1/2 tablet on Mondays and Thursdays or as directed, Disp: 90 tablet, Rfl: 1    Physical Exam:  None telephone visit  Labs:   Lab Results  Component Value Date   WBC 11.6 (H) 06/16/2023   HGB 7.5 (L) 06/16/2023   HCT 22.9 (L) 06/16/2023   MCV 91.6 06/16/2023   PLT 273 06/16/2023     Radiology: CUP PACEART REMOTE DEVICE CHECK Result Date: 08/13/2023 Scheduled remote reviewed. Normal device function.  Presenting rhythm: AP/VP Next remote 91 days. ML, CVRS   EKG: AV paced 06/11/23   ASSESSMENT AND PLAN:   HOCM:  post myectomy and mechanical MVR no gradient on TEE done 06/11/23  PPM:  normal function f/u [facility] NSR PAF: on amiodarone  with most recent TEE/DCC 02/02/23 will check LFT;s/TSH/T4 PFTls and DLCO Anticoagulation: been checked by [facility]. Will discuss with [facility] coumadin  clinic regarding home machine  DM:  Discussed low carb diet.  Target hemoglobin  A1c is 6.5 or less.  Continue current medications. HLD:  on zetia . No obstructive CAD on cath before valve surgery. Intolerant to statins with myalgias Need  labs Ortho:  post fall with right femur fracture Xrays 06/12/23 looked good continue home rehab F/U Dr Curtiss Dowdy  Amiodarone  labs TSH/T4 LFTls PFT;s with DLCO Lipids F/U INR with [facility] Crumbly to try and arrange home machine PPM checks with Dr Carolynne Citron  F/U with me in a year   Signed: Janelle Mediate 09/09/2023, 2:34 PM

## 2023-09-09 NOTE — Telephone Encounter (Signed)
 INR was due on Tuesday. Called [facility] to determine why INR has not been checked. The nurse that usually checks pt's INR is on vacation; however, home health agency states they will check to see who was assigned to check pt's INR this week. Will wait for call back.

## 2023-09-13 ENCOUNTER — Telehealth: Payer: Self-pay | Admitting: Cardiovascular Disease

## 2023-09-13 NOTE — Telephone Encounter (Signed)
 New Message:    Patient has an appointment with Dr Stann Earnest on Thursday(09-15-23) She had a terrible fall. She wants to know if she could do a Virtual Visit please?

## 2023-09-13 NOTE — Telephone Encounter (Signed)
 Appointment switched to VV- Telephone d/t fall. Ok per provider

## 2023-09-14 ENCOUNTER — Ambulatory Visit (INDEPENDENT_AMBULATORY_CARE_PROVIDER_SITE_OTHER): Admitting: *Deleted

## 2023-09-14 DIAGNOSIS — Z5181 Encounter for therapeutic drug level monitoring: Secondary | ICD-10-CM | POA: Diagnosis not present

## 2023-09-14 DIAGNOSIS — Z9181 History of falling: Secondary | ICD-10-CM | POA: Diagnosis not present

## 2023-09-14 DIAGNOSIS — E1169 Type 2 diabetes mellitus with other specified complication: Secondary | ICD-10-CM | POA: Diagnosis not present

## 2023-09-14 DIAGNOSIS — Z87891 Personal history of nicotine dependence: Secondary | ICD-10-CM | POA: Diagnosis not present

## 2023-09-14 DIAGNOSIS — Z9089 Acquired absence of other organs: Secondary | ICD-10-CM | POA: Diagnosis not present

## 2023-09-14 DIAGNOSIS — Z950 Presence of cardiac pacemaker: Secondary | ICD-10-CM | POA: Diagnosis not present

## 2023-09-14 DIAGNOSIS — E1122 Type 2 diabetes mellitus with diabetic chronic kidney disease: Secondary | ICD-10-CM | POA: Diagnosis not present

## 2023-09-14 DIAGNOSIS — Z96651 Presence of right artificial knee joint: Secondary | ICD-10-CM | POA: Diagnosis not present

## 2023-09-14 DIAGNOSIS — I459 Conduction disorder, unspecified: Secondary | ICD-10-CM | POA: Diagnosis not present

## 2023-09-14 DIAGNOSIS — Z952 Presence of prosthetic heart valve: Secondary | ICD-10-CM | POA: Diagnosis not present

## 2023-09-14 DIAGNOSIS — E785 Hyperlipidemia, unspecified: Secondary | ICD-10-CM | POA: Diagnosis not present

## 2023-09-14 DIAGNOSIS — I4821 Permanent atrial fibrillation: Secondary | ICD-10-CM | POA: Diagnosis not present

## 2023-09-14 DIAGNOSIS — I421 Obstructive hypertrophic cardiomyopathy: Secondary | ICD-10-CM | POA: Diagnosis not present

## 2023-09-14 DIAGNOSIS — K219 Gastro-esophageal reflux disease without esophagitis: Secondary | ICD-10-CM | POA: Diagnosis not present

## 2023-09-14 DIAGNOSIS — G43909 Migraine, unspecified, not intractable, without status migrainosus: Secondary | ICD-10-CM | POA: Diagnosis not present

## 2023-09-14 DIAGNOSIS — I129 Hypertensive chronic kidney disease with stage 1 through stage 4 chronic kidney disease, or unspecified chronic kidney disease: Secondary | ICD-10-CM | POA: Diagnosis not present

## 2023-09-14 DIAGNOSIS — M1990 Unspecified osteoarthritis, unspecified site: Secondary | ICD-10-CM | POA: Diagnosis not present

## 2023-09-14 DIAGNOSIS — Z7984 Long term (current) use of oral hypoglycemic drugs: Secondary | ICD-10-CM | POA: Diagnosis not present

## 2023-09-14 DIAGNOSIS — Z8781 Personal history of (healed) traumatic fracture: Secondary | ICD-10-CM | POA: Diagnosis not present

## 2023-09-14 DIAGNOSIS — I4891 Unspecified atrial fibrillation: Secondary | ICD-10-CM | POA: Diagnosis not present

## 2023-09-14 DIAGNOSIS — N1832 Chronic kidney disease, stage 3b: Secondary | ICD-10-CM | POA: Diagnosis not present

## 2023-09-14 DIAGNOSIS — Z7901 Long term (current) use of anticoagulants: Secondary | ICD-10-CM | POA: Diagnosis not present

## 2023-09-14 LAB — POCT INR: INR: 2.5 (ref 2.0–3.0)

## 2023-09-14 NOTE — Patient Instructions (Signed)
 S/P surgery Rt hip after fall.  Been in rehab.  Home now with [facility] Continue warfarin 1 tablet daily except 1/2 tablet on Mondays and Thursdays On amiodarone  200mg  daily Recheck in 1 wk Orders given to [facility]

## 2023-09-15 ENCOUNTER — Telehealth: Payer: Self-pay

## 2023-09-15 ENCOUNTER — Ambulatory Visit: Attending: Cardiovascular Disease | Admitting: Cardiovascular Disease

## 2023-09-15 ENCOUNTER — Encounter: Payer: Self-pay | Admitting: Cardiovascular Disease

## 2023-09-15 VITALS — Ht 68.0 in | Wt 215.0 lb

## 2023-09-15 DIAGNOSIS — Z7984 Long term (current) use of oral hypoglycemic drugs: Secondary | ICD-10-CM | POA: Diagnosis not present

## 2023-09-15 DIAGNOSIS — Z9181 History of falling: Secondary | ICD-10-CM | POA: Diagnosis not present

## 2023-09-15 DIAGNOSIS — E1169 Type 2 diabetes mellitus with other specified complication: Secondary | ICD-10-CM | POA: Diagnosis not present

## 2023-09-15 DIAGNOSIS — I4892 Unspecified atrial flutter: Secondary | ICD-10-CM

## 2023-09-15 DIAGNOSIS — I459 Conduction disorder, unspecified: Secondary | ICD-10-CM | POA: Diagnosis not present

## 2023-09-15 DIAGNOSIS — Z7901 Long term (current) use of anticoagulants: Secondary | ICD-10-CM | POA: Diagnosis not present

## 2023-09-15 DIAGNOSIS — Z9089 Acquired absence of other organs: Secondary | ICD-10-CM | POA: Diagnosis not present

## 2023-09-15 DIAGNOSIS — I10 Essential (primary) hypertension: Secondary | ICD-10-CM | POA: Diagnosis not present

## 2023-09-15 DIAGNOSIS — Z950 Presence of cardiac pacemaker: Secondary | ICD-10-CM

## 2023-09-15 DIAGNOSIS — M1990 Unspecified osteoarthritis, unspecified site: Secondary | ICD-10-CM | POA: Diagnosis not present

## 2023-09-15 DIAGNOSIS — I442 Atrioventricular block, complete: Secondary | ICD-10-CM

## 2023-09-15 DIAGNOSIS — Z96651 Presence of right artificial knee joint: Secondary | ICD-10-CM | POA: Diagnosis not present

## 2023-09-15 DIAGNOSIS — K219 Gastro-esophageal reflux disease without esophagitis: Secondary | ICD-10-CM | POA: Diagnosis not present

## 2023-09-15 DIAGNOSIS — Z5181 Encounter for therapeutic drug level monitoring: Secondary | ICD-10-CM | POA: Diagnosis not present

## 2023-09-15 DIAGNOSIS — Z79899 Other long term (current) drug therapy: Secondary | ICD-10-CM

## 2023-09-15 DIAGNOSIS — E1122 Type 2 diabetes mellitus with diabetic chronic kidney disease: Secondary | ICD-10-CM | POA: Diagnosis not present

## 2023-09-15 DIAGNOSIS — Z952 Presence of prosthetic heart valve: Secondary | ICD-10-CM

## 2023-09-15 DIAGNOSIS — I4821 Permanent atrial fibrillation: Secondary | ICD-10-CM | POA: Diagnosis not present

## 2023-09-15 DIAGNOSIS — N1832 Chronic kidney disease, stage 3b: Secondary | ICD-10-CM | POA: Diagnosis not present

## 2023-09-15 DIAGNOSIS — I421 Obstructive hypertrophic cardiomyopathy: Secondary | ICD-10-CM | POA: Diagnosis not present

## 2023-09-15 DIAGNOSIS — I129 Hypertensive chronic kidney disease with stage 1 through stage 4 chronic kidney disease, or unspecified chronic kidney disease: Secondary | ICD-10-CM | POA: Diagnosis not present

## 2023-09-15 DIAGNOSIS — G43909 Migraine, unspecified, not intractable, without status migrainosus: Secondary | ICD-10-CM | POA: Diagnosis not present

## 2023-09-15 DIAGNOSIS — Z87891 Personal history of nicotine dependence: Secondary | ICD-10-CM | POA: Diagnosis not present

## 2023-09-15 DIAGNOSIS — E785 Hyperlipidemia, unspecified: Secondary | ICD-10-CM | POA: Diagnosis not present

## 2023-09-15 DIAGNOSIS — Z8781 Personal history of (healed) traumatic fracture: Secondary | ICD-10-CM | POA: Diagnosis not present

## 2023-09-15 MED ORDER — METOPROLOL SUCCINATE ER 25 MG PO TB24: 25.0000 mg | ORAL_TABLET | Freq: Every day | ORAL | 2 refills | Status: DC

## 2023-09-15 NOTE — Telephone Encounter (Signed)
  Patient Consent for Virtual Visit        [name] has provided verbal consent on 09/15/2023 for a virtual visit (video or telephone).   CONSENT FOR VIRTUAL VISIT FOR:  [name]  By participating in this virtual visit I agree to the following:  I hereby voluntarily request, consent and authorize [facility] and its employed or contracted physicians, physician assistants, nurse practitioners or other licensed health care professionals (the Practitioner), to provide me with telemedicine health care services (the "Services") as deemed necessary by the treating Practitioner. I acknowledge and consent to receive the Services by the Practitioner via telemedicine. I understand that the telemedicine visit will involve communicating with the Practitioner through live audiovisual communication technology and the disclosure of certain medical information by electronic transmission. I acknowledge that I have been given the opportunity to request an in-person assessment or other available alternative prior to the telemedicine visit and am voluntarily participating in the telemedicine visit.  I understand that I have the right to withhold or withdraw my consent to the use of telemedicine in the course of my care at any time, without affecting my right to future care or treatment, and that the Practitioner or I may terminate the telemedicine visit at any time. I understand that I have the right to inspect all information obtained and/or recorded in the course of the telemedicine visit and may receive copies of available information for a reasonable fee.  I understand that some of the potential risks of receiving the Services via telemedicine include:  Delay or interruption in medical evaluation due to technological equipment failure or disruption; Information transmitted may not be sufficient (e.g. poor resolution of images) to allow for appropriate medical decision making by the Practitioner;  and/or  In rare instances, security protocols could fail, causing a breach of personal health information.  Furthermore, I acknowledge that it is my responsibility to provide information about my medical history, conditions and care that is complete and accurate to the best of my ability. I acknowledge that Practitioner's advice, recommendations, and/or decision may be based on factors not within their control, such as incomplete or inaccurate data provided by me or distortions of diagnostic images or specimens that may result from electronic transmissions. I understand that the practice of medicine is not an exact science and that Practitioner makes no warranties or guarantees regarding treatment outcomes. I acknowledge that a copy of this consent can be made available to me via my patient portal [facility] MyChart), or I can request a printed copy by calling the office of [facility].    I understand that my insurance will be billed for this visit.   I have read or had this consent read to me. I understand the contents of this consent, which adequately explains the benefits and risks of the Services being provided via telemedicine.  I have been provided ample opportunity to ask questions regarding this consent and the Services and have had my questions answered to my satisfaction. I give my informed consent for the services to be provided through the use of telemedicine in my medical care

## 2023-09-15 NOTE — Patient Instructions (Addendum)
 Medication Instructions:   Your physician recommends that you continue on your current medications as directed. Please refer to the Current Medication list given to you today.    Labwork: Lft's,tsh,t4  Testing/Procedures: Your physician has recommended that you have a pulmonary function test. Pulmonary Function Tests are a group of tests that measure how well air moves in and out of your lungs. Due in 1 month   Follow-Up: 1 year  Any Other Special Instructions Will Be Listed Below (If Applicable).  If you need a refill on your cardiac medications before your next appointment, please call your pharmacy.

## 2023-09-15 NOTE — Addendum Note (Signed)
 Addended by: Shakina Choy A on: 09/15/2023 03:14 PM   Modules accepted: Orders

## 2023-09-20 ENCOUNTER — Telehealth: Payer: Self-pay | Admitting: Cardiovascular Disease

## 2023-09-20 NOTE — Telephone Encounter (Signed)
 patient says she had an accident and she is home bound and unable to go to the office for INR check. She says Dr. Stann Earnest informed her that Kenard Paul., RN can assist with getting a home monitor to check INR. Patient would like assistance with this.

## 2023-09-21 ENCOUNTER — Ambulatory Visit (INDEPENDENT_AMBULATORY_CARE_PROVIDER_SITE_OTHER): Payer: Self-pay | Admitting: *Deleted

## 2023-09-21 ENCOUNTER — Encounter: Payer: Self-pay | Admitting: Cardiovascular Disease

## 2023-09-21 DIAGNOSIS — G43909 Migraine, unspecified, not intractable, without status migrainosus: Secondary | ICD-10-CM | POA: Diagnosis not present

## 2023-09-21 DIAGNOSIS — Z8781 Personal history of (healed) traumatic fracture: Secondary | ICD-10-CM | POA: Diagnosis not present

## 2023-09-21 DIAGNOSIS — N1832 Chronic kidney disease, stage 3b: Secondary | ICD-10-CM | POA: Diagnosis not present

## 2023-09-21 DIAGNOSIS — Z7901 Long term (current) use of anticoagulants: Secondary | ICD-10-CM | POA: Diagnosis not present

## 2023-09-21 DIAGNOSIS — I459 Conduction disorder, unspecified: Secondary | ICD-10-CM | POA: Diagnosis not present

## 2023-09-21 DIAGNOSIS — I129 Hypertensive chronic kidney disease with stage 1 through stage 4 chronic kidney disease, or unspecified chronic kidney disease: Secondary | ICD-10-CM | POA: Diagnosis not present

## 2023-09-21 DIAGNOSIS — I4891 Unspecified atrial fibrillation: Secondary | ICD-10-CM | POA: Diagnosis not present

## 2023-09-21 DIAGNOSIS — Z96651 Presence of right artificial knee joint: Secondary | ICD-10-CM | POA: Diagnosis not present

## 2023-09-21 DIAGNOSIS — Z950 Presence of cardiac pacemaker: Secondary | ICD-10-CM | POA: Diagnosis not present

## 2023-09-21 DIAGNOSIS — Z9181 History of falling: Secondary | ICD-10-CM | POA: Diagnosis not present

## 2023-09-21 DIAGNOSIS — Z5181 Encounter for therapeutic drug level monitoring: Secondary | ICD-10-CM

## 2023-09-21 DIAGNOSIS — E785 Hyperlipidemia, unspecified: Secondary | ICD-10-CM | POA: Diagnosis not present

## 2023-09-21 DIAGNOSIS — E1122 Type 2 diabetes mellitus with diabetic chronic kidney disease: Secondary | ICD-10-CM | POA: Diagnosis not present

## 2023-09-21 DIAGNOSIS — M1990 Unspecified osteoarthritis, unspecified site: Secondary | ICD-10-CM | POA: Diagnosis not present

## 2023-09-21 DIAGNOSIS — Z7984 Long term (current) use of oral hypoglycemic drugs: Secondary | ICD-10-CM | POA: Diagnosis not present

## 2023-09-21 DIAGNOSIS — I4821 Permanent atrial fibrillation: Secondary | ICD-10-CM | POA: Diagnosis not present

## 2023-09-21 DIAGNOSIS — Z87891 Personal history of nicotine dependence: Secondary | ICD-10-CM | POA: Diagnosis not present

## 2023-09-21 DIAGNOSIS — Z952 Presence of prosthetic heart valve: Secondary | ICD-10-CM | POA: Diagnosis not present

## 2023-09-21 DIAGNOSIS — Z9089 Acquired absence of other organs: Secondary | ICD-10-CM | POA: Diagnosis not present

## 2023-09-21 DIAGNOSIS — I421 Obstructive hypertrophic cardiomyopathy: Secondary | ICD-10-CM | POA: Diagnosis not present

## 2023-09-21 DIAGNOSIS — E1169 Type 2 diabetes mellitus with other specified complication: Secondary | ICD-10-CM | POA: Diagnosis not present

## 2023-09-21 DIAGNOSIS — K219 Gastro-esophageal reflux disease without esophagitis: Secondary | ICD-10-CM | POA: Diagnosis not present

## 2023-09-21 LAB — POCT INR: INR: 3 (ref 2.0–3.0)

## 2023-09-21 NOTE — Telephone Encounter (Signed)
 Paperwork request for home monitoring faxed to MD/INR this morning.  LMOM for pt that company should be contacting her to set up a time to educate her on how to use machine when it is available.

## 2023-09-21 NOTE — Progress Notes (Signed)
 Remote pacemaker transmission.

## 2023-09-21 NOTE — Patient Instructions (Signed)
 S/P surgery Rt hip after fall.  Been in rehab.  Home now with [facility] Continue warfarin 1 tablet daily except 1/2 tablet on Mondays and Thursdays On amiodarone  200mg  daily Recheck in 1 wk Orders given to [facility]

## 2023-09-21 NOTE — Telephone Encounter (Signed)
 error

## 2023-09-21 NOTE — Addendum Note (Signed)
 Addended by: Lott Rouleau A on: 09/21/2023 09:10 AM   Modules accepted: Orders

## 2023-09-23 DIAGNOSIS — Z8781 Personal history of (healed) traumatic fracture: Secondary | ICD-10-CM | POA: Diagnosis not present

## 2023-09-23 DIAGNOSIS — G43909 Migraine, unspecified, not intractable, without status migrainosus: Secondary | ICD-10-CM | POA: Diagnosis not present

## 2023-09-23 DIAGNOSIS — Z7984 Long term (current) use of oral hypoglycemic drugs: Secondary | ICD-10-CM | POA: Diagnosis not present

## 2023-09-23 DIAGNOSIS — N1832 Chronic kidney disease, stage 3b: Secondary | ICD-10-CM | POA: Diagnosis not present

## 2023-09-23 DIAGNOSIS — E1122 Type 2 diabetes mellitus with diabetic chronic kidney disease: Secondary | ICD-10-CM | POA: Diagnosis not present

## 2023-09-23 DIAGNOSIS — Z7901 Long term (current) use of anticoagulants: Secondary | ICD-10-CM | POA: Diagnosis not present

## 2023-09-23 DIAGNOSIS — E1169 Type 2 diabetes mellitus with other specified complication: Secondary | ICD-10-CM | POA: Diagnosis not present

## 2023-09-23 DIAGNOSIS — Z9089 Acquired absence of other organs: Secondary | ICD-10-CM | POA: Diagnosis not present

## 2023-09-23 DIAGNOSIS — Z5181 Encounter for therapeutic drug level monitoring: Secondary | ICD-10-CM | POA: Diagnosis not present

## 2023-09-23 DIAGNOSIS — I459 Conduction disorder, unspecified: Secondary | ICD-10-CM | POA: Diagnosis not present

## 2023-09-23 DIAGNOSIS — Z950 Presence of cardiac pacemaker: Secondary | ICD-10-CM | POA: Diagnosis not present

## 2023-09-23 DIAGNOSIS — Z952 Presence of prosthetic heart valve: Secondary | ICD-10-CM | POA: Diagnosis not present

## 2023-09-23 DIAGNOSIS — I4821 Permanent atrial fibrillation: Secondary | ICD-10-CM | POA: Diagnosis not present

## 2023-09-23 DIAGNOSIS — I421 Obstructive hypertrophic cardiomyopathy: Secondary | ICD-10-CM | POA: Diagnosis not present

## 2023-09-23 DIAGNOSIS — Z9181 History of falling: Secondary | ICD-10-CM | POA: Diagnosis not present

## 2023-09-23 DIAGNOSIS — Z96651 Presence of right artificial knee joint: Secondary | ICD-10-CM | POA: Diagnosis not present

## 2023-09-23 DIAGNOSIS — K219 Gastro-esophageal reflux disease without esophagitis: Secondary | ICD-10-CM | POA: Diagnosis not present

## 2023-09-23 DIAGNOSIS — Z87891 Personal history of nicotine dependence: Secondary | ICD-10-CM | POA: Diagnosis not present

## 2023-09-23 DIAGNOSIS — E785 Hyperlipidemia, unspecified: Secondary | ICD-10-CM | POA: Diagnosis not present

## 2023-09-23 DIAGNOSIS — I129 Hypertensive chronic kidney disease with stage 1 through stage 4 chronic kidney disease, or unspecified chronic kidney disease: Secondary | ICD-10-CM | POA: Diagnosis not present

## 2023-09-23 DIAGNOSIS — M1990 Unspecified osteoarthritis, unspecified site: Secondary | ICD-10-CM | POA: Diagnosis not present

## 2023-09-26 DIAGNOSIS — I421 Obstructive hypertrophic cardiomyopathy: Secondary | ICD-10-CM | POA: Diagnosis not present

## 2023-09-26 DIAGNOSIS — E1169 Type 2 diabetes mellitus with other specified complication: Secondary | ICD-10-CM | POA: Diagnosis not present

## 2023-09-26 DIAGNOSIS — I459 Conduction disorder, unspecified: Secondary | ICD-10-CM | POA: Diagnosis not present

## 2023-09-26 DIAGNOSIS — Z950 Presence of cardiac pacemaker: Secondary | ICD-10-CM | POA: Diagnosis not present

## 2023-09-26 DIAGNOSIS — I4821 Permanent atrial fibrillation: Secondary | ICD-10-CM | POA: Diagnosis not present

## 2023-09-26 DIAGNOSIS — Z952 Presence of prosthetic heart valve: Secondary | ICD-10-CM | POA: Diagnosis not present

## 2023-09-26 DIAGNOSIS — K219 Gastro-esophageal reflux disease without esophagitis: Secondary | ICD-10-CM | POA: Diagnosis not present

## 2023-09-26 DIAGNOSIS — M1990 Unspecified osteoarthritis, unspecified site: Secondary | ICD-10-CM | POA: Diagnosis not present

## 2023-09-26 DIAGNOSIS — Z9089 Acquired absence of other organs: Secondary | ICD-10-CM | POA: Diagnosis not present

## 2023-09-26 DIAGNOSIS — E1122 Type 2 diabetes mellitus with diabetic chronic kidney disease: Secondary | ICD-10-CM | POA: Diagnosis not present

## 2023-09-26 DIAGNOSIS — N1832 Chronic kidney disease, stage 3b: Secondary | ICD-10-CM | POA: Diagnosis not present

## 2023-09-26 DIAGNOSIS — Z8781 Personal history of (healed) traumatic fracture: Secondary | ICD-10-CM | POA: Diagnosis not present

## 2023-09-26 DIAGNOSIS — Z96651 Presence of right artificial knee joint: Secondary | ICD-10-CM | POA: Diagnosis not present

## 2023-09-26 DIAGNOSIS — Z9181 History of falling: Secondary | ICD-10-CM | POA: Diagnosis not present

## 2023-09-26 DIAGNOSIS — G43909 Migraine, unspecified, not intractable, without status migrainosus: Secondary | ICD-10-CM | POA: Diagnosis not present

## 2023-09-26 DIAGNOSIS — Z87891 Personal history of nicotine dependence: Secondary | ICD-10-CM | POA: Diagnosis not present

## 2023-09-26 DIAGNOSIS — Z7984 Long term (current) use of oral hypoglycemic drugs: Secondary | ICD-10-CM | POA: Diagnosis not present

## 2023-09-26 DIAGNOSIS — Z7901 Long term (current) use of anticoagulants: Secondary | ICD-10-CM | POA: Diagnosis not present

## 2023-09-26 DIAGNOSIS — I129 Hypertensive chronic kidney disease with stage 1 through stage 4 chronic kidney disease, or unspecified chronic kidney disease: Secondary | ICD-10-CM | POA: Diagnosis not present

## 2023-09-26 DIAGNOSIS — Z5181 Encounter for therapeutic drug level monitoring: Secondary | ICD-10-CM | POA: Diagnosis not present

## 2023-09-26 DIAGNOSIS — E785 Hyperlipidemia, unspecified: Secondary | ICD-10-CM | POA: Diagnosis not present

## 2023-09-28 DIAGNOSIS — Z950 Presence of cardiac pacemaker: Secondary | ICD-10-CM | POA: Diagnosis not present

## 2023-09-28 DIAGNOSIS — Z87891 Personal history of nicotine dependence: Secondary | ICD-10-CM | POA: Diagnosis not present

## 2023-09-28 DIAGNOSIS — Z9181 History of falling: Secondary | ICD-10-CM | POA: Diagnosis not present

## 2023-09-28 DIAGNOSIS — G43909 Migraine, unspecified, not intractable, without status migrainosus: Secondary | ICD-10-CM | POA: Diagnosis not present

## 2023-09-28 DIAGNOSIS — Z8781 Personal history of (healed) traumatic fracture: Secondary | ICD-10-CM | POA: Diagnosis not present

## 2023-09-28 DIAGNOSIS — K219 Gastro-esophageal reflux disease without esophagitis: Secondary | ICD-10-CM | POA: Diagnosis not present

## 2023-09-28 DIAGNOSIS — I129 Hypertensive chronic kidney disease with stage 1 through stage 4 chronic kidney disease, or unspecified chronic kidney disease: Secondary | ICD-10-CM | POA: Diagnosis not present

## 2023-09-28 DIAGNOSIS — Z7901 Long term (current) use of anticoagulants: Secondary | ICD-10-CM | POA: Diagnosis not present

## 2023-09-28 DIAGNOSIS — I4821 Permanent atrial fibrillation: Secondary | ICD-10-CM | POA: Diagnosis not present

## 2023-09-28 DIAGNOSIS — N1832 Chronic kidney disease, stage 3b: Secondary | ICD-10-CM | POA: Diagnosis not present

## 2023-09-28 DIAGNOSIS — I421 Obstructive hypertrophic cardiomyopathy: Secondary | ICD-10-CM | POA: Diagnosis not present

## 2023-09-28 DIAGNOSIS — Z9089 Acquired absence of other organs: Secondary | ICD-10-CM | POA: Diagnosis not present

## 2023-09-28 DIAGNOSIS — I459 Conduction disorder, unspecified: Secondary | ICD-10-CM | POA: Diagnosis not present

## 2023-09-28 DIAGNOSIS — Z7984 Long term (current) use of oral hypoglycemic drugs: Secondary | ICD-10-CM | POA: Diagnosis not present

## 2023-09-28 DIAGNOSIS — M1990 Unspecified osteoarthritis, unspecified site: Secondary | ICD-10-CM | POA: Diagnosis not present

## 2023-09-28 DIAGNOSIS — Z96651 Presence of right artificial knee joint: Secondary | ICD-10-CM | POA: Diagnosis not present

## 2023-09-28 DIAGNOSIS — Z5181 Encounter for therapeutic drug level monitoring: Secondary | ICD-10-CM | POA: Diagnosis not present

## 2023-09-28 DIAGNOSIS — E1122 Type 2 diabetes mellitus with diabetic chronic kidney disease: Secondary | ICD-10-CM | POA: Diagnosis not present

## 2023-09-28 DIAGNOSIS — E1169 Type 2 diabetes mellitus with other specified complication: Secondary | ICD-10-CM | POA: Diagnosis not present

## 2023-09-28 DIAGNOSIS — Z952 Presence of prosthetic heart valve: Secondary | ICD-10-CM | POA: Diagnosis not present

## 2023-09-28 DIAGNOSIS — E785 Hyperlipidemia, unspecified: Secondary | ICD-10-CM | POA: Diagnosis not present

## 2023-10-03 LAB — PROTIME-INR: INR: 4.2 — AB (ref 0.80–1.20)

## 2023-10-04 ENCOUNTER — Ambulatory Visit (INDEPENDENT_AMBULATORY_CARE_PROVIDER_SITE_OTHER): Admitting: *Deleted

## 2023-10-04 DIAGNOSIS — Z8781 Personal history of (healed) traumatic fracture: Secondary | ICD-10-CM | POA: Diagnosis not present

## 2023-10-04 DIAGNOSIS — G43909 Migraine, unspecified, not intractable, without status migrainosus: Secondary | ICD-10-CM | POA: Diagnosis not present

## 2023-10-04 DIAGNOSIS — I459 Conduction disorder, unspecified: Secondary | ICD-10-CM | POA: Diagnosis not present

## 2023-10-04 DIAGNOSIS — Z950 Presence of cardiac pacemaker: Secondary | ICD-10-CM | POA: Diagnosis not present

## 2023-10-04 DIAGNOSIS — Z87891 Personal history of nicotine dependence: Secondary | ICD-10-CM | POA: Diagnosis not present

## 2023-10-04 DIAGNOSIS — M1990 Unspecified osteoarthritis, unspecified site: Secondary | ICD-10-CM | POA: Diagnosis not present

## 2023-10-04 DIAGNOSIS — E1122 Type 2 diabetes mellitus with diabetic chronic kidney disease: Secondary | ICD-10-CM | POA: Diagnosis not present

## 2023-10-04 DIAGNOSIS — N1832 Chronic kidney disease, stage 3b: Secondary | ICD-10-CM | POA: Diagnosis not present

## 2023-10-04 DIAGNOSIS — Z5181 Encounter for therapeutic drug level monitoring: Secondary | ICD-10-CM | POA: Diagnosis not present

## 2023-10-04 DIAGNOSIS — Z9089 Acquired absence of other organs: Secondary | ICD-10-CM | POA: Diagnosis not present

## 2023-10-04 DIAGNOSIS — Z7901 Long term (current) use of anticoagulants: Secondary | ICD-10-CM | POA: Diagnosis not present

## 2023-10-04 DIAGNOSIS — Z96651 Presence of right artificial knee joint: Secondary | ICD-10-CM | POA: Diagnosis not present

## 2023-10-04 DIAGNOSIS — Z952 Presence of prosthetic heart valve: Secondary | ICD-10-CM | POA: Diagnosis not present

## 2023-10-04 DIAGNOSIS — I421 Obstructive hypertrophic cardiomyopathy: Secondary | ICD-10-CM | POA: Diagnosis not present

## 2023-10-04 DIAGNOSIS — E785 Hyperlipidemia, unspecified: Secondary | ICD-10-CM | POA: Diagnosis not present

## 2023-10-04 DIAGNOSIS — I350 Nonrheumatic aortic (valve) stenosis: Secondary | ICD-10-CM | POA: Diagnosis not present

## 2023-10-04 DIAGNOSIS — I4821 Permanent atrial fibrillation: Secondary | ICD-10-CM | POA: Diagnosis not present

## 2023-10-04 DIAGNOSIS — E1169 Type 2 diabetes mellitus with other specified complication: Secondary | ICD-10-CM | POA: Diagnosis not present

## 2023-10-04 DIAGNOSIS — K219 Gastro-esophageal reflux disease without esophagitis: Secondary | ICD-10-CM | POA: Diagnosis not present

## 2023-10-04 DIAGNOSIS — Z7984 Long term (current) use of oral hypoglycemic drugs: Secondary | ICD-10-CM | POA: Diagnosis not present

## 2023-10-04 DIAGNOSIS — I129 Hypertensive chronic kidney disease with stage 1 through stage 4 chronic kidney disease, or unspecified chronic kidney disease: Secondary | ICD-10-CM | POA: Diagnosis not present

## 2023-10-04 DIAGNOSIS — Z9181 History of falling: Secondary | ICD-10-CM | POA: Diagnosis not present

## 2023-10-04 NOTE — Patient Instructions (Signed)
 S/P surgery Rt hip after fall.  Been in rehab.  Home now with [facility] Hold warfarin tonight then resume 1 tablet daily except 1/2 tablet on Mondays and Thursdays On amiodarone  200mg  daily Recheck in 1 wk Orders given to [facility]

## 2023-10-06 DIAGNOSIS — Z96651 Presence of right artificial knee joint: Secondary | ICD-10-CM | POA: Diagnosis not present

## 2023-10-06 DIAGNOSIS — M1990 Unspecified osteoarthritis, unspecified site: Secondary | ICD-10-CM | POA: Diagnosis not present

## 2023-10-06 DIAGNOSIS — Z950 Presence of cardiac pacemaker: Secondary | ICD-10-CM | POA: Diagnosis not present

## 2023-10-06 DIAGNOSIS — Z8781 Personal history of (healed) traumatic fracture: Secondary | ICD-10-CM | POA: Diagnosis not present

## 2023-10-06 DIAGNOSIS — G43909 Migraine, unspecified, not intractable, without status migrainosus: Secondary | ICD-10-CM | POA: Diagnosis not present

## 2023-10-06 DIAGNOSIS — Z5181 Encounter for therapeutic drug level monitoring: Secondary | ICD-10-CM | POA: Diagnosis not present

## 2023-10-06 DIAGNOSIS — Z7901 Long term (current) use of anticoagulants: Secondary | ICD-10-CM | POA: Diagnosis not present

## 2023-10-06 DIAGNOSIS — E1169 Type 2 diabetes mellitus with other specified complication: Secondary | ICD-10-CM | POA: Diagnosis not present

## 2023-10-06 DIAGNOSIS — I459 Conduction disorder, unspecified: Secondary | ICD-10-CM | POA: Diagnosis not present

## 2023-10-06 DIAGNOSIS — I4821 Permanent atrial fibrillation: Secondary | ICD-10-CM | POA: Diagnosis not present

## 2023-10-06 DIAGNOSIS — K219 Gastro-esophageal reflux disease without esophagitis: Secondary | ICD-10-CM | POA: Diagnosis not present

## 2023-10-06 DIAGNOSIS — I129 Hypertensive chronic kidney disease with stage 1 through stage 4 chronic kidney disease, or unspecified chronic kidney disease: Secondary | ICD-10-CM | POA: Diagnosis not present

## 2023-10-06 DIAGNOSIS — Z952 Presence of prosthetic heart valve: Secondary | ICD-10-CM | POA: Diagnosis not present

## 2023-10-06 DIAGNOSIS — I421 Obstructive hypertrophic cardiomyopathy: Secondary | ICD-10-CM | POA: Diagnosis not present

## 2023-10-06 DIAGNOSIS — Z9181 History of falling: Secondary | ICD-10-CM | POA: Diagnosis not present

## 2023-10-06 DIAGNOSIS — Z9089 Acquired absence of other organs: Secondary | ICD-10-CM | POA: Diagnosis not present

## 2023-10-06 DIAGNOSIS — Z7984 Long term (current) use of oral hypoglycemic drugs: Secondary | ICD-10-CM | POA: Diagnosis not present

## 2023-10-06 DIAGNOSIS — E785 Hyperlipidemia, unspecified: Secondary | ICD-10-CM | POA: Diagnosis not present

## 2023-10-06 DIAGNOSIS — Z87891 Personal history of nicotine dependence: Secondary | ICD-10-CM | POA: Diagnosis not present

## 2023-10-06 DIAGNOSIS — E1122 Type 2 diabetes mellitus with diabetic chronic kidney disease: Secondary | ICD-10-CM | POA: Diagnosis not present

## 2023-10-06 DIAGNOSIS — N1832 Chronic kidney disease, stage 3b: Secondary | ICD-10-CM | POA: Diagnosis not present

## 2023-10-10 ENCOUNTER — Ambulatory Visit (INDEPENDENT_AMBULATORY_CARE_PROVIDER_SITE_OTHER)

## 2023-10-10 DIAGNOSIS — Z96651 Presence of right artificial knee joint: Secondary | ICD-10-CM | POA: Diagnosis not present

## 2023-10-10 DIAGNOSIS — Z952 Presence of prosthetic heart valve: Secondary | ICD-10-CM | POA: Diagnosis not present

## 2023-10-10 DIAGNOSIS — I421 Obstructive hypertrophic cardiomyopathy: Secondary | ICD-10-CM | POA: Diagnosis not present

## 2023-10-10 DIAGNOSIS — E1122 Type 2 diabetes mellitus with diabetic chronic kidney disease: Secondary | ICD-10-CM | POA: Diagnosis not present

## 2023-10-10 DIAGNOSIS — G43909 Migraine, unspecified, not intractable, without status migrainosus: Secondary | ICD-10-CM | POA: Diagnosis not present

## 2023-10-10 DIAGNOSIS — Z7984 Long term (current) use of oral hypoglycemic drugs: Secondary | ICD-10-CM | POA: Diagnosis not present

## 2023-10-10 DIAGNOSIS — Z8781 Personal history of (healed) traumatic fracture: Secondary | ICD-10-CM | POA: Diagnosis not present

## 2023-10-10 DIAGNOSIS — Z7901 Long term (current) use of anticoagulants: Secondary | ICD-10-CM | POA: Diagnosis not present

## 2023-10-10 DIAGNOSIS — Z87891 Personal history of nicotine dependence: Secondary | ICD-10-CM | POA: Diagnosis not present

## 2023-10-10 DIAGNOSIS — M1990 Unspecified osteoarthritis, unspecified site: Secondary | ICD-10-CM | POA: Diagnosis not present

## 2023-10-10 DIAGNOSIS — Z5181 Encounter for therapeutic drug level monitoring: Secondary | ICD-10-CM

## 2023-10-10 DIAGNOSIS — I4821 Permanent atrial fibrillation: Secondary | ICD-10-CM | POA: Diagnosis not present

## 2023-10-10 DIAGNOSIS — N1832 Chronic kidney disease, stage 3b: Secondary | ICD-10-CM | POA: Diagnosis not present

## 2023-10-10 DIAGNOSIS — I129 Hypertensive chronic kidney disease with stage 1 through stage 4 chronic kidney disease, or unspecified chronic kidney disease: Secondary | ICD-10-CM | POA: Diagnosis not present

## 2023-10-10 DIAGNOSIS — K219 Gastro-esophageal reflux disease without esophagitis: Secondary | ICD-10-CM | POA: Diagnosis not present

## 2023-10-10 DIAGNOSIS — I459 Conduction disorder, unspecified: Secondary | ICD-10-CM | POA: Diagnosis not present

## 2023-10-10 DIAGNOSIS — E785 Hyperlipidemia, unspecified: Secondary | ICD-10-CM | POA: Diagnosis not present

## 2023-10-10 DIAGNOSIS — Z950 Presence of cardiac pacemaker: Secondary | ICD-10-CM | POA: Diagnosis not present

## 2023-10-10 DIAGNOSIS — Z9181 History of falling: Secondary | ICD-10-CM | POA: Diagnosis not present

## 2023-10-10 DIAGNOSIS — Z9089 Acquired absence of other organs: Secondary | ICD-10-CM | POA: Diagnosis not present

## 2023-10-10 DIAGNOSIS — E1169 Type 2 diabetes mellitus with other specified complication: Secondary | ICD-10-CM | POA: Diagnosis not present

## 2023-10-10 LAB — POCT INR: INR: 2.5 (ref 2.0–3.0)

## 2023-10-10 NOTE — Patient Instructions (Signed)
 Description   Called and spoke with pt and pt's home health nurse, Kathaleen Pale. Instructed to take 1 tablet today and then resume taking 1 tablet daily except 1/2 tablet on Mondays and Thursdays *amiodarone  200mg  daily* Recheck in 1 week.  Orders given to Armandina [facility] - pt is being discharged from [facility]; however, pt states she is being set up with new agency.

## 2023-10-11 DIAGNOSIS — Z7901 Long term (current) use of anticoagulants: Secondary | ICD-10-CM | POA: Diagnosis not present

## 2023-10-11 DIAGNOSIS — G43909 Migraine, unspecified, not intractable, without status migrainosus: Secondary | ICD-10-CM | POA: Diagnosis not present

## 2023-10-11 DIAGNOSIS — E785 Hyperlipidemia, unspecified: Secondary | ICD-10-CM | POA: Diagnosis not present

## 2023-10-11 DIAGNOSIS — Z952 Presence of prosthetic heart valve: Secondary | ICD-10-CM | POA: Diagnosis not present

## 2023-10-11 DIAGNOSIS — M1990 Unspecified osteoarthritis, unspecified site: Secondary | ICD-10-CM | POA: Diagnosis not present

## 2023-10-11 DIAGNOSIS — E1122 Type 2 diabetes mellitus with diabetic chronic kidney disease: Secondary | ICD-10-CM | POA: Diagnosis not present

## 2023-10-11 DIAGNOSIS — Z87891 Personal history of nicotine dependence: Secondary | ICD-10-CM | POA: Diagnosis not present

## 2023-10-11 DIAGNOSIS — I421 Obstructive hypertrophic cardiomyopathy: Secondary | ICD-10-CM | POA: Diagnosis not present

## 2023-10-11 DIAGNOSIS — Z5181 Encounter for therapeutic drug level monitoring: Secondary | ICD-10-CM | POA: Diagnosis not present

## 2023-10-11 DIAGNOSIS — I459 Conduction disorder, unspecified: Secondary | ICD-10-CM | POA: Diagnosis not present

## 2023-10-11 DIAGNOSIS — Z7984 Long term (current) use of oral hypoglycemic drugs: Secondary | ICD-10-CM | POA: Diagnosis not present

## 2023-10-11 DIAGNOSIS — Z9181 History of falling: Secondary | ICD-10-CM | POA: Diagnosis not present

## 2023-10-11 DIAGNOSIS — Z8781 Personal history of (healed) traumatic fracture: Secondary | ICD-10-CM | POA: Diagnosis not present

## 2023-10-11 DIAGNOSIS — Z950 Presence of cardiac pacemaker: Secondary | ICD-10-CM | POA: Diagnosis not present

## 2023-10-11 DIAGNOSIS — K219 Gastro-esophageal reflux disease without esophagitis: Secondary | ICD-10-CM | POA: Diagnosis not present

## 2023-10-11 DIAGNOSIS — I4821 Permanent atrial fibrillation: Secondary | ICD-10-CM | POA: Diagnosis not present

## 2023-10-11 DIAGNOSIS — M96669 Fracture of femur following insertion of orthopedic implant, joint prosthesis, or bone plate, unspecified leg: Secondary | ICD-10-CM | POA: Diagnosis not present

## 2023-10-11 DIAGNOSIS — Z9089 Acquired absence of other organs: Secondary | ICD-10-CM | POA: Diagnosis not present

## 2023-10-11 DIAGNOSIS — E1169 Type 2 diabetes mellitus with other specified complication: Secondary | ICD-10-CM | POA: Diagnosis not present

## 2023-10-11 DIAGNOSIS — Z96651 Presence of right artificial knee joint: Secondary | ICD-10-CM | POA: Diagnosis not present

## 2023-10-11 DIAGNOSIS — N1832 Chronic kidney disease, stage 3b: Secondary | ICD-10-CM | POA: Diagnosis not present

## 2023-10-11 DIAGNOSIS — I129 Hypertensive chronic kidney disease with stage 1 through stage 4 chronic kidney disease, or unspecified chronic kidney disease: Secondary | ICD-10-CM | POA: Diagnosis not present

## 2023-10-13 DIAGNOSIS — I421 Obstructive hypertrophic cardiomyopathy: Secondary | ICD-10-CM | POA: Diagnosis not present

## 2023-10-13 DIAGNOSIS — K589 Irritable bowel syndrome without diarrhea: Secondary | ICD-10-CM | POA: Diagnosis not present

## 2023-10-13 DIAGNOSIS — I129 Hypertensive chronic kidney disease with stage 1 through stage 4 chronic kidney disease, or unspecified chronic kidney disease: Secondary | ICD-10-CM | POA: Diagnosis not present

## 2023-10-13 DIAGNOSIS — Z7984 Long term (current) use of oral hypoglycemic drugs: Secondary | ICD-10-CM | POA: Diagnosis not present

## 2023-10-13 DIAGNOSIS — G4700 Insomnia, unspecified: Secondary | ICD-10-CM | POA: Diagnosis not present

## 2023-10-13 DIAGNOSIS — Z7901 Long term (current) use of anticoagulants: Secondary | ICD-10-CM | POA: Diagnosis not present

## 2023-10-13 DIAGNOSIS — I4719 Other supraventricular tachycardia: Secondary | ICD-10-CM | POA: Diagnosis not present

## 2023-10-13 DIAGNOSIS — R350 Frequency of micturition: Secondary | ICD-10-CM | POA: Diagnosis not present

## 2023-10-13 DIAGNOSIS — Z87891 Personal history of nicotine dependence: Secondary | ICD-10-CM | POA: Diagnosis not present

## 2023-10-13 DIAGNOSIS — I3489 Other nonrheumatic mitral valve disorders: Secondary | ICD-10-CM | POA: Diagnosis not present

## 2023-10-13 DIAGNOSIS — Z556 Problems related to health literacy: Secondary | ICD-10-CM | POA: Diagnosis not present

## 2023-10-13 DIAGNOSIS — K219 Gastro-esophageal reflux disease without esophagitis: Secondary | ICD-10-CM | POA: Diagnosis not present

## 2023-10-13 DIAGNOSIS — N182 Chronic kidney disease, stage 2 (mild): Secondary | ICD-10-CM | POA: Diagnosis not present

## 2023-10-13 DIAGNOSIS — E1122 Type 2 diabetes mellitus with diabetic chronic kidney disease: Secondary | ICD-10-CM | POA: Diagnosis not present

## 2023-10-13 DIAGNOSIS — Q249 Congenital malformation of heart, unspecified: Secondary | ICD-10-CM | POA: Diagnosis not present

## 2023-10-13 DIAGNOSIS — Z9181 History of falling: Secondary | ICD-10-CM | POA: Diagnosis not present

## 2023-10-13 DIAGNOSIS — M96661 Fracture of femur following insertion of orthopedic implant, joint prosthesis, or bone plate, right leg: Secondary | ICD-10-CM | POA: Diagnosis not present

## 2023-10-13 DIAGNOSIS — M1990 Unspecified osteoarthritis, unspecified site: Secondary | ICD-10-CM | POA: Diagnosis not present

## 2023-10-13 DIAGNOSIS — Z87440 Personal history of urinary (tract) infections: Secondary | ICD-10-CM | POA: Diagnosis not present

## 2023-10-13 DIAGNOSIS — Z86018 Personal history of other benign neoplasm: Secondary | ICD-10-CM | POA: Diagnosis not present

## 2023-10-13 DIAGNOSIS — E785 Hyperlipidemia, unspecified: Secondary | ICD-10-CM | POA: Diagnosis not present

## 2023-10-13 DIAGNOSIS — I4891 Unspecified atrial fibrillation: Secondary | ICD-10-CM | POA: Diagnosis not present

## 2023-10-17 DIAGNOSIS — Q249 Congenital malformation of heart, unspecified: Secondary | ICD-10-CM | POA: Diagnosis not present

## 2023-10-17 DIAGNOSIS — M1990 Unspecified osteoarthritis, unspecified site: Secondary | ICD-10-CM | POA: Diagnosis not present

## 2023-10-17 DIAGNOSIS — Z9181 History of falling: Secondary | ICD-10-CM | POA: Diagnosis not present

## 2023-10-17 DIAGNOSIS — R350 Frequency of micturition: Secondary | ICD-10-CM | POA: Diagnosis not present

## 2023-10-17 DIAGNOSIS — Z86018 Personal history of other benign neoplasm: Secondary | ICD-10-CM | POA: Diagnosis not present

## 2023-10-17 DIAGNOSIS — Z556 Problems related to health literacy: Secondary | ICD-10-CM | POA: Diagnosis not present

## 2023-10-17 DIAGNOSIS — M96661 Fracture of femur following insertion of orthopedic implant, joint prosthesis, or bone plate, right leg: Secondary | ICD-10-CM | POA: Diagnosis not present

## 2023-10-17 DIAGNOSIS — Z7901 Long term (current) use of anticoagulants: Secondary | ICD-10-CM | POA: Diagnosis not present

## 2023-10-17 DIAGNOSIS — K589 Irritable bowel syndrome without diarrhea: Secondary | ICD-10-CM | POA: Diagnosis not present

## 2023-10-17 DIAGNOSIS — I3489 Other nonrheumatic mitral valve disorders: Secondary | ICD-10-CM | POA: Diagnosis not present

## 2023-10-17 DIAGNOSIS — Z87891 Personal history of nicotine dependence: Secondary | ICD-10-CM | POA: Diagnosis not present

## 2023-10-17 DIAGNOSIS — I421 Obstructive hypertrophic cardiomyopathy: Secondary | ICD-10-CM | POA: Diagnosis not present

## 2023-10-17 DIAGNOSIS — I4891 Unspecified atrial fibrillation: Secondary | ICD-10-CM | POA: Diagnosis not present

## 2023-10-17 DIAGNOSIS — I129 Hypertensive chronic kidney disease with stage 1 through stage 4 chronic kidney disease, or unspecified chronic kidney disease: Secondary | ICD-10-CM | POA: Diagnosis not present

## 2023-10-17 DIAGNOSIS — E1122 Type 2 diabetes mellitus with diabetic chronic kidney disease: Secondary | ICD-10-CM | POA: Diagnosis not present

## 2023-10-17 DIAGNOSIS — Z87440 Personal history of urinary (tract) infections: Secondary | ICD-10-CM | POA: Diagnosis not present

## 2023-10-17 DIAGNOSIS — I4719 Other supraventricular tachycardia: Secondary | ICD-10-CM | POA: Diagnosis not present

## 2023-10-17 DIAGNOSIS — E785 Hyperlipidemia, unspecified: Secondary | ICD-10-CM | POA: Diagnosis not present

## 2023-10-17 DIAGNOSIS — G4700 Insomnia, unspecified: Secondary | ICD-10-CM | POA: Diagnosis not present

## 2023-10-17 DIAGNOSIS — Z7984 Long term (current) use of oral hypoglycemic drugs: Secondary | ICD-10-CM | POA: Diagnosis not present

## 2023-10-17 DIAGNOSIS — N182 Chronic kidney disease, stage 2 (mild): Secondary | ICD-10-CM | POA: Diagnosis not present

## 2023-10-17 DIAGNOSIS — K219 Gastro-esophageal reflux disease without esophagitis: Secondary | ICD-10-CM | POA: Diagnosis not present

## 2023-10-19 ENCOUNTER — Telehealth: Payer: Self-pay | Admitting: *Deleted

## 2023-10-19 NOTE — Telephone Encounter (Signed)
 Called pt to see if home health had checked her INR this week and see if she had heard from [facility] where I faxed referral on 09/21/23 for home monitor per Dr Delford.  Pt states HH agency is changing to [facility].  They are suppose to come out this week. Requested INR be doe at that time with results to me.  Also refaxed referral to [facility] for home monitor.  Will follow up on Monday 10/24/23.

## 2023-10-20 ENCOUNTER — Telehealth: Payer: Self-pay | Admitting: Cardiovascular Disease

## 2023-10-20 ENCOUNTER — Ambulatory Visit (INDEPENDENT_AMBULATORY_CARE_PROVIDER_SITE_OTHER): Payer: Self-pay

## 2023-10-20 DIAGNOSIS — Z952 Presence of prosthetic heart valve: Secondary | ICD-10-CM | POA: Diagnosis not present

## 2023-10-20 DIAGNOSIS — I4719 Other supraventricular tachycardia: Secondary | ICD-10-CM | POA: Diagnosis not present

## 2023-10-20 DIAGNOSIS — N182 Chronic kidney disease, stage 2 (mild): Secondary | ICD-10-CM | POA: Diagnosis not present

## 2023-10-20 DIAGNOSIS — Z9181 History of falling: Secondary | ICD-10-CM | POA: Diagnosis not present

## 2023-10-20 DIAGNOSIS — Z7901 Long term (current) use of anticoagulants: Secondary | ICD-10-CM | POA: Diagnosis not present

## 2023-10-20 DIAGNOSIS — Z86018 Personal history of other benign neoplasm: Secondary | ICD-10-CM | POA: Diagnosis not present

## 2023-10-20 DIAGNOSIS — M1990 Unspecified osteoarthritis, unspecified site: Secondary | ICD-10-CM | POA: Diagnosis not present

## 2023-10-20 DIAGNOSIS — Z5181 Encounter for therapeutic drug level monitoring: Secondary | ICD-10-CM

## 2023-10-20 DIAGNOSIS — Z87440 Personal history of urinary (tract) infections: Secondary | ICD-10-CM | POA: Diagnosis not present

## 2023-10-20 DIAGNOSIS — Z7984 Long term (current) use of oral hypoglycemic drugs: Secondary | ICD-10-CM | POA: Diagnosis not present

## 2023-10-20 DIAGNOSIS — I129 Hypertensive chronic kidney disease with stage 1 through stage 4 chronic kidney disease, or unspecified chronic kidney disease: Secondary | ICD-10-CM | POA: Diagnosis not present

## 2023-10-20 DIAGNOSIS — R350 Frequency of micturition: Secondary | ICD-10-CM | POA: Diagnosis not present

## 2023-10-20 DIAGNOSIS — Q249 Congenital malformation of heart, unspecified: Secondary | ICD-10-CM | POA: Diagnosis not present

## 2023-10-20 DIAGNOSIS — G4700 Insomnia, unspecified: Secondary | ICD-10-CM | POA: Diagnosis not present

## 2023-10-20 DIAGNOSIS — K219 Gastro-esophageal reflux disease without esophagitis: Secondary | ICD-10-CM | POA: Diagnosis not present

## 2023-10-20 DIAGNOSIS — I421 Obstructive hypertrophic cardiomyopathy: Secondary | ICD-10-CM | POA: Diagnosis not present

## 2023-10-20 DIAGNOSIS — I4891 Unspecified atrial fibrillation: Secondary | ICD-10-CM | POA: Diagnosis not present

## 2023-10-20 DIAGNOSIS — Z556 Problems related to health literacy: Secondary | ICD-10-CM | POA: Diagnosis not present

## 2023-10-20 DIAGNOSIS — E785 Hyperlipidemia, unspecified: Secondary | ICD-10-CM | POA: Diagnosis not present

## 2023-10-20 DIAGNOSIS — Z87891 Personal history of nicotine dependence: Secondary | ICD-10-CM | POA: Diagnosis not present

## 2023-10-20 DIAGNOSIS — E1122 Type 2 diabetes mellitus with diabetic chronic kidney disease: Secondary | ICD-10-CM | POA: Diagnosis not present

## 2023-10-20 DIAGNOSIS — K589 Irritable bowel syndrome without diarrhea: Secondary | ICD-10-CM | POA: Diagnosis not present

## 2023-10-20 DIAGNOSIS — I3489 Other nonrheumatic mitral valve disorders: Secondary | ICD-10-CM | POA: Diagnosis not present

## 2023-10-20 DIAGNOSIS — M96661 Fracture of femur following insertion of orthopedic implant, joint prosthesis, or bone plate, right leg: Secondary | ICD-10-CM | POA: Diagnosis not present

## 2023-10-20 LAB — POCT INR: INR: 2.5 (ref 2.0–3.0)

## 2023-10-20 NOTE — Progress Notes (Signed)
Please see anticoagulation encounter.

## 2023-10-20 NOTE — Telephone Encounter (Signed)
 Sheryl with [facility] called to report INR was 2.5 today.

## 2023-10-20 NOTE — Patient Instructions (Signed)
 Description   Called and spoke with [facility] RN Channing. Instructed to take 1 tablet today and then resume taking 1 tablet daily except 1/2 tablet on Mondays and Thursdays *amiodarone  200mg  daily* Recheck in 1 week.

## 2023-10-20 NOTE — Telephone Encounter (Signed)
 Called and spoke with pt and [facility] nurse. Please refer to anticoagulation encounter

## 2023-10-27 DIAGNOSIS — K589 Irritable bowel syndrome without diarrhea: Secondary | ICD-10-CM | POA: Diagnosis not present

## 2023-10-27 DIAGNOSIS — I4891 Unspecified atrial fibrillation: Secondary | ICD-10-CM | POA: Diagnosis not present

## 2023-10-27 DIAGNOSIS — Z9181 History of falling: Secondary | ICD-10-CM | POA: Diagnosis not present

## 2023-10-27 DIAGNOSIS — E1122 Type 2 diabetes mellitus with diabetic chronic kidney disease: Secondary | ICD-10-CM | POA: Diagnosis not present

## 2023-10-27 DIAGNOSIS — I421 Obstructive hypertrophic cardiomyopathy: Secondary | ICD-10-CM | POA: Diagnosis not present

## 2023-10-27 DIAGNOSIS — M96661 Fracture of femur following insertion of orthopedic implant, joint prosthesis, or bone plate, right leg: Secondary | ICD-10-CM | POA: Diagnosis not present

## 2023-10-27 DIAGNOSIS — M1990 Unspecified osteoarthritis, unspecified site: Secondary | ICD-10-CM | POA: Diagnosis not present

## 2023-10-27 DIAGNOSIS — N182 Chronic kidney disease, stage 2 (mild): Secondary | ICD-10-CM | POA: Diagnosis not present

## 2023-10-27 DIAGNOSIS — Z87891 Personal history of nicotine dependence: Secondary | ICD-10-CM | POA: Diagnosis not present

## 2023-10-27 DIAGNOSIS — Z556 Problems related to health literacy: Secondary | ICD-10-CM | POA: Diagnosis not present

## 2023-10-27 DIAGNOSIS — Z86018 Personal history of other benign neoplasm: Secondary | ICD-10-CM | POA: Diagnosis not present

## 2023-10-27 DIAGNOSIS — G4700 Insomnia, unspecified: Secondary | ICD-10-CM | POA: Diagnosis not present

## 2023-10-27 DIAGNOSIS — Z87440 Personal history of urinary (tract) infections: Secondary | ICD-10-CM | POA: Diagnosis not present

## 2023-10-27 DIAGNOSIS — E785 Hyperlipidemia, unspecified: Secondary | ICD-10-CM | POA: Diagnosis not present

## 2023-10-27 DIAGNOSIS — Q249 Congenital malformation of heart, unspecified: Secondary | ICD-10-CM | POA: Diagnosis not present

## 2023-10-27 DIAGNOSIS — Z7901 Long term (current) use of anticoagulants: Secondary | ICD-10-CM | POA: Diagnosis not present

## 2023-10-27 DIAGNOSIS — I4719 Other supraventricular tachycardia: Secondary | ICD-10-CM | POA: Diagnosis not present

## 2023-10-27 DIAGNOSIS — Z7984 Long term (current) use of oral hypoglycemic drugs: Secondary | ICD-10-CM | POA: Diagnosis not present

## 2023-10-27 DIAGNOSIS — K219 Gastro-esophageal reflux disease without esophagitis: Secondary | ICD-10-CM | POA: Diagnosis not present

## 2023-10-27 DIAGNOSIS — R350 Frequency of micturition: Secondary | ICD-10-CM | POA: Diagnosis not present

## 2023-10-27 DIAGNOSIS — I129 Hypertensive chronic kidney disease with stage 1 through stage 4 chronic kidney disease, or unspecified chronic kidney disease: Secondary | ICD-10-CM | POA: Diagnosis not present

## 2023-10-27 DIAGNOSIS — I3489 Other nonrheumatic mitral valve disorders: Secondary | ICD-10-CM | POA: Diagnosis not present

## 2023-11-01 ENCOUNTER — Ambulatory Visit (INDEPENDENT_AMBULATORY_CARE_PROVIDER_SITE_OTHER): Payer: Self-pay | Admitting: *Deleted

## 2023-11-01 DIAGNOSIS — Z7984 Long term (current) use of oral hypoglycemic drugs: Secondary | ICD-10-CM | POA: Diagnosis not present

## 2023-11-01 DIAGNOSIS — I4719 Other supraventricular tachycardia: Secondary | ICD-10-CM | POA: Diagnosis not present

## 2023-11-01 DIAGNOSIS — I129 Hypertensive chronic kidney disease with stage 1 through stage 4 chronic kidney disease, or unspecified chronic kidney disease: Secondary | ICD-10-CM | POA: Diagnosis not present

## 2023-11-01 DIAGNOSIS — G4700 Insomnia, unspecified: Secondary | ICD-10-CM | POA: Diagnosis not present

## 2023-11-01 DIAGNOSIS — E785 Hyperlipidemia, unspecified: Secondary | ICD-10-CM | POA: Diagnosis not present

## 2023-11-01 DIAGNOSIS — I4891 Unspecified atrial fibrillation: Secondary | ICD-10-CM | POA: Diagnosis not present

## 2023-11-01 DIAGNOSIS — M96661 Fracture of femur following insertion of orthopedic implant, joint prosthesis, or bone plate, right leg: Secondary | ICD-10-CM | POA: Diagnosis not present

## 2023-11-01 DIAGNOSIS — M1990 Unspecified osteoarthritis, unspecified site: Secondary | ICD-10-CM | POA: Diagnosis not present

## 2023-11-01 DIAGNOSIS — Q249 Congenital malformation of heart, unspecified: Secondary | ICD-10-CM | POA: Diagnosis not present

## 2023-11-01 DIAGNOSIS — I480 Paroxysmal atrial fibrillation: Secondary | ICD-10-CM

## 2023-11-01 DIAGNOSIS — K219 Gastro-esophageal reflux disease without esophagitis: Secondary | ICD-10-CM | POA: Diagnosis not present

## 2023-11-01 DIAGNOSIS — Z9181 History of falling: Secondary | ICD-10-CM | POA: Diagnosis not present

## 2023-11-01 DIAGNOSIS — I421 Obstructive hypertrophic cardiomyopathy: Secondary | ICD-10-CM | POA: Diagnosis not present

## 2023-11-01 DIAGNOSIS — Z952 Presence of prosthetic heart valve: Secondary | ICD-10-CM | POA: Diagnosis not present

## 2023-11-01 DIAGNOSIS — Z86018 Personal history of other benign neoplasm: Secondary | ICD-10-CM | POA: Diagnosis not present

## 2023-11-01 DIAGNOSIS — K589 Irritable bowel syndrome without diarrhea: Secondary | ICD-10-CM | POA: Diagnosis not present

## 2023-11-01 DIAGNOSIS — Z87891 Personal history of nicotine dependence: Secondary | ICD-10-CM | POA: Diagnosis not present

## 2023-11-01 DIAGNOSIS — E1122 Type 2 diabetes mellitus with diabetic chronic kidney disease: Secondary | ICD-10-CM | POA: Diagnosis not present

## 2023-11-01 DIAGNOSIS — R350 Frequency of micturition: Secondary | ICD-10-CM | POA: Diagnosis not present

## 2023-11-01 DIAGNOSIS — N182 Chronic kidney disease, stage 2 (mild): Secondary | ICD-10-CM | POA: Diagnosis not present

## 2023-11-01 DIAGNOSIS — Z556 Problems related to health literacy: Secondary | ICD-10-CM | POA: Diagnosis not present

## 2023-11-01 DIAGNOSIS — Z87440 Personal history of urinary (tract) infections: Secondary | ICD-10-CM | POA: Diagnosis not present

## 2023-11-01 DIAGNOSIS — I3489 Other nonrheumatic mitral valve disorders: Secondary | ICD-10-CM | POA: Diagnosis not present

## 2023-11-01 DIAGNOSIS — Z7901 Long term (current) use of anticoagulants: Secondary | ICD-10-CM | POA: Diagnosis not present

## 2023-11-01 LAB — POCT INR: INR: 4.4 — AB (ref 2.0–3.0)

## 2023-11-01 NOTE — Patient Instructions (Signed)
 Called and spoke with [facility] RN Holley. Instructed to Hold warfarin tonight then resume 1 tablet daily except 1/2 tablet on Mondays and Thursdays *amiodarone  200mg  daily* Recheck in 1 week.  Pam [phone]

## 2023-11-04 DIAGNOSIS — E785 Hyperlipidemia, unspecified: Secondary | ICD-10-CM | POA: Diagnosis not present

## 2023-11-04 DIAGNOSIS — R350 Frequency of micturition: Secondary | ICD-10-CM | POA: Diagnosis not present

## 2023-11-04 DIAGNOSIS — Z7901 Long term (current) use of anticoagulants: Secondary | ICD-10-CM | POA: Diagnosis not present

## 2023-11-04 DIAGNOSIS — Z7984 Long term (current) use of oral hypoglycemic drugs: Secondary | ICD-10-CM | POA: Diagnosis not present

## 2023-11-04 DIAGNOSIS — G4700 Insomnia, unspecified: Secondary | ICD-10-CM | POA: Diagnosis not present

## 2023-11-04 DIAGNOSIS — Z556 Problems related to health literacy: Secondary | ICD-10-CM | POA: Diagnosis not present

## 2023-11-04 DIAGNOSIS — I129 Hypertensive chronic kidney disease with stage 1 through stage 4 chronic kidney disease, or unspecified chronic kidney disease: Secondary | ICD-10-CM | POA: Diagnosis not present

## 2023-11-04 DIAGNOSIS — M1990 Unspecified osteoarthritis, unspecified site: Secondary | ICD-10-CM | POA: Diagnosis not present

## 2023-11-04 DIAGNOSIS — K589 Irritable bowel syndrome without diarrhea: Secondary | ICD-10-CM | POA: Diagnosis not present

## 2023-11-04 DIAGNOSIS — Q249 Congenital malformation of heart, unspecified: Secondary | ICD-10-CM | POA: Diagnosis not present

## 2023-11-04 DIAGNOSIS — E1122 Type 2 diabetes mellitus with diabetic chronic kidney disease: Secondary | ICD-10-CM | POA: Diagnosis not present

## 2023-11-04 DIAGNOSIS — I4891 Unspecified atrial fibrillation: Secondary | ICD-10-CM | POA: Diagnosis not present

## 2023-11-04 DIAGNOSIS — I3489 Other nonrheumatic mitral valve disorders: Secondary | ICD-10-CM | POA: Diagnosis not present

## 2023-11-04 DIAGNOSIS — Z87440 Personal history of urinary (tract) infections: Secondary | ICD-10-CM | POA: Diagnosis not present

## 2023-11-04 DIAGNOSIS — I4719 Other supraventricular tachycardia: Secondary | ICD-10-CM | POA: Diagnosis not present

## 2023-11-04 DIAGNOSIS — M96661 Fracture of femur following insertion of orthopedic implant, joint prosthesis, or bone plate, right leg: Secondary | ICD-10-CM | POA: Diagnosis not present

## 2023-11-04 DIAGNOSIS — Z86018 Personal history of other benign neoplasm: Secondary | ICD-10-CM | POA: Diagnosis not present

## 2023-11-04 DIAGNOSIS — Z87891 Personal history of nicotine dependence: Secondary | ICD-10-CM | POA: Diagnosis not present

## 2023-11-04 DIAGNOSIS — K219 Gastro-esophageal reflux disease without esophagitis: Secondary | ICD-10-CM | POA: Diagnosis not present

## 2023-11-04 DIAGNOSIS — I421 Obstructive hypertrophic cardiomyopathy: Secondary | ICD-10-CM | POA: Diagnosis not present

## 2023-11-04 DIAGNOSIS — N182 Chronic kidney disease, stage 2 (mild): Secondary | ICD-10-CM | POA: Diagnosis not present

## 2023-11-04 DIAGNOSIS — Z9181 History of falling: Secondary | ICD-10-CM | POA: Diagnosis not present

## 2023-11-07 ENCOUNTER — Ambulatory Visit (INDEPENDENT_AMBULATORY_CARE_PROVIDER_SITE_OTHER): Admitting: *Deleted

## 2023-11-07 DIAGNOSIS — Z9181 History of falling: Secondary | ICD-10-CM | POA: Diagnosis not present

## 2023-11-07 DIAGNOSIS — R350 Frequency of micturition: Secondary | ICD-10-CM | POA: Diagnosis not present

## 2023-11-07 DIAGNOSIS — Z5181 Encounter for therapeutic drug level monitoring: Secondary | ICD-10-CM | POA: Diagnosis not present

## 2023-11-07 DIAGNOSIS — Q249 Congenital malformation of heart, unspecified: Secondary | ICD-10-CM | POA: Diagnosis not present

## 2023-11-07 DIAGNOSIS — Z87891 Personal history of nicotine dependence: Secondary | ICD-10-CM | POA: Diagnosis not present

## 2023-11-07 DIAGNOSIS — M96661 Fracture of femur following insertion of orthopedic implant, joint prosthesis, or bone plate, right leg: Secondary | ICD-10-CM | POA: Diagnosis not present

## 2023-11-07 DIAGNOSIS — K589 Irritable bowel syndrome without diarrhea: Secondary | ICD-10-CM | POA: Diagnosis not present

## 2023-11-07 DIAGNOSIS — I421 Obstructive hypertrophic cardiomyopathy: Secondary | ICD-10-CM | POA: Diagnosis not present

## 2023-11-07 DIAGNOSIS — N182 Chronic kidney disease, stage 2 (mild): Secondary | ICD-10-CM | POA: Diagnosis not present

## 2023-11-07 DIAGNOSIS — K219 Gastro-esophageal reflux disease without esophagitis: Secondary | ICD-10-CM | POA: Diagnosis not present

## 2023-11-07 DIAGNOSIS — I3489 Other nonrheumatic mitral valve disorders: Secondary | ICD-10-CM | POA: Diagnosis not present

## 2023-11-07 DIAGNOSIS — Z86018 Personal history of other benign neoplasm: Secondary | ICD-10-CM | POA: Diagnosis not present

## 2023-11-07 DIAGNOSIS — M1990 Unspecified osteoarthritis, unspecified site: Secondary | ICD-10-CM | POA: Diagnosis not present

## 2023-11-07 DIAGNOSIS — G4700 Insomnia, unspecified: Secondary | ICD-10-CM | POA: Diagnosis not present

## 2023-11-07 DIAGNOSIS — Z7984 Long term (current) use of oral hypoglycemic drugs: Secondary | ICD-10-CM | POA: Diagnosis not present

## 2023-11-07 DIAGNOSIS — I4719 Other supraventricular tachycardia: Secondary | ICD-10-CM | POA: Diagnosis not present

## 2023-11-07 DIAGNOSIS — E1122 Type 2 diabetes mellitus with diabetic chronic kidney disease: Secondary | ICD-10-CM | POA: Diagnosis not present

## 2023-11-07 DIAGNOSIS — I4891 Unspecified atrial fibrillation: Secondary | ICD-10-CM | POA: Diagnosis not present

## 2023-11-07 DIAGNOSIS — Z87440 Personal history of urinary (tract) infections: Secondary | ICD-10-CM | POA: Diagnosis not present

## 2023-11-07 DIAGNOSIS — Z7901 Long term (current) use of anticoagulants: Secondary | ICD-10-CM | POA: Diagnosis not present

## 2023-11-07 DIAGNOSIS — Z556 Problems related to health literacy: Secondary | ICD-10-CM | POA: Diagnosis not present

## 2023-11-07 DIAGNOSIS — I129 Hypertensive chronic kidney disease with stage 1 through stage 4 chronic kidney disease, or unspecified chronic kidney disease: Secondary | ICD-10-CM | POA: Diagnosis not present

## 2023-11-07 DIAGNOSIS — E785 Hyperlipidemia, unspecified: Secondary | ICD-10-CM | POA: Diagnosis not present

## 2023-11-07 LAB — POCT INR: INR: 3.1 — AB (ref 2.0–3.0)

## 2023-11-07 NOTE — Patient Instructions (Signed)
 Spoke with [employer]. Instructed her to have pt continue warfarin 1 tablet daily except 1/2 tablet on Mondays and Thursdays *amiodarone  200mg  daily* Recheck in 1 week.  Pam [phone]

## 2023-11-08 DIAGNOSIS — I3489 Other nonrheumatic mitral valve disorders: Secondary | ICD-10-CM | POA: Diagnosis not present

## 2023-11-08 DIAGNOSIS — N182 Chronic kidney disease, stage 2 (mild): Secondary | ICD-10-CM | POA: Diagnosis not present

## 2023-11-08 DIAGNOSIS — G4700 Insomnia, unspecified: Secondary | ICD-10-CM | POA: Diagnosis not present

## 2023-11-08 DIAGNOSIS — E1122 Type 2 diabetes mellitus with diabetic chronic kidney disease: Secondary | ICD-10-CM | POA: Diagnosis not present

## 2023-11-08 DIAGNOSIS — Z87440 Personal history of urinary (tract) infections: Secondary | ICD-10-CM | POA: Diagnosis not present

## 2023-11-08 DIAGNOSIS — Z556 Problems related to health literacy: Secondary | ICD-10-CM | POA: Diagnosis not present

## 2023-11-08 DIAGNOSIS — Z7984 Long term (current) use of oral hypoglycemic drugs: Secondary | ICD-10-CM | POA: Diagnosis not present

## 2023-11-08 DIAGNOSIS — Q249 Congenital malformation of heart, unspecified: Secondary | ICD-10-CM | POA: Diagnosis not present

## 2023-11-08 DIAGNOSIS — K589 Irritable bowel syndrome without diarrhea: Secondary | ICD-10-CM | POA: Diagnosis not present

## 2023-11-08 DIAGNOSIS — I4719 Other supraventricular tachycardia: Secondary | ICD-10-CM | POA: Diagnosis not present

## 2023-11-08 DIAGNOSIS — K219 Gastro-esophageal reflux disease without esophagitis: Secondary | ICD-10-CM | POA: Diagnosis not present

## 2023-11-08 DIAGNOSIS — I129 Hypertensive chronic kidney disease with stage 1 through stage 4 chronic kidney disease, or unspecified chronic kidney disease: Secondary | ICD-10-CM | POA: Diagnosis not present

## 2023-11-08 DIAGNOSIS — M1990 Unspecified osteoarthritis, unspecified site: Secondary | ICD-10-CM | POA: Diagnosis not present

## 2023-11-08 DIAGNOSIS — I4891 Unspecified atrial fibrillation: Secondary | ICD-10-CM | POA: Diagnosis not present

## 2023-11-08 DIAGNOSIS — Z9181 History of falling: Secondary | ICD-10-CM | POA: Diagnosis not present

## 2023-11-08 DIAGNOSIS — E785 Hyperlipidemia, unspecified: Secondary | ICD-10-CM | POA: Diagnosis not present

## 2023-11-08 DIAGNOSIS — Z7901 Long term (current) use of anticoagulants: Secondary | ICD-10-CM | POA: Diagnosis not present

## 2023-11-08 DIAGNOSIS — Z86018 Personal history of other benign neoplasm: Secondary | ICD-10-CM | POA: Diagnosis not present

## 2023-11-08 DIAGNOSIS — M96661 Fracture of femur following insertion of orthopedic implant, joint prosthesis, or bone plate, right leg: Secondary | ICD-10-CM | POA: Diagnosis not present

## 2023-11-08 DIAGNOSIS — Z87891 Personal history of nicotine dependence: Secondary | ICD-10-CM | POA: Diagnosis not present

## 2023-11-08 DIAGNOSIS — I421 Obstructive hypertrophic cardiomyopathy: Secondary | ICD-10-CM | POA: Diagnosis not present

## 2023-11-08 DIAGNOSIS — R350 Frequency of micturition: Secondary | ICD-10-CM | POA: Diagnosis not present

## 2023-11-08 NOTE — Progress Notes (Signed)
Please see anticoagulation encounter.

## 2023-11-11 ENCOUNTER — Ambulatory Visit: Payer: Medicare Other

## 2023-11-11 DIAGNOSIS — I442 Atrioventricular block, complete: Secondary | ICD-10-CM

## 2023-11-14 ENCOUNTER — Encounter: Payer: Self-pay | Admitting: Cardiovascular Disease

## 2023-11-14 ENCOUNTER — Ambulatory Visit (INDEPENDENT_AMBULATORY_CARE_PROVIDER_SITE_OTHER): Payer: Self-pay | Admitting: *Deleted

## 2023-11-14 DIAGNOSIS — R350 Frequency of micturition: Secondary | ICD-10-CM | POA: Diagnosis not present

## 2023-11-14 DIAGNOSIS — Z952 Presence of prosthetic heart valve: Secondary | ICD-10-CM

## 2023-11-14 DIAGNOSIS — E1122 Type 2 diabetes mellitus with diabetic chronic kidney disease: Secondary | ICD-10-CM | POA: Diagnosis not present

## 2023-11-14 DIAGNOSIS — Z5181 Encounter for therapeutic drug level monitoring: Secondary | ICD-10-CM | POA: Diagnosis not present

## 2023-11-14 DIAGNOSIS — N182 Chronic kidney disease, stage 2 (mild): Secondary | ICD-10-CM | POA: Diagnosis not present

## 2023-11-14 DIAGNOSIS — I350 Nonrheumatic aortic (valve) stenosis: Secondary | ICD-10-CM

## 2023-11-14 DIAGNOSIS — I4891 Unspecified atrial fibrillation: Secondary | ICD-10-CM | POA: Diagnosis not present

## 2023-11-14 DIAGNOSIS — Z86018 Personal history of other benign neoplasm: Secondary | ICD-10-CM | POA: Diagnosis not present

## 2023-11-14 DIAGNOSIS — K589 Irritable bowel syndrome without diarrhea: Secondary | ICD-10-CM | POA: Diagnosis not present

## 2023-11-14 DIAGNOSIS — I129 Hypertensive chronic kidney disease with stage 1 through stage 4 chronic kidney disease, or unspecified chronic kidney disease: Secondary | ICD-10-CM | POA: Diagnosis not present

## 2023-11-14 DIAGNOSIS — M1990 Unspecified osteoarthritis, unspecified site: Secondary | ICD-10-CM | POA: Diagnosis not present

## 2023-11-14 DIAGNOSIS — I3489 Other nonrheumatic mitral valve disorders: Secondary | ICD-10-CM | POA: Diagnosis not present

## 2023-11-14 DIAGNOSIS — Z7901 Long term (current) use of anticoagulants: Secondary | ICD-10-CM | POA: Diagnosis not present

## 2023-11-14 DIAGNOSIS — I4719 Other supraventricular tachycardia: Secondary | ICD-10-CM | POA: Diagnosis not present

## 2023-11-14 DIAGNOSIS — E785 Hyperlipidemia, unspecified: Secondary | ICD-10-CM | POA: Diagnosis not present

## 2023-11-14 DIAGNOSIS — Z556 Problems related to health literacy: Secondary | ICD-10-CM | POA: Diagnosis not present

## 2023-11-14 DIAGNOSIS — Z87440 Personal history of urinary (tract) infections: Secondary | ICD-10-CM | POA: Diagnosis not present

## 2023-11-14 DIAGNOSIS — K219 Gastro-esophageal reflux disease without esophagitis: Secondary | ICD-10-CM | POA: Diagnosis not present

## 2023-11-14 DIAGNOSIS — Q249 Congenital malformation of heart, unspecified: Secondary | ICD-10-CM | POA: Diagnosis not present

## 2023-11-14 DIAGNOSIS — G4700 Insomnia, unspecified: Secondary | ICD-10-CM | POA: Diagnosis not present

## 2023-11-14 DIAGNOSIS — Z9181 History of falling: Secondary | ICD-10-CM | POA: Diagnosis not present

## 2023-11-14 DIAGNOSIS — I421 Obstructive hypertrophic cardiomyopathy: Secondary | ICD-10-CM | POA: Diagnosis not present

## 2023-11-14 DIAGNOSIS — Z87891 Personal history of nicotine dependence: Secondary | ICD-10-CM | POA: Diagnosis not present

## 2023-11-14 DIAGNOSIS — M96661 Fracture of femur following insertion of orthopedic implant, joint prosthesis, or bone plate, right leg: Secondary | ICD-10-CM | POA: Diagnosis not present

## 2023-11-14 DIAGNOSIS — Z7984 Long term (current) use of oral hypoglycemic drugs: Secondary | ICD-10-CM | POA: Diagnosis not present

## 2023-11-14 LAB — CUP PACEART REMOTE DEVICE CHECK
Battery Remaining Longevity: 150 mo
Battery Remaining Percentage: 100 %
Brady Statistic RA Percent Paced: 92 %
Brady Statistic RV Percent Paced: 100 %
Date Time Interrogation Session: 20250718044100
Implantable Lead Connection Status: 753985
Implantable Lead Connection Status: 753985
Implantable Lead Implant Date: 20240702
Implantable Lead Implant Date: 20240702
Implantable Lead Location: 753859
Implantable Lead Location: 753860
Implantable Lead Model: 7841
Implantable Lead Model: 7842
Implantable Lead Serial Number: 1337013
Implantable Lead Serial Number: 1458949
Implantable Pulse Generator Implant Date: 20240702
Lead Channel Impedance Value: 569 Ohm
Lead Channel Impedance Value: 586 Ohm
Lead Channel Setting Pacing Amplitude: 2 V
Lead Channel Setting Pacing Amplitude: 2.5 V
Lead Channel Setting Pacing Pulse Width: 0.4 ms
Lead Channel Setting Sensing Sensitivity: 2.5 mV
Pulse Gen Serial Number: 680078
Zone Setting Status: 755011

## 2023-11-14 LAB — POCT INR: INR: 1.6 — AB (ref 2.0–3.0)

## 2023-11-14 NOTE — Patient Instructions (Signed)
 Spoke with [employer]. Instructed her to have pt take warfarin 1 1/2 tablets tonight and tomorrow night then resume 1 tablet daily except 1/2 tablet on Mondays and Thursdays *amiodarone  200mg  daily* Recheck in 1 week.

## 2023-11-14 NOTE — Telephone Encounter (Signed)
 error

## 2023-11-16 ENCOUNTER — Ambulatory Visit: Payer: Self-pay | Admitting: Internal Medicine

## 2023-11-21 ENCOUNTER — Telehealth: Payer: Self-pay | Admitting: Cardiovascular Disease

## 2023-11-21 DIAGNOSIS — Q249 Congenital malformation of heart, unspecified: Secondary | ICD-10-CM | POA: Diagnosis not present

## 2023-11-21 DIAGNOSIS — Z7984 Long term (current) use of oral hypoglycemic drugs: Secondary | ICD-10-CM | POA: Diagnosis not present

## 2023-11-21 DIAGNOSIS — I3489 Other nonrheumatic mitral valve disorders: Secondary | ICD-10-CM | POA: Diagnosis not present

## 2023-11-21 DIAGNOSIS — Z86018 Personal history of other benign neoplasm: Secondary | ICD-10-CM | POA: Diagnosis not present

## 2023-11-21 DIAGNOSIS — M1990 Unspecified osteoarthritis, unspecified site: Secondary | ICD-10-CM | POA: Diagnosis not present

## 2023-11-21 DIAGNOSIS — R350 Frequency of micturition: Secondary | ICD-10-CM | POA: Diagnosis not present

## 2023-11-21 DIAGNOSIS — Z9181 History of falling: Secondary | ICD-10-CM | POA: Diagnosis not present

## 2023-11-21 DIAGNOSIS — Z7901 Long term (current) use of anticoagulants: Secondary | ICD-10-CM | POA: Diagnosis not present

## 2023-11-21 DIAGNOSIS — K589 Irritable bowel syndrome without diarrhea: Secondary | ICD-10-CM | POA: Diagnosis not present

## 2023-11-21 DIAGNOSIS — I4891 Unspecified atrial fibrillation: Secondary | ICD-10-CM | POA: Diagnosis not present

## 2023-11-21 DIAGNOSIS — Z87440 Personal history of urinary (tract) infections: Secondary | ICD-10-CM | POA: Diagnosis not present

## 2023-11-21 DIAGNOSIS — E785 Hyperlipidemia, unspecified: Secondary | ICD-10-CM | POA: Diagnosis not present

## 2023-11-21 DIAGNOSIS — I129 Hypertensive chronic kidney disease with stage 1 through stage 4 chronic kidney disease, or unspecified chronic kidney disease: Secondary | ICD-10-CM | POA: Diagnosis not present

## 2023-11-21 DIAGNOSIS — G4700 Insomnia, unspecified: Secondary | ICD-10-CM | POA: Diagnosis not present

## 2023-11-21 DIAGNOSIS — M96661 Fracture of femur following insertion of orthopedic implant, joint prosthesis, or bone plate, right leg: Secondary | ICD-10-CM | POA: Diagnosis not present

## 2023-11-21 DIAGNOSIS — I4719 Other supraventricular tachycardia: Secondary | ICD-10-CM | POA: Diagnosis not present

## 2023-11-21 DIAGNOSIS — E1122 Type 2 diabetes mellitus with diabetic chronic kidney disease: Secondary | ICD-10-CM | POA: Diagnosis not present

## 2023-11-21 DIAGNOSIS — I421 Obstructive hypertrophic cardiomyopathy: Secondary | ICD-10-CM | POA: Diagnosis not present

## 2023-11-21 DIAGNOSIS — Z87891 Personal history of nicotine dependence: Secondary | ICD-10-CM | POA: Diagnosis not present

## 2023-11-21 DIAGNOSIS — N182 Chronic kidney disease, stage 2 (mild): Secondary | ICD-10-CM | POA: Diagnosis not present

## 2023-11-21 DIAGNOSIS — Z556 Problems related to health literacy: Secondary | ICD-10-CM | POA: Diagnosis not present

## 2023-11-21 DIAGNOSIS — K219 Gastro-esophageal reflux disease without esophagitis: Secondary | ICD-10-CM | POA: Diagnosis not present

## 2023-11-21 LAB — POCT INR: INR: 3.1 — AB (ref 2.0–3.0)

## 2023-11-21 NOTE — Telephone Encounter (Signed)
 Caller (Josie) wants a call back to discuss patient's INR today was 3.1.

## 2023-11-23 ENCOUNTER — Ambulatory Visit (INDEPENDENT_AMBULATORY_CARE_PROVIDER_SITE_OTHER): Admitting: *Deleted

## 2023-11-23 DIAGNOSIS — Z5181 Encounter for therapeutic drug level monitoring: Secondary | ICD-10-CM | POA: Diagnosis not present

## 2023-11-23 DIAGNOSIS — I4891 Unspecified atrial fibrillation: Secondary | ICD-10-CM

## 2023-11-23 DIAGNOSIS — Z952 Presence of prosthetic heart valve: Secondary | ICD-10-CM

## 2023-11-23 NOTE — Patient Instructions (Signed)
 Spoke with [employer] RN Josie. Instructed her to have pt continue warfarin 1 tablet daily except 1/2 tablet on Mondays and Thursdays *amiodarone  200mg  daily* Recheck in 1 week.

## 2023-11-23 NOTE — Telephone Encounter (Addendum)
 INR results noted and documented.  See coumadin  note.  Waiting to call Josie back.  Call Josie back.  No answer.  Left message on voice mail for pt to continue current dose of warfarin and recheck INR 11/28/23.  Pt called and informed as well.

## 2023-11-23 NOTE — Progress Notes (Signed)
 INR-3.1; Please see anticoagulation encounter.

## 2023-11-28 ENCOUNTER — Other Ambulatory Visit (HOSPITAL_COMMUNITY): Payer: Self-pay | Admitting: Student

## 2023-11-28 DIAGNOSIS — M25551 Pain in right hip: Secondary | ICD-10-CM

## 2023-11-28 DIAGNOSIS — S72141D Displaced intertrochanteric fracture of right femur, subsequent encounter for closed fracture with routine healing: Secondary | ICD-10-CM | POA: Diagnosis not present

## 2023-11-29 DIAGNOSIS — K589 Irritable bowel syndrome without diarrhea: Secondary | ICD-10-CM | POA: Diagnosis not present

## 2023-11-29 DIAGNOSIS — E1122 Type 2 diabetes mellitus with diabetic chronic kidney disease: Secondary | ICD-10-CM | POA: Diagnosis not present

## 2023-11-29 DIAGNOSIS — G4700 Insomnia, unspecified: Secondary | ICD-10-CM | POA: Diagnosis not present

## 2023-11-29 DIAGNOSIS — I3489 Other nonrheumatic mitral valve disorders: Secondary | ICD-10-CM | POA: Diagnosis not present

## 2023-11-29 DIAGNOSIS — Z7901 Long term (current) use of anticoagulants: Secondary | ICD-10-CM | POA: Diagnosis not present

## 2023-11-29 DIAGNOSIS — Q249 Congenital malformation of heart, unspecified: Secondary | ICD-10-CM | POA: Diagnosis not present

## 2023-11-29 DIAGNOSIS — Z9181 History of falling: Secondary | ICD-10-CM | POA: Diagnosis not present

## 2023-11-29 DIAGNOSIS — Z7984 Long term (current) use of oral hypoglycemic drugs: Secondary | ICD-10-CM | POA: Diagnosis not present

## 2023-11-29 DIAGNOSIS — N182 Chronic kidney disease, stage 2 (mild): Secondary | ICD-10-CM | POA: Diagnosis not present

## 2023-11-29 DIAGNOSIS — M1990 Unspecified osteoarthritis, unspecified site: Secondary | ICD-10-CM | POA: Diagnosis not present

## 2023-11-29 DIAGNOSIS — E785 Hyperlipidemia, unspecified: Secondary | ICD-10-CM | POA: Diagnosis not present

## 2023-11-29 DIAGNOSIS — Z556 Problems related to health literacy: Secondary | ICD-10-CM | POA: Diagnosis not present

## 2023-11-29 DIAGNOSIS — I4719 Other supraventricular tachycardia: Secondary | ICD-10-CM | POA: Diagnosis not present

## 2023-11-29 DIAGNOSIS — Z87440 Personal history of urinary (tract) infections: Secondary | ICD-10-CM | POA: Diagnosis not present

## 2023-11-29 DIAGNOSIS — I421 Obstructive hypertrophic cardiomyopathy: Secondary | ICD-10-CM | POA: Diagnosis not present

## 2023-11-29 DIAGNOSIS — M96661 Fracture of femur following insertion of orthopedic implant, joint prosthesis, or bone plate, right leg: Secondary | ICD-10-CM | POA: Diagnosis not present

## 2023-11-29 DIAGNOSIS — Z86018 Personal history of other benign neoplasm: Secondary | ICD-10-CM | POA: Diagnosis not present

## 2023-11-29 DIAGNOSIS — Z87891 Personal history of nicotine dependence: Secondary | ICD-10-CM | POA: Diagnosis not present

## 2023-11-29 DIAGNOSIS — K219 Gastro-esophageal reflux disease without esophagitis: Secondary | ICD-10-CM | POA: Diagnosis not present

## 2023-11-29 DIAGNOSIS — I4891 Unspecified atrial fibrillation: Secondary | ICD-10-CM | POA: Diagnosis not present

## 2023-11-29 DIAGNOSIS — R350 Frequency of micturition: Secondary | ICD-10-CM | POA: Diagnosis not present

## 2023-11-29 DIAGNOSIS — I129 Hypertensive chronic kidney disease with stage 1 through stage 4 chronic kidney disease, or unspecified chronic kidney disease: Secondary | ICD-10-CM | POA: Diagnosis not present

## 2023-11-30 ENCOUNTER — Ambulatory Visit (INDEPENDENT_AMBULATORY_CARE_PROVIDER_SITE_OTHER): Admitting: *Deleted

## 2023-11-30 ENCOUNTER — Telehealth: Payer: Self-pay | Admitting: Cardiovascular Disease

## 2023-11-30 DIAGNOSIS — Z952 Presence of prosthetic heart valve: Secondary | ICD-10-CM | POA: Diagnosis not present

## 2023-11-30 DIAGNOSIS — I4891 Unspecified atrial fibrillation: Secondary | ICD-10-CM

## 2023-11-30 LAB — POCT INR: INR: 3.3 — AB (ref 2.0–3.0)

## 2023-11-30 NOTE — Telephone Encounter (Signed)
 Pt's [facility] nurse calling to report pt's INR, 3.3. Please advise

## 2023-11-30 NOTE — Telephone Encounter (Addendum)
 INR result noted and Channing [facility] Nurse notified to continue current dose of warfarin and recheck in 1 week.  See coumadin  note.

## 2023-11-30 NOTE — Patient Instructions (Signed)
 Spoke with [facility] RN Channing. Instructed her to have pt continue warfarin 1 tablet daily except 1/2 tablet on Mondays and Thursdays *amiodarone  200mg  daily* Recheck in 1 week.

## 2023-11-30 NOTE — Progress Notes (Signed)
 INR 3.3  Please see anticoagulation encounter

## 2023-12-05 ENCOUNTER — Other Ambulatory Visit: Payer: Self-pay | Admitting: Internal Medicine

## 2023-12-05 NOTE — Telephone Encounter (Signed)
 Refill request for warfarin:  Last INR was 3.3 on 11/30/23 Next INR due 12/07/23 LOV was 02/08/23  Refill approved.

## 2023-12-06 ENCOUNTER — Ambulatory Visit (INDEPENDENT_AMBULATORY_CARE_PROVIDER_SITE_OTHER): Admitting: *Deleted

## 2023-12-06 ENCOUNTER — Telehealth: Payer: Self-pay | Admitting: Cardiovascular Disease

## 2023-12-06 DIAGNOSIS — K219 Gastro-esophageal reflux disease without esophagitis: Secondary | ICD-10-CM | POA: Diagnosis not present

## 2023-12-06 DIAGNOSIS — Z7901 Long term (current) use of anticoagulants: Secondary | ICD-10-CM | POA: Diagnosis not present

## 2023-12-06 DIAGNOSIS — G4700 Insomnia, unspecified: Secondary | ICD-10-CM | POA: Diagnosis not present

## 2023-12-06 DIAGNOSIS — Z952 Presence of prosthetic heart valve: Secondary | ICD-10-CM | POA: Diagnosis not present

## 2023-12-06 DIAGNOSIS — Q249 Congenital malformation of heart, unspecified: Secondary | ICD-10-CM | POA: Diagnosis not present

## 2023-12-06 DIAGNOSIS — N182 Chronic kidney disease, stage 2 (mild): Secondary | ICD-10-CM | POA: Diagnosis not present

## 2023-12-06 DIAGNOSIS — Z5181 Encounter for therapeutic drug level monitoring: Secondary | ICD-10-CM | POA: Diagnosis not present

## 2023-12-06 DIAGNOSIS — I4719 Other supraventricular tachycardia: Secondary | ICD-10-CM | POA: Diagnosis not present

## 2023-12-06 DIAGNOSIS — Z87891 Personal history of nicotine dependence: Secondary | ICD-10-CM | POA: Diagnosis not present

## 2023-12-06 DIAGNOSIS — Z7984 Long term (current) use of oral hypoglycemic drugs: Secondary | ICD-10-CM | POA: Diagnosis not present

## 2023-12-06 DIAGNOSIS — E785 Hyperlipidemia, unspecified: Secondary | ICD-10-CM | POA: Diagnosis not present

## 2023-12-06 DIAGNOSIS — Z86018 Personal history of other benign neoplasm: Secondary | ICD-10-CM | POA: Diagnosis not present

## 2023-12-06 DIAGNOSIS — E1122 Type 2 diabetes mellitus with diabetic chronic kidney disease: Secondary | ICD-10-CM | POA: Diagnosis not present

## 2023-12-06 DIAGNOSIS — R350 Frequency of micturition: Secondary | ICD-10-CM | POA: Diagnosis not present

## 2023-12-06 DIAGNOSIS — I4891 Unspecified atrial fibrillation: Secondary | ICD-10-CM | POA: Diagnosis not present

## 2023-12-06 DIAGNOSIS — K589 Irritable bowel syndrome without diarrhea: Secondary | ICD-10-CM | POA: Diagnosis not present

## 2023-12-06 DIAGNOSIS — M96661 Fracture of femur following insertion of orthopedic implant, joint prosthesis, or bone plate, right leg: Secondary | ICD-10-CM | POA: Diagnosis not present

## 2023-12-06 DIAGNOSIS — Z556 Problems related to health literacy: Secondary | ICD-10-CM | POA: Diagnosis not present

## 2023-12-06 DIAGNOSIS — Z87440 Personal history of urinary (tract) infections: Secondary | ICD-10-CM | POA: Diagnosis not present

## 2023-12-06 DIAGNOSIS — I3489 Other nonrheumatic mitral valve disorders: Secondary | ICD-10-CM | POA: Diagnosis not present

## 2023-12-06 DIAGNOSIS — Z9181 History of falling: Secondary | ICD-10-CM | POA: Diagnosis not present

## 2023-12-06 DIAGNOSIS — I129 Hypertensive chronic kidney disease with stage 1 through stage 4 chronic kidney disease, or unspecified chronic kidney disease: Secondary | ICD-10-CM | POA: Diagnosis not present

## 2023-12-06 DIAGNOSIS — I421 Obstructive hypertrophic cardiomyopathy: Secondary | ICD-10-CM | POA: Diagnosis not present

## 2023-12-06 DIAGNOSIS — M1990 Unspecified osteoarthritis, unspecified site: Secondary | ICD-10-CM | POA: Diagnosis not present

## 2023-12-06 LAB — POCT INR: INR: 4 — AB (ref 2.0–3.0)

## 2023-12-06 NOTE — Telephone Encounter (Signed)
 Called and spoke with Channing RN [facility].  Instructed to have pt hold warfarin tonight then resume current dose tomorrow.  Home Health discharged pt today.  Made appt to recheck INR in 2 weeks in the [facility]. Channing will let pt know.

## 2023-12-06 NOTE — Telephone Encounter (Signed)
 Channing, RN, with [facility] called in to report INR of 4.0.

## 2023-12-06 NOTE — Patient Instructions (Addendum)
 Spoke with [facility] RN Channing. Instructed her to have pt hold warfarin tonight then resume 1 tablet daily except 1/2 tablet on Mondays and Thursdays *amiodarone  200mg  daily* Recheck in 2 weeks in the office.  HH discharged pt today.

## 2023-12-06 NOTE — Progress Notes (Signed)
 INR 4.0; Please see anticoagulation encounter

## 2023-12-07 ENCOUNTER — Ambulatory Visit (HOSPITAL_COMMUNITY)
Admission: RE | Admit: 2023-12-07 | Discharge: 2023-12-07 | Disposition: A | Discharge status: Home / Self Care | Source: Ambulatory Visit | Attending: Student | Admitting: Student

## 2023-12-07 DIAGNOSIS — M1611 Unilateral primary osteoarthritis, right hip: Secondary | ICD-10-CM | POA: Diagnosis not present

## 2023-12-07 DIAGNOSIS — S72121D Displaced fracture of lesser trochanter of right femur, subsequent encounter for closed fracture with routine healing: Secondary | ICD-10-CM | POA: Diagnosis not present

## 2023-12-07 DIAGNOSIS — S72141D Displaced intertrochanteric fracture of right femur, subsequent encounter for closed fracture with routine healing: Secondary | ICD-10-CM | POA: Diagnosis not present

## 2023-12-07 DIAGNOSIS — M25551 Pain in right hip: Secondary | ICD-10-CM | POA: Diagnosis not present

## 2023-12-07 DIAGNOSIS — K5730 Diverticulosis of large intestine without perforation or abscess without bleeding: Secondary | ICD-10-CM | POA: Diagnosis not present

## 2023-12-09 DIAGNOSIS — K219 Gastro-esophageal reflux disease without esophagitis: Secondary | ICD-10-CM | POA: Diagnosis not present

## 2023-12-09 DIAGNOSIS — Z87891 Personal history of nicotine dependence: Secondary | ICD-10-CM | POA: Diagnosis not present

## 2023-12-09 DIAGNOSIS — R350 Frequency of micturition: Secondary | ICD-10-CM | POA: Diagnosis not present

## 2023-12-09 DIAGNOSIS — I421 Obstructive hypertrophic cardiomyopathy: Secondary | ICD-10-CM | POA: Diagnosis not present

## 2023-12-09 DIAGNOSIS — Z7984 Long term (current) use of oral hypoglycemic drugs: Secondary | ICD-10-CM | POA: Diagnosis not present

## 2023-12-09 DIAGNOSIS — I4719 Other supraventricular tachycardia: Secondary | ICD-10-CM | POA: Diagnosis not present

## 2023-12-09 DIAGNOSIS — Z556 Problems related to health literacy: Secondary | ICD-10-CM | POA: Diagnosis not present

## 2023-12-09 DIAGNOSIS — Z9181 History of falling: Secondary | ICD-10-CM | POA: Diagnosis not present

## 2023-12-09 DIAGNOSIS — Q249 Congenital malformation of heart, unspecified: Secondary | ICD-10-CM | POA: Diagnosis not present

## 2023-12-09 DIAGNOSIS — N182 Chronic kidney disease, stage 2 (mild): Secondary | ICD-10-CM | POA: Diagnosis not present

## 2023-12-09 DIAGNOSIS — G4700 Insomnia, unspecified: Secondary | ICD-10-CM | POA: Diagnosis not present

## 2023-12-09 DIAGNOSIS — Z7901 Long term (current) use of anticoagulants: Secondary | ICD-10-CM | POA: Diagnosis not present

## 2023-12-09 DIAGNOSIS — I4891 Unspecified atrial fibrillation: Secondary | ICD-10-CM | POA: Diagnosis not present

## 2023-12-09 DIAGNOSIS — I3489 Other nonrheumatic mitral valve disorders: Secondary | ICD-10-CM | POA: Diagnosis not present

## 2023-12-09 DIAGNOSIS — I129 Hypertensive chronic kidney disease with stage 1 through stage 4 chronic kidney disease, or unspecified chronic kidney disease: Secondary | ICD-10-CM | POA: Diagnosis not present

## 2023-12-09 DIAGNOSIS — Z87440 Personal history of urinary (tract) infections: Secondary | ICD-10-CM | POA: Diagnosis not present

## 2023-12-09 DIAGNOSIS — K589 Irritable bowel syndrome without diarrhea: Secondary | ICD-10-CM | POA: Diagnosis not present

## 2023-12-09 DIAGNOSIS — E785 Hyperlipidemia, unspecified: Secondary | ICD-10-CM | POA: Diagnosis not present

## 2023-12-09 DIAGNOSIS — Z86018 Personal history of other benign neoplasm: Secondary | ICD-10-CM | POA: Diagnosis not present

## 2023-12-09 DIAGNOSIS — M96661 Fracture of femur following insertion of orthopedic implant, joint prosthesis, or bone plate, right leg: Secondary | ICD-10-CM | POA: Diagnosis not present

## 2023-12-09 DIAGNOSIS — E1122 Type 2 diabetes mellitus with diabetic chronic kidney disease: Secondary | ICD-10-CM | POA: Diagnosis not present

## 2023-12-09 DIAGNOSIS — M1990 Unspecified osteoarthritis, unspecified site: Secondary | ICD-10-CM | POA: Diagnosis not present

## 2023-12-20 ENCOUNTER — Ambulatory Visit: Attending: Internal Medicine | Admitting: *Deleted

## 2023-12-20 DIAGNOSIS — I4891 Unspecified atrial fibrillation: Secondary | ICD-10-CM

## 2023-12-20 DIAGNOSIS — Z5181 Encounter for therapeutic drug level monitoring: Secondary | ICD-10-CM

## 2023-12-20 LAB — POCT INR: INR: 3.2 — AB (ref 2.0–3.0)

## 2023-12-20 NOTE — Progress Notes (Signed)
 INR 3.2 Please see anticoagulation encounter

## 2023-12-20 NOTE — Patient Instructions (Signed)
 Continue warfarin 1 tablet daily except 1/2 tablet on Mondays and Thursdays *amiodarone  200mg  daily* Recheck in 4 weeks in the office.

## 2023-12-29 ENCOUNTER — Ambulatory Visit: Admitting: Student

## 2024-01-17 ENCOUNTER — Encounter

## 2024-01-18 DIAGNOSIS — M1611 Unilateral primary osteoarthritis, right hip: Secondary | ICD-10-CM | POA: Diagnosis not present

## 2024-01-18 DIAGNOSIS — S72141A Displaced intertrochanteric fracture of right femur, initial encounter for closed fracture: Secondary | ICD-10-CM | POA: Diagnosis not present

## 2024-01-19 ENCOUNTER — Telehealth (HOSPITAL_BASED_OUTPATIENT_CLINIC_OR_DEPARTMENT_OTHER): Payer: Self-pay | Admitting: *Deleted

## 2024-01-19 NOTE — Telephone Encounter (Signed)
   Pre-operative Risk Assessment    Patient Name: [name]  DOB: [birth year] MRN: [id number]   Date of last office visit: 04/24/21 DR. NISHAN Date of next office visit: 01/25/24 [name], [facility]   Request for Surgical Clearance    Procedure:  CONVERSION TO RIGHT THA WITH HARDWARE REMOVAL-POSTERIOR  Date of Surgery:  Clearance 02/07/24                                Surgeon:  DR. MATTHEW OLIN Surgeon's Group or Practice Name:  [facility] Phone number:  [phone] [name] Fax number:  [phone]   Type of Clearance Requested:   - Medical  - Pharmacy:  Hold Warfarin (Coumadin )     Type of Anesthesia:  Spinal   Additional requests/questions:    [name], [name]   01/19/2024, 10:17 AM

## 2024-01-20 NOTE — Telephone Encounter (Signed)
 Left message to call back to schedule tele pre op appt.

## 2024-01-20 NOTE — Telephone Encounter (Signed)
 Patient with diagnosis of Afib and MVR on warfarin for anticoagulation.    Procedure: CONVERSION TO RIGHT THA WITH HARDWARE REMOVAL-POSTERIOR   Date of procedure: 02/07/24   CHA2DS2-VASc Score = 7   This indicates a 11.2% annual risk of stroke. The patient's score is based upon: CHF History: 1 HTN History: 1 Diabetes History: 1 Stroke History: 0 Vascular Disease History: 1 Age Score: 2 Gender Score: 1   CrCl 41 mL/min ( SrCr 1.46 05/2023) based on adj BW  Platelet count 273 K   Patient has not had an Afib/aflutter ablation or Watchman within the last 3 months or DCCV within the last 30 days    Per office protocol, patient can hold Warfarin for 5 days prior to procedure.   Patient will  need bridging with Lovenox (enoxaparin) around procedure. Coumadin  clinic apt is on 09/29 will co-ordinate bridge schedule at that visit.   **This guidance is not considered finalized until pre-operative APP has relayed final recommendations.**

## 2024-01-20 NOTE — Telephone Encounter (Signed)
   Name: [name]  DOB: 1951/02/18  MRN: [id number]  Primary Cardiologist: Maude Emmer, MD   Preoperative team, please contact this patient and set up a phone call appointment ASAP for further preoperative risk assessment. Please obtain consent and complete medication review. Thank you for your help.  I confirm that guidance regarding antiplatelet and oral anticoagulation therapy has been completed and, if necessary, noted below.  Per office protocol, patient can hold Warfarin for 5 days prior to procedure.   Patient will  need bridging with Lovenox (enoxaparin) around procedure. Coumadin  clinic apt is on 09/29 will co-ordinate bridge schedule at that visit.   I also confirmed the patient resides in the state of  . As per [facility] telemedicine laws, the patient must reside in the state in which the provider is licensed.   [name], NP 01/20/2024, 4:38 PM [facility]

## 2024-01-23 ENCOUNTER — Ambulatory Visit: Attending: Cardiovascular Disease | Admitting: *Deleted

## 2024-01-23 DIAGNOSIS — Z5181 Encounter for therapeutic drug level monitoring: Secondary | ICD-10-CM | POA: Diagnosis not present

## 2024-01-23 DIAGNOSIS — I4891 Unspecified atrial fibrillation: Secondary | ICD-10-CM | POA: Diagnosis not present

## 2024-01-23 LAB — POCT INR
INR: 2.6 (ref 2.0–3.0)
INR: 2.6 (ref 2.0–3.0)

## 2024-01-23 MED ORDER — ENOXAPARIN SODIUM 100 MG/ML IJ SOSY: 100.0000 mg | PREFILLED_SYRINGE | Freq: Two times a day (BID) | INTRAMUSCULAR | 1 refills | Status: AC

## 2024-01-23 NOTE — Patient Instructions (Signed)
 02/07/24  Rt Total Hip  Labs: 06/17/23  Scr 1.46  CrCl 53.08  Hgb 7.5  Hct 22.9  Plts 273 (awaiting preop labs next week)  Wt 101kg  Enoxaparin 100mg  sq at 7am and 7pm sent to [facility]  #10 x 1 refill  10/8  Last dose of warfarin 10/9  No Lovenox or warfarin 10/10 - 10/12  Lovenox 100mg  sq at 7am & 7pm 10/13  Lovenox 100mg  sq at 7am ----------No Lovenox in PM 10/14  No Lovenox in AM --------surgery-------admit to hospital.  Follow hospital instructions at discharge.

## 2024-01-23 NOTE — Progress Notes (Signed)
 INR 2.6; Please see anticoagulation encounter

## 2024-01-24 NOTE — Telephone Encounter (Signed)
 Pt has appt with Laymon Qua, [facility] 01/25/24 appt notes *EKG send mychart message heart flutters   Pt will need preop clearance as well. I will update the appt notes.   Will update all parties involved.

## 2024-01-25 ENCOUNTER — Ambulatory Visit: Attending: Student | Admitting: Student

## 2024-01-25 ENCOUNTER — Encounter: Payer: Self-pay | Admitting: Student

## 2024-01-25 VITALS — BP 136/80 | HR 60 | Ht 68.0 in | Wt 225.6 lb

## 2024-01-25 DIAGNOSIS — Z01810 Encounter for preprocedural cardiovascular examination: Secondary | ICD-10-CM

## 2024-01-25 DIAGNOSIS — I480 Paroxysmal atrial fibrillation: Secondary | ICD-10-CM

## 2024-01-25 DIAGNOSIS — I10 Essential (primary) hypertension: Secondary | ICD-10-CM

## 2024-01-25 DIAGNOSIS — I421 Obstructive hypertrophic cardiomyopathy: Secondary | ICD-10-CM

## 2024-01-25 DIAGNOSIS — I2510 Atherosclerotic heart disease of native coronary artery without angina pectoris: Secondary | ICD-10-CM

## 2024-01-25 DIAGNOSIS — Z79899 Other long term (current) drug therapy: Secondary | ICD-10-CM

## 2024-01-25 DIAGNOSIS — Z952 Presence of prosthetic heart valve: Secondary | ICD-10-CM | POA: Diagnosis not present

## 2024-01-25 DIAGNOSIS — E785 Hyperlipidemia, unspecified: Secondary | ICD-10-CM

## 2024-01-25 DIAGNOSIS — I442 Atrioventricular block, complete: Secondary | ICD-10-CM

## 2024-01-25 MED ORDER — METOPROLOL SUCCINATE ER 25 MG PO TB24: 25.0000 mg | ORAL_TABLET | Freq: Two times a day (BID) | ORAL | 3 refills | Status: AC

## 2024-01-25 NOTE — Patient Instructions (Addendum)
 Medication Instructions:   Follow instructions for Coumadin /Lovenox as provided by Olam Bathe, RN for your upcoming surgery.   *If you need a refill on your cardiac medications before your next appointment, please call your pharmacy*  Lab Work:  TSH, LFT's, FLP.   If you have labs (blood work) drawn today and your tests are completely normal, you will receive your results only by: MyChart Message (if you have MyChart) OR A paper copy in the mail If you have any lab test that is abnormal or we need to change your treatment, we will call you to review the results.  Follow-Up: At [facility], you and your health needs are our priority.  As part of our continuing mission to provide you with exceptional heart care, our providers are all part of one team.  This team includes your primary Cardiologist (physician) and Advanced Practice Providers or APPs (Physician Assistants and Nurse Practitioners) who all work together to provide you with the care you need, when you need it.  Your next appointment:   6 month(s)  Provider:   You may see Maude Emmer, MD or one of the following Advanced Practice Providers on your designated Care Team:   Laymon Qua, PA-C  [place], [place] Olivia [place]avy, [place]     We recommend signing up for the patient portal called MyChart.  Sign up information is provided on this After Visit Summary.  MyChart is used to connect with patients for Virtual Visits (Telemedicine).  Patients are able to view lab/test results, encounter notes, upcoming appointments, etc.  Non-urgent messages can be sent to your provider as well.   To learn more about what you can do with MyChart, go to [email or web address].

## 2024-01-25 NOTE — Progress Notes (Signed)
 Cardiology Office Note    Date:  01/25/2024  ID:  [name], DOB 02/11/1951, MRN [id number] Cardiologist: Maude Emmer, MD Cardiology APP:  Johnson Laymon HERO, PA-C  Electrophysiologist:  Danelle Birmingham, MD { :  History of Present Illness:    [name] is a 73 y.o. 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  She most recently had a telehealth visit with Dr. Emmer in 08/2023 and had recently broke her hip in 05/2023 and undergone surgical repair. Denied any recent anginal symptoms at that time. Follow-up labs were recommended including LFT's and thyroid  function but it does not appear these were obtained.   The office received a cardiac clearance request on 01/19/2024 for upcoming hip surgery. Listed that she was going to receive spinal anesthesia. Given her mechanical valve, pharmacy recommended bridging with Lovenox and this was arranged at her appointment on 01/23/2024. She is tentatively scheduled for surgery on 02/07/2024.  In talking with the patient today, she reports she was having more frequent palpitations but realized that she was only taking half of her Toprol -XL dose. She has since resumed Toprol -XL 25 mg twice daily as she was previously on this and tolerated well.  Denies any recurrent palpitations. Breathing has overall been stable and no specific orthopnea, PND or pitting edema. No recent exertional chest pain. Her activity is overall limited given hip pain and she uses a walker at home and wheelchair as needed. She remains on  Coumadin  for anticoagulation with no reports of active bleeding.  Studies Reviewed:   EKG: EKG is ordered today and demonstrates:   EKG Interpretation Date/Time:  Wednesday January 25 2024 15:36:23 EDT Ventricular Rate:  60 PR Interval:  120 QRS Duration:  128 QT Interval:  494 QTC Calculation: 494 R Axis:   -78  Text Interpretation: AV dual-paced rhythm When compared with ECG of 11-Jun-2023 13:32, PREVIOUS ECG IS PRESENT Confirmed by Johnson Laymon ([id number]) on 01/25/2024 3:42:26 PM       R/LHC: 07/2020 1.  Mild distal left main narrowing with no significant stenosis, estimated at 30% 2.  Patent LAD with mild nonobstructive coronary disease 3.  Patent intermediate branch with no significant stenosis 4.  Patent circumflex and no significant stenosis 5.  Patent, nondominant RCA with mild diffuse nonobstructive plaquing 6.  Peak to peak transaortic valve gradient of 81 mmHg, peak instantaneous gradient 81 mmHg, mean gradient 52 mmHg   Discussion: 2 separate pullback gradients were measured.  The first pullback demonstrates no significant pressure gradient.  The second pullback which incorporates post PVC beats demonstrates a high gradient outlined above.  The aortic valve is crossed multiple times with a J-wire without any difficulty.  I suspect the pressure gradient is reflective of both LVOT and aortic valve pathology with a significant component of subaortic stenosis.  There is marked augmentation of LV systolic pressure with post PVC beats.  Will review her case with our multidisciplinary heart valve team.  My initial impression is that TAVR alone would leave the patient with significant subvalvular stenosis and a large gradient without much clinical  benefit.  Cardiac Cath: 09/2022 (at [facility]) Impressions:   1. Low cardiac index at rest with PCWP 15 mmHg.  Moderate elevation of PA pressures; PVR 4.3 Wu.  RA pressure 10 mmHg.  2.  Moderate coronary atherosclerosis; no obstructive CAD  requiring revascularization.  3.  Calcification of the aortic valve leaflets and mitral valve annulus evident.    TEE: 01/2023 IMPRESSIONS     1. Left ventricular ejection fraction, by estimation, is 60 to 65%. The  left ventricle has normal function. The left ventricle has no regional  wall motion abnormalities. Left ventricular diastolic function could not  be evaluated.   2. Right ventricular systolic function is normal. The right ventricular  size is normal.   3. No left atrial/left atrial appendage thrombus was detected. The LAA  emptying velocity was 25 cm/s.   4. The mitral valve has been repaired/replaced. Mild mitral valve  regurgitation. No evidence of mitral stenosis. There is a 25 mm Regent  mechanical valve present in the mitral position. Procedure Date:  10/19/2022.   5. The tricuspid valve is abnormal. Tricuspid valve regurgitation is  moderate.   6. The aortic valve has been repaired/replaced. Aortic valve  regurgitation is not visualized. No aortic stenosis is present. There is a  21 mm Inspiris Edwards bioprosthetic valve present in the aortic position.  Procedure Date: 10/19/2022.   Risk Assessment/Calculations:    CHA2DS2-VASc Score = 7  This indicates a 11.2% annual risk of stroke. The patient's score is based upon: CHF History: 1 HTN History: 1 Diabetes History: 1 Stroke History: 0 Vascular Disease History: 1 Age Score: 2 Gender Score: 1    Physical Exam:   VS:  BP 136/80 (BP Location: Right Arm, Cuff Size: Normal)   Pulse 60   Ht 5' 8 (1.727 m)   Wt 225 lb 9.6 oz (102.3 kg)   LMP 04/26/1998 (Within Years)   SpO2 95%   BMI 34.30 kg/m    Wt Readings from Last 3 Encounters:  01/25/24 225 lb 9.6 oz (102.3 kg)  09/15/23 215 lb (97.5 kg)  06/11/23 235 lb (106.6 kg)     GEN: Well nourished, well developed female appearing in no acute distress NECK: No JVD; No carotid bruits CARDIAC: RRR, crisp mechanical valve sounds present.   RESPIRATORY:  Clear to auscultation without rales, wheezing or rhonchi  ABDOMEN: Appears non-distended. No obvious abdominal masses. EXTREMITIES: No clubbing or cyanosis. No pitting edema.  Distal pedal pulses are 2+ bilaterally.   Assessment and Plan:   1. HOCM (hypertrophic obstructive cardiomyopathy) (HCC) - She has a history of HOCM and underwent septal myectomy at [facility] in 09/2022. TEE in 01/2023 showed a preserved EF of 60 to 65% and no mention of significant gradient. Continue Toprol -XL 25mg  BID.   2. 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 TEE in 01/2023 showed the aortic valve was functioning normally with no stenosis or regurgitation and mitral valve was functioning normally as well with only mild MR. Would plan for a repeat echocardiogram in 1-2 years for reassessment.   3. PAF (paroxysmal atrial fibrillation) (HCC) - Initially diagnosed in the postoperative setting following surgery in 09/2022 but she did have recurrence in 01/2023 requiring DCCV. Previously experienced occasional palpitations but resolved once resuming Toprol -XL 25mg  BID. Continue this along with Amiodarone  200mg  daily but will recheck TSH and LFT's.  - No reports of  active bleeding. Continue Coumadin  for anticoagulation.   4. Heart block AV complete (HCC) - She is s/p Biotronik PPM placement which is followed by Dr. Waddell. Was functioning normally by device check in 10/2023.  5. CAD/HLD - Cardiac catheterization in 07/2020 showed mild, nonobstructive disease and repeat cardiac catheterization at [facility] in 09/2022 showed moderate coronary atherosclerosis with no obstructive disease. - She denies any recent exertional chest pain. She is not on ASA given the need for anticoagulation. Remains on Zetia  10mg  daily and will recheck an FLP with upcoming labs. She was previously intolerant to statin therapy.   6. Essential  hypertension - BP is at 136/80 during today's visit. Continue Losartan  50 mg daily but will resume Toprol -XL at her prior dose of 25 mg BID.  7. Preoperative cardiovascular examination - While her activity is limited, her RCRI Risk is low at 0.5% risk of a major cardiac event. She will also be receiving spinal anesthesia which is lower risk as well. 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 Coumadin  Clinic has already arranged for Lovenox bridging given her mechanical valve. Will route today's note to the requesting provider.    Signed, Laymon CHRISTELLA Qua, PA-C

## 2024-01-26 ENCOUNTER — Other Ambulatory Visit: Payer: Self-pay | Admitting: Internal Medicine

## 2024-01-30 NOTE — Progress Notes (Signed)
 Remote PPM Transmission

## 2024-01-31 NOTE — Patient Instructions (Signed)
 SURGICAL WAITING ROOM VISITATION  Patients having surgery or a procedure may have no more than 2 support people in the waiting area - these visitors may rotate.    Children under the age of 45 must have an adult with them who is not the patient.  Visitors with respiratory illnesses are discouraged from visiting and should remain at home.  If the patient needs to stay at the hospital during part of their recovery, the visitor guidelines for inpatient rooms apply. Pre-op nurse will coordinate an appropriate time for 1 support person to accompany patient in pre-op.  This support person may not rotate.    Please refer to the [facility] website for the visitor guidelines for Inpatients (after your surgery is over and you are in a regular room).       Your procedure is scheduled on: 02/07/24   Report to [facility] Main Entrance    Report to admitting at 12:10 PM   Call this number if you have problems the morning of surgery [phone]   Do not eat food :After Midnight.   After Midnight you may have the following liquids until 11:40 AM DAY OF SURGERY  Water  Non-Citrus Juices (without pulp, NO RED-Apple, White grape, White cranberry) Black Coffee (NO MILK/CREAM OR CREAMERS, sugar ok)  Clear Tea (NO MILK/CREAM OR CREAMERS, sugar ok) regular and decaf                             Plain Jell-O (NO RED)                                           Fruit ices (not with fruit pulp, NO RED)                                     Popsicles (NO RED)                                                               Sports drinks like Gatorade (NO RED)                The day of surgery:  Drink ONE (1) Pre-Surgery or G2 at 11:40 AM the morning of surgery. Drink in one sitting. Do not sip.  This drink was given to you during your hospital  pre-op appointment visit. Nothing else to drink after completing the  Pre-Surgery  G2.     Oral Hygiene is also important to reduce your risk of  infection.                                    Remember - BRUSH YOUR TEETH THE MORNING OF SURGERY WITH YOUR REGULAR TOOTHPASTE  DENTURES WILL BE REMOVED PRIOR TO SURGERY PLEASE DO NOT APPLY Poly grip OR ADHESIVES!!!   Stop all vitamins and herbal supplements 7 days before surgery.   Take these medicines the morning of surgery with A SIP OF WATER : Amiodarone , ezetimibe , metoprolol , macrodantin , omeprazole, tylenol   DO NOT TAKE ANY ORAL DIABETIC MEDICATIONS DAY OF YOUR SURGERY Hold Metformin  the morning of surgery.             You may not have any metal on your body including hair pins, jewelry, and body piercing             Do not wear make-up, lotions, powders, perfumes/cologne, or deodorant  Do not wear nail polish including gel and S&S, artificial/acrylic nails, or any other type of covering on natural nails including finger and toenails. If you have artificial nails, gel coating, etc. that needs to be removed by a nail salon please have this removed prior to surgery or surgery may need to be canceled/ delayed if the surgeon/ anesthesia feels like they are unable to be safely monitored.   Do not shave  48 hours prior to surgery.    Do not bring valuables to the hospital. [facility] IS NOT             RESPONSIBLE   FOR VALUABLES.   Contacts, glasses, dentures or bridgework may not be worn into surgery.   Bring small overnight bag day of surgery.   DO NOT BRING YOUR HOME MEDICATIONS TO THE HOSPITAL. PHARMACY WILL DISPENSE MEDICATIONS LISTED ON YOUR MEDICATION LIST TO YOU DURING YOUR ADMISSION IN THE HOSPITAL!    Patients discharged on the day of surgery will not be allowed to drive home.  Someone NEEDS to stay with you for the first 24 hours after anesthesia.   Special Instructions: Bring a copy of your healthcare power of attorney and living will documents the day of surgery if you haven't scanned them before.              Please read over the following fact sheets you were  given: IF YOU HAVE QUESTIONS ABOUT YOUR PRE-OP INSTRUCTIONS PLEASE CALL [phone] [place]   If you received a COVID test during your pre-op visit  it is requested that you wear a mask when out in public, stay away from anyone that may not be feeling well and notify your surgeon if you develop symptoms. If you test positive for Covid or have been in contact with anyone that has tested positive in the last 10 days please notify you surgeon.      Pre-operative 4 CHG Bath Instructions  DYNA-Hex 4 Chlorhexidine  Gluconate 4% Solution Antiseptic 4 fl. oz   You can play a key role in reducing the risk of infection after surgery. Your skin needs to be as free of germs as possible. You can reduce the number of germs on your skin by washing with CHG (chlorhexidine  gluconate) soap before surgery. CHG is an antiseptic soap that kills germs and continues to kill germs even after washing.   DO NOT use if you have an allergy to chlorhexidine /CHG or antibacterial soaps. If your skin becomes reddened or irritated, stop using the CHG and notify one of our RNs at   Please shower with the CHG soap starting 4 days before surgery using the following schedule:     Please keep in mind the following:  DO NOT shave, including legs and underarms, starting the day of your first shower.   You may shave your face at any point before/day of surgery.  Place clean sheets on your bed the day you start using CHG soap. Use a clean washcloth (not used since being washed) for each shower. DO NOT sleep with pets once you start using the CHG.  CHG Shower Instructions:  If you choose to wash your hair and private area, wash first with your normal shampoo/soap.  After you use shampoo/soap, rinse your hair and body thoroughly to remove shampoo/soap residue.  Turn the water  OFF and apply about 3 tablespoons (45 ml) of CHG soap to a CLEAN washcloth.  Apply CHG soap ONLY FROM YOUR NECK DOWN TO YOUR TOES (washing for 3-5 minutes)  DO  NOT use CHG soap on face, private areas, open wounds, or sores.  Pay special attention to the area where your surgery is being performed.  If you are having back surgery, having someone wash your back for you may be helpful. Wait 2 minutes after CHG soap is applied, then you may rinse off the CHG soap.  Pat dry with a clean towel  Put on clean clothes/pajamas   If you choose to wear lotion, please use ONLY the CHG-compatible lotions on the back of this paper.     Additional instructions for the day of surgery: DO NOT APPLY any lotions, deodorants, cologne, or perfumes.   Put on clean/comfortable clothes.  Brush your teeth.  Ask your nurse before applying any prescription medications to the skin.   CHG Compatible Lotions   Aveeno Moisturizing lotion  Cetaphil Moisturizing Cream  Cetaphil Moisturizing Lotion  Clairol Herbal Essence Moisturizing Lotion, Dry Skin  Clairol Herbal Essence Moisturizing Lotion, Extra Dry Skin  Clairol Herbal Essence Moisturizing Lotion, Normal Skin  Curel Age Defying Therapeutic Moisturizing Lotion with Alpha Hydroxy  Curel Extreme Care Body Lotion  Curel Soothing Hands Moisturizing Hand Lotion  Curel Therapeutic Moisturizing Cream, Fragrance-Free  Curel Therapeutic Moisturizing Lotion, Fragrance-Free  Curel Therapeutic Moisturizing Lotion, Original Formula  Eucerin Daily Replenishing Lotion  Eucerin Dry Skin Therapy Plus Alpha Hydroxy Crme  Eucerin Dry Skin Therapy Plus Alpha Hydroxy Lotion  Eucerin Original Crme  Eucerin Original Lotion  Eucerin Plus Crme Eucerin Plus Lotion  Eucerin TriLipid Replenishing Lotion  Keri Anti-Bacterial Hand Lotion  Keri Deep Conditioning Original Lotion Dry Skin Formula Softly Scented  Keri Deep Conditioning Original Lotion, Fragrance Free Sensitive Skin Formula  Keri Lotion Fast Absorbing Fragrance Free Sensitive Skin Formula  Keri Lotion Fast Absorbing Softly Scented Dry Skin Formula  Keri Original Lotion  Keri  Skin Renewal Lotion Keri Silky Smooth Lotion  Keri Silky Smooth Sensitive Skin Lotion  Nivea Body Creamy Conditioning Oil  Nivea Body Extra Enriched Lotion  Nivea Body Original Lotion  Nivea Body Sheer Moisturizing Lotion Nivea Crme  Nivea Skin Firming Lotion  NutraDerm 30 Skin Lotion  NutraDerm Skin Lotion  NutraDerm Therapeutic Skin Cream  NutraDerm Therapeutic Skin Lotion  ProShield Protective Hand Cream  Incentive Spirometer  An incentive spirometer is a tool that can help keep your lungs clear and active. This tool measures how well you are filling your lungs with each breath. Taking long deep breaths may help reverse or decrease the chance of developing breathing (pulmonary) problems (especially infection) following: A long period of time when you are unable to move or be active. BEFORE THE PROCEDURE  If the spirometer includes an indicator to show your best effort, your nurse or respiratory therapist will set it to a desired goal. If possible, sit up straight or lean slightly forward. Try not to slouch. Hold the incentive spirometer in an upright position. INSTRUCTIONS FOR USE  Sit on the edge of your bed if possible, or sit up as far as you can in bed or on a chair. Hold the incentive spirometer in  an upright position. Breathe out normally. Place the mouthpiece in your mouth and seal your lips tightly around it. Breathe in slowly and as deeply as possible, raising the piston or the ball toward the top of the column. Hold your breath for 3-5 seconds or for as long as possible. Allow the piston or ball to fall to the bottom of the column. Remove the mouthpiece from your mouth and breathe out normally. Rest for a few seconds and repeat Steps 1 through 7 at least 10 times every 1-2 hours when you are awake. Take your time and take a few normal breaths between deep breaths. The spirometer may include an indicator to show your best effort. Use the indicator as a goal to work toward  during each repetition. After each set of 10 deep breaths, practice coughing to be sure your lungs are clear. If you have an incision (the cut made at the time of surgery), support your incision when coughing by placing a pillow or rolled up towels firmly against it. Once you are able to get out of bed, walk around indoors and cough well. You may stop using the incentive spirometer when instructed by your caregiver.  RISKS AND COMPLICATIONS Take your time so you do not get dizzy or light-headed. If you are in pain, you may need to take or ask for pain medication before doing incentive spirometry. It is harder to take a deep breath if you are having pain. AFTER USE Rest and breathe slowly and easily. It can be helpful to keep track of a log of your progress. Your caregiver can provide you with a simple table to help with this. If you are using the spirometer at home, follow these instructions: SEEK MEDICAL CARE IF:  You are having difficultly using the spirometer. You have trouble using the spirometer as often as instructed. Your pain medication is not giving enough relief while using the spirometer. You develop fever of 100.5 F (38.1 C) or higher. SEEK IMMEDIATE MEDICAL CARE IF:  You cough up bloody sputum that had not been present before. You develop fever of 102 F (38.9 C) or greater. You develop worsening pain at or near the incision site. MAKE SURE YOU:  Understand these instructions. Will watch your condition. Will get help right away if you are not doing well or get worse. Document Released: 08/23/2006 Document Revised: 07/05/2011 Document Reviewed: 10/24/2006   WHAT IS A BLOOD TRANSFUSION? Blood Transfusion Information  A transfusion is the replacement of blood or some of its parts. Blood is made up of multiple cells which provide different functions. Red blood cells carry oxygen and are used for blood loss replacement. White blood cells fight against infection. Platelets  control bleeding. Plasma helps clot blood. Other blood products are available for specialized needs, such as hemophilia or other clotting disorders. BEFORE THE TRANSFUSION  Who gives blood for transfusions?  Healthy volunteers who are fully evaluated to make sure their blood is safe. This is blood bank blood. Transfusion therapy is the safest it has ever been in the practice of medicine. Before blood is taken from a donor, a complete history is taken to make sure that person has no history of diseases nor engages in risky social behavior (examples are intravenous drug use or sexual activity with multiple partners). The donor's travel history is screened to minimize risk of transmitting infections, such as malaria. The donated blood is tested for signs of infectious diseases, such as HIV and hepatitis. The blood is  then tested to be sure it is compatible with you in order to minimize the chance of a transfusion reaction. If you or a relative donates blood, this is often done in anticipation of surgery and is not appropriate for emergency situations. It takes many days to process the donated blood. RISKS AND COMPLICATIONS Although transfusion therapy is very safe and saves many lives, the main dangers of transfusion include:  Getting an infectious disease. Developing a transfusion reaction. This is an allergic reaction to something in the blood you were given. Every precaution is taken to prevent this. The decision to have a blood transfusion has been considered carefully by your caregiver before blood is given. Blood is not given unless the benefits outweigh the risks. AFTER THE TRANSFUSION Right after receiving a blood transfusion, you will usually feel much better and more energetic. This is especially true if your red blood cells have gotten low (anemic). The transfusion raises the level of the red blood cells which carry oxygen, and this usually causes an energy increase. The nurse administering the  transfusion will monitor you carefully for complications. HOME CARE INSTRUCTIONS  No special instructions are needed after a transfusion. You may find your energy is better. Speak with your caregiver about any limitations on activity for underlying diseases you may have. SEEK MEDICAL CARE IF:  Your condition is not improving after your transfusion. You develop redness or irritation at the intravenous (IV) site. SEEK IMMEDIATE MEDICAL CARE IF:  Any of the following symptoms occur over the next 12 hours: Shaking chills. You have a temperature by mouth above 102 F (38.9 C), not controlled by medicine. Chest, back, or muscle pain. People around you feel you are not acting correctly or are confused. Shortness of breath or difficulty breathing. Dizziness and fainting. You get a rash or develop hives. You have a decrease in urine output. Your urine turns a dark color or changes to pink, red, or brown. Any of the following symptoms occur over the next 10 days: You have a temperature by mouth above 102 F (38.9 C), not controlled by medicine. Shortness of breath. Weakness after normal activity. The white part of the eye turns yellow (jaundice). You have a decrease in the amount of urine or are urinating less often. Your urine turns a dark color or changes to pink, red, or brown.  How to Manage Your Diabetes Before and After Surgery  Why is it important to control my blood sugar before and after surgery? Improving blood sugar levels before and after surgery helps healing and can limit problems. A way of improving blood sugar control is eating a healthy diet by:  Eating less sugar and carbohydrates  Increasing activity/exercise  Talking with your doctor about reaching your blood sugar goals High blood sugars (greater than 180 mg/dL) can raise your risk of infections and slow your recovery, so you will need to focus on controlling your diabetes during the weeks before surgery. Make sure  that the doctor who takes care of your diabetes knows about your planned surgery including the date and location.  How do I manage my blood sugar before surgery? Check your blood sugar at least 4 times a day, starting 2 days before surgery, to make sure that the level is not too high or low. Check your blood sugar the morning of your surgery when you wake up and every 2 hours until you get to the Short Stay unit. If your blood sugar is less than 70 mg/dL, you  will need to treat for low blood sugar: Do not take insulin . Treat a low blood sugar (less than 70 mg/dL) with  cup of clear juice (cranberry or apple), 4 glucose tablets, OR glucose gel. Recheck blood sugar in 15 minutes after treatment (to make sure it is greater than 70 mg/dL). If your blood sugar is not greater than 70 mg/dL on recheck, call [phone] for further instructions. Report your blood sugar to the short stay nurse when you get to Short Stay.  If you are admitted to the hospital after surgery: Your blood sugar will be checked by the staff and you will probably be given insulin  after surgery (instead of oral diabetes medicines) to make sure you have good blood sugar levels. The goal for blood sugar control after surgery is 80-180 mg/dL.   WHAT DO I DO ABOUT MY DIABETES MEDICATION?  Do not take oral diabetes medicines (pills) the morning of surgery.  DO NOT TAKE THE FOLLOWING 7 DAYS PRIOR TO SURGERY: Ozempic, Wegovy, Rybelsus (Semaglutide), Byetta (exenatide), Bydureon (exenatide ER), Victoza, Saxenda (liraglutide), or Trulicity (dulaglutide) Mounjaro (Tirzepatide) Adlyxin (Lixisenatide), Polyethylene Glycol Loxenatide.  Patient Signature:  Date:   Nurse Signature:  Date:   Reviewed and Endorsed by [facility] Patient Education Committee, August 2015

## 2024-01-31 NOTE — Progress Notes (Signed)
 COVID Vaccine received:  []  No [x]  Yes Date of any COVID positive Test in last 90 days: no PCP - Dr. Norleen General Cardiologist - Dr. Maude Emmer Electrophys.- Dr. Danelle Birmingham  Chest x-ray - 06/11/23 Epic EKG -  01/25/24 Epic Stress Test -  ECHO - 06/27/20 Epic Cardiac Cath - 09/2022 At [facility]  Cardiac clearance-01/25/24- Laymon Qua PA-C Request sent for pacemaker orders  Bowel Prep - [x]  No  []   Yes ______  Pacemaker / ICD device []  No [x]  Yes   Spinal Cord Stimulator:[x]  No []  Yes       History of Sleep Apnea? [x]  No []  Yes   CPAP used?- [x]  No []  Yes    Does the patient monitor blood sugar?          [x]  No []  Yes  []  N/A  Patient has: []  NO Hx DM   []  Pre-DM                 []  DM1  [x]   DM2 Does patient have a [employer] or Dexacom? [x]  No []  Yes   Fasting Blood Sugar Ranges-  Checks Blood Sugar ____0_ times a day  GLP1 agonist / usual dose - no GLP1 instructions:  SGLT-2 inhibitors / usual dose - no SGLT-2 instructions:   Blood Thinner / Instructions:Warfarin last dose 02/01/24 PM Lovenox bridge starts 02/03/24 Aspirin  Instructions:no  Comments:   Activity level: Patient is able  to climb a flight of stairs without difficulty; [x]  No CP  [x]  No SOB,    Patient can perform ADLs without assistance.   Anesthesia review: A-fib, HTN, DM,CKD, Pacemaker, Aortic valve replaced.  Patient denies shortness of breath, fever, cough and chest pain at PAT appointment.  Patient verbalized understanding and agreement to the Pre-Surgical Instructions that were given to them at this PAT appointment. Patient was also educated of the need to review these PAT instructions again prior to his/her surgery.I reviewed the appropriate phone numbers to call if they have any and questions or concerns.

## 2024-02-02 ENCOUNTER — Other Ambulatory Visit: Payer: Self-pay

## 2024-02-02 ENCOUNTER — Encounter: Payer: Self-pay | Admitting: Internal Medicine

## 2024-02-02 ENCOUNTER — Encounter: Payer: Self-pay | Admitting: Family Medicine

## 2024-02-02 ENCOUNTER — Encounter: Payer: Self-pay | Admitting: *Deleted

## 2024-02-02 ENCOUNTER — Encounter (HOSPITAL_COMMUNITY)
Admission: RE | Admit: 2024-02-02 | Discharge: 2024-02-02 | Disposition: A | Discharge status: Home / Self Care | Source: Ambulatory Visit | Attending: Orthopedic Surgery | Admitting: Orthopedic Surgery

## 2024-02-02 ENCOUNTER — Encounter (HOSPITAL_COMMUNITY): Payer: Self-pay

## 2024-02-02 VITALS — BP 157/113 | Temp 98.5°F | Resp 20 | Ht 68.0 in | Wt 225.0 lb

## 2024-02-02 DIAGNOSIS — Z9889 Other specified postprocedural states: Secondary | ICD-10-CM | POA: Diagnosis not present

## 2024-02-02 DIAGNOSIS — E785 Hyperlipidemia, unspecified: Secondary | ICD-10-CM | POA: Insufficient documentation

## 2024-02-02 DIAGNOSIS — I480 Paroxysmal atrial fibrillation: Secondary | ICD-10-CM | POA: Diagnosis not present

## 2024-02-02 DIAGNOSIS — Z952 Presence of prosthetic heart valve: Secondary | ICD-10-CM | POA: Insufficient documentation

## 2024-02-02 DIAGNOSIS — Z87891 Personal history of nicotine dependence: Secondary | ICD-10-CM | POA: Diagnosis not present

## 2024-02-02 DIAGNOSIS — Z8679 Personal history of other diseases of the circulatory system: Secondary | ICD-10-CM | POA: Diagnosis not present

## 2024-02-02 DIAGNOSIS — Z96641 Presence of right artificial hip joint: Secondary | ICD-10-CM | POA: Diagnosis not present

## 2024-02-02 DIAGNOSIS — Y831 Surgical operation with implant of artificial internal device as the cause of abnormal reaction of the patient, or of later complication, without mention of misadventure at the time of the procedure: Secondary | ICD-10-CM | POA: Insufficient documentation

## 2024-02-02 DIAGNOSIS — Z950 Presence of cardiac pacemaker: Secondary | ICD-10-CM | POA: Insufficient documentation

## 2024-02-02 DIAGNOSIS — E1169 Type 2 diabetes mellitus with other specified complication: Secondary | ICD-10-CM | POA: Diagnosis not present

## 2024-02-02 DIAGNOSIS — Z7984 Long term (current) use of oral hypoglycemic drugs: Secondary | ICD-10-CM | POA: Insufficient documentation

## 2024-02-02 DIAGNOSIS — I10 Essential (primary) hypertension: Secondary | ICD-10-CM | POA: Insufficient documentation

## 2024-02-02 DIAGNOSIS — Z01818 Encounter for other preprocedural examination: Secondary | ICD-10-CM

## 2024-02-02 DIAGNOSIS — Z01812 Encounter for preprocedural laboratory examination: Secondary | ICD-10-CM | POA: Insufficient documentation

## 2024-02-02 DIAGNOSIS — M25551 Pain in right hip: Secondary | ICD-10-CM

## 2024-02-02 DIAGNOSIS — T8484XA Pain due to internal orthopedic prosthetic devices, implants and grafts, initial encounter: Secondary | ICD-10-CM | POA: Diagnosis not present

## 2024-02-02 DIAGNOSIS — Z7901 Long term (current) use of anticoagulants: Secondary | ICD-10-CM | POA: Diagnosis not present

## 2024-02-02 HISTORY — DX: Presence of cardiac pacemaker: Z95.0

## 2024-02-02 HISTORY — DX: Dyspnea, unspecified: R06.00

## 2024-02-02 LAB — SURGICAL PCR SCREEN
MRSA, PCR: NEGATIVE
Staphylococcus aureus: NEGATIVE

## 2024-02-02 LAB — BASIC METABOLIC PANEL WITH GFR
Anion gap: 14 (ref 5–15)
BUN: 22 mg/dL (ref 8–23)
CO2: 22 mmol/L (ref 22–32)
Calcium: 9.5 mg/dL (ref 8.9–10.3)
Chloride: 106 mmol/L (ref 98–111)
Creatinine, Ser: 1.25 mg/dL — ABNORMAL HIGH (ref 0.44–1.00)
GFR, Estimated: 45 mL/min — ABNORMAL LOW (ref >60)
Glucose, Bld: 112 mg/dL — ABNORMAL HIGH (ref 70–99)
Potassium: 4.3 mmol/L (ref 3.5–5.1)
Sodium: 141 mmol/L (ref 135–145)

## 2024-02-02 LAB — CBC
HCT: 38.5 % (ref 36.0–46.0)
Hemoglobin: 11.5 g/dL — ABNORMAL LOW (ref 12.0–15.0)
MCH: 27.6 pg (ref 26.0–34.0)
MCHC: 29.9 g/dL — ABNORMAL LOW (ref 30.0–36.0)
MCV: 92.5 fL (ref 80.0–100.0)
Platelets: 302 K/uL (ref 150–400)
RBC: 4.16 MIL/uL (ref 3.87–5.11)
RDW: 13.5 % (ref 11.5–15.5)
WBC: 11.1 K/uL — ABNORMAL HIGH (ref 4.0–10.5)
nRBC: 0 % (ref 0.0–0.2)

## 2024-02-02 LAB — HEMOGLOBIN A1C
Hgb A1c MFr Bld: 6.2 % — ABNORMAL HIGH (ref 4.8–5.6)
Mean Plasma Glucose: 131.24 mg/dL

## 2024-02-02 LAB — GLUCOSE, CAPILLARY: Glucose-Capillary: 131 mg/dL — ABNORMAL HIGH (ref 70–99)

## 2024-02-02 NOTE — Progress Notes (Signed)
 PERIOPERATIVE PRESCRIPTION FOR IMPLANTED CARDIAC DEVICE PROGRAMMING  Patient Information: Name:  GWENETTA DEVOS  DOB:  November 23, 1950  MRN:  [id number]    Name: Lynett Brasil DOB: 1951-01-28 Planned Procedure:  right total hip revision  Surgeon:  CHRISTELLA. Ernie  Date of Procedure:  02/07/24  Cautery will be used.  Position during surgery:  unknown   Please send documentation back to:  Darryle Law (Fax # [phone])   Device Information:  Clinic EP Physician:  Danelle Birmingham, MD   Device Type:  Pacemaker Manufacturer and Phone #:  [employer]: [phone] Pacemaker Dependent?:  Yes.   Date of Last Device Check:  11/11/2023  Normal Device Function?:  Yes.    Electrophysiologist's Recommendations:  Have magnet available. Provide continuous ECG monitoring when magnet is used or reprogramming is to be performed.  Procedure should not interfere with device function.  No device programming or magnet placement needed.  Per Device Clinic Standing Orders, Almarie ONEIDA Shutter, RN  2:56 PM 02/02/2024

## 2024-02-02 NOTE — Telephone Encounter (Signed)
 Labs requested

## 2024-02-03 NOTE — Progress Notes (Signed)
 Anesthesia Chart Review   Case: [id number] Date/Time: 02/07/24 1425   Procedures:      CONVERSION, PREVIOUS HIP SURGERY, TO TOTAL HIP ARTHROPLASTY (Right: Hip) - Conversion to right total hip arthroplasty     REMOVAL, HARDWARE (Right: Hip) - with hardware removal-posterior   Anesthesia type: Spinal   Diagnosis: Pain due to right hip joint prosthesis, initial encounter [U15.15KJ, Z96.641]   Pre-op diagnosis: Failed Right hip surgery   Location: [facility] ROOM 09 / [facility]   Surgeons: Ernie Cough, MD       DISCUSSION:73 y.o. former smoker with h/o HTN, PAF, HOCM and aortic stenosis (s/p AVR with 21 mm Inspiris Edwards valve, mechanical MVR with 25 mm Regent valve and septal myectomy at [facility] in 09/2022), heart block s/p PPM, failed right hip surgery scheduled for above procedure 02/07/2024 with Dr. Cough Ernie.   Device orders in progress note 02/02/2024, procedure should not interfere with device function.  No programming or magnet placement needed.   Pt last seen by cardiology 01/25/2024. Per OV note, - While her activity is limited, her RCRI Risk is low at 0.5% risk of a major cardiac event. She will also be receiving spinal anesthesia which is lower risk as well. 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 Coumadin  Clinic has already arranged for Lovenox bridging given her mechanical valve. Will route today's note to the requesting provider.   Warfarin last dose 02/01/2024 with Lovenox bridge.  Last Lovenox AM of 02/06/2024. Pt given instructions by pharmacy.  VS: BP (!) 157/113   Temp 36.9 C (Oral)   Resp 20   Ht 5' 8 (1.727 m)   Wt 102.1 kg   LMP 04/26/1998 (Within Years)   SpO2 97%   BMI 34.21 kg/m   PROVIDERS: Marvine Rush, MD is PCP  Cardiologist - Dr. Maude Emmer Electrophys.- Dr. Danelle Birmingham LABS: Labs reviewed:  Acceptable for surgery. (all labs ordered are listed, but only abnormal results are displayed)  Labs Reviewed  HEMOGLOBIN A1C - Abnormal; Notable for the following components:      Result Value   Hgb A1c MFr Bld 6.2 (*)    All other components within normal limits  BASIC METABOLIC PANEL WITH GFR - Abnormal; Notable for the following components:   Glucose, Bld 112 (*)    Creatinine, Ser 1.25 (*)    GFR, Estimated 45 (*)    All other components within normal limits  CBC - Abnormal; Notable for the following components:   WBC 11.1 (*)    Hemoglobin 11.5 (*)    MCHC 29.9 (*)    All other components within normal limits  GLUCOSE, CAPILLARY - Abnormal; Notable for the following components:   Glucose-Capillary 131 (*)    All other components within normal limits  SURGICAL PCR SCREEN  TYPE AND SCREEN     IMAGES:   EKG:   CV: Echo 02/02/2023 1. Left ventricular ejection fraction, by estimation, is 60 to 65%. The  left ventricle has normal function. The left ventricle has no regional  wall motion abnormalities. Left ventricular diastolic function could not  be evaluated.   2. Right ventricular systolic function is normal. The right ventricular  size is normal.   3. No left atrial/left atrial appendage thrombus was detected. The LAA  emptying velocity was 25 cm/s.   4. The mitral valve  has been repaired/replaced. Mild mitral valve  regurgitation. No evidence of mitral stenosis. There is a 25 mm Regent  mechanical valve present in the mitral position. Procedure Date:  10/19/2022.   5. The tricuspid valve is abnormal. Tricuspid valve regurgitation is  moderate.   6. The aortic valve has been repaired/replaced. Aortic valve  regurgitation is not visualized. No aortic stenosis is present. There is a  21 mm Inspiris Edwards bioprosthetic valve present in the aortic position.  Procedure Date: 10/19/2022.  Past Medical History:  Diagnosis Date   Aortic stenosis    21 mm Inspiris  Edwards AVR June 2024 - [facility]   Arthritis    Cervical incompetence    Dyspnea    GERD (gastroesophageal reflux disease)    Hay fever    Heart block    [employer] DCPPM on 10/26/2022 with Dr. Saturnino - [facility]   Hemochromatosis    HOCM (hypertrophic obstructive cardiomyopathy) [facility])    Septal myomectomy June 2024 - [facility]   Hypertension    Migraines    Mitral stenosis    25 mm Regent mechanical MVR June 2024 - [facility]   Paroxysmal atrial fibrillation (HCC)    Presence of permanent cardiac pacemaker    Sciatica    Type 2 diabetes mellitus (HCC)     Past Surgical History:  Procedure Laterality Date   CARDIAC SURGERY     CARDIOVERSION N/A 02/02/2023   Procedure: CARDIOVERSION;  Surgeon: Stacia Diannah SQUIBB, MD;  Location: [facility];  Service: Cardiovascular;  Laterality: N/A;   CARPAL TUNNEL RELEASE Right    COLONOSCOPY N/A 08/01/2014   Procedure: COLONOSCOPY;  Surgeon: Claudis RAYMOND Rivet, MD;  Location: [facility];  Service: Endoscopy;  Laterality: N/A;  900 -- moved to 10:00 - Ann notified pt   ELBOW SURGERY     INTRAMEDULLARY (IM) NAIL INTERTROCHANTERIC Right 06/12/2023   Procedure: INTRAMEDULLARY (IM) NAIL INTERTROCHANTERIC;  Surgeon: Kendal Franky SQUIBB, MD;  Location: [facility];  Service: Orthopedics;  Laterality: Right;   PACEMAKER INSERTION     June 2024   RIGHT/LEFT HEART CATH AND CORONARY ANGIOGRAPHY N/A 07/28/2020   Procedure: RIGHT/LEFT HEART CATH AND CORONARY ANGIOGRAPHY;  Surgeon: Wonda Sharper, MD;  Location: [facility] INVASIVE CV LAB;  Service: Cardiovascular;  Laterality: N/A;   ROTATOR CUFF REPAIR Right 2012   TEE WITHOUT CARDIOVERSION N/A 02/02/2023   Procedure: TRANSESOPHAGEAL ECHOCARDIOGRAM (TEE);  Surgeon: Mallipeddi, Vishnu P, MD;  Location: [facility];  Service: Cardiovascular;  Laterality: N/A;   TONSILLECTOMY     TOTAL KNEE ARTHROPLASTY Right 10/13/2021   Procedure: TOTAL KNEE ARTHROPLASTY;  Surgeon: Ernie Cough, MD;  Location: [facility];  Service: Orthopedics;  Laterality:  Right;   TUBAL LIGATION     WISDOM TOOTH EXTRACTION      MEDICATIONS:  acetaminophen  (TYLENOL ) 325 MG tablet   amiodarone  (PACERONE ) 200 MG tablet   enoxaparin (LOVENOX) 100 MG/ML injection   ezetimibe  (ZETIA ) 10 MG tablet   losartan  (COZAAR ) 50 MG tablet   magnesium  oxide (MAG-OX) 400 (240 Mg) MG tablet   MELATONIN GUMMIES PO   metFORMIN  (GLUCOPHAGE ) 1000 MG tablet   metoprolol  succinate (TOPROL -XL) 25 MG 24 hr tablet   nitrofurantoin  (MACRODANTIN ) 50 MG capsule   omeprazole (PRILOSEC) 20 MG capsule   warfarin (COUMADIN ) 2.5 MG tablet   No current facility-administered medications for this encounter.    Harlene Hoots Ward, PA-C [facility] [phone]

## 2024-02-03 NOTE — Anesthesia Preprocedure Evaluation (Addendum)
 Anesthesia Evaluation  Patient identified by MRN, date of birth, ID band Patient awake    Reviewed: Allergy & Precautions, NPO status , Patient's Chart, lab work & pertinent test results  Airway Mallampati: II  TM Distance: >3 FB Neck ROM: Full    Dental no notable dental hx.    Pulmonary neg pulmonary ROS, former smoker   Pulmonary exam normal        Cardiovascular hypertension, Pt. on medications and Pt. on home beta blockers + dysrhythmias + pacemaker  Rhythm:Regular Rate:Normal + Systolic murmurs Echo 02/02/2023 1. Left ventricular ejection fraction, by estimation, is 60 to 65%. The  left ventricle has normal function. The left ventricle has no regional  wall motion abnormalities. Left ventricular diastolic function could not  be evaluated.   2. Right ventricular systolic function is normal. The right ventricular  size is normal.   3. No left atrial/left atrial appendage thrombus was detected. The LAA  emptying velocity was 25 cm/s.   4. The mitral valve has been repaired/replaced. Mild mitral valve  regurgitation. No evidence of mitral stenosis. There is a 25 mm Regent  mechanical valve present in the mitral position. Procedure Date:  10/19/2022.   5. The tricuspid valve is abnormal. Tricuspid valve regurgitation is  moderate.   6. The aortic valve has been repaired/replaced. Aortic valve  regurgitation is not visualized. No aortic stenosis is present. There is a  21 mm Inspiris Edwards bioprosthetic valve present in the aortic position.  Procedure Date: 10/19/2022.     Neuro/Psych  Headaches  negative psych ROS   GI/Hepatic Neg liver ROS,GERD  ,,  Endo/Other  diabetes    Renal/GU   negative genitourinary   Musculoskeletal  (+) Arthritis , Osteoarthritis,    Abdominal Normal abdominal exam  (+)   Peds  Hematology Lab Results      Component                Value               Date                      WBC                       11.1 (H)            02/02/2024                HGB                      11.5 (L)            02/02/2024                HCT                      38.5                02/02/2024                MCV                      92.5                02/02/2024                PLT  302                 02/02/2024             Lab Results      Component                Value               Date                      INR                      1.3 (H)             02/07/2024                INR                      2.6                 01/23/2024                INR                      2.6                 01/23/2024              Anesthesia Other Findings HTN, PAF, HOCM and aortic stenosis (s/p AVR with 21 mm Inspiris Edwards valve, mechanical MVR with 25 mm Regent valve and septal myectomy at [facility] in 09/2022), heart block s/p PPM  Warfarin last dose 02/01/2024 with Lovenox bridge.  Last Lovenox AM of 02/06/2024  Reproductive/Obstetrics                              Anesthesia Physical Anesthesia Plan  ASA: 3  Anesthesia Plan: MAC and Spinal   Post-op Pain Management:    Induction: Intravenous  PONV Risk Score and Plan: 2 and Ondansetron , Dexamethasone  and Treatment may vary due to age or medical condition  Airway Management Planned: Simple Face Mask and Nasal Cannula  Additional Equipment: None  Intra-op Plan:   Post-operative Plan:   Informed Consent: I have reviewed the patients History and Physical, chart, labs and discussed the procedure including the risks, benefits and alternatives for the proposed anesthesia with the patient or authorized representative who has indicated his/her understanding and acceptance.     Dental advisory given  Plan Discussed with: CRNA  Anesthesia Plan Comments: (See PAT note 02/02/2024)         Anesthesia Quick Evaluation

## 2024-02-07 ENCOUNTER — Inpatient Hospital Stay (HOSPITAL_COMMUNITY)
Admission: RE | Admit: 2024-02-07 | Discharge: 2024-02-13 | DRG: 498 | Disposition: A | Discharge status: Home Health | Attending: Orthopedic Surgery | Admitting: Orthopedic Surgery

## 2024-02-07 ENCOUNTER — Inpatient Hospital Stay (HOSPITAL_COMMUNITY): Payer: Self-pay | Admitting: Physician Assistant

## 2024-02-07 ENCOUNTER — Inpatient Hospital Stay (HOSPITAL_COMMUNITY)

## 2024-02-07 ENCOUNTER — Encounter (HOSPITAL_COMMUNITY)
Admission: RE | Disposition: A | Discharge status: Home Health | Payer: Self-pay | Source: Home / Self Care | Attending: Orthopedic Surgery

## 2024-02-07 ENCOUNTER — Encounter (HOSPITAL_COMMUNITY): Payer: Self-pay | Admitting: Orthopedic Surgery

## 2024-02-07 ENCOUNTER — Other Ambulatory Visit: Payer: Self-pay

## 2024-02-07 DIAGNOSIS — Z79899 Other long term (current) drug therapy: Secondary | ICD-10-CM

## 2024-02-07 DIAGNOSIS — Z888 Allergy status to other drugs, medicaments and biological substances status: Secondary | ICD-10-CM

## 2024-02-07 DIAGNOSIS — K219 Gastro-esophageal reflux disease without esophagitis: Secondary | ICD-10-CM | POA: Diagnosis present

## 2024-02-07 DIAGNOSIS — E1122 Type 2 diabetes mellitus with diabetic chronic kidney disease: Secondary | ICD-10-CM | POA: Diagnosis present

## 2024-02-07 DIAGNOSIS — E1169 Type 2 diabetes mellitus with other specified complication: Secondary | ICD-10-CM | POA: Diagnosis present

## 2024-02-07 DIAGNOSIS — G43909 Migraine, unspecified, not intractable, without status migrainosus: Secondary | ICD-10-CM | POA: Diagnosis present

## 2024-02-07 DIAGNOSIS — Z7984 Long term (current) use of oral hypoglycemic drugs: Secondary | ICD-10-CM

## 2024-02-07 DIAGNOSIS — M1611 Unilateral primary osteoarthritis, right hip: Secondary | ICD-10-CM | POA: Diagnosis present

## 2024-02-07 DIAGNOSIS — Z950 Presence of cardiac pacemaker: Secondary | ICD-10-CM

## 2024-02-07 DIAGNOSIS — I421 Obstructive hypertrophic cardiomyopathy: Secondary | ICD-10-CM | POA: Diagnosis present

## 2024-02-07 DIAGNOSIS — Z800 Family history of malignant neoplasm of digestive organs: Secondary | ICD-10-CM | POA: Diagnosis not present

## 2024-02-07 DIAGNOSIS — D62 Acute posthemorrhagic anemia: Secondary | ICD-10-CM | POA: Diagnosis not present

## 2024-02-07 DIAGNOSIS — E66812 Obesity, class 2: Secondary | ICD-10-CM | POA: Diagnosis present

## 2024-02-07 DIAGNOSIS — E7849 Other hyperlipidemia: Secondary | ICD-10-CM | POA: Diagnosis present

## 2024-02-07 DIAGNOSIS — I129 Hypertensive chronic kidney disease with stage 1 through stage 4 chronic kidney disease, or unspecified chronic kidney disease: Secondary | ICD-10-CM | POA: Diagnosis present

## 2024-02-07 DIAGNOSIS — Z87891 Personal history of nicotine dependence: Secondary | ICD-10-CM

## 2024-02-07 DIAGNOSIS — N1832 Chronic kidney disease, stage 3b: Secondary | ICD-10-CM

## 2024-02-07 DIAGNOSIS — I442 Atrioventricular block, complete: Secondary | ICD-10-CM | POA: Diagnosis not present

## 2024-02-07 DIAGNOSIS — Z952 Presence of prosthetic heart valve: Secondary | ICD-10-CM

## 2024-02-07 DIAGNOSIS — I080 Rheumatic disorders of both mitral and aortic valves: Secondary | ICD-10-CM | POA: Diagnosis present

## 2024-02-07 DIAGNOSIS — T8484XA Pain due to internal orthopedic prosthetic devices, implants and grafts, initial encounter: Secondary | ICD-10-CM | POA: Diagnosis present

## 2024-02-07 DIAGNOSIS — Y828 Other medical devices associated with adverse incidents: Secondary | ICD-10-CM | POA: Diagnosis present

## 2024-02-07 DIAGNOSIS — Z8249 Family history of ischemic heart disease and other diseases of the circulatory system: Secondary | ICD-10-CM

## 2024-02-07 DIAGNOSIS — I4821 Permanent atrial fibrillation: Secondary | ICD-10-CM | POA: Diagnosis present

## 2024-02-07 DIAGNOSIS — Z01818 Encounter for other preprocedural examination: Secondary | ICD-10-CM

## 2024-02-07 DIAGNOSIS — Z7901 Long term (current) use of anticoagulants: Secondary | ICD-10-CM | POA: Diagnosis not present

## 2024-02-07 DIAGNOSIS — Z6835 Body mass index (BMI) 35.0-35.9, adult: Secondary | ICD-10-CM | POA: Diagnosis not present

## 2024-02-07 DIAGNOSIS — S72141P Displaced intertrochanteric fracture of right femur, subsequent encounter for closed fracture with malunion: Principal | ICD-10-CM

## 2024-02-07 DIAGNOSIS — Z96651 Presence of right artificial knee joint: Secondary | ICD-10-CM | POA: Diagnosis present

## 2024-02-07 DIAGNOSIS — Z96641 Presence of right artificial hip joint: Secondary | ICD-10-CM

## 2024-02-07 DIAGNOSIS — M25551 Pain in right hip: Principal | ICD-10-CM

## 2024-02-07 DIAGNOSIS — Z803 Family history of malignant neoplasm of breast: Secondary | ICD-10-CM

## 2024-02-07 HISTORY — PX: CONVERSION TO TOTAL HIP: SHX5784

## 2024-02-07 HISTORY — PX: HARDWARE REMOVAL: SHX979

## 2024-02-07 LAB — GLUCOSE, CAPILLARY
Glucose-Capillary: 180 mg/dL — ABNORMAL HIGH (ref 70–99)
Glucose-Capillary: 113 mg/dL — ABNORMAL HIGH (ref 70–99)
Glucose-Capillary: 122 mg/dL — ABNORMAL HIGH (ref 70–99)
Glucose-Capillary: 279 mg/dL — ABNORMAL HIGH (ref 70–99)

## 2024-02-07 LAB — PROTIME-INR
INR: 1.3 — ABNORMAL HIGH (ref 0.8–1.2)
Prothrombin Time: 17.3 s — ABNORMAL HIGH (ref 11.4–15.2)

## 2024-02-07 SURGERY — CONVERSION, PREVIOUS HIP SURGERY, TO TOTAL HIP ARTHROPLASTY
Anesthesia: Monitor Anesthesia Care | Site: Hip | Laterality: Right

## 2024-02-07 MED ORDER — ACETAMINOPHEN 500 MG PO TABS
1000.0000 mg | ORAL_TABLET | Freq: Four times a day (QID) | ORAL | Status: AC
  Administered 2024-02-07 – 2024-02-11 (×15): 1000 mg via ORAL
  Filled 2024-02-07 (×16): qty 2

## 2024-02-07 MED ORDER — WARFARIN - PHARMACIST DOSING INPATIENT
Freq: Every day | Status: DC
Start: 2024-02-08 — End: 2024-02-13

## 2024-02-07 MED ORDER — MELATONIN 5 MG PO TABS
10.0000 mg | ORAL_TABLET | Freq: Every day | ORAL | Status: DC
  Administered 2024-02-07 – 2024-02-12 (×6): 10 mg via ORAL
  Filled 2024-02-07 (×6): qty 2

## 2024-02-07 MED ORDER — ONDANSETRON HCL 4 MG PO TABS: 4.0000 mg | ORAL_TABLET | Freq: Four times a day (QID) | ORAL | Status: DC | PRN

## 2024-02-07 MED ORDER — SODIUM CHLORIDE 0.9 % IV SOLN: INTRAVENOUS | Status: DC

## 2024-02-07 MED ORDER — METHOCARBAMOL 1000 MG/10ML IJ SOLN: 500.0000 mg | Freq: Four times a day (QID) | INTRAMUSCULAR | Status: DC | PRN

## 2024-02-07 MED ORDER — ONDANSETRON HCL 4 MG/2ML IJ SOLN
INTRAMUSCULAR | Status: AC
  Filled 2024-02-07: qty 2

## 2024-02-07 MED ORDER — PROPOFOL 1000 MG/100ML IV EMUL
INTRAVENOUS | Status: AC
  Filled 2024-02-07: qty 100

## 2024-02-07 MED ORDER — FENTANYL CITRATE (PF) 50 MCG/ML IJ SOSY
25.0000 ug | PREFILLED_SYRINGE | INTRAMUSCULAR | Status: DC | PRN
  Administered 2024-02-07 (×2): 50 ug via INTRAVENOUS

## 2024-02-07 MED ORDER — CEFAZOLIN SODIUM-DEXTROSE 2-4 GM/100ML-% IV SOLN
2.0000 g | Freq: Four times a day (QID) | INTRAVENOUS | Status: AC
  Administered 2024-02-07 – 2024-02-08 (×2): 2 g via INTRAVENOUS
  Filled 2024-02-07 (×2): qty 100

## 2024-02-07 MED ORDER — LIDOCAINE HCL (CARDIAC) PF 100 MG/5ML IV SOSY
PREFILLED_SYRINGE | INTRAVENOUS | Status: DC | PRN
  Administered 2024-02-07: 30 mg via INTRAVENOUS

## 2024-02-07 MED ORDER — ACETAMINOPHEN 10 MG/ML IV SOLN: 1000.0000 mg | Freq: Once | INTRAVENOUS | Status: DC | PRN

## 2024-02-07 MED ORDER — FENTANYL CITRATE (PF) 100 MCG/2ML IJ SOLN
INTRAMUSCULAR | Status: AC
  Filled 2024-02-07: qty 2

## 2024-02-07 MED ORDER — DIPHENHYDRAMINE HCL 12.5 MG/5ML PO ELIX: 12.5000 mg | ORAL_SOLUTION | ORAL | Status: DC | PRN

## 2024-02-07 MED ORDER — LIDOCAINE HCL (PF) 2 % IJ SOLN
INTRAMUSCULAR | Status: AC
  Filled 2024-02-07: qty 5

## 2024-02-07 MED ORDER — ALBUMIN HUMAN 5 % IV SOLN
INTRAVENOUS | Status: AC
  Filled 2024-02-07: qty 250

## 2024-02-07 MED ORDER — LACTATED RINGERS IV SOLN: INTRAVENOUS | Status: DC

## 2024-02-07 MED ORDER — ENOXAPARIN SODIUM 100 MG/ML IJ SOSY
100.0000 mg | PREFILLED_SYRINGE | Freq: Two times a day (BID) | INTRAMUSCULAR | Status: DC
  Administered 2024-02-08 – 2024-02-12 (×9): 100 mg via SUBCUTANEOUS
  Filled 2024-02-07 (×10): qty 1

## 2024-02-07 MED ORDER — METOCLOPRAMIDE HCL 5 MG PO TABS: 5.0000 mg | ORAL_TABLET | Freq: Three times a day (TID) | ORAL | Status: DC | PRN

## 2024-02-07 MED ORDER — DEXAMETHASONE SOD PHOSPHATE PF 10 MG/ML IJ SOLN
10.0000 mg | Freq: Once | INTRAMUSCULAR | Status: AC
  Administered 2024-02-08: 10 mg via INTRAVENOUS

## 2024-02-07 MED ORDER — OXYCODONE HCL 5 MG PO TABS
10.0000 mg | ORAL_TABLET | ORAL | Status: DC | PRN
  Administered 2024-02-08 – 2024-02-11 (×7): 10 mg via ORAL
  Filled 2024-02-07 (×4): qty 2

## 2024-02-07 MED ORDER — ONDANSETRON HCL 4 MG/2ML IJ SOLN
INTRAMUSCULAR | Status: DC | PRN
  Administered 2024-02-07: 4 mg via INTRAVENOUS

## 2024-02-07 MED ORDER — ALBUMIN HUMAN 5 % IV SOLN: INTRAVENOUS | Status: DC | PRN

## 2024-02-07 MED ORDER — LOSARTAN POTASSIUM 50 MG PO TABS
50.0000 mg | ORAL_TABLET | Freq: Every day | ORAL | Status: DC
  Administered 2024-02-07 – 2024-02-13 (×7): 50 mg via ORAL
  Filled 2024-02-07 (×8): qty 1

## 2024-02-07 MED ORDER — DEXAMETHASONE SOD PHOSPHATE PF 10 MG/ML IJ SOLN
8.0000 mg | Freq: Once | INTRAMUSCULAR | Status: AC
  Administered 2024-02-07: 4 mg via INTRAVENOUS

## 2024-02-07 MED ORDER — SENNA 8.6 MG PO TABS
2.0000 | ORAL_TABLET | Freq: Every day | ORAL | Status: DC
  Administered 2024-02-07 – 2024-02-09 (×3): 17.2 mg via ORAL
  Administered 2024-02-12: 8.1 mg via ORAL
  Filled 2024-02-07 (×6): qty 2

## 2024-02-07 MED ORDER — PHENYLEPHRINE HCL-NACL 20-0.9 MG/250ML-% IV SOLN
INTRAVENOUS | Status: DC | PRN
  Administered 2024-02-07: 20 ug/min via INTRAVENOUS

## 2024-02-07 MED ORDER — PHENOL 1.4 % MT LIQD: 1.0000 | OROMUCOSAL | Status: DC | PRN

## 2024-02-07 MED ORDER — ACETAMINOPHEN 500 MG PO TABS
ORAL_TABLET | ORAL | Status: AC
  Filled 2024-02-07: qty 2

## 2024-02-07 MED ORDER — INSULIN ASPART 100 UNIT/ML IJ SOLN
0.0000 [IU] | INTRAMUSCULAR | Status: DC | PRN
  Administered 2024-02-07: 2 [IU] via SUBCUTANEOUS
  Filled 2024-02-07: qty 1

## 2024-02-07 MED ORDER — POVIDONE-IODINE 10 % EX SWAB: 2.0000 | Freq: Once | CUTANEOUS | Status: DC

## 2024-02-07 MED ORDER — ORAL CARE MOUTH RINSE: 15.0000 mL | Freq: Once | OROMUCOSAL | Status: DC

## 2024-02-07 MED ORDER — TRANEXAMIC ACID-NACL 1000-0.7 MG/100ML-% IV SOLN
1000.0000 mg | INTRAVENOUS | Status: AC
  Administered 2024-02-07: 1000 mg via INTRAVENOUS
  Filled 2024-02-07: qty 100

## 2024-02-07 MED ORDER — METFORMIN HCL 500 MG PO TABS
1000.0000 mg | ORAL_TABLET | Freq: Two times a day (BID) | ORAL | Status: DC
  Administered 2024-02-08 – 2024-02-13 (×11): 1000 mg via ORAL
  Filled 2024-02-07 (×11): qty 2

## 2024-02-07 MED ORDER — METOCLOPRAMIDE HCL 5 MG/ML IJ SOLN: 5.0000 mg | Freq: Three times a day (TID) | INTRAMUSCULAR | Status: DC | PRN

## 2024-02-07 MED ORDER — CHLORHEXIDINE GLUCONATE 0.12 % MT SOLN: 15.0000 mL | Freq: Once | OROMUCOSAL | Status: DC

## 2024-02-07 MED ORDER — EZETIMIBE 10 MG PO TABS
10.0000 mg | ORAL_TABLET | Freq: Every morning | ORAL | Status: DC
  Administered 2024-02-08 – 2024-02-13 (×6): 10 mg via ORAL
  Filled 2024-02-07 (×6): qty 1

## 2024-02-07 MED ORDER — INSULIN ASPART 100 UNIT/ML IJ SOLN
0.0000 [IU] | Freq: Three times a day (TID) | INTRAMUSCULAR | Status: DC
  Administered 2024-02-08: 3 [IU] via SUBCUTANEOUS
  Administered 2024-02-08 (×2): 8 [IU] via SUBCUTANEOUS
  Administered 2024-02-09: 2 [IU] via SUBCUTANEOUS
  Administered 2024-02-09: 3 [IU] via SUBCUTANEOUS
  Administered 2024-02-09: 2 [IU] via SUBCUTANEOUS
  Administered 2024-02-10 (×3): 3 [IU] via SUBCUTANEOUS
  Administered 2024-02-11: 5 [IU] via SUBCUTANEOUS
  Administered 2024-02-11 – 2024-02-13 (×5): 3 [IU] via SUBCUTANEOUS
  Administered 2024-02-13: 2 [IU] via SUBCUTANEOUS

## 2024-02-07 MED ORDER — 0.9 % SODIUM CHLORIDE (POUR BTL) OPTIME
TOPICAL | Status: DC | PRN
  Administered 2024-02-07 (×2): 1000 mL

## 2024-02-07 MED ORDER — INSULIN ASPART 100 UNIT/ML IJ SOLN
0.0000 [IU] | Freq: Every day | INTRAMUSCULAR | Status: DC
  Administered 2024-02-07: 3 [IU] via SUBCUTANEOUS
  Administered 2024-02-08 – 2024-02-10 (×2): 2 [IU] via SUBCUTANEOUS

## 2024-02-07 MED ORDER — HYDROMORPHONE HCL 1 MG/ML IJ SOLN
0.5000 mg | INTRAMUSCULAR | Status: DC | PRN
  Administered 2024-02-08 (×2): 0.5 mg via INTRAVENOUS
  Filled 2024-02-07 (×2): qty 1

## 2024-02-07 MED ORDER — TRANEXAMIC ACID-NACL 1000-0.7 MG/100ML-% IV SOLN
1000.0000 mg | Freq: Once | INTRAVENOUS | Status: AC
  Administered 2024-02-07: 1000 mg via INTRAVENOUS
  Filled 2024-02-07: qty 100

## 2024-02-07 MED ORDER — PANTOPRAZOLE SODIUM 40 MG PO TBEC
40.0000 mg | DELAYED_RELEASE_TABLET | Freq: Every day | ORAL | Status: DC
  Administered 2024-02-08 – 2024-02-13 (×6): 40 mg via ORAL
  Filled 2024-02-07 (×6): qty 1

## 2024-02-07 MED ORDER — POLYETHYLENE GLYCOL 3350 17 G PO PACK
17.0000 g | PACK | Freq: Two times a day (BID) | ORAL | Status: DC
  Administered 2024-02-08 – 2024-02-13 (×5): 17 g via ORAL
  Filled 2024-02-07 (×9): qty 1

## 2024-02-07 MED ORDER — BISACODYL 10 MG RE SUPP: 10.0000 mg | Freq: Every day | RECTAL | Status: DC | PRN

## 2024-02-07 MED ORDER — AMIODARONE HCL 200 MG PO TABS
200.0000 mg | ORAL_TABLET | Freq: Every morning | ORAL | Status: DC
  Administered 2024-02-08 – 2024-02-13 (×6): 200 mg via ORAL
  Filled 2024-02-07 (×6): qty 1

## 2024-02-07 MED ORDER — ONDANSETRON HCL 4 MG/2ML IJ SOLN: 4.0000 mg | Freq: Four times a day (QID) | INTRAMUSCULAR | Status: DC | PRN

## 2024-02-07 MED ORDER — DEXMEDETOMIDINE HCL IN NACL 80 MCG/20ML IV SOLN
INTRAVENOUS | Status: AC
  Filled 2024-02-07: qty 20

## 2024-02-07 MED ORDER — CEFAZOLIN SODIUM-DEXTROSE 2-4 GM/100ML-% IV SOLN
2.0000 g | INTRAVENOUS | Status: AC
Start: 2024-02-08 — End: 2024-02-08
  Administered 2024-02-07: 2 g via INTRAVENOUS
  Filled 2024-02-07: qty 100

## 2024-02-07 MED ORDER — BUPIVACAINE HCL (PF) 0.5 % IJ SOLN
INTRAMUSCULAR | Status: DC | PRN
  Administered 2024-02-07: 3 mL

## 2024-02-07 MED ORDER — PROPOFOL 10 MG/ML IV BOLUS
INTRAVENOUS | Status: DC | PRN
  Administered 2024-02-07: 40 ug/kg/min via INTRAVENOUS

## 2024-02-07 MED ORDER — OXYCODONE HCL 5 MG PO TABS
5.0000 mg | ORAL_TABLET | ORAL | Status: DC | PRN
  Administered 2024-02-07: 5 mg via ORAL
  Administered 2024-02-08 – 2024-02-12 (×6): 10 mg via ORAL
  Filled 2024-02-07 (×6): qty 2
  Filled 2024-02-07: qty 1
  Filled 2024-02-07 (×4): qty 2

## 2024-02-07 MED ORDER — METOPROLOL SUCCINATE ER 25 MG PO TB24
25.0000 mg | ORAL_TABLET | Freq: Two times a day (BID) | ORAL | Status: DC
  Administered 2024-02-07 – 2024-02-13 (×12): 25 mg via ORAL
  Filled 2024-02-07 (×14): qty 1

## 2024-02-07 MED ORDER — ALUM & MAG HYDROXIDE-SIMETH 200-200-20 MG/5ML PO SUSP: 30.0000 mL | ORAL | Status: DC | PRN

## 2024-02-07 MED ORDER — METHOCARBAMOL 500 MG PO TABS
500.0000 mg | ORAL_TABLET | Freq: Four times a day (QID) | ORAL | Status: DC | PRN
  Administered 2024-02-07 – 2024-02-13 (×10): 500 mg via ORAL
  Filled 2024-02-07 (×10): qty 1

## 2024-02-07 MED ORDER — NITROFURANTOIN MACROCRYSTAL 50 MG PO CAPS
50.0000 mg | ORAL_CAPSULE | Freq: Every morning | ORAL | Status: DC
  Administered 2024-02-08 – 2024-02-13 (×6): 50 mg via ORAL
  Filled 2024-02-07 (×6): qty 1

## 2024-02-07 MED ORDER — FENTANYL CITRATE (PF) 50 MCG/ML IJ SOSY
PREFILLED_SYRINGE | INTRAMUSCULAR | Status: AC
  Filled 2024-02-07: qty 3

## 2024-02-07 MED ORDER — DEXMEDETOMIDINE HCL IN NACL 200 MCG/50ML IV SOLN
INTRAVENOUS | Status: DC | PRN
  Administered 2024-02-07: 8 ug via INTRAVENOUS

## 2024-02-07 MED ORDER — MENTHOL 3 MG MT LOZG: 1.0000 | LOZENGE | OROMUCOSAL | Status: DC | PRN

## 2024-02-07 MED ORDER — WARFARIN SODIUM 2.5 MG PO TABS
2.5000 mg | ORAL_TABLET | Freq: Once | ORAL | Status: AC
  Administered 2024-02-07: 2.5 mg via ORAL
  Filled 2024-02-07 (×2): qty 1

## 2024-02-07 MED ORDER — FENTANYL CITRATE (PF) 100 MCG/2ML IJ SOLN
INTRAMUSCULAR | Status: DC | PRN
  Administered 2024-02-07: 50 ug via INTRAVENOUS
  Administered 2024-02-07 (×2): 25 ug via INTRAVENOUS

## 2024-02-07 SURGICAL SUPPLY — 50 items
ALTRX PINNACLE 10DEG 36X58 (Bone Implant) IMPLANT
BAG COUNTER SPONGE SURGICOUNT (BAG) IMPLANT
BLADE SAW SGTL 18X1.27X75 (BLADE) ×1 IMPLANT
COVER SURGICAL LIGHT HANDLE (MISCELLANEOUS) ×1 IMPLANT
CUP ACET PINNACLE SECTR 58MM (Hips) IMPLANT
DERMABOND ADVANCED .7 DNX12 (GAUZE/BANDAGES/DRESSINGS) ×1 IMPLANT
DRAPE INCISE IOBAN 85X60 (DRAPES) ×1 IMPLANT
DRAPE POUCH INSTRU U-SHP 10X18 (DRAPES) ×1 IMPLANT
DRAPE SHEET LG 3/4 BI-LAMINATE (DRAPES) IMPLANT
DRAPE SURG ORHT 6 SPLT 77X108 (DRAPES) ×2 IMPLANT
DRAPE U-SHAPE 47X51 STRL (DRAPES) ×2 IMPLANT
DRSG AQUACEL AG ADV 3.5X 6 (GAUZE/BANDAGES/DRESSINGS) IMPLANT
DRSG AQUACEL AG ADV 3.5X10 (GAUZE/BANDAGES/DRESSINGS) IMPLANT
DRSG AQUACEL AG ADV 3.5X14 (GAUZE/BANDAGES/DRESSINGS) IMPLANT
DURAPREP 26ML APPLICATOR (WOUND CARE) ×1 IMPLANT
ELECT BLADE TIP CTD 4 INCH (ELECTRODE) ×1 IMPLANT
ELECT REM PT RETURN 15FT ADLT (MISCELLANEOUS) ×1 IMPLANT
FACESHIELD WRAPAROUND OR TEAM (MASK) IMPLANT
GLOVE BIO SURGEON STRL SZ 6 (GLOVE) ×1 IMPLANT
GLOVE BIO SURGEON STRL SZ7.5 (GLOVE) ×1 IMPLANT
GLOVE BIOGEL PI IND STRL 6.5 (GLOVE) ×1 IMPLANT
GLOVE BIOGEL PI IND STRL 7.5 (GLOVE) ×3 IMPLANT
GLOVE INDICATOR 6.5 STRL GRN (GLOVE) ×1 IMPLANT
GLOVE ORTHO TXT STRL SZ7.5 (GLOVE) ×2 IMPLANT
GOWN STRL REUS W/ TWL LRG LVL3 (GOWN DISPOSABLE) ×2 IMPLANT
HEAD CERAMIC 36 PLUS 8.5 12 14 (Hips) IMPLANT
KIT BASIN OR (CUSTOM PROCEDURE TRAY) ×1 IMPLANT
KIT TURNOVER KIT A (KITS) ×1 IMPLANT
MANIFOLD NEPTUNE II (INSTRUMENTS) ×1 IMPLANT
NAIL EXTRACTOR DISP (INSTRUMENTS) IMPLANT
NS IRRIG 1000ML POUR BTL (IV SOLUTION) ×1 IMPLANT
PACK TOTAL JOINT (CUSTOM PROCEDURE TRAY) ×1 IMPLANT
PACK TOTAL KNEE CUSTOM (KITS) ×1 IMPLANT
PENCIL SMOKE EVACUATOR (MISCELLANEOUS) ×1 IMPLANT
PROTECTOR NERVE ULNAR (MISCELLANEOUS) ×1 IMPLANT
SCREW 6.5MMX30MM (Screw) IMPLANT
SCREW 6.5MMX35MM (Screw) IMPLANT
STAPLER SKIN PROX 35W (STAPLE) IMPLANT
STEM FEM REV CMTLS RECL 17 HO (Stem) IMPLANT
SUCTION TUBE FRAZIER 10FR DISP (SUCTIONS) ×1 IMPLANT
SUCTION TUBE FRAZIER 12FR DISP (SUCTIONS) ×1 IMPLANT
SUT MNCRL AB 4-0 PS2 18 (SUTURE) ×1 IMPLANT
SUT STRATAFIX PDS+ 0 24IN (SUTURE) ×1 IMPLANT
SUT VIC AB 1 CT1 36 (SUTURE) ×3 IMPLANT
SUT VIC AB 2-0 CT1 TAPERPNT 27 (SUTURE) ×3 IMPLANT
SUTURE STRATFX 0 PDS 27 VIOLET (SUTURE) ×1 IMPLANT
TOWEL OR 17X26 10 PK STRL BLUE (TOWEL DISPOSABLE) ×2 IMPLANT
TRAY FOLEY MTR SLVR 14FR STAT (SET/KITS/TRAYS/PACK) IMPLANT
TUBE SUCTION HIGH CAP CLEAR NV (SUCTIONS) ×1 IMPLANT
WATER STERILE IRR 1000ML POUR (IV SOLUTION) ×2 IMPLANT

## 2024-02-07 NOTE — Transfer of Care (Signed)
 Immediate Anesthesia Transfer of Care Note  Patient: [name]  Procedure(s) Performed: CONVERSION, PREVIOUS HIP SURGERY, TO TOTAL HIP ARTHROPLASTY (Right: Hip) REMOVAL, HARDWARE (Right: Hip)  Patient Location: PACU  Anesthesia Type:MAC and Spinal  Level of Consciousness: awake and alert   Airway & Oxygen Therapy: Patient Spontanous Breathing and Patient connected to face mask oxygen  Post-op Assessment: Report given to RN and Post -op Vital signs reviewed and stable  Post vital signs: Reviewed and stable  Last Vitals:  Vitals Value Taken Time  BP 107/52 02/07/24 17:52  Temp    Pulse 58 02/07/24 17:56  Resp 26 02/07/24 17:56  SpO2 100 % 02/07/24 17:56  Vitals shown include unfiled device data.  Last Pain:  Vitals:   02/07/24 1336  TempSrc:   PainSc: 3          Complications: No notable events documented.

## 2024-02-07 NOTE — Anesthesia Procedure Notes (Signed)
 Spinal  Patient location during procedure: OR Start time: 02/07/2024 3:05 PM End time: 02/07/2024 3:09 PM Staffing Performed: anesthesiologist  Anesthesiologist: Dorethea Cordella SQUIBB, DO Performed by: Dorethea Cordella SQUIBB, DO Authorized by: Dorethea Cordella SQUIBB, DO   Preanesthetic Checklist Completed: patient identified, IV checked, site marked, risks and benefits discussed, surgical consent, monitors and equipment checked, pre-op evaluation and timeout performed Spinal Block Patient position: sitting Prep: DuraPrep Patient monitoring: heart rate, cardiac monitor, continuous pulse ox and blood pressure Approach: midline Location: L3-4 Injection technique: single-shot Needle Needle type: Pencan  Needle gauge: 24 G Needle length: 10 cm Assessment Events: CSF return Additional Notes Patient identified. Risks/Benefits/Options discussed with patient including but not limited to bleeding, infection, nerve damage, paralysis, failed block, incomplete pain control, headache, blood pressure changes, nausea, vomiting, reactions to medications, itching and postpartum back pain. Confirmed with bedside nurse the patient's most recent platelet count. Confirmed with patient that they are not currently taking any anticoagulation, have any bleeding history or any family history of bleeding disorders. Patient expressed understanding and wished to proceed. All questions were answered. Sterile technique was used throughout the entire procedure. Please see nursing notes for vital signs. Warning signs of high block given to the patient including shortness of breath, tingling/numbness in hands, complete motor block, or any concerning symptoms with instructions to call for help. Patient was given instructions on fall risk and not to get out of bed. All questions and concerns addressed with instructions to call with any issues or inadequate analgesia.

## 2024-02-07 NOTE — Progress Notes (Signed)
 PHARMACY - ANTICOAGULATION CONSULT NOTE  Pharmacy Consult for warfarin Indication: mech MVR, valvular Afib  Allergies  Allergen Reactions   Nystatin  Other (See Comments)    unknown   Other Other (See Comments)    Product Containing 3-Hydroxy-3-Methylglutaryl-Coenzyme A Reductase Inhibitor (Product) unknown   Statins Other (See Comments)    Muscles aches,hurt    Patient Measurements: Weight: 102.1 kg (225 lb)  Vital Signs: Temp: 98.1 F (36.7 C) (10/14 1755) Temp Source: Oral (10/14 1206) BP: 143/58 (10/14 1830) Pulse Rate: 58 (10/14 1830)  Labs: Recent Labs    02/07/24 1402  LABPROT 17.3*  INR 1.3*    Estimated Creatinine Clearance: 48.6 mL/min (A) (by C-G formula based on SCr of 1.25 mg/dL (H)).   Medical History: Past Medical History:  Diagnosis Date   Aortic stenosis    21 mm Inspiris Edwards AVR June 2024 - [facility]   Arthritis    Cervical incompetence    Dyspnea    GERD (gastroesophageal reflux disease)    Hay fever    Heart block    [employer] DCPPM on 10/26/2022 with Dr. Saturnino - [facility]   Hemochromatosis    HOCM (hypertrophic obstructive cardiomyopathy) (HCC)    Septal myomectomy June 2024 - [facility]   Hypertension    Migraines    Mitral stenosis    25 mm Regent mechanical MVR June 2024 - [facility]   Paroxysmal atrial fibrillation (HCC)    Presence of permanent cardiac pacemaker    Sciatica    Type 2 diabetes mellitus (HCC)     Medications:  Medications Prior to Admission  Medication Sig Dispense Refill Last Dose/Taking   acetaminophen  (TYLENOL ) 325 MG tablet Take 2 tablets (650 mg total) by mouth every 6 (six) hours as needed for mild pain (pain score 1-3) (or Fever >/= 101).   Past Week   amiodarone  (PACERONE ) 200 MG tablet TAKE 1 TABLET BY MOUTH TWICE DAILY X3 WEEKS THEN 1 TABLET EVERY DAY THERAFTER. (Patient taking differently: Take 200 mg by mouth in the morning.) 90 tablet 1 02/07/2024   enoxaparin (LOVENOX) 100 MG/ML injection Inject 1 mL (100  mg total) into the skin every 12 (twelve) hours. 10 mL 1 02/06/2024 at  9:30 AM   ezetimibe  (ZETIA ) 10 MG tablet Take 10 mg by mouth every morning.   02/07/2024   losartan  (COZAAR ) 50 MG tablet Take 1 tablet (50 mg total) by mouth daily. 90 tablet 3 02/06/2024   magnesium  oxide (MAG-OX) 400 (240 Mg) MG tablet Take 400 mg by mouth in the morning.   Past Week   MELATONIN GUMMIES PO Take 10 mg by mouth at bedtime.   Past Week   metFORMIN  (GLUCOPHAGE ) 1000 MG tablet Take 1,000 mg by mouth 2 (two) times daily.   02/06/2024   metoprolol  succinate (TOPROL -XL) 25 MG 24 hr tablet Take 1 tablet (25 mg total) by mouth 2 (two) times daily. 180 tablet 3 02/07/2024   nitrofurantoin  (MACRODANTIN ) 50 MG capsule Take 50 mg by mouth in the morning.   02/06/2024   omeprazole (PRILOSEC) 20 MG capsule Take 20 mg by mouth daily before breakfast.  3 02/07/2024   warfarin (COUMADIN ) 2.5 MG tablet TAKE 1 TABLET BY MOUTH DAILY. EXCEPT 1/2 TABLET ON MONDAYS AND THURSDAYS OR AS DIRECTED 90 tablet 1 02/01/2024   Scheduled:   chlorhexidine   15 mL Mouth/Throat Once   Or   mouth rinse  15 mL Mouth Rinse Once   povidone-iodine   2 Application Topical Once   PRN:  acetaminophen , fentaNYL  (SUBLIMAZE ) injection, insulin  aspart, methocarbamol  **OR** methocarbamol  (ROBAXIN ) injection   Assessment: 77 yoF with PMH HOCM and aortic stenosis s/p bioprosthetic AVR, mechanical MVR on warfarin with INR goal 2.5-3.5; Afib, heart block s/p PPM, HTN, HLD, DM2, admitted 10/14 for total hip arthroplasty. Bridged preoperatively per her Coumadin  clinic with therapeutic Lovenox following schedule below. Pharmacy to coordinate anticoagulation postoperatively through to discharge.   Bridging instructions per Coumadin  Clinic: 10/8  Last dose of warfarin 10/9  No Lovenox or warfarin 10/10 - 10/12  Lovenox 100mg  sq at 7am & 7pm 10/13  Lovenox 100mg  sq at 7am ----------No Lovenox in PM 10/14  No Lovenox in AM --------surgery-------admit to hospital.   Follow hospital instructions at discharge.  Noted procedure was completed late in the day (~18:00), and per Ortho PA, EBL ~1100 ml (significant). Will plan to start warfarin 10/14 PM and resume therapeutic Lovenox 10/15 AM  Baseline INR subtherapeutic as expected after holding warfarin for nearly a week Prior anticoagulation: warfarin 1.25 mg on Mon & Thurs; 2.5 mg all other days Chronically on amiodarone  200 mg daily (continued perioperatively)  Significant events:  Today, 02/07/2024: CBC: Hgb slightly low on 10/9; Plt WNL; no pre-op CBC INR subtherapeutic preoperatively Major drug interactions: amiodarone  (chronic) No bleeding issues per nursing Operative EBL ~1100 ml per PA Regular diet ordered; meal intake not charted  Goal of Therapy: INR 2.5 - 3.5  Plan: Warfarin 2.5 mg PO tonight x 1 Lovenox 100 mg SQ bid to start tomorrow AM Daily INR CBC at least q72 hr while on warfarin Monitor for signs of bleeding or thrombosis   Bard Jeans, PharmD, BCPS [phone] 02/07/2024, 6:51 PM

## 2024-02-07 NOTE — H&P (Signed)
 TOTAL HIP ADMISSION H&P  Patient is admitted for conversion to right total hip arthroplasty.  Therapy Plans: HEP Disposition: Home with husband Planned DVT Prophylaxis: Coumadin  DME needed: none PCP: Dr. Marvine - appointment on Tuesday Cardio: Dr. Delford - clearance received TXA: IV Allergies: statins Anesthesia Concerns: none BMI: 34.3 Last HgbA1c: Not diabetic   Other: - hx of mechanical heart valve on coumadin  -- will lovenox bridge - has pacemaker, a fib stable since last DCCV - oxycodone , robaxin , tylenol   Subjective:  Chief Complaint: right hip pain  HPI: [name], 73 y.o. female. She has a history of right proximal femur fracture treated with IM nail by Dr. Kendal in 05/2023.  During the first year post op, she was found to have protrusion of the lag screw through the femoral head causing failure of the implant. She was sent to Dr. Ernie to discuss hardware removal and total hip arthroplasty.   Patient Active Problem List   Diagnosis Date Noted   Closed right femoral fracture (HCC) 06/11/2023   Closed right hip fracture (HCC) 06/11/2023   Permanent atrial fibrillation (HCC) 06/11/2023   Type 2 diabetes mellitus with hyperlipidemia (HCC) 06/11/2023   Obesity, class 2 06/11/2023   CKD stage 3b, GFR 30-44 ml/min (HCC) 06/11/2023   Atrial flutter with rapid ventricular response (HCC) 01/31/2023   Atrial fibrillation with RVR (HCC) 01/31/2023   Mitral valve replaced 11/03/2022   S/P AVR (aortic valve replacement) 11/03/2022   Encounter for therapeutic drug monitoring 11/03/2022   S/P total knee arthroplasty, right 10/13/2021   Hypertrophic cardiomyopathy (HCC) 06/07/2021   Other hemochromatosis 01/30/2021   Severe aortic stenosis 07/28/2020   Moderate aortic stenosis 07/28/2020   BMI 34.0-34.9,adult 06/06/2019   Incomplete uterovaginal prolapse 11/10/2017   Essential hypertension 02/16/2016   Sciatic leg pain 02/16/2016   Past Medical History:  Diagnosis  Date   Aortic stenosis    21 mm Inspiris Edwards AVR June 2024 - [facility]   Arthritis    Cervical incompetence    Dyspnea    GERD (gastroesophageal reflux disease)    Hay fever    Heart block    [employer] DCPPM on 10/26/2022 with Dr. Saturnino - [facility]   Hemochromatosis    HOCM (hypertrophic obstructive cardiomyopathy) (HCC)    Septal myomectomy June 2024 - [facility]   Hypertension    Migraines    Mitral stenosis    25 mm [employer] mechanical MVR June 2024 - [facility]   Paroxysmal atrial fibrillation (HCC)    Presence of permanent cardiac pacemaker    Sciatica    Type 2 diabetes mellitus (HCC)     Past Surgical History:  Procedure Laterality Date   CARDIAC SURGERY     CARDIOVERSION N/A 02/02/2023   Procedure: CARDIOVERSION;  Surgeon: Stacia Diannah SQUIBB, MD;  Location: [facility];  Service: Cardiovascular;  Laterality: N/A;   CARPAL TUNNEL RELEASE Right    COLONOSCOPY N/A 08/01/2014   Procedure: COLONOSCOPY;  Surgeon: Claudis RAYMOND Rivet, MD;  Location: [facility];  Service: Endoscopy;  Laterality: N/A;  900 -- moved to 10:00 - Ann notified pt   ELBOW SURGERY     INTRAMEDULLARY (IM) NAIL INTERTROCHANTERIC Right 06/12/2023   Procedure: INTRAMEDULLARY (IM) NAIL INTERTROCHANTERIC;  Surgeon: Kendal Franky SQUIBB, MD;  Location: [facility] OR;  Service: Orthopedics;  Laterality: Right;   PACEMAKER INSERTION     June 2024   RIGHT/LEFT HEART CATH AND CORONARY ANGIOGRAPHY N/A 07/28/2020   Procedure: RIGHT/LEFT HEART CATH AND CORONARY ANGIOGRAPHY;  Surgeon: Wonda Sharper, MD;  [facility] CV [facility];  Service: Cardiovascular;  Laterality: N/A;   ROTATOR CUFF REPAIR Right 2012   TEE WITHOUT CARDIOVERSION N/A 02/02/2023   Procedure: TRANSESOPHAGEAL ECHOCARDIOGRAM (TEE);  Surgeon: Mallipeddi, Vishnu P, MD;  Location: [facility];  Service: Cardiovascular;  Laterality: N/A;   TONSILLECTOMY     TOTAL KNEE ARTHROPLASTY Right 10/13/2021   Procedure: TOTAL KNEE ARTHROPLASTY;  Surgeon: Ernie Cough, MD;  Location: [facility];   Service: Orthopedics;  Laterality: Right;   TUBAL LIGATION     WISDOM TOOTH EXTRACTION      No current facility-administered medications for this encounter.   Current Outpatient Medications  Medication Sig Dispense Refill Last Dose/Taking   amiodarone  (PACERONE ) 200 MG tablet TAKE 1 TABLET BY MOUTH TWICE DAILY X3 WEEKS THEN 1 TABLET EVERY DAY THERAFTER. (Patient taking differently: Take 200 mg by mouth in the morning.) 90 tablet 1 Taking Differently   ezetimibe  (ZETIA ) 10 MG tablet Take 10 mg by mouth every morning.   Taking   losartan  (COZAAR ) 50 MG tablet Take 1 tablet (50 mg total) by mouth daily. 90 tablet 3 Taking   magnesium  oxide (MAG-OX) 400 (240 Mg) MG tablet Take 400 mg by mouth in the morning.   Taking   MELATONIN GUMMIES PO Take 10 mg by mouth at bedtime.   Taking   metFORMIN  (GLUCOPHAGE ) 1000 MG tablet Take 1,000 mg by mouth 2 (two) times daily.   Taking   metoprolol  succinate (TOPROL -XL) 25 MG 24 hr tablet Take 1 tablet (25 mg total) by mouth 2 (two) times daily. 180 tablet 3 Taking   nitrofurantoin  (MACRODANTIN ) 50 MG capsule Take 50 mg by mouth in the morning.   Taking   omeprazole (PRILOSEC) 20 MG capsule Take 20 mg by mouth daily before breakfast.  3 Taking   warfarin (COUMADIN ) 2.5 MG tablet TAKE 1 TABLET BY MOUTH DAILY. EXCEPT 1/2 TABLET ON MONDAYS AND THURSDAYS OR AS DIRECTED 90 tablet 1 Taking   acetaminophen  (TYLENOL ) 325 MG tablet Take 2 tablets (650 mg total) by mouth every 6 (six) hours as needed for mild pain (pain score 1-3) (or Fever >/= 101).      enoxaparin (LOVENOX) 100 MG/ML injection Inject 1 mL (100 mg total) into the skin every 12 (twelve) hours. (Patient not taking: Reported on 01/25/2024) 10 mL 1    Allergies  Allergen Reactions   Nystatin  Other (See Comments)    unknown   Other Other (See Comments)    Product Containing 3-Hydroxy-3-Methylglutaryl-Coenzyme A Reductase Inhibitor (Product) unknown   Statins Other (See Comments)    Muscles aches,hurt     Social History   Tobacco Use   Smoking status: Former    Current packs/day: 0.00    Average packs/day: 0.1 packs/day for 7.0 years (0.7 ttl pk-yrs)    Types: Cigarettes    Start date: 09/16/1962    Quit date: 09/15/1969    Years since quitting: 54.4   Smokeless tobacco: Never   Tobacco comments:    as a teenager  Substance Use Topics   Alcohol use: Not Currently    Family History  Problem Relation Age of Onset   Heart attack Paternal Grandfather    Migraines Maternal Grandmother    Heart attack Maternal Grandfather    Heart attack Father    Heart murmur Father    Cancer Mother        pancreatic cancer   Heart murmur Brother    Breast cancer Maternal Aunt  Review of Systems  Constitutional:  Negative for chills and fever.  Respiratory:  Negative for cough and shortness of breath.   Cardiovascular:  Negative for chest pain.  Gastrointestinal:  Negative for nausea and vomiting.  Musculoskeletal:  Positive for arthralgias.     Objective:  Physical Exam Right hip exam: Painful and limited hip flexion internal rotation to 10 degrees, external rotation of 20 degrees Weakness with hip flexion and abduction with 5 -/5 strength No significant lower extremity edema, erythema or calf tenderness  Vital signs in last 24 hours:    Labs:   Estimated body mass index is 34.21 kg/m as calculated from the following:   Height as of 02/02/24: 5' 8 (1.727 m).   Weight as of 02/02/24: 102.1 kg.   Imaging Review  Imaging: AP and lateral of the right hip and femur reveal that she has a long trochanteric femoral nail. Her proximal femur fracture appears to be healed confirmed by CT scan. I did review with her the lag screw penetration through the superior medial femoral head. There is evidence of advanced degenerative changes involving this right hip as well.     Assessment/Plan:  End stage arthritis, right hip(s)  The patient history, physical examination, clinical  judgement of the provider and imaging studies are consistent with end stage degenerative joint disease of the right hip(s) and total hip arthroplasty is deemed medically necessary. The treatment options including medical management, injection therapy, arthroscopy and arthroplasty were discussed at length. The risks and benefits of total hip arthroplasty were presented and reviewed. The risks due to aseptic loosening, infection, stiffness, dislocation/subluxation,  thromboembolic complications and other imponderables were discussed.  The patient acknowledged the explanation, agreed to proceed with the plan and consent was signed. Patient is being admitted for inpatient treatment for surgery, pain control, PT, OT, prophylactic antibiotics, VTE prophylaxis, progressive ambulation and ADL's and discharge planning.The patient is planning to be discharged home.   Linda Calin, PA-C Orthopedic Surgery [facility] [place] [phone]

## 2024-02-07 NOTE — Interval H&P Note (Signed)
 History and Physical Interval Note:  02/07/2024 12:27 PM  [name]  has presented today for surgery, with the diagnosis of Failed Right hip surgery.  The various methods of treatment have been discussed with the patient and family. After consideration of risks, benefits and other options for treatment, the patient has consented to  Procedure(s) with comments: CONVERSION, PREVIOUS HIP SURGERY, TO TOTAL HIP ARTHROPLASTY (Right) - Conversion to right total hip arthroplasty REMOVAL, HARDWARE (Right) - with hardware removal-posterior as a surgical intervention.  The patient's history has been reviewed, patient examined, no change in status, stable for surgery.  I have reviewed the patient's chart and labs.  Questions were answered to the patient's satisfaction.     [name]

## 2024-02-07 NOTE — Discharge Instructions (Signed)

## 2024-02-08 LAB — PROTIME-INR
INR: 1.4 — ABNORMAL HIGH (ref 0.8–1.2)
Prothrombin Time: 17.9 s — ABNORMAL HIGH (ref 11.4–15.2)

## 2024-02-08 LAB — BASIC METABOLIC PANEL WITH GFR
Anion gap: 10 (ref 5–15)
BUN: 18 mg/dL (ref 8–23)
CO2: 26 mmol/L (ref 22–32)
Calcium: 8.6 mg/dL — ABNORMAL LOW (ref 8.9–10.3)
Chloride: 102 mmol/L (ref 98–111)
Creatinine, Ser: 1.48 mg/dL — ABNORMAL HIGH (ref 0.44–1.00)
GFR, Estimated: 37 mL/min — ABNORMAL LOW (ref >60)
Glucose, Bld: 214 mg/dL — ABNORMAL HIGH (ref 70–99)
Potassium: 4.1 mmol/L (ref 3.5–5.1)
Sodium: 138 mmol/L (ref 135–145)

## 2024-02-08 LAB — GLUCOSE, CAPILLARY
Glucose-Capillary: 178 mg/dL — ABNORMAL HIGH (ref 70–99)
Glucose-Capillary: 271 mg/dL — ABNORMAL HIGH (ref 70–99)
Glucose-Capillary: 268 mg/dL — ABNORMAL HIGH (ref 70–99)
Glucose-Capillary: 222 mg/dL — ABNORMAL HIGH (ref 70–99)

## 2024-02-08 LAB — CBC
HCT: 26 % — ABNORMAL LOW (ref 36.0–46.0)
Hemoglobin: 8.1 g/dL — ABNORMAL LOW (ref 12.0–15.0)
MCH: 29.1 pg (ref 26.0–34.0)
MCHC: 31.2 g/dL (ref 30.0–36.0)
MCV: 93.5 fL (ref 80.0–100.0)
Platelets: 230 K/uL (ref 150–400)
RBC: 2.78 MIL/uL — ABNORMAL LOW (ref 3.87–5.11)
RDW: 13.3 % (ref 11.5–15.5)
WBC: 14.8 K/uL — ABNORMAL HIGH (ref 4.0–10.5)
nRBC: 0 % (ref 0.0–0.2)

## 2024-02-08 MED ORDER — WARFARIN SODIUM 2.5 MG PO TABS
2.5000 mg | ORAL_TABLET | Freq: Once | ORAL | Status: AC
  Administered 2024-02-08: 2.5 mg via ORAL
  Filled 2024-02-08: qty 1

## 2024-02-08 NOTE — Progress Notes (Addendum)
 Physical Therapy Treatment Patient Details Name: [name] MRN: [id number] DOB: December 20, 1950 Today's Date: 02/08/2024   History of Present Illness 73 yo female s/p CONVERSION, PREVIOUS R HIP SURGERY, to THA on 02/08/24. PMH: heart block with pacemaker, afib, aortic stenosis, cardiomyopathy, HTN, DM2, R TKA, R RCR, obesity    PT Comments  Pt  progressing  slowly but steadily. Motivated to work with PT. Pt spouse present for session.  Pt was able to take a few steps during session, continues to require +2 ~mod assist for transfers, +2 max assist for bed bed mobility (sleeps in lift chair).  Will continue to work towards goal of 1 assist transfers and short distance ambulation, and reinforce THP during functional tasks. Pt has multiple steps an bil rails at home as well.    If plan is discharge home, recommend the following: Assist for transportation;Help with stairs or ramp for entrance;Assistance with cooking/housework;Two people to help with bathing/dressing/bathroom;Two people to help with walking and/or transfers   Can travel by private vehicle        Equipment Recommendations  None recommended by PT    Recommendations for Other Services       Precautions / Restrictions Precautions Precautions: Fall;Posterior Hip Recall of Precautions/Restrictions: Impaired Precaution/Restrictions Comments: reviewed posterior THP with pt and spouse; continues to requires multi-modal cues to follow during functional mobility Restrictions RLE Weight Bearing Per Provider Order: Partial weight bearing RLE Partial Weight Bearing Percentage or Pounds: 50%     Mobility  Bed Mobility Overal bed mobility: Needs Assistance Bed Mobility: Sit to Supine     Supine to sit: Mod assist, +2 for physical assistance, +2 for safety/equipment Sit to supine: Mod assist, Max assist, +2 for physical assistance, +2 for safety/equipment   General bed mobility comments: step by step cues for sequence and self  assist. assist to control trunk descent and lift bil LEs on to bed. +2 required to complete transition; good effort with lateral scooting in bed, cues for technique and incr time needed to lie flat in preparation for scooting    Transfers Overall transfer level: Needs assistance Equipment used: Rolling walker (2 wheels) Transfers: Sit to/from Stand Sit to Stand: Mod assist, +2 physical assistance, +2 safety/equipment           General transfer comment: multi-modal cues for hand placement, THP, RLE position; assist to rise and transition to RW    Ambulation/Gait Ambulation/Gait assistance: Mod assist, Min assist, +2 physical assistance, +2 safety/equipment Gait Distance (Feet): 5 Feet Assistive device: Rolling walker (2 wheels) Gait Pattern/deviations: Step-to pattern, Wide base of support, Trunk flexed, Antalgic Gait velocity: decr     General Gait Details: pt able to take a few steps steps fwd and back to bed. multi-modal cues for sequence, RW position, use of UEs,  for PWB. assist to balance, manage RW and advance RLE. requires incr time and  fatigues quickly.   Stairs             Wheelchair Mobility     Tilt Bed    Modified Rankin (Stroke Patients Only)       Balance Overall balance assessment: Needs assistance Sitting-balance support: Feet supported, No upper extremity supported Sitting balance-Leahy Scale: Fair     Standing balance support: Reliant on assistive device for balance, During functional activity Standing balance-Leahy Scale: Zero Standing balance comment: reliant on device and external assist  Communication Communication Communication: No apparent difficulties  Cognition Arousal: Alert Behavior During Therapy: Anxious   PT - Cognitive impairments: No apparent impairments                       PT - Cognition Comments: pt expresses that she is fearful of falling Following commands: Intact       Cueing Cueing Techniques: Verbal cues, Tactile cues, Visual cues  Exercises  Ankle pumps x 5 Heel slides x 10 L, AAROM    General Comments        Pertinent Vitals/Pain Pain Assessment Pain Assessment: Faces Faces Pain Scale: Hurts even more Pain Location: right hip Pain Descriptors / Indicators: Discomfort, Sore, Grimacing, Guarding Pain Intervention(s): Limited activity within patient's tolerance, Monitored during session, Premedicated before session, Repositioned    Home Living Family/patient expects to be discharged to:: Private residence Living Arrangements: Spouse/significant other Available Help at Discharge: Family;Available 24 hours/day Type of Home: House Home Access: Stairs to enter Entrance Stairs-Rails: Right;Left;Can reach both Entrance Stairs-Number of Steps: 6   Home Layout: One level Home Equipment: Agricultural consultant (2 wheels);Cane - single point;Shower seat;Lift chair;Wheelchair - manual Additional Comments: sleeps in lift chair, has 2 dogs named Radio producer    Prior Function            PT Goals (current goals can now be found in the care plan section) Acute Rehab PT Goals Patient Stated Goal: home PT Goal Formulation: With patient Time For Goal Achievement: 02/22/24 Potential to Achieve Goals: Fair Progress towards PT goals: Progressing toward goals    Frequency    7X/week      PT Plan      Co-evaluation              AM-PAC PT 6 Clicks Mobility   Outcome Measure  Help needed turning from your back to your side while in a flat bed without using bedrails?: A Lot Help needed moving from lying on your back to sitting on the side of a flat bed without using bedrails?: Total Help needed moving to and from a bed to a chair (including a wheelchair)?: Total Help needed standing up from a chair using your arms (e.g., wheelchair or bedside chair)?: Total Help needed to walk in hospital room?: Total Help needed climbing 3-5 steps with a  railing? : Total 6 Click Score: 7    End of Session Equipment Utilized During Treatment: Gait belt Activity Tolerance: Patient limited by fatigue Patient left: in bed;with call bell/phone within reach;with bed alarm set;with family/visitor present Nurse Communication: Mobility status PT Visit Diagnosis: Other abnormalities of gait and mobility (R26.89);History of falling (Z91.81)     Time: 8477-8458 PT Time Calculation (min) (ACUTE ONLY): 19 min  Charges:    $Therapeutic Activity: 8-22 mins PT General Charges $$ ACUTE PT VISIT: 1 Visit                     Nicholas Trompeter, PT  Acute Rehab Dept [facility]) [phone]  02/08/2024    [facility] 02/08/2024, 3:57 PM

## 2024-02-08 NOTE — Brief Op Note (Signed)
 02/07/2024  6:51 AM  PATIENT:  [name]  73 y.o. 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  PROCEDURE:  Procedure(s) with comments: CONVERSION, PREVIOUS HIP SURGERY, TO TOTAL HIP ARTHROPLASTY (Right) - Conversion to right total hip arthroplasty REMOVAL, HARDWARE (Right) - with hardware removal-posterior  SURGEON:  Surgeons and Role:    DEWAINE Ernie Cough, MD - Primary  PHYSICIAN ASSISTANT: Rosina Calin, PA-C  ANESTHESIA:   spinal  EBL:  1100 mL   BLOOD ADMINISTERED:none  DRAINS: none   LOCAL MEDICATIONS USED:  NONE  SPECIMEN:  No Specimen  DISPOSITION OF SPECIMEN:  N/A  COUNTS:  YES  TOURNIQUET:  * No tourniquets in log *  DICTATION: .Other Dictation: Dictation Number [id number]  PLAN OF CARE: Admit to inpatient   PATIENT DISPOSITION:  PACU - hemodynamically stable.   Delay start of Pharmacological VTE agent (>24hrs) due to surgical blood loss or risk of bleeding: no

## 2024-02-08 NOTE — Op Note (Unsigned)
 [name], [name] MEDICAL RECORD NO: [id number] ACCOUNT NO: [id number] DATE OF BIRTH: August 18, 1950 FACILITY: [facility] LOCATION: [id number] PHYSICIAN: Donnice BIRCH. Ernie, MD  Operative Report   DATE OF PROCEDURE: 02/07/2024  PREOPERATIVE DIAGNOSIS:  History of right intertrochanteric femur fracture status post trochanteric femoral nailing with malunion and development of osteoarthritis.  POSTOPERATIVE DIAGNOSIS: History of right intertrochanteric femur fracture status post trochanteric femoral nailing with malunion and development of osteoarthritis.  PROCEDURE:  Conversion of failed right hip surgery to right total hip arthroplasty.  COMPONENTS USED:  DePuy hip system with a size 58 Pinnacle shell, 2 cancellous screws, a 36 +4 10-degree face-changing liner with a 17 high-offset mono RECLAIM stem with a 36 +8.5 Delta ceramic ball.  SURGEON:  Donnice BIRCH. Ernie, MD  ASSISTANT:  Rosina Calin, PA-C.  Note that Ms. Calin was present for the entirety of the case from preoperative positioning, perioperative management of the operative extremity, general facilitation of the case, and primary wound closure.  ANESTHESIA:  Spinal.  BLOOD LOSS:  About 1100 mL.  DRAINS:  None.  COMPLICATIONS:  None apparent.  INDICATIONS FOR THE PROCEDURE:  The patient is a pleasant 73 year old female with a history of right intertrochanteric femur fracture.  She underwent trochanteric femoral nailing of this with a [employer] INTERTAN nail by Dr. Franky Sport.  During  her postoperative period, she was noted to have healed with noted malunion of the proximal femur, but the development of osteoarthritis and pain.  She was referred for surgical management.  We discussed and reviewed the necessity to proceed with total  hip replacement based on pain and dysfunction.  We reviewed the challenges of these types of procedures including infection, DVT, dislocation, neurovascular injury, and the need for future surgeries.   Based on her pain level, she wished to proceed.   Consent was obtained for the benefit of pain relief.  DESCRIPTION OF PROCEDURE:  The patient was brought to the operative theater.  Once adequate anesthesia was established, she was positioned into the left lateral decubitus position with the right hip up.  Preoperative antibiotics and TXA were  administered.  Her right hip was prepped and draped in a sterile fashion.  A timeout was performed identifying the patient, planned procedure and extremity.  Her right lower extremity was prepped down to the knee in order to remove a distal interlock  screw.  I first focused on removing the distal interlock.  Through the distal incision, the screw was identified and removed without complication.  Once this was done, I reapproximated the incision through the iliotibial band.  This wound was closed once  the case was completed.  Once this was done, we attended to the proximal femur.  A posterior approach to the hip was carried out.  Soft tissue dissection was carried down to the iliotibial band and gluteal fascia.  This fascia was incised exposing the  trochanteric nail through her malunion as well as the lag screws.  Her true lag screws were readily identified and removed without issue.  We then removed the trochanteric femoral nail.  The removal of the nail was performed without complication.  Once  this was done, we exposed the posterior aspect of the hip.  She was noted to have malunion of the intertrochanteric femur fracture with posterior displaced and healed trochanteric segments as well as anterior-based fragments.  Once we had the posterior  aspect of the hip exposed, the hip was dislocated and a neck osteotomy was made removing  the femoral head noting advanced degenerative changes.  There was some penetration of the lag screws of the femoral head as well.  With the femoral neck removed, I  first focused attention on the acetabulum.  Retractors were placed  around the acetabulum for exposure.  I then began reaming with a 47-mm reamer and ended up reaming up to 57 mm to remove fibrous tissue as well as to create a healthy bed of cancellous  bone.  A final 58 mm Pinnacle shell was then impacted.  Using my hip guide, it appeared that she had at least 20 degrees of forward flexion with the anterior rim of the acetabulum below her anterior acetabulum.  I was able to place 2 cancellous screws  into the ilium.  At this point, I placed a trial neutral liner.  Once this was done, we focused on femoral preparation.  The challenges of the malunion were addressed by removing malunited posterior trochanteric bone for exposure.  Based on the fact that  her trochanteric nail had healed through the lateral aspect of her trochanter, some of the bone in this area was compromised.  We tried to maintain as much continuity of her gluteus muscle to the remaining trochanter.  I was able to identify the femoral  canal with a canal finder.  We subsequently prepared the proximal femur for the Monobloc RECLAIM stem.  I used the reamer sequentially and reamed up to 17 mm where we got very good purchase within the isthmus and diaphysis of the femur.  We used the  standard length femoral component.  With this reamer in place, we did a trial reduction.  We selected the high offset neck.  With this, I trialed balls for stability.  Combined anteversion was noted to be about 40 degrees and thus I elected to use the  face-changing liner.  We also noted impingement anteriorly related to the trochanteric malunion.  I subsequently debrided the anterior aspect of her proximal femur using an osteotome and debrided scar tissue in this area to create a palpable gap between  the bone and her anterior acetabulum.  Given all this procedure, we then placed the final component.  The final size 17 high-offset Monobloc RECLAIM stem was then impacted and sat where the reaming had prepared the proximal femur.   The orientation and  anteversion of the femoral neck was based on the trial reduction.  The final 36 +4 face-changing 10-degree liner was placed into the acetabulum with the face-changing portion at approximately 10 o'clock for the right hip.  We then did other trial  reductions and I selected the 8.5 ball to provide stability and decrease chances for impingement anteriorly.  In this position, her leg lengths appear to be comparable to the down leg.  She did have tightness from her previous procedure noted.  There was  no evidence of impingement with external rotation and abduction and no evidence of subluxation with her leg adducted and internally rotated.  For this reason, we selected the 36 +8.5 ball.  It was impacted on the clean and dry trunnion and the hip was  reduced.  At this point, the wound was irrigated as it had been throughout the case.  We reapproximated the iliotibial band and gluteal fascia with a combination of #1 Vicryl sutures and #1 Stratafix suture.  The remainder of the wound was closed in  layers with 2-0 Vicryl and running Monocryl stitch.  The distal incision was closed in a similar fashion.  All wounds were cleaned, dried, and dressed sterilely using surgical glue and Aquacel dressing.  She was then awoken from anesthesia and brought to  the recovery room in stable condition and tolerated the procedure well.  Based on intraoperative findings and quality of her bone, she will be partial weightbearing until we have confidence of healing of her acetabular shell and femoral component.  We will also have her protected with posterior hip precautions.   PUS D: 02/08/2024 7:02:08 am T: 02/08/2024 9:46:00 am  JOB: [id number]/ [id number]

## 2024-02-08 NOTE — Plan of Care (Signed)
   Problem: Clinical Measurements: Goal: Ability to maintain clinical measurements within normal limits will improve Outcome: Progressing

## 2024-02-08 NOTE — Progress Notes (Signed)
 PHARMACY - ANTICOAGULATION CONSULT NOTE  Pharmacy Consult for warfarin Indication: mech MVR, valvular Afib  Allergies  Allergen Reactions   Nystatin  Other (See Comments)    unknown   Other Other (See Comments)    Product Containing 3-Hydroxy-3-Methylglutaryl-Coenzyme A Reductase Inhibitor (Product) unknown   Statins Other (See Comments)    Muscles aches,hurt    Patient Measurements: Height: 5' 8 (172.7 cm) Weight: 105.7 kg (233 lb 0.4 oz) IBW/kg (Calculated) : 63.9 HEPARIN  DW (KG): 87.6  Vital Signs: Temp: 97.7 F (36.5 C) (10/15 0604) BP: 152/81 (10/15 0604) Pulse Rate: 60 (10/15 0604)  Labs: Recent Labs    02/07/24 1402 02/08/24 0323  HGB  --  8.1*  HCT  --  26.0*  PLT  --  230  LABPROT 17.3* 17.9*  INR 1.3* 1.4*  CREATININE  --  1.48*    Estimated Creatinine Clearance: 41.8 mL/min (A) (by C-G formula based on SCr of 1.48 mg/dL (H)).   Medical History: Past Medical History:  Diagnosis Date   Aortic stenosis    21 mm Inspiris Edwards AVR June 2024 - [facility]   Arthritis    Cervical incompetence    Dyspnea    GERD (gastroesophageal reflux disease)    Hay fever    Heart block    [employer] DCPPM on 10/26/2022 with Dr. Saturnino - [facility]   Hemochromatosis    HOCM (hypertrophic obstructive cardiomyopathy) (HCC)    Septal myomectomy June 2024 - [facility]   Hypertension    Migraines    Mitral stenosis    25 mm Regent mechanical MVR June 2024 - [facility]   Paroxysmal atrial fibrillation (HCC)    Presence of permanent cardiac pacemaker    Sciatica    Type 2 diabetes mellitus (HCC)     Medications:  Medications Prior to Admission  Medication Sig Dispense Refill Last Dose/Taking   acetaminophen  (TYLENOL ) 325 MG tablet Take 2 tablets (650 mg total) by mouth every 6 (six) hours as needed for mild pain (pain score 1-3) (or Fever >/= 101).   Past Week   amiodarone  (PACERONE ) 200 MG tablet TAKE 1 TABLET BY MOUTH TWICE DAILY X3 WEEKS THEN 1 TABLET EVERY DAY THERAFTER.  (Patient taking differently: Take 200 mg by mouth in the morning.) 90 tablet 1 02/07/2024   enoxaparin (LOVENOX) 100 MG/ML injection Inject 1 mL (100 mg total) into the skin every 12 (twelve) hours. 10 mL 1 02/06/2024 at  9:30 AM   ezetimibe  (ZETIA ) 10 MG tablet Take 10 mg by mouth every morning.   02/07/2024   losartan  (COZAAR ) 50 MG tablet Take 1 tablet (50 mg total) by mouth daily. 90 tablet 3 02/06/2024   magnesium  oxide (MAG-OX) 400 (240 Mg) MG tablet Take 400 mg by mouth in the morning.   Past Week   MELATONIN GUMMIES PO Take 10 mg by mouth at bedtime.   Past Week   metFORMIN  (GLUCOPHAGE ) 1000 MG tablet Take 1,000 mg by mouth 2 (two) times daily.   02/06/2024   metoprolol  succinate (TOPROL -XL) 25 MG 24 hr tablet Take 1 tablet (25 mg total) by mouth 2 (two) times daily. 180 tablet 3 02/07/2024   nitrofurantoin  (MACRODANTIN ) 50 MG capsule Take 50 mg by mouth in the morning.   02/06/2024   omeprazole (PRILOSEC) 20 MG capsule Take 20 mg by mouth daily before breakfast.  3 02/07/2024   warfarin (COUMADIN ) 2.5 MG tablet TAKE 1 TABLET BY MOUTH DAILY. EXCEPT 1/2 TABLET ON MONDAYS AND THURSDAYS OR AS DIRECTED 90  tablet 1 02/01/2024   Scheduled:   acetaminophen   1,000 mg Oral Q6H   amiodarone   200 mg Oral q AM   dexamethasone  (DECADRON ) injection  10 mg Intravenous Once   enoxaparin (LOVENOX) injection  100 mg Subcutaneous BID   ezetimibe   10 mg Oral q morning   insulin  aspart  0-15 Units Subcutaneous TID WC   insulin  aspart  0-5 Units Subcutaneous QHS   losartan   50 mg Oral Daily   melatonin  10 mg Oral QHS   metFORMIN   1,000 mg Oral BID WC   metoprolol  succinate  25 mg Oral BID   nitrofurantoin   50 mg Oral q AM   pantoprazole   40 mg Oral Daily   polyethylene glycol  17 g Oral BID   senna  2 tablet Oral QHS   Warfarin - Pharmacist Dosing Inpatient   Does not apply q1600   PRN: alum & mag hydroxide-simeth, bisacodyl , diphenhydrAMINE , HYDROmorphone  (DILAUDID ) injection, menthol  **OR** phenol,  methocarbamol  **OR** methocarbamol  (ROBAXIN ) injection, metoCLOPramide  **OR** metoCLOPramide  (REGLAN ) injection, ondansetron  **OR** ondansetron  (ZOFRAN ) IV, oxyCODONE , oxyCODONE    Assessment: 10 yoF with PMH HOCM and aortic stenosis s/p bioprosthetic AVR, mechanical MVR on warfarin with INR goal 2.5-3.5; Afib, heart block s/p PPM, HTN, HLD, DM2, admitted 10/14 for total hip arthroplasty. Bridged preoperatively per her Coumadin  clinic with therapeutic Lovenox following schedule below. Pharmacy to coordinate anticoagulation postoperatively through to discharge.   Bridging instructions per Coumadin  Clinic: 10/8  Last dose of warfarin 10/9  No Lovenox or warfarin 10/10 - 10/12  Lovenox 100mg  sq at 7am & 7pm 10/13  Lovenox 100mg  sq at 7am ----------No Lovenox in PM 10/14  No Lovenox in AM --------surgery-------admit to hospital.  Follow hospital instructions at discharge.  Noted procedure was completed late in the day (~18:00), and per Ortho PA, EBL ~1100 ml (significant). Will plan to start warfarin 10/14 PM and resume therapeutic Lovenox 10/15 AM  Baseline INR subtherapeutic as expected after holding warfarin for nearly a week Prior anticoagulation: warfarin 1.25 mg on Mon & Thurs; 2.5 mg all other days Chronically on amiodarone  200 mg daily (continued perioperatively)  Significant events:  Today, 02/08/2024: CBC: hgb 8.1 (low), plts 230 (WNL) INR 1.4 - low but slightly improved from yesterday Major drug interactions: amiodarone  (chronic) No bleeding issues documented Operative EBL ~1100 ml per PA Regular diet ordered; ate 100% of meal 10/14 PM  Goal of Therapy: INR 2.5 - 3.5  Plan: Warfarin 2.5 mg PO tonight x 1 Lovenox 100 mg SQ bid to start this AM Daily INR CBC at least q72 hr while on warfarin Monitor for signs of bleeding or thrombosis   Lacinda Moats, PharmD Clinical Pharmacist  10/15/20258:17 AM

## 2024-02-08 NOTE — Progress Notes (Signed)
   Subjective: 1 Day Post-Op Procedure(s) (LRB): CONVERSION, PREVIOUS HIP SURGERY, TO TOTAL HIP ARTHROPLASTY (Right) REMOVAL, HARDWARE (Right) Patient reports pain as moderate.   Patient seen in rounds with Dr. Ernie. Patient is resting in bed on exam this morning. No acute events overnight. Foley catheter removed. Patient has not been up with PT yet.  We will start therapy today.   Objective: Vital signs in last 24 hours: Temp:  [97.6 F (36.4 C)-98.5 F (36.9 C)] 97.7 F (36.5 C) (10/15 0604) Pulse Rate:  [58-63] 60 (10/15 0604) Resp:  [13-22] 16 (10/15 0604) BP: (97-204)/(52-98) 152/81 (10/15 0604) SpO2:  [96 %-100 %] 99 % (10/15 0604) Weight:  [102.1 kg-105.7 kg] 105.7 kg (10/14 2245)  Intake/Output from previous day:  Intake/Output Summary (Last 24 hours) at 02/08/2024 0744 Last data filed at 02/08/2024 0605 Gross per 24 hour  Intake 4098.8 ml  Output 3225 ml  Net 873.8 ml     Intake/Output this shift: No intake/output data recorded.  Labs: Recent Labs    02/08/24 0323  HGB 8.1*   Recent Labs    02/08/24 0323  WBC 14.8*  RBC 2.78*  HCT 26.0*  PLT 230   Recent Labs    02/08/24 0323  NA 138  K 4.1  CL 102  CO2 26  BUN 18  CREATININE 1.48*  GLUCOSE 214*  CALCIUM 8.6*   Recent Labs    02/07/24 1402 02/08/24 0323  INR 1.3* 1.4*    Exam: General - Patient is Alert and Oriented Extremity - Neurologically intact Sensation intact distally Intact pulses distally Dorsiflexion/Plantar flexion intact Dressing - dressing C/D/I Motor Function - intact, moving foot and toes well on exam.   Past Medical History:  Diagnosis Date   Aortic stenosis    21 mm Inspiris Edwards AVR June 2024 - [facility]   Arthritis    Cervical incompetence    Dyspnea    GERD (gastroesophageal reflux disease)    Hay fever    Heart block    [employer] DCPPM on 10/26/2022 with Dr. Saturnino - [facility]   Hemochromatosis    HOCM (hypertrophic obstructive cardiomyopathy)  (HCC)    Septal myomectomy June 2024 - [facility]   Hypertension    Migraines    Mitral stenosis    25 mm [employer]ent mechanical MVR June 2024 - [facility]   Paroxysmal atrial fibrillation (HCC)    Presence of permanent cardiac pacemaker    Sciatica    Type 2 diabetes mellitus (HCC)     Assessment/Plan: 1 Day Post-Op Procedure(s) (LRB): CONVERSION, PREVIOUS HIP SURGERY, TO TOTAL HIP ARTHROPLASTY (Right) REMOVAL, HARDWARE (Right) Principal Problem:   S/P total right hip arthroplasty  Estimated body mass index is 35.43 kg/m as calculated from the following:   Height as of this encounter: 5' 8 (1.727 m).   Weight as of this encounter: 105.7 kg. Advance diet Up with therapy D/C IV fluids  DVT Prophylaxis - Coumadin   Hgb stable at 8.1 Will restart coumadin  today and follow anticoag instructions as indicated by her clinic Possible she will drop <7 and require transfusion  PWB 50% Posterior hip precautions  Up with PT  Will keep another day or two to monitor hgb as well as ensure she can safely follow hip precautions/PWB status  Rosina Calin, PA-C Orthopedic Surgery [phone] 02/08/2024, 7:44 AM

## 2024-02-08 NOTE — Anesthesia Postprocedure Evaluation (Signed)
 Anesthesia Post Note  Patient: [name]  Procedure(s) Performed: CONVERSION, PREVIOUS HIP SURGERY, TO TOTAL HIP ARTHROPLASTY (Right: Hip) REMOVAL, HARDWARE (Right: Hip)     Patient location during evaluation: PACU Anesthesia Type: MAC and Spinal Level of consciousness: awake and alert Pain management: pain level controlled Vital Signs Assessment: post-procedure vital signs reviewed and stable Respiratory status: spontaneous breathing, nonlabored ventilation, respiratory function stable and patient connected to nasal cannula oxygen Cardiovascular status: stable and blood pressure returned to baseline Postop Assessment: no apparent nausea or vomiting Anesthetic complications: no   No notable events documented.  Last Vitals:  Vitals:   02/07/24 2118 02/07/24 2324  BP: 136/72 127/77  Pulse: 62 60  Resp: 16 17  Temp: 36.4 C 36.6 C  SpO2: 98% 98%    Last Pain:  Vitals:   02/08/24 0021  TempSrc:   PainSc: 7                  Makael Stein P Mishael Haran

## 2024-02-08 NOTE — Evaluation (Signed)
 Physical Therapy Evaluation Patient Details Name: [name] MRN: [id number] DOB: Feb 22, 1951 Today's Date: 02/08/2024  History of Present Illness  73 yo female s/p CONVERSION, PREVIOUS R HIP SURGERY, to THA on 02/08/24. PMH: heart block with pacemaker, afib, aortic stenosis, cardiomyopathy, HTN, DM2, R TKA, R RCR, obesity  Clinical Impression  Pt admitted with above diagnosis.  Pt reports she amb ~ 25' with RW at baseline, uses w/c in the home, sleeps in lift chair. Pt states she went to rehab/SNF after her hip fx and does not want to do that again.  D/c plan per ortho MD, anticipate prolonged acute stay pending spouse's ability to assist.  PT focus will be on increasing pt's independence with transfers, she may need to d/c at w/c level/amb short distances. Would benefit from HHPT if ortho team agreeable.  Pt currently with functional limitations due to the deficits listed below (see PT Problem List). Pt will benefit from acute skilled PT to increase their independence and safety with mobility to allow discharge.           If plan is discharge home, recommend the following: Assist for transportation;Help with stairs or ramp for entrance;Assistance with cooking/housework;Two people to help with bathing/dressing/bathroom;Two people to help with walking and/or transfers   Can travel by private vehicle        Equipment Recommendations None recommended by PT  Recommendations for Other Services       Functional Status Assessment Patient has had a recent decline in their functional status and demonstrates the ability to make significant improvements in function in a reasonable and predictable amount of time.     Precautions / Restrictions Precautions Precautions: Fall;Posterior Hip      Mobility  Bed Mobility Overal bed mobility: Needs Assistance Bed Mobility: Supine to Sit     Supine to sit: Mod assist, +2 for physical assistance, +2 for safety/equipment     General bed  mobility comments: step by step cues for sequence and self assist. assist to scoot laterally, progress LEs off EOB and elevate trunk. +2 required to complete transition    Transfers Overall transfer level: Needs assistance Equipment used: Rolling walker (2 wheels) Transfers: Sit to/from Stand Sit to Stand: Max assist, +2 physical assistance, +2 safety/equipment           General transfer comment: multi-modal cues for hand placement, THP, RLE position; assist to rise and transition to RW    Ambulation/Gait Ambulation/Gait assistance: Mod assist, Min assist, +2 physical assistance, +2 safety/equipment Gait Distance (Feet): 4 Feet Assistive device: Rolling walker (2 wheels) Gait Pattern/deviations: Step-to pattern, Wide base of support, Trunk flexed       General Gait Details: assist to balance, manage RW and advance RLE. pt fatigues quickly, chair pulled to pt for seated rest;  Stairs            Wheelchair Mobility     Tilt Bed    Modified Rankin (Stroke Patients Only)       Balance Overall balance assessment: History of Falls (pt endorses multiple falls in last 6 mos) Sitting-balance support: Feet supported, No upper extremity supported Sitting balance-Leahy Scale: Fair     Standing balance support: Reliant on assistive device for balance, During functional activity Standing balance-Leahy Scale: Poor                               Pertinent Vitals/Pain Pain Assessment Pain Assessment: Faces Faces Pain  Scale: Hurts even more Pain Location: right hip Pain Descriptors / Indicators: Discomfort, Sore, Grimacing, Guarding Pain Intervention(s): Limited activity within patient's tolerance, Monitored during session, Premedicated before session    Home Living Family/patient expects to be discharged to:: Private residence Living Arrangements: Spouse/significant other Available Help at Discharge: Family;Available 24 hours/day Type of Home: House Home  Access: Stairs to enter Entrance Stairs-Rails: Right;Left;Can reach both Entrance Stairs-Number of Steps: 6   Home Layout: One level Home Equipment: Agricultural consultant (2 wheels);Cane - single point;Shower seat;Lift chair;Wheelchair - manual Additional Comments: sleeps in lift chair, has 2 dogs named Radio producer    Prior Function Prior Level of Function : Independent/Modified Independent             Mobility Comments: used RW or w/c in the house ADLs Comments: spouse assists with ADLs/iADLs     Extremity/Trunk Assessment   Upper Extremity Assessment Upper Extremity Assessment: Generalized weakness    Lower Extremity Assessment Lower Extremity Assessment: RLE deficits/detail;LLE deficits/detail RLE Deficits / Details: ankle WFL; knee flexion/extension grossly 3/5, hip flexors 2+/5, further testing limited by pain RLE Sensation: decreased light touch;decreased proprioception LLE Deficits / Details: generalized weakness       Communication        Cognition Arousal: Alert Behavior During Therapy: Anxious   PT - Cognitive impairments: No apparent impairments                       PT - Cognition Comments: pt is fearful of falling Following commands: Intact       Cueing Cueing Techniques: Verbal cues, Tactile cues     General Comments      Exercises     Assessment/Plan    PT Assessment Patient needs continued PT services  PT Problem List Decreased strength;Decreased activity tolerance;Pain;Decreased knowledge of use of DME;Decreased mobility       PT Treatment Interventions DME instruction;Therapeutic exercise;Gait training;Functional mobility training;Therapeutic activities;Patient/family education;Stair training    PT Goals (Current goals can be found in the Care Plan section)  Acute Rehab PT Goals Patient Stated Goal: home PT Goal Formulation: With patient Time For Goal Achievement: 02/22/24 Potential to Achieve Goals: Fair    Frequency  7X/week     Co-evaluation               AM-PAC PT 6 Clicks Mobility  Outcome Measure Help needed turning from your back to your side while in a flat bed without using bedrails?: A Little Help needed moving from lying on your back to sitting on the side of a flat bed without using bedrails?: A Little Help needed moving to and from a bed to a chair (including a wheelchair)?: A Lot Help needed standing up from a chair using your arms (e.g., wheelchair or bedside chair)?: A Lot Help needed to walk in hospital room?: Total Help needed climbing 3-5 steps with a railing? : Total 6 Click Score: 12    End of Session Equipment Utilized During Treatment: Gait belt Activity Tolerance: Patient limited by fatigue Patient left: with call bell/phone within reach;with chair alarm set;in chair Nurse Communication: Mobility status PT Visit Diagnosis: Other abnormalities of gait and mobility (R26.89);History of falling (Z91.81)    Time: 8984-8950 PT Time Calculation (min) (ACUTE ONLY): 34 min   Charges:   PT Evaluation $PT Eval Low Complexity: 1 Low PT Treatments $Therapeutic Activity: 8-22 mins PT General Charges $$ ACUTE PT VISIT: 1 Visit  Rexene, PT  Acute Rehab Dept [facility]) [phone]  02/08/2024   [facility] 02/08/2024, 2:33 PM

## 2024-02-08 NOTE — TOC Transition Note (Signed)
 Transition of Care [facility]) - Discharge Note   Patient Details  Name: [name] MRN: [id number] Date of Birth: 10/14/1950  [facility]) CM/SW Contact:  NORMAN ASPEN, LCSW Phone Number: 02/08/2024, 10:05 AM   Clinical Narrative:     Met with pt who confirms she has needed DME in the home.  Plan for HEP.  No further IP CM needs.  Final next level of care: Home/Self Care Barriers to Discharge: No Barriers Identified   Patient Goals and CMS Choice Patient states their goals for this hospitalization and ongoing recovery are:: return home          Discharge Placement                       Discharge Plan and Services Additional resources added to the After Visit Summary for                  DME Arranged: N/A DME Agency: NA                  Social Drivers of Health (SDOH) Interventions SDOH Screenings   Food Insecurity: No Food Insecurity (02/07/2024)  Housing: Low Risk  (02/07/2024)  Transportation Needs: No Transportation Needs (02/07/2024)  Utilities: Not At Risk (02/07/2024)  Depression (PHQ2-9): Low Risk  (04/18/2023)  Social Connections: Moderately Integrated (02/07/2024)  Tobacco Use: Medium Risk (02/07/2024)     Readmission Risk Interventions    02/08/2024   10:04 AM  Readmission Risk Prevention Plan  Transportation Screening Complete  PCP or Specialist Appt within 5-7 Days Complete  Home Care Screening Complete  Medication Review (RN CM) Complete

## 2024-02-09 ENCOUNTER — Inpatient Hospital Stay (HOSPITAL_COMMUNITY)

## 2024-02-09 ENCOUNTER — Encounter (HOSPITAL_COMMUNITY): Payer: Self-pay | Admitting: Orthopedic Surgery

## 2024-02-09 LAB — CBC
HCT: 26.2 % — ABNORMAL LOW (ref 36.0–46.0)
Hemoglobin: 7.9 g/dL — ABNORMAL LOW (ref 12.0–15.0)
MCH: 28.6 pg (ref 26.0–34.0)
MCHC: 30.2 g/dL (ref 30.0–36.0)
MCV: 94.9 fL (ref 80.0–100.0)
Platelets: 253 K/uL (ref 150–400)
RBC: 2.76 MIL/uL — ABNORMAL LOW (ref 3.87–5.11)
RDW: 13.8 % (ref 11.5–15.5)
WBC: 18 K/uL — ABNORMAL HIGH (ref 4.0–10.5)
nRBC: 0 % (ref 0.0–0.2)

## 2024-02-09 LAB — GLUCOSE, CAPILLARY
Glucose-Capillary: 148 mg/dL — ABNORMAL HIGH (ref 70–99)
Glucose-Capillary: 148 mg/dL — ABNORMAL HIGH (ref 70–99)
Glucose-Capillary: 168 mg/dL — ABNORMAL HIGH (ref 70–99)
Glucose-Capillary: 186 mg/dL — ABNORMAL HIGH (ref 70–99)

## 2024-02-09 LAB — PROTIME-INR
INR: 1.9 — ABNORMAL HIGH (ref 0.8–1.2)
Prothrombin Time: 22.5 s — ABNORMAL HIGH (ref 11.4–15.2)

## 2024-02-09 MED ORDER — WARFARIN SODIUM 2.5 MG PO TABS
2.5000 mg | ORAL_TABLET | Freq: Once | ORAL | Status: AC
  Administered 2024-02-09: 2.5 mg via ORAL
  Filled 2024-02-09: qty 1

## 2024-02-09 NOTE — Progress Notes (Signed)
 Physical Therapy Treatment Patient Details Name: [name] MRN: [id number] DOB: [birth year] Today's Date: 02/09/2024   History of Present Illness 73 yo female s/p CONVERSION, PREVIOUS R HIP SURGERY, to THA on 02/08/24. PMH: heart block with pacemaker, afib, aortic stenosis, cardiomyopathy, HTN, DM2, R TKA, R RCR, obesity    PT Comments  POD # 2 pm session NT reports Pt has been OOB to recliner since around 9am and also been up/down BSC x 2.  PT - Cognition Comments: AxO x 3 pleasant and less anxious and less fearful today.  Repeat Instruction on PWB and stepping sequence with proper walker placement under with straight elbows.  General transfer comment: increased time and effort with + 1 assist from recliner to [facility] then + 1 assist from [facility] to amb but then needed + 2 assist on her last sit to stand due to increased c/o weakness/fatigue.  50% VC's on proper walker to self placement under you as well as sequencing plus strong arms on walker to ensure PWB when advancing L LE.  Also assisted on/off BSC.General Gait Details: VERY limited amb distance of 1 foot with 50% VC's on proper walker to self distance and sequencing.  MAX c/o weakness/fatigue. Noted it is late in the day and HgB 7.9 Assisted back to bed.  General bed mobility comments: Pt required Max Assist + 2 to transfer from EOB to supine with increased care to minamize pain/anxiety.  Sit/swival on bed pad. Positioned to comfort.      If plan is discharge home, recommend the following:     Can travel by private vehicle        Equipment Recommendations       Recommendations for Other Services       Precautions / Restrictions Precautions Precautions: Fall;Posterior Hip Recall of Precautions/Restrictions: Impaired Precaution/Restrictions Comments: THP Restrictions Weight Bearing Restrictions Per Provider Order: Yes RLE Weight Bearing Per Provider Order: Partial weight bearing RLE Partial Weight Bearing Percentage or  Pounds: 50%     Mobility  Bed Mobility Overal bed mobility: Needs Assistance Bed Mobility: Sit to Supine       Sit to supine: Max assist, +2 for physical assistance, +2 for safety/equipment   General bed mobility comments: Pt required Max Assist + 2 to transfer from EOB to supine with increased care to minamize pain/anxiety.  Sit/swival on bed pad.    Transfers Overall transfer level: Needs assistance Equipment used: Rolling walker (2 wheels) Transfers: Sit to/from Stand Sit to Stand: Mod assist, +2 physical assistance, +2 safety/equipment           General transfer comment: increased time and effort with + 1 assist from recliner to [facility] then + 1 assist from [facility] to amb but then needed + 2 assist on her last sit to stand due to increased c/o weakness/fatigue.  50% VC's on proper walker to self placement under you as well as sequencing plus strong arms on walker to ensure PWB when advancing L LE.  Also assisted on/off BSC.    Ambulation/Gait Ambulation/Gait assistance: Mod assist, +2 physical assistance, +2 safety/equipment Gait Distance (Feet): 2 Feet Assistive device: Rolling walker (2 wheels) Gait Pattern/deviations: Step-to pattern, Wide base of support, Trunk flexed, Antalgic, Decreased stance time - right Gait velocity: decr     General Gait Details: VERY limited amb distance of 1 foot with 50% VC's on proper walker to self distance and sequencing.  MAX c/o weakness/fatigue. Noted it is late in the day and HgB 7.9  Stairs             Wheelchair Mobility     Tilt Bed    Modified Rankin (Stroke Patients Only)       Balance                                            Communication    Cognition Arousal: Alert Behavior During Therapy: WFL for tasks assessed/performed   PT - Cognitive impairments: No apparent impairments                       PT - Cognition Comments: AxO x 3 pleasant and less anxious and less fearful  today.  Repeat Instruction on PWB and stepping sequence with proper walker placement under with straight elbows. Following commands: Intact      Cueing Cueing Techniques: Verbal cues, Tactile cues, Visual cues  Exercises      General Comments        Pertinent Vitals/Pain Pain Assessment Pain Assessment: 0-10 Pain Score: 5  Pain Location: right hip Pain Descriptors / Indicators: Discomfort, Sore, Grimacing, Guarding, Operative site guarding Pain Intervention(s): Monitored during session, Repositioned, Patient requesting pain meds-RN notified, Ice applied    Home Living                          Prior Function            PT Goals (current goals can now be found in the care plan section)      Frequency    7X/week      PT Plan      Co-evaluation              AM-PAC PT 6 Clicks Mobility   Outcome Measure  Help needed turning from your back to your side while in a flat bed without using bedrails?: A Lot Help needed moving from lying on your back to sitting on the side of a flat bed without using bedrails?: A Lot Help needed moving to and from a bed to a chair (including a wheelchair)?: A Lot Help needed standing up from a chair using your arms (e.g., wheelchair or bedside chair)?: A Lot Help needed to walk in hospital room?: A Lot Help needed climbing 3-5 steps with a railing? : Total 6 Click Score: 11    End of Session Equipment Utilized During Treatment: Gait belt Activity Tolerance: Patient limited by fatigue Patient left: in bed;with call bell/phone within reach;with bed alarm set;with family/visitor present Nurse Communication: Mobility status PT Visit Diagnosis: Other abnormalities of gait and mobility (R26.89);History of falling (Z91.81)     Time: 8651-8588 PT Time Calculation (min) (ACUTE ONLY): 23 min  Charges:    $Therapeutic Activity: 23-37 mins PT General Charges $$ ACUTE PT VISIT: 1 Visit                     Katheryn Leap  PTA Acute  Rehabilitation Services Office M-F          [phone]

## 2024-02-09 NOTE — Progress Notes (Signed)
   Subjective: 2 Days Post-Op Procedure(s) (LRB): CONVERSION, PREVIOUS HIP SURGERY, TO TOTAL HIP ARTHROPLASTY (Right) REMOVAL, HARDWARE (Right) Patient reports pain as mild.   Patient seen in rounds with Dr. Ernie. Patient is resting in bed on exam this morning. No acute events overnight. She ambulated with PT yesterday.  We will start therapy today.   Objective: Vital signs in last 24 hours: Temp:  [97.5 F (36.4 C)-98.7 F (37.1 C)] 97.5 F (36.4 C) (10/16 0559) Pulse Rate:  [59-65] 59 (10/16 0559) Resp:  [15-18] 15 (10/16 0559) BP: (114-138)/(53-58) 127/58 (10/16 0559) SpO2:  [92 %-93 %] 93 % (10/16 0559)  Intake/Output from previous day:  Intake/Output Summary (Last 24 hours) at 02/09/2024 0734 Last data filed at 02/09/2024 0600 Gross per 24 hour  Intake 1289.1 ml  Output 200 ml  Net 1089.1 ml     Intake/Output this shift: No intake/output data recorded.  Labs: Recent Labs    02/08/24 0323 02/09/24 0322  HGB 8.1* 7.9*   Recent Labs    02/08/24 0323 02/09/24 0322  WBC 14.8* 18.0*  RBC 2.78* 2.76*  HCT 26.0* 26.2*  PLT 230 253   Recent Labs    02/08/24 0323  NA 138  K 4.1  CL 102  CO2 26  BUN 18  CREATININE 1.48*  GLUCOSE 214*  CALCIUM 8.6*   Recent Labs    02/08/24 0323 02/09/24 0322  INR 1.4* 1.9*    Exam: General - Patient is Alert and Oriented Extremity - Neurologically intact Sensation intact distally Intact pulses distally Dorsiflexion/Plantar flexion intact Dressing - dressing C/D/I Motor Function - intact, moving foot and toes well on exam.   Past Medical History:  Diagnosis Date   Aortic stenosis    21 mm Inspiris Edwards AVR June 2024 - [facility]   Arthritis    Cervical incompetence    Dyspnea    GERD (gastroesophageal reflux disease)    Hay fever    Heart block    [employer] DCPPM on 10/26/2022 with Dr. Saturnino - [facility]   Hemochromatosis    HOCM (hypertrophic obstructive cardiomyopathy) (HCC)    Septal myomectomy  June 2024 - [facility]   Hypertension    Migraines    Mitral stenosis    25 mm Regent mechanical MVR June 2024 - [facility]   Paroxysmal atrial fibrillation (HCC)    Presence of permanent cardiac pacemaker    Sciatica    Type 2 diabetes mellitus (HCC)     Assessment/Plan: 2 Days Post-Op Procedure(s) (LRB): CONVERSION, PREVIOUS HIP SURGERY, TO TOTAL HIP ARTHROPLASTY (Right) REMOVAL, HARDWARE (Right) Principal Problem:   S/P total right hip arthroplasty  Estimated body mass index is 35.43 kg/m as calculated from the following:   Height as of this encounter: 5' 8 (1.727 m).   Weight as of this encounter: 105.7 kg. Advance diet Up with therapy D/C IV fluids  DVT Prophylaxis - Coumadin  PWB 50%  Hgb stable at 7.9 this AM  Up with PT today She does need to make more progress in terms of mobility / stairs / maintaining precautions in order to be safe at home Anticipate she may be here another few days  Rosina Calin, PA-C Orthopedic Surgery [phone] 02/09/2024, 7:34 AM

## 2024-02-09 NOTE — Progress Notes (Signed)
 Call received by RN, patient was ambulating to the bathroom around 6:00pm she felt a pop in her right hip with some pain but she was able to make it back to the bed. She was still having pain on call made aware. Nurse does not note any shortening or ER, she can dorsi/plantar flex. Right hip x-rays ordered.

## 2024-02-09 NOTE — Plan of Care (Signed)

## 2024-02-09 NOTE — Progress Notes (Signed)
 PHARMACY - ANTICOAGULATION CONSULT NOTE  Pharmacy Consult for warfarin Indication: mech MVR, valvular Afib  Allergies  Allergen Reactions   Nystatin  Other (See Comments)    unknown   Other Other (See Comments)    Product Containing 3-Hydroxy-3-Methylglutaryl-Coenzyme A Reductase Inhibitor (Product) unknown   Statins Other (See Comments)    Muscles aches,hurt    Patient Measurements: Height: 5' 8 (172.7 cm) Weight: 105.7 kg (233 lb 0.4 oz) IBW/kg (Calculated) : 63.9 HEPARIN  DW (KG): 87.6  Vital Signs: Temp: 97.5 F (36.4 C) (10/16 0559) Temp Source: Oral (10/16 0559) BP: 127/58 (10/16 0559) Pulse Rate: 59 (10/16 0559)  Labs: Recent Labs    02/07/24 1402 02/08/24 0323 02/09/24 0322  HGB  --  8.1* 7.9*  HCT  --  26.0* 26.2*  PLT  --  230 253  LABPROT 17.3* 17.9* 22.5*  INR 1.3* 1.4* 1.9*  CREATININE  --  1.48*  --     Estimated Creatinine Clearance: 41.8 mL/min (A) (by C-G formula based on SCr of 1.48 mg/dL (H)).   Medical History: Past Medical History:  Diagnosis Date   Aortic stenosis    21 mm Inspiris Edwards AVR June 2024 - [facility]   Arthritis    Cervical incompetence    Dyspnea    GERD (gastroesophageal reflux disease)    Hay fever    Heart block    [employer] DCPPM on 10/26/2022 with Dr. Saturnino - [facility]   Hemochromatosis    HOCM (hypertrophic obstructive cardiomyopathy) (HCC)    Septal myomectomy June 2024 - [facility]   Hypertension    Migraines    Mitral stenosis    25 mm [employer]ent mechanical MVR June 2024 - [facility]   Paroxysmal atrial fibrillation (HCC)    Presence of permanent cardiac pacemaker    Sciatica    Type 2 diabetes mellitus (HCC)     Medications:  Medications Prior to Admission  Medication Sig Dispense Refill Last Dose/Taking   acetaminophen  (TYLENOL ) 325 MG tablet Take 2 tablets (650 mg total) by mouth every 6 (six) hours as needed for mild pain (pain score 1-3) (or Fever >/= 101).   Past Week   amiodarone  (PACERONE ) 200 MG tablet  TAKE 1 TABLET BY MOUTH TWICE DAILY X3 WEEKS THEN 1 TABLET EVERY DAY THERAFTER. (Patient taking differently: Take 200 mg by mouth in the morning.) 90 tablet 1 02/07/2024   enoxaparin (LOVENOX) 100 MG/ML injection Inject 1 mL (100 mg total) into the skin every 12 (twelve) hours. 10 mL 1 02/06/2024 at  9:30 AM   ezetimibe  (ZETIA ) 10 MG tablet Take 10 mg by mouth every morning.   02/07/2024   losartan  (COZAAR ) 50 MG tablet Take 1 tablet (50 mg total) by mouth daily. 90 tablet 3 02/06/2024   magnesium  oxide (MAG-OX) 400 (240 Mg) MG tablet Take 400 mg by mouth in the morning.   Past Week   MELATONIN GUMMIES PO Take 10 mg by mouth at bedtime.   Past Week   metFORMIN  (GLUCOPHAGE ) 1000 MG tablet Take 1,000 mg by mouth 2 (two) times daily.   02/06/2024   metoprolol  succinate (TOPROL -XL) 25 MG 24 hr tablet Take 1 tablet (25 mg total) by mouth 2 (two) times daily. 180 tablet 3 02/07/2024   nitrofurantoin  (MACRODANTIN ) 50 MG capsule Take 50 mg by mouth in the morning.   02/06/2024   omeprazole (PRILOSEC) 20 MG capsule Take 20 mg by mouth daily before breakfast.  3 02/07/2024   warfarin (COUMADIN ) 2.5 MG tablet TAKE 1  TABLET BY MOUTH DAILY. EXCEPT 1/2 TABLET ON MONDAYS AND THURSDAYS OR AS DIRECTED 90 tablet 1 02/01/2024   Scheduled:   acetaminophen   1,000 mg Oral Q6H   amiodarone   200 mg Oral q AM   enoxaparin (LOVENOX) injection  100 mg Subcutaneous BID   ezetimibe   10 mg Oral q morning   insulin  aspart  0-15 Units Subcutaneous TID WC   insulin  aspart  0-5 Units Subcutaneous QHS   losartan   50 mg Oral Daily   melatonin  10 mg Oral QHS   metFORMIN   1,000 mg Oral BID WC   metoprolol  succinate  25 mg Oral BID   nitrofurantoin   50 mg Oral q AM   pantoprazole   40 mg Oral Daily   polyethylene glycol  17 g Oral BID   senna  2 tablet Oral QHS   Warfarin - Pharmacist Dosing Inpatient   Does not apply q1600   PRN: alum & mag hydroxide-simeth, bisacodyl , diphenhydrAMINE , HYDROmorphone  (DILAUDID ) injection,  menthol  **OR** phenol, methocarbamol  **OR** methocarbamol  (ROBAXIN ) injection, metoCLOPramide  **OR** metoCLOPramide  (REGLAN ) injection, ondansetron  **OR** ondansetron  (ZOFRAN ) IV, oxyCODONE , oxyCODONE    Assessment: 66 yoF with PMH HOCM and aortic stenosis s/p bioprosthetic AVR, mechanical MVR on warfarin with INR goal 2.5-3.5; Afib, heart block s/p PPM, HTN, HLD, DM2, admitted 10/14 for total hip arthroplasty. Bridged preoperatively per her Coumadin  clinic with therapeutic Lovenox following schedule below. Pharmacy to coordinate anticoagulation postoperatively through to discharge.   Bridging instructions per Coumadin  Clinic: 10/8  Last dose of warfarin 10/9  No Lovenox or warfarin 10/10 - 10/12  Lovenox 100mg  sq at 7am & 7pm 10/13  Lovenox 100mg  sq at 7am ----------No Lovenox in PM 10/14  No Lovenox in AM --------surgery-------admit to hospital.  Follow hospital instructions at discharge.  Noted procedure was completed late in the day (~18:00), and per Ortho PA, EBL ~1100 ml (significant). Will plan to start warfarin 10/14 PM and resume therapeutic Lovenox 10/15 AM  Baseline INR subtherapeutic as expected after holding warfarin for nearly a week Prior anticoagulation: warfarin 1.25 mg on Mon & Thurs; 2.5 mg all other days Chronically on amiodarone  200 mg daily (continued perioperatively)  Significant events:  Today, 02/09/2024: CBC: hgb 7.9 (low), plts 253 (WNL) INR 1.9 - low but improved from yesterday (1.4) Major drug interactions: amiodarone  (chronic) No bleeding issues documented 10/14 operative EBL ~1100 ml per PA Regular diet ordered; ate 100% of meal 10/15 lunch  Goal of Therapy: INR 2.5 - 3.5  Plan: Warfarin 2.5 mg PO tonight x 1 Continue Lovenox 100 mg SQ bid Daily INR CBC at least q72 hr while on warfarin Monitor for signs of bleeding or thrombosis   Lacinda Moats, PharmD Clinical Pharmacist  10/16/20257:26 AM

## 2024-02-10 ENCOUNTER — Ambulatory Visit: Payer: Medicare Other

## 2024-02-10 DIAGNOSIS — I442 Atrioventricular block, complete: Secondary | ICD-10-CM

## 2024-02-10 LAB — CBC
HCT: 25 % — ABNORMAL LOW (ref 36.0–46.0)
Hemoglobin: 7.4 g/dL — ABNORMAL LOW (ref 12.0–15.0)
MCH: 28.2 pg (ref 26.0–34.0)
MCHC: 29.6 g/dL — ABNORMAL LOW (ref 30.0–36.0)
MCV: 95.4 fL (ref 80.0–100.0)
Platelets: 310 K/uL (ref 150–400)
RBC: 2.62 MIL/uL — ABNORMAL LOW (ref 3.87–5.11)
RDW: 13.9 % (ref 11.5–15.5)
WBC: 17 K/uL — ABNORMAL HIGH (ref 4.0–10.5)
nRBC: 0 % (ref 0.0–0.2)

## 2024-02-10 LAB — GLUCOSE, CAPILLARY
Glucose-Capillary: 177 mg/dL — ABNORMAL HIGH (ref 70–99)
Glucose-Capillary: 181 mg/dL — ABNORMAL HIGH (ref 70–99)
Glucose-Capillary: 172 mg/dL — ABNORMAL HIGH (ref 70–99)
Glucose-Capillary: 149 mg/dL — ABNORMAL HIGH (ref 70–99)

## 2024-02-10 LAB — PROTIME-INR
INR: 2 — ABNORMAL HIGH (ref 0.8–1.2)
Prothrombin Time: 23.6 s — ABNORMAL HIGH (ref 11.4–15.2)

## 2024-02-10 LAB — HEMOGLOBIN AND HEMATOCRIT, BLOOD
HCT: 28.3 % — ABNORMAL LOW (ref 36.0–46.0)
Hemoglobin: 8.8 g/dL — ABNORMAL LOW (ref 12.0–15.0)

## 2024-02-10 LAB — PREPARE RBC (CROSSMATCH)

## 2024-02-10 MED ORDER — OXYCODONE HCL 5 MG PO TABS: 5.0000 mg | ORAL_TABLET | ORAL | 0 refills | Status: AC | PRN

## 2024-02-10 MED ORDER — WARFARIN SODIUM 4 MG PO TABS
4.0000 mg | ORAL_TABLET | Freq: Once | ORAL | Status: AC
  Administered 2024-02-10: 4 mg via ORAL
  Filled 2024-02-10: qty 1

## 2024-02-10 MED ORDER — SODIUM CHLORIDE 0.9% IV SOLUTION: Freq: Once | INTRAVENOUS | Status: AC

## 2024-02-10 MED ORDER — POLYETHYLENE GLYCOL 3350 17 G PO PACK: 17.0000 g | PACK | Freq: Two times a day (BID) | ORAL | 0 refills | Status: AC

## 2024-02-10 MED ORDER — METHOCARBAMOL 500 MG PO TABS: 500.0000 mg | ORAL_TABLET | Freq: Four times a day (QID) | ORAL | 2 refills | Status: AC | PRN

## 2024-02-10 MED ORDER — SENNA 8.6 MG PO TABS: 2.0000 | ORAL_TABLET | Freq: Every day | ORAL | 0 refills | Status: AC

## 2024-02-10 NOTE — Progress Notes (Signed)
   Subjective: 3 Days Post-Op Procedure(s) (LRB): CONVERSION, PREVIOUS HIP SURGERY, TO TOTAL HIP ARTHROPLASTY (Right) REMOVAL, HARDWARE (Right) Patient reports pain as mild.   Patient seen in rounds with Dr. Ernie. Patient is resting in bed on exam this morning. She felt a pop in her hip yesterday, but we reviewed x-rays that showed no concerns. No acute events otherwise. Still progressing slowly with PT. We will start therapy today.   Objective: Vital signs in last 24 hours: Temp:  [97.7 F (36.5 C)-98.2 F (36.8 C)] 98.2 F (36.8 C) (10/17 0510) Pulse Rate:  [59-63] 62 (10/17 0511) Resp:  [17-19] 19 (10/17 0510) BP: (116-124)/(51-60) 124/60 (10/17 0510) SpO2:  [91 %-99 %] 95 % (10/17 0511)  Intake/Output from previous day:  Intake/Output Summary (Last 24 hours) at 02/10/2024 0749 Last data filed at 02/10/2024 0600 Gross per 24 hour  Intake 1290 ml  Output --  Net 1290 ml     Intake/Output this shift: No intake/output data recorded.  Labs: Recent Labs    02/08/24 0323 02/09/24 0322 02/10/24 0316  HGB 8.1* 7.9* 7.4*   Recent Labs    02/09/24 0322 02/10/24 0316  WBC 18.0* 17.0*  RBC 2.76* 2.62*  HCT 26.2* 25.0*  PLT 253 310   Recent Labs    02/08/24 0323  NA 138  K 4.1  CL 102  CO2 26  BUN 18  CREATININE 1.48*  GLUCOSE 214*  CALCIUM 8.6*   Recent Labs    02/09/24 0322 02/10/24 0316  INR 1.9* 2.0*    Exam: General - Patient is Alert and Confused Extremity - Neurologically intact Sensation intact distally Intact pulses distally Dorsiflexion/Plantar flexion intact Dressing - dressing C/D/I Motor Function - intact, moving foot and toes well on exam.   Past Medical History:  Diagnosis Date   Aortic stenosis    21 mm Inspiris Edwards AVR June 2024 - [facility]   Arthritis    Cervical incompetence    Dyspnea    GERD (gastroesophageal reflux disease)    Hay fever    Heart block    [employer] DCPPM on 10/26/2022 with Dr. Saturnino - [facility]    Hemochromatosis    HOCM (hypertrophic obstructive cardiomyopathy) (HCC)    Septal myomectomy June 2024 - [facility]   Hypertension    Migraines    Mitral stenosis    25 mm Regent mechanical MVR June 2024 - [facility]   Paroxysmal atrial fibrillation (HCC)    Presence of permanent cardiac pacemaker    Sciatica    Type 2 diabetes mellitus (HCC)     Assessment/Plan: 3 Days Post-Op Procedure(s) (LRB): CONVERSION, PREVIOUS HIP SURGERY, TO TOTAL HIP ARTHROPLASTY (Right) REMOVAL, HARDWARE (Right) Principal Problem:   S/P total right hip arthroplasty  Estimated body mass index is 35.43 kg/m as calculated from the following:   Height as of this encounter: 5' 8 (1.727 m).   Weight as of this encounter: 105.7 kg. Advance diet Up with therapy  DVT Prophylaxis - Coumadin  PWB 50%  Hgb 7.4 this AM - will give 1 unit PRBC Anticipate she may be here for another day or two May d/c home once meeting goals with PT  Rosina Calin, PA-C Orthopedic Surgery [phone] 02/10/2024, 7:49 AM

## 2024-02-10 NOTE — Progress Notes (Signed)
 PT TX NOTE  02/10/24 1400  PT Visit Information  Last PT Received On 02/10/24  Assistance Needed Pt reports she feels very tired, began transfusion ~ 1 hr ago. Session focused UE/ RLE exercises; pt tol well, reports some incr pain with hip flexion, subsides with rest. Continue to follow in acute setting.   History of Present Illness 73 yo female s/p CONVERSION, PREVIOUS R HIP SURGERY, to THA on 02/08/24. PMH: heart block with pacemaker, afib, aortic stenosis, cardiomyopathy, HTN, DM2, R TKA, R RCR, obesity  Subjective Data  Patient Stated Goal home  Precautions  Precautions Fall;Posterior Hip  Recall of Precautions/Restrictions Impaired  Precaution/Restrictions Comments requires cues during mobility  Restrictions  RLE Weight Bearing Per Provider Order PWB  RLE Partial Weight Bearing Percentage or Pounds 50  Pain Assessment  Pain Assessment Faces  Faces Pain Scale 6  Pain Location right hip  Pain Descriptors / Indicators Discomfort;Sore;Grimacing;Guarding;Operative site guarding  Pain Intervention(s) Limited activity within patient's tolerance;Monitored during session;Premedicated before session;Repositioned;Ice applied  Cognition  Arousal Alert  Behavior During Therapy WFL for tasks assessed/performed  PT - Cognitive impairments No apparent impairments  Following Commands  Following commands Intact  Cueing  Cueing Techniques Verbal cues  Total Joint Exercises  Ankle Circles/Pumps AROM;Both;10 reps  Quad Sets AROM;Both;10 reps  Towel Squeeze AROM;Both;10 reps (used pillow)  Short [place] MILKS;Right;10 reps  Heel Slides AAROM;Left;10 reps  Hip ABduction/ADduction AAROM;Right;10 reps  Other Exercises  Other Exercises UEs (yellow tband) shoulder flexion, extension bil  x 10  PT - End of Session  Equipment Utilized During Treatment Gait belt  Activity Tolerance Patient tolerated treatment well  Patient left in bed;with call bell/phone within reach;with bed alarm set   PT -  Assessment/Plan  PT Visit Diagnosis Other abnormalities of gait and mobility (R26.89);History of falling (Z91.81)  PT Frequency (ACUTE ONLY) 7X/week  Follow Up Recommendations Follow physician's recommendations for discharge plan and follow up therapies (needs HHPT,plan HEP)  Patient can return home with the following Assist for transportation;Help with stairs or ramp for entrance;Assistance with cooking/housework;Two people to help with bathing/dressing/bathroom;Two people to help with walking and/or transfers  PT equipment None recommended by PT  AM-PAC PT 6 Clicks Mobility Outcome Measure (Version 2)  Help needed turning from your back to your side while in a flat bed without using bedrails? 2  Help needed moving from lying on your back to sitting on the side of a flat bed without using bedrails? 1  Help needed moving to and from a bed to a chair (including a wheelchair)? 2  Help needed standing up from a chair using your arms (e.g., wheelchair or bedside chair)? 2  Help needed to walk in hospital room? 2  Help needed climbing 3-5 steps with a railing?  1  6 Click Score 10  Consider Recommendation of Discharge To: CIR/SNF/LTACH  PT Goal Progression  Progress towards PT goals Progressing toward goals  Acute Rehab PT Goals  PT Goal Formulation With patient  Time For Goal Achievement 02/22/24  Potential to Achieve Goals Fair  PT Time Calculation  PT Start Time (ACUTE ONLY) 1415  PT Stop Time (ACUTE ONLY) 1429  PT Time Calculation (min) (ACUTE ONLY) 14 min  PT General Charges  $$ ACUTE PT VISIT 1 Visit  PT Treatments  $Therapeutic Exercise 8-22 mins

## 2024-02-10 NOTE — Progress Notes (Signed)
 PHARMACY - ANTICOAGULATION CONSULT NOTE  Pharmacy Consult for warfarin Indication: mech MVR, valvular Afib  Allergies  Allergen Reactions   Nystatin  Other (See Comments)    unknown   Other Other (See Comments)    Product Containing 3-Hydroxy-3-Methylglutaryl-Coenzyme A Reductase Inhibitor (Product) unknown   Statins Other (See Comments)    Muscles aches,hurt    Patient Measurements: Height: 5' 8 (172.7 cm) Weight: 105.7 kg (233 lb 0.4 oz) IBW/kg (Calculated) : 63.9 HEPARIN  DW (KG): 87.6  Vital Signs: Temp: 98.2 F (36.8 C) (10/17 0510) Temp Source: Oral (10/17 0510) BP: 124/60 (10/17 0510) Pulse Rate: 62 (10/17 0511)  Labs: Recent Labs    02/08/24 0323 02/09/24 0322 02/10/24 0316  HGB 8.1* 7.9* 7.4*  HCT 26.0* 26.2* 25.0*  PLT 230 253 310  LABPROT 17.9* 22.5* 23.6*  INR 1.4* 1.9* 2.0*  CREATININE 1.48*  --   --     Estimated Creatinine Clearance: 41.8 mL/min (A) (by C-G formula based on SCr of 1.48 mg/dL (H)).   Medical History: Past Medical History:  Diagnosis Date   Aortic stenosis    21 mm Inspiris Edwards AVR June 2024 - [facility]   Arthritis    Cervical incompetence    Dyspnea    GERD (gastroesophageal reflux disease)    Hay fever    Heart block    [employer] DCPPM on 10/26/2022 with Dr. Saturnino - [facility]   Hemochromatosis    HOCM (hypertrophic obstructive cardiomyopathy) (HCC)    Septal myomectomy June 2024 - [facility]   Hypertension    Migraines    Mitral stenosis    25 mm [employer] mechanical MVR June 2024 - [facility]   Paroxysmal atrial fibrillation (HCC)    Presence of permanent cardiac pacemaker    Sciatica    Type 2 diabetes mellitus (HCC)     Medications:  Medications Prior to Admission  Medication Sig Dispense Refill Last Dose/Taking   acetaminophen  (TYLENOL ) 325 MG tablet Take 2 tablets (650 mg total) by mouth every 6 (six) hours as needed for mild pain (pain score 1-3) (or Fever >/= 101).   Past Week   amiodarone  (PACERONE ) 200 MG tablet  TAKE 1 TABLET BY MOUTH TWICE DAILY X3 WEEKS THEN 1 TABLET EVERY DAY THERAFTER. (Patient taking differently: Take 200 mg by mouth in the morning.) 90 tablet 1 02/07/2024   enoxaparin (LOVENOX) 100 MG/ML injection Inject 1 mL (100 mg total) into the skin every 12 (twelve) hours. 10 mL 1 02/06/2024 at  9:30 AM   ezetimibe  (ZETIA ) 10 MG tablet Take 10 mg by mouth every morning.   02/07/2024   losartan  (COZAAR ) 50 MG tablet Take 1 tablet (50 mg total) by mouth daily. 90 tablet 3 02/06/2024   magnesium  oxide (MAG-OX) 400 (240 Mg) MG tablet Take 400 mg by mouth in the morning.   Past Week   MELATONIN GUMMIES PO Take 10 mg by mouth at bedtime.   Past Week   metFORMIN  (GLUCOPHAGE ) 1000 MG tablet Take 1,000 mg by mouth 2 (two) times daily.   02/06/2024   metoprolol  succinate (TOPROL -XL) 25 MG 24 hr tablet Take 1 tablet (25 mg total) by mouth 2 (two) times daily. 180 tablet 3 02/07/2024   nitrofurantoin  (MACRODANTIN ) 50 MG capsule Take 50 mg by mouth in the morning.   02/06/2024   omeprazole (PRILOSEC) 20 MG capsule Take 20 mg by mouth daily before breakfast.  3 02/07/2024   warfarin (COUMADIN ) 2.5 MG tablet TAKE 1 TABLET BY MOUTH DAILY. EXCEPT 1/2  TABLET ON MONDAYS AND THURSDAYS OR AS DIRECTED 90 tablet 1 02/01/2024   Scheduled:   sodium chloride    Intravenous Once   acetaminophen   1,000 mg Oral Q6H   amiodarone   200 mg Oral q AM   enoxaparin (LOVENOX) injection  100 mg Subcutaneous BID   ezetimibe   10 mg Oral q morning   insulin  aspart  0-15 Units Subcutaneous TID WC   insulin  aspart  0-5 Units Subcutaneous QHS   losartan   50 mg Oral Daily   melatonin  10 mg Oral QHS   metFORMIN   1,000 mg Oral BID WC   metoprolol  succinate  25 mg Oral BID   nitrofurantoin   50 mg Oral q AM   pantoprazole   40 mg Oral Daily   polyethylene glycol  17 g Oral BID   senna  2 tablet Oral QHS   Warfarin - Pharmacist Dosing Inpatient   Does not apply q1600   PRN: alum & mag hydroxide-simeth, bisacodyl , diphenhydrAMINE ,  HYDROmorphone  (DILAUDID ) injection, menthol  **OR** phenol, methocarbamol  **OR** methocarbamol  (ROBAXIN ) injection, metoCLOPramide  **OR** metoCLOPramide  (REGLAN ) injection, ondansetron  **OR** ondansetron  (ZOFRAN ) IV, oxyCODONE , oxyCODONE    Assessment: 29 yoF with PMH HOCM and aortic stenosis s/p bioprosthetic AVR, mechanical MVR on warfarin with INR goal 2.5-3.5; Afib, heart block s/p PPM, HTN, HLD, DM2, admitted 10/14 for total hip arthroplasty. Bridged preoperatively per her Coumadin  clinic with therapeutic Lovenox following schedule below. Pharmacy to coordinate anticoagulation postoperatively through to discharge.   Bridging instructions per Coumadin  Clinic: 10/8  Last dose of warfarin 10/9  No Lovenox or warfarin 10/10 - 10/12  Lovenox 100mg  sq at 7am & 7pm 10/13  Lovenox 100mg  sq at 7am ----------No Lovenox in PM 10/14  No Lovenox in AM --------surgery-------admit to hospital.  Follow hospital instructions at discharge.  Noted procedure was completed late in the day (~18:00), and per Ortho PA, EBL ~1100 ml (significant). Will plan to start warfarin 10/14 PM and resume therapeutic Lovenox 10/15 AM  Baseline INR subtherapeutic as expected after holding warfarin for nearly a week Prior anticoagulation: warfarin 1.25 mg on Mon & Thurs; 2.5 mg all other days Chronically on amiodarone  200 mg daily (continued perioperatively)  Significant events:  Today, 02/10/2024: CBC: hgb 7.4 (low) - transfusing 1 unit, plts 310 (WNL) INR 2.0 - low but improved from yesterday (1.9) and trending up yesterday's warfarin dose = 2.5 mg Major drug interactions: amiodarone  (chronic) No bleeding issues documented 10/14 operative EBL ~1100 ml per PA 10/17 no bleeding on the dressing per nurse Regular diet ordered; ate 80%-100% of meals  Goal of Therapy: INR 2.5 - 3.5  Plan: Warfarin 4.0 mg PO x1 today Continue Lovenox 100 mg SQ bid Daily INR CBC at least q72 hr while on warfarin Monitor for signs of  bleeding or thrombosis   Owens Cowing, PharmD Candidate 10/17/20259:38 AM

## 2024-02-10 NOTE — Progress Notes (Signed)
 Physical Therapy Treatment Patient Details Name: [name] MRN: [id number] DOB: 1951-04-24 Today's Date: 02/10/2024   History of Present Illness 73 yo female s/p CONVERSION, PREVIOUS R HIP SURGERY, to THA on 02/08/24. PMH: heart block with pacemaker, afib, aortic stenosis, cardiomyopathy, HTN, DM2, R TKA, R RCR, obesity    PT Comments  Pt is making slow progress, fatigue continues to be limiting factor--Hgb 7.4 this am, pt to receive one unit of blood today.  Pt requires min - mod assist of 2 for transfers, fatigues quickly with amb attempts. Continue PT POC in acute setting. Pt would benefit from HHPT   If plan is discharge home, recommend the following: Assist for transportation;Help with stairs or ramp for entrance;Assistance with cooking/housework;Two people to help with bathing/dressing/bathroom;Two people to help with walking and/or transfers   Can travel by private vehicle        Equipment Recommendations  None recommended by PT    Recommendations for Other Services       Precautions / Restrictions Precautions Precautions: Fall;Posterior Hip Recall of Precautions/Restrictions: Impaired Restrictions RLE Weight Bearing Per Provider Order: Partial weight bearing RLE Partial Weight Bearing Percentage or Pounds: 50     Mobility  Bed Mobility               General bed mobility comments: in recliner and returned to same    Transfers Overall transfer level: Needs assistance Equipment used: Rolling walker (2 wheels) Transfers: Sit to/from Stand, Bed to chair/wheelchair/BSC Sit to Stand: Mod assist, +2 physical assistance, +2 safety/equipment, Min assist   Step pivot transfers: Min assist, +2 physical assistance, +2 safety/equipment       General transfer comment: multi-modal cues for hand placement, THP, RLE position; assist to rise and transition to RW. cues for sequencing for stand pivot, assist to maneuver RW and advance RLE at times     Ambulation/Gait Ambulation/Gait assistance: Min assist, Mod assist, +2 physical assistance, +2 safety/equipment Gait Distance (Feet): 4 Feet Assistive device: Rolling walker (2 wheels) Gait Pattern/deviations: Step-to pattern, Wide base of support, Trunk flexed, Antalgic, Decreased stance time - right Gait velocity: decr     General Gait Details: multi-modal cues for use of UEs,  RW and RLE position. intermittent assist to advance  RLE   Stairs             Wheelchair Mobility     Tilt Bed    Modified Rankin (Stroke Patients Only)       Balance Overall balance assessment: Needs assistance Sitting-balance support: Feet supported, No upper extremity supported Sitting balance-Leahy Scale: Fair Sitting balance - Comments: body habitus, pain and weakness limit wt shifting   Standing balance support: Reliant on assistive device for balance, During functional activity Standing balance-Leahy Scale: Poor                              Communication Communication Communication: No apparent difficulties  Cognition Arousal: Alert Behavior During Therapy: WFL for tasks assessed/performed   PT - Cognitive impairments: No apparent impairments                         Following commands: Intact      Cueing Cueing Techniques: Verbal cues, Tactile cues, Visual cues  Exercises      General Comments        Pertinent Vitals/Pain Pain Assessment Pain Assessment: Faces Faces Pain Scale: Hurts little more Pain  Location: right hip Pain Descriptors / Indicators: Discomfort, Sore, Grimacing, Guarding, Operative site guarding Pain Intervention(s): Limited activity within patient's tolerance, Monitored during session, Premedicated before session, Repositioned    Home Living                          Prior Function            PT Goals (current goals can now be found in the care plan section) Acute Rehab PT Goals Patient Stated Goal:  home PT Goal Formulation: With patient Time For Goal Achievement: 02/22/24 Potential to Achieve Goals: Fair Progress towards PT goals: Progressing toward goals    Frequency    7X/week      PT Plan      Co-evaluation              AM-PAC PT 6 Clicks Mobility   Outcome Measure  Help needed turning from your back to your side while in a flat bed without using bedrails?: A Lot Help needed moving from lying on your back to sitting on the side of a flat bed without using bedrails?: Total Help needed moving to and from a bed to a chair (including a wheelchair)?: A Lot Help needed standing up from a chair using your arms (e.g., wheelchair or bedside chair)?: A Lot Help needed to walk in hospital room?: A Lot Help needed climbing 3-5 steps with a railing? : A Lot 6 Click Score: 11    End of Session Equipment Utilized During Treatment: Gait belt Activity Tolerance: Patient limited by fatigue Patient left: in chair;with call bell/phone within reach;with chair alarm set Nurse Communication: Mobility status PT Visit Diagnosis: Other abnormalities of gait and mobility (R26.89);History of falling (Z91.81)     Time: 8960-8896 PT Time Calculation (min) (ACUTE ONLY): 24 min  Charges:    $Gait Training: 8-22 mins $Therapeutic Activity: 8-22 mins PT General Charges $$ ACUTE PT VISIT: 1 Visit                     Adelena Desantiago, PT  Acute Rehab Dept [facility]) [phone]  02/10/2024    [facility] 02/10/2024, 11:14 AM

## 2024-02-10 NOTE — Plan of Care (Signed)
  Problem: Education: Goal: Knowledge of General Education information will improve Description: Including pain rating scale, medication(s)/side effects and non-pharmacologic comfort measures Outcome: Progressing   Problem: Health Behavior/Discharge Planning: Goal: Ability to manage health-related needs will improve Outcome: Progressing   Problem: Clinical Measurements: Goal: Ability to maintain clinical measurements within normal limits will improve Outcome: Progressing Goal: Will remain free from infection Outcome: Progressing Goal: Diagnostic test results will improve Outcome: Progressing Goal: Respiratory complications will improve Outcome: Progressing Goal: Cardiovascular complication will be avoided Outcome: Progressing   Problem: Activity: Goal: Risk for activity intolerance will decrease Outcome: Progressing   Problem: Nutrition: Goal: Adequate nutrition will be maintained Outcome: Progressing   Problem: Coping: Goal: Level of anxiety will decrease Outcome: Progressing   Problem: Elimination: Goal: Will not experience complications related to bowel motility Outcome: Progressing Goal: Will not experience complications related to urinary retention Outcome: Progressing   Problem: Pain Managment: Goal: General experience of comfort will improve and/or be controlled Outcome: Progressing   Problem: Safety: Goal: Ability to remain free from injury will improve Outcome: Progressing   Problem: Skin Integrity: Goal: Risk for impaired skin integrity will decrease Outcome: Progressing   Problem: Education: Goal: Ability to describe self-care measures that may prevent or decrease complications (Diabetes Survival Skills Education) will improve Outcome: Progressing Goal: Individualized Educational Video(s) Outcome: Progressing   Problem: Coping: Goal: Ability to adjust to condition or change in health will improve Outcome: Progressing   Problem: Fluid  Volume: Goal: Ability to maintain a balanced intake and output will improve Outcome: Progressing   Problem: Health Behavior/Discharge Planning: Goal: Ability to identify and utilize available resources and services will improve Outcome: Progressing Goal: Ability to manage health-related needs will improve Outcome: Progressing   Problem: Metabolic: Goal: Ability to maintain appropriate glucose levels will improve Outcome: Progressing   Problem: Nutritional: Goal: Maintenance of adequate nutrition will improve Outcome: Progressing Goal: Progress toward achieving an optimal weight will improve Outcome: Progressing   Problem: Skin Integrity: Goal: Risk for impaired skin integrity will decrease Outcome: Progressing   Problem: Tissue Perfusion: Goal: Adequacy of tissue perfusion will improve Outcome: Progressing   Problem: Education: Goal: Knowledge of the prescribed therapeutic regimen will improve Outcome: Progressing Goal: Understanding of discharge needs will improve Outcome: Progressing Goal: Individualized Educational Video(s) Outcome: Progressing   Problem: Activity: Goal: Ability to avoid complications of mobility impairment will improve Outcome: Progressing Goal: Ability to tolerate increased activity will improve Outcome: Progressing   Problem: Clinical Measurements: Goal: Postoperative complications will be avoided or minimized Outcome: Progressing   Problem: Pain Management: Goal: Pain level will decrease with appropriate interventions Outcome: Progressing   Problem: Skin Integrity: Goal: Will show signs of wound healing Outcome: Progressing

## 2024-02-11 LAB — PROTIME-INR
INR: 2.1 — ABNORMAL HIGH (ref 0.8–1.2)
Prothrombin Time: 24.7 s — ABNORMAL HIGH (ref 11.4–15.2)

## 2024-02-11 LAB — GLUCOSE, CAPILLARY
Glucose-Capillary: 106 mg/dL — ABNORMAL HIGH (ref 70–99)
Glucose-Capillary: 211 mg/dL — ABNORMAL HIGH (ref 70–99)
Glucose-Capillary: 174 mg/dL — ABNORMAL HIGH (ref 70–99)
Glucose-Capillary: 157 mg/dL — ABNORMAL HIGH (ref 70–99)

## 2024-02-11 MED ORDER — WARFARIN SODIUM 6 MG PO TABS
6.0000 mg | ORAL_TABLET | Freq: Once | ORAL | Status: AC
  Administered 2024-02-11: 6 mg via ORAL
  Filled 2024-02-11: qty 1

## 2024-02-11 NOTE — Plan of Care (Signed)
  Problem: Clinical Measurements: Goal: Will remain free from infection Outcome: Progressing   Problem: Nutrition: Goal: Adequate nutrition will be maintained Outcome: Progressing   Problem: Coping: Goal: Level of anxiety will decrease Outcome: Progressing   Problem: Pain Managment: Goal: General experience of comfort will improve and/or be controlled Outcome: Progressing   Problem: Safety: Goal: Ability to remain free from injury will improve Outcome: Progressing   Problem: Coping: Goal: Ability to adjust to condition or change in health will improve Outcome: Progressing

## 2024-02-11 NOTE — Progress Notes (Signed)
 Physical Therapy Treatment Patient Details Name: [name] MRN: [id number] DOB: [birth year] Today's Date: 02/11/2024   History of Present Illness 73 yo female s/p CONVERSION, PREVIOUS R HIP SURGERY, to THA on 02/08/24. PMH: heart block with pacemaker, afib, aortic stenosis, cardiomyopathy, HTN, DM2, R TKA, R RCR, obesity    PT Comments  Pt agreeable to working with therapy. She continues to require +2 assist for safe mobility. She is at risk for falls when mobilizing. She requires reminders for adherence to hip precautions and WB status. Ambulation distance remains limited 2* weakness, fatigue/decreased activity tolerance. Will continue to progress activity as safely able. Pt would benefit from a short term rehab stay however plan is for home. PT recommends HHPT f/u instead of HEP.     If plan is discharge home, recommend the following: Assist for transportation;Help with stairs or ramp for entrance;Assistance with cooking/housework;Two people to help with bathing/dressing/bathroom;Two people to help with walking and/or transfers   Can travel by private vehicle        Equipment Recommendations  None recommended by PT    Recommendations for Other Services       Precautions / Restrictions Precautions Precautions: Fall;Posterior Hip Recall of Precautions/Restrictions: Impaired Precaution/Restrictions Comments: requires cues during mobility. recalls 1/3 hip precautions Restrictions Weight Bearing Restrictions Per Provider Order: Yes RLE Weight Bearing Per Provider Order: Partial weight bearing RLE Partial Weight Bearing Percentage or Pounds: 50%     Mobility  Bed Mobility Overal bed mobility: Needs Assistance Bed Mobility: Supine to Sit     Supine to sit: Mod assist, +2 for safety/equipment, HOB elevated, Used rails     General bed mobility comments: Increased time. Cues for safety,technique, seqeuencing, adherence to hip precautions. Assist for bil LEs.     Transfers Overall transfer level: Needs assistance Equipment used: Rolling walker (2 wheels) Transfers: Sit to/from Stand Sit to Stand: Mod assist, +2 physical assistance, +2 safety/equipment           General transfer comment: x2. Once from elevated bed, once from lower recliner. Increased time. Cues for safety, technique, hand/LE placement. Assist to power up, stabilize, control descent.    Ambulation/Gait Ambulation/Gait assistance: Min assist, +2 physical assistance, +2 safety/equipment Gait Distance (Feet): 5 Feet (x2) Assistive device: Rolling walker (2 wheels) Gait Pattern/deviations: Step-to pattern, Wide base of support, Trunk flexed, Antalgic, Decreased stance time - right       General Gait Details: Cues for safety, sequencing, posture, adherence to WB status, proper use of RW, and for pt to take steps instead of shuffling. Pt fatigues very quickly. Close follow with recliner. Seated rest break required. Fall risk. HR and O2 ok. Dyspnea 3/4. Switched to [employer] at pt's request due to pt's feet blocking legs of RW when attempting to advance forward (wide BOS).    Stairs             Wheelchair Mobility     Tilt Bed    Modified Rankin (Stroke Patients Only)       Balance Overall balance assessment: Needs assistance         Standing balance support: Reliant on assistive device for balance, During functional activity, Bilateral upper extremity supported Standing balance-Leahy Scale: Poor                              Communication Communication Communication: No apparent difficulties  Cognition Arousal: Alert Behavior During Therapy: WFL for tasks assessed/performed  PT - Cognitive impairments: No apparent impairments                         Following commands: Intact      Cueing Cueing Techniques: Verbal cues  Exercises Total Joint Exercises Ankle Circles/Pumps: AROM, Both, 10 reps Quad Sets: AROM, Both, 10  reps Heel Slides: AAROM, Right, 10 reps Hip ABduction/ADduction: AAROM, Right, 10 reps    General Comments        Pertinent Vitals/Pain Pain Assessment Pain Assessment: Faces Faces Pain Scale: Hurts even more Pain Location: right hip Pain Descriptors / Indicators: Discomfort, Sore, Grimacing, Guarding, Operative site guarding Pain Intervention(s): Limited activity within patient's tolerance, Monitored during session, Repositioned    Home Living                          Prior Function            PT Goals (current goals can now be found in the care plan section) Progress towards PT goals: Progressing toward goals    Frequency    7X/week      PT Plan      Co-evaluation              AM-PAC PT 6 Clicks Mobility   Outcome Measure  Help needed turning from your back to your side while in a flat bed without using bedrails?: A Lot Help needed moving from lying on your back to sitting on the side of a flat bed without using bedrails?: A Lot Help needed moving to and from a bed to a chair (including a wheelchair)?: A Lot Help needed standing up from a chair using your arms (e.g., wheelchair or bedside chair)?: A Lot Help needed to walk in hospital room?: A Lot Help needed climbing 3-5 steps with a railing? : Total 6 Click Score: 11    End of Session Equipment Utilized During Treatment: Gait belt Activity Tolerance: Patient tolerated treatment well Patient left: in chair;with call bell/phone within reach;with chair alarm set   PT Visit Diagnosis: Other abnormalities of gait and mobility (R26.89);History of falling (Z91.81)     Time: 0940-1006 PT Time Calculation (min) (ACUTE ONLY): 26 min  Charges:    $Gait Training: 8-22 mins $Therapeutic Exercise: 8-22 mins PT General Charges $$ ACUTE PT VISIT: 1 Visit                     Dannial SQUIBB, PT Acute Rehabilitation  Office: [phone]

## 2024-02-11 NOTE — Progress Notes (Signed)
 PHARMACY - ANTICOAGULATION CONSULT NOTE  Pharmacy Consult for warfarin Indication: mech MVR, valvular Afib  Allergies  Allergen Reactions   Nystatin  Other (See Comments)    unknown   Other Other (See Comments)    Product Containing 3-Hydroxy-3-Methylglutaryl-Coenzyme A Reductase Inhibitor (Product) unknown   Statins Other (See Comments)    Muscles aches,hurt    Patient Measurements: Height: 5' 8 (172.7 cm) Weight: 105.7 kg (233 lb 0.4 oz) IBW/kg (Calculated) : 63.9 HEPARIN  DW (KG): 87.6  Vital Signs: Temp: 97.4 F (36.3 C) (10/18 0523) Temp Source: Oral (10/18 0523) BP: 154/69 (10/18 0523) Pulse Rate: 60 (10/18 0523)  Labs: Recent Labs    02/09/24 0322 02/10/24 0316 02/10/24 1859 02/11/24 0345  HGB 7.9* 7.4* 8.8*  --   HCT 26.2* 25.0* 28.3*  --   PLT 253 310  --   --   LABPROT 22.5* 23.6*  --  24.7*  INR 1.9* 2.0*  --  2.1*    Estimated Creatinine Clearance: 41.8 mL/min (A) (by C-G formula based on SCr of 1.48 mg/dL (H)).   Medical History: Past Medical History:  Diagnosis Date   Aortic stenosis    21 mm Inspiris Edwards AVR June 2024 - [facility]   Arthritis    Cervical incompetence    Dyspnea    GERD (gastroesophageal reflux disease)    Hay fever    Heart block    [employer] DCPPM on 10/26/2022 with Dr. Saturnino - [facility]   Hemochromatosis    HOCM (hypertrophic obstructive cardiomyopathy) (HCC)    Septal myomectomy June 2024 - [facility]   Hypertension    Migraines    Mitral stenosis    25 mm [employer] mechanical MVR June 2024 - [facility]   Paroxysmal atrial fibrillation (HCC)    Presence of permanent cardiac pacemaker    Sciatica    Type 2 diabetes mellitus (HCC)     Medications:  Medications Prior to Admission  Medication Sig Dispense Refill Last Dose/Taking   acetaminophen  (TYLENOL ) 325 MG tablet Take 2 tablets (650 mg total) by mouth every 6 (six) hours as needed for mild pain (pain score 1-3) (or Fever >/= 101).   Past Week   amiodarone  (PACERONE ) 200  MG tablet TAKE 1 TABLET BY MOUTH TWICE DAILY X3 WEEKS THEN 1 TABLET EVERY DAY THERAFTER. (Patient taking differently: Take 200 mg by mouth in the morning.) 90 tablet 1 02/07/2024   enoxaparin (LOVENOX) 100 MG/ML injection Inject 1 mL (100 mg total) into the skin every 12 (twelve) hours. 10 mL 1 02/06/2024 at  9:30 AM   ezetimibe  (ZETIA ) 10 MG tablet Take 10 mg by mouth every morning.   02/07/2024   losartan  (COZAAR ) 50 MG tablet Take 1 tablet (50 mg total) by mouth daily. 90 tablet 3 02/06/2024   magnesium  oxide (MAG-OX) 400 (240 Mg) MG tablet Take 400 mg by mouth in the morning.   Past Week   MELATONIN GUMMIES PO Take 10 mg by mouth at bedtime.   Past Week   metFORMIN  (GLUCOPHAGE ) 1000 MG tablet Take 1,000 mg by mouth 2 (two) times daily.   02/06/2024   metoprolol  succinate (TOPROL -XL) 25 MG 24 hr tablet Take 1 tablet (25 mg total) by mouth 2 (two) times daily. 180 tablet 3 02/07/2024   nitrofurantoin  (MACRODANTIN ) 50 MG capsule Take 50 mg by mouth in the morning.   02/06/2024   omeprazole (PRILOSEC) 20 MG capsule Take 20 mg by mouth daily before breakfast.  3 02/07/2024   warfarin (COUMADIN ) 2.5  MG tablet TAKE 1 TABLET BY MOUTH DAILY. EXCEPT 1/2 TABLET ON MONDAYS AND THURSDAYS OR AS DIRECTED 90 tablet 1 02/01/2024   Scheduled:   acetaminophen   1,000 mg Oral Q6H   amiodarone   200 mg Oral q AM   enoxaparin (LOVENOX) injection  100 mg Subcutaneous BID   ezetimibe   10 mg Oral q morning   insulin  aspart  0-15 Units Subcutaneous TID WC   insulin  aspart  0-5 Units Subcutaneous QHS   losartan   50 mg Oral Daily   melatonin  10 mg Oral QHS   metFORMIN   1,000 mg Oral BID WC   metoprolol  succinate  25 mg Oral BID   nitrofurantoin   50 mg Oral q AM   pantoprazole   40 mg Oral Daily   polyethylene glycol  17 g Oral BID   senna  2 tablet Oral QHS   Warfarin - Pharmacist Dosing Inpatient   Does not apply q1600   PRN: alum & mag hydroxide-simeth, bisacodyl , diphenhydrAMINE , HYDROmorphone  (DILAUDID )  injection, menthol  **OR** phenol, methocarbamol  **OR** methocarbamol  (ROBAXIN ) injection, metoCLOPramide  **OR** metoCLOPramide  (REGLAN ) injection, ondansetron  **OR** ondansetron  (ZOFRAN ) IV, oxyCODONE , oxyCODONE    Assessment: 74 yoF with PMH HOCM and aortic stenosis s/p bioprosthetic AVR, mechanical MVR on warfarin with INR goal 2.5-3.5; Afib, heart block s/p PPM, HTN, HLD, DM2, admitted 10/14 for total hip arthroplasty. Bridged preoperatively per her Coumadin  clinic with therapeutic Lovenox following schedule below. Pharmacy to coordinate anticoagulation postoperatively through to discharge.   Bridging instructions per Coumadin  Clinic: 10/8  Last dose of warfarin 10/9  No Lovenox or warfarin 10/10 - 10/12  Lovenox 100mg  sq at 7am & 7pm 10/13  Lovenox 100mg  sq at 7am ----------No Lovenox in PM 10/14  No Lovenox in AM --------surgery-------admit to hospital.  Follow hospital instructions at discharge.  Noted procedure was completed late in the day (~18:00), and per Ortho PA, EBL ~1100 ml (significant). Will plan to start warfarin 10/14 PM and resume therapeutic Lovenox 10/15 AM  Baseline INR subtherapeutic as expected after holding warfarin for nearly a week Prior anticoagulation: warfarin 1.25 mg on Mon & Thurs; 2.5 mg all other days Chronically on amiodarone  200 mg daily (continued perioperatively)  Significant events:  Today, 02/11/2024: CBC: hgb 7.4 (low) - transfusing 1 unit, plts 310 (WNL) INR 2.1 - low but improved from yesterday (2.1) and trending up yesterday's warfarin dose = 4 mg Major drug interactions: amiodarone  (chronic) No bleeding issues documented 10/14 operative EBL ~1100 ml per PA 10/17 no bleeding on the dressing per nurse Regular diet ordered; ate 80%-100% of meals  Goal of Therapy: INR 2.5 - 3.5  Plan: Warfarin 6 mg PO x1 today Continue Lovenox 100 mg SQ bid Daily INR CBC at least q72 hr while on warfarin Monitor for signs of bleeding or  thrombosis   Thank you for allowing pharmacy to be a part of this patient's care.  Eleanor EMERSON Agent, PharmD, BCPS Clinical Pharmacist [place] 02/11/2024 11:34 AM

## 2024-02-11 NOTE — Progress Notes (Signed)
 Physical Therapy Treatment Patient Details Name: [name] MRN: [id number] DOB: [birth year] Today's Date: 02/11/2024   History of Present Illness 73 yo female s/p CONVERSION, PREVIOUS R HIP SURGERY, to THA on 02/08/24. PMH: heart block with pacemaker, afib, aortic stenosis, cardiomyopathy, HTN, DM2, R TKA, R RCR, obesity    PT Comments  Pt agreeable to 2nd session. Pt continues to require +2 assistance for safe mobility. She walked ~5 feet x 2 this afternoon. Unfortunately, pt does not mobilize outside of therapy sessions. Encouraged pt to try to get up to [facility] with nursing and also asked nursing to try to get her up as well to increase mobility. Anticipate pt will remain in hospital for extended stay since plan is for home with HEP. PT recommendation is for HHPT f/u instead of HEP.     If plan is discharge home, recommend the following: Assist for transportation;Help with stairs or ramp for entrance;Assistance with cooking/housework;Two people to help with bathing/dressing/bathroom;Two people to help with walking and/or transfers   Can travel by private vehicle        Equipment Recommendations  None recommended by PT    Recommendations for Other Services       Precautions / Restrictions Precautions Precautions: Fall;Posterior Hip Precaution/Restrictions Comments: requires cues during mobility. recalls 1/3 hip precautions Restrictions Weight Bearing Restrictions Per Provider Order: Yes RLE Weight Bearing Per Provider Order: Partial weight bearing RLE Partial Weight Bearing Percentage or Pounds: 50%     Mobility  Bed Mobility Overal bed mobility: Needs Assistance Bed Mobility: Sit to Supine      Sit to supine: Mod assist, +2 for physical assistance, +2 for safety/equipment, HOB elevated, Used rails   General bed mobility comments: Increased time. Cues for safety,technique, seqeuencing, adherence to hip precautions. Assist for bil LEs.    Transfers Overall transfer level:  Needs assistance Equipment used: Rolling walker (2 wheels) Transfers: Sit to/from Stand Sit to Stand: Mod assist, +2 physical assistance, +2 safety/equipment           General transfer comment: x2. from lower recliner. Increased time. Cues for safety, technique, hand/LE placement. Assist to power up, stabilize, control descent.    Ambulation/Gait Ambulation/Gait assistance: Min assist, +2 physical assistance, +2 safety/equipment Gait Distance (Feet): 5 Feet (x2) Assistive device: Rolling walker (2 wheels) Gait Pattern/deviations: Step-to pattern, Wide base of support, Trunk flexed, Antalgic, Decreased stance time - right       General Gait Details: Cues for safety, sequencing, posture, adherence to WB status, proper use of RW, and for pt to take steps instead of shuffling. Pt fatigues very quickly-c/o dizziness. Close follow with recliner. Seated rest break required. Fall risk. HR and O2 ok. Dyspnea 3/4.   Stairs             Wheelchair Mobility     Tilt Bed    Modified Rankin (Stroke Patients Only)       Balance Overall balance assessment: Needs assistance         Standing balance support: Reliant on assistive device for balance, During functional activity, Bilateral upper extremity supported Standing balance-Leahy Scale: Poor                              Communication Communication Communication: No apparent difficulties  Cognition Arousal: Alert Behavior During Therapy: WFL for tasks assessed/performed   PT - Cognitive impairments: No apparent impairments  Following commands: Intact      Cueing Cueing Techniques: Verbal cues  Exercises Total Joint Exercises Ankle Circles/Pumps: AROM, Both, 10 reps Quad Sets: AROM, Both, 10 reps Heel Slides: AAROM, Right, 10 reps Hip ABduction/ADduction: AAROM, Right, 10 reps    General Comments        Pertinent Vitals/Pain Pain Assessment Pain Assessment:  Faces Faces Pain Scale: Hurts even more Pain Location: right hip Pain Descriptors / Indicators: Discomfort, Sore, Grimacing, Guarding, Operative site guarding Pain Intervention(s): Limited activity within patient's tolerance, Monitored during session, Repositioned, Ice applied    Home Living                          Prior Function            PT Goals (current goals can now be found in the care plan section) Progress towards PT goals: Progressing toward goals    Frequency    7X/week      PT Plan      Co-evaluation              AM-PAC PT 6 Clicks Mobility   Outcome Measure  Help needed turning from your back to your side while in a flat bed without using bedrails?: A Lot Help needed moving from lying on your back to sitting on the side of a flat bed without using bedrails?: A Lot Help needed moving to and from a bed to a chair (including a wheelchair)?: A Lot Help needed standing up from a chair using your arms (e.g., wheelchair or bedside chair)?: A Lot Help needed to walk in hospital room?: A Lot Help needed climbing 3-5 steps with a railing? : Total 6 Click Score: 11    End of Session Equipment Utilized During Treatment: Gait belt Activity Tolerance: Patient tolerated treatment well;Patient limited by fatigue Patient left: in bed;with call bell/phone within reach;with bed alarm set;with family/visitor present   PT Visit Diagnosis: Other abnormalities of gait and mobility (R26.89);History of falling (Z91.81)     Time: 8587-8564 PT Time Calculation (min) (ACUTE ONLY): 23 min  Charges:    $Gait Training: 23-37 mins PT General Charges $$ ACUTE PT VISIT: 1 Visit                       Dannial SQUIBB, PT Acute Rehabilitation  Office: [phone]

## 2024-02-11 NOTE — Progress Notes (Signed)
 Subjective: 4 Days Post-Op Procedure(s) (LRB): CONVERSION, PREVIOUS HIP SURGERY, TO TOTAL HIP ARTHROPLASTY (Right) REMOVAL, HARDWARE (Right)  Patient reports pain as appropriately controlled. Denies any new numbness/tingling.   Objective:   VITALS:  Temp:  [97.4 F (36.3 C)-98.5 F (36.9 C)] 97.4 F (36.3 C) (10/18 0523) Pulse Rate:  [59-61] 60 (10/18 0523) Resp:  [16-18] 16 (10/18 0523) BP: (114-166)/(55-73) 154/69 (10/18 0523) SpO2:  [93 %-96 %] 94 % (10/18 0523)  Neurovascular intact Sensation intact distally Intact pulses distally Dorsiflexion/Plantar flexion intact Incision: dressing C/D/I Compartment soft   LABS Recent Labs    02/09/24 0322 02/10/24 0316 02/10/24 1859  HGB 7.9* 7.4* 8.8*  WBC 18.0* 17.0*  --   PLT 253 310  --    No results for input(s): NA, K, CL, CO2, BUN, CREATININE, GLUCOSE in the last 72 hours. Recent Labs    02/10/24 0316 02/11/24 0345  INR 2.0* 2.1*     Assessment/Plan: 4 Days Post-Op Procedure(s) (LRB): CONVERSION, PREVIOUS HIP SURGERY, TO TOTAL HIP ARTHROPLASTY (Right) REMOVAL, HARDWARE (Right)  Advance diet Up with therapy   DVT Prophylaxis - Coumadin  PWB 50%   Hgb 8.8 this AM Anticipate she may be here for another day or two May d/c home once meeting goals with PT  Lillia Mountain 02/11/2024, 6:32 AM

## 2024-02-11 NOTE — Plan of Care (Signed)

## 2024-02-12 DIAGNOSIS — D62 Acute posthemorrhagic anemia: Secondary | ICD-10-CM | POA: Insufficient documentation

## 2024-02-12 LAB — GLUCOSE, CAPILLARY
Glucose-Capillary: 173 mg/dL — ABNORMAL HIGH (ref 70–99)
Glucose-Capillary: 196 mg/dL — ABNORMAL HIGH (ref 70–99)
Glucose-Capillary: 193 mg/dL — ABNORMAL HIGH (ref 70–99)
Glucose-Capillary: 178 mg/dL — ABNORMAL HIGH (ref 70–99)

## 2024-02-12 LAB — BPAM RBC
Blood Product Expiration Date: 202511182359
ISSUE DATE / TIME: 202510171256
Unit Type and Rh: 7300

## 2024-02-12 LAB — TYPE AND SCREEN
ABO/RH(D): B POS
Antibody Screen: NEGATIVE
Unit division: 0

## 2024-02-12 LAB — PROTIME-INR
INR: 2.5 — ABNORMAL HIGH (ref 0.8–1.2)
Prothrombin Time: 28.6 s — ABNORMAL HIGH (ref 11.4–15.2)

## 2024-02-12 MED ORDER — OXYCODONE HCL 5 MG PO TABS
5.0000 mg | ORAL_TABLET | ORAL | Status: DC | PRN
  Administered 2024-02-12 – 2024-02-13 (×2): 5 mg via ORAL
  Filled 2024-02-12 (×2): qty 1

## 2024-02-12 MED ORDER — WARFARIN SODIUM 6 MG PO TABS
6.0000 mg | ORAL_TABLET | Freq: Once | ORAL | Status: AC
  Administered 2024-02-12: 6 mg via ORAL
  Filled 2024-02-12: qty 1

## 2024-02-12 MED ORDER — OXYCODONE HCL 5 MG PO TABS
10.0000 mg | ORAL_TABLET | ORAL | Status: DC | PRN
  Administered 2024-02-12 – 2024-02-13 (×3): 10 mg via ORAL
  Filled 2024-02-12 (×3): qty 2

## 2024-02-12 NOTE — Progress Notes (Signed)
 Physical Therapy Treatment Patient Details Name: [name] MRN: [id number] DOB: November 02, 1950 Today's Date: 02/12/2024   History of Present Illness 73 yo female s/p CONVERSION, PREVIOUS R HIP SURGERY, to THA on 02/08/24. PMH: heart block with pacemaker, afib, aortic stenosis, cardiomyopathy, HTN, DM2, R TKA, R RCR, obesity    PT Comments  Pt agreeable to 2nd session. She walked ~7 feet with close recliner follow. Pt c/o dizziness during session: BP 189/54 after pivoting to BSC; 139/54 after short walk. Pt continues to require +2 assist for safe mobility. She is at risk for falls when mobilizing. Discussed d/c plan-pt is adamant about discharging home (does not want to go to a SNF). She is under the impression that [facility] will transport her home and get her into the house-recommended she discuss this with [facility] team. Will continue to follow and progress activity as tolerated.     If plan is discharge home, recommend the following: Assist for transportation;Help with stairs or ramp for entrance;Assistance with cooking/housework;Two people to help with bathing/dressing/bathroom;Two people to help with walking and/or transfers   Can travel by private vehicle        Equipment Recommendations  None recommended by PT    Recommendations for Other Services       Precautions / Restrictions Precautions Precautions: Fall;Posterior Hip Recall of Precautions/Restrictions: Impaired Precaution/Restrictions Comments: requires cues during mobility. recalls 1/3 hip precautions Restrictions Weight Bearing Restrictions Per Provider Order: Yes RLE Weight Bearing Per Provider Order: Partial weight bearing RLE Partial Weight Bearing Percentage or Pounds: 50%     Mobility  Bed Mobility Overal bed mobility: Needs Assistance Bed Mobility: Supine to Sit     Supine to sit: Mod assist, HOB elevated, Used rails, +2 for safety/equipment     General bed mobility comments: Increased time and effort. Cues  provided as needed. Assist for bil LEs. Heavy reliance on bedrails. (Pt reports she plans to sleep in her lift recliner)    Transfers Overall transfer level: Needs assistance Equipment used: Rolling walker (2 wheels) Transfers: Sit to/from Stand Sit to Stand: Mod assist, +2 physical assistance, +2 safety/equipment Stand pivot transfers: Min assist, +2 physical assistance, +2 safety/equipment         General transfer comment: x2. from lower recliner, bsc. Increased time. Cues for safety, technique, hand/LE placement. Assist to power up, stabilize, control descent.    Ambulation/Gait Ambulation/Gait assistance: Min assist, +2 physical assistance, +2 safety/equipment Gait Distance (Feet): 7 Feet Assistive device: Rolling walker (2 wheels) Gait Pattern/deviations: Step-to pattern, Wide base of support, Trunk flexed, Antalgic, Decreased stance time - right       General Gait Details: Cues for safety, sequencing, posture, adherence to WB status, pacing, breathing, proper use of RW, and for pt to take steps instead of shuffling. Pt fatigues very quickly-c/o dizziness. BP 189/54 after first walk; 139/54 after 2nd walk. Close follow with recliner. Seated rest break required. Fall risk. HR and O2 ok. Dyspnea 3/4.   Stairs             Wheelchair Mobility     Tilt Bed    Modified Rankin (Stroke Patients Only)       Balance Overall balance assessment: Needs assistance         Standing balance support: Reliant on assistive device for balance, During functional activity, Bilateral upper extremity supported Standing balance-Leahy Scale: Poor  Communication Communication Communication: No apparent difficulties  Cognition Arousal: Alert Behavior During Therapy: WFL for tasks assessed/performed   PT - Cognitive impairments: No apparent impairments                         Following commands: Intact      Cueing Cueing  Techniques: Verbal cues  Exercises     General Comments        Pertinent Vitals/Pain Pain Assessment Pain Assessment: Faces Faces Pain Scale: Hurts even more Pain Location: right hip Pain Descriptors / Indicators: Discomfort, Sore, Grimacing, Guarding, Operative site guarding Pain Intervention(s): Limited activity within patient's tolerance, Monitored during session, Repositioned    Home Living                          Prior Function            PT Goals (current goals can now be found in the care plan section) Progress towards PT goals: Progressing toward goals    Frequency    7X/week      PT Plan      Co-evaluation              AM-PAC PT 6 Clicks Mobility   Outcome Measure  Help needed turning from your back to your side while in a flat bed without using bedrails?: A Lot Help needed moving from lying on your back to sitting on the side of a flat bed without using bedrails?: A Lot Help needed moving to and from a bed to a chair (including a wheelchair)?: A Lot Help needed standing up from a chair using your arms (e.g., wheelchair or bedside chair)?: A Lot Help needed to walk in hospital room?: A Lot Help needed climbing 3-5 steps with a railing? : Total 6 Click Score: 11    End of Session Equipment Utilized During Treatment: Gait belt Activity Tolerance: Patient limited by fatigue Patient left: in chair;with call bell/phone within reach   PT Visit Diagnosis: Other abnormalities of gait and mobility (R26.89);History of falling (Z91.81)     Time: 8672-8642 PT Time Calculation (min) (ACUTE ONLY): 30 min  Charges:    $Gait Training: 8-22 mins $Therapeutic Exercise: 8-22 mins $Therapeutic Activity: 8-22 mins PT General Charges $$ ACUTE PT VISIT: 1 Visit                        Dannial SQUIBB, PT Acute Rehabilitation  Office: [phone]

## 2024-02-12 NOTE — Plan of Care (Signed)
 ?  Problem: Clinical Measurements: ?Goal: Will remain free from infection ?Outcome: Progressing ?  ?

## 2024-02-12 NOTE — Progress Notes (Signed)
 PHARMACY - ANTICOAGULATION CONSULT NOTE  Pharmacy Consult for warfarin Indication: mech MVR, valvular Afib  Allergies  Allergen Reactions   Nystatin  Other (See Comments)    unknown   Other Other (See Comments)    Product Containing 3-Hydroxy-3-Methylglutaryl-Coenzyme A Reductase Inhibitor (Product) unknown   Statins Other (See Comments)    Muscles aches,hurt    Patient Measurements: Height: 5' 8 (172.7 cm) Weight: 105.7 kg (233 lb 0.4 oz) IBW/kg (Calculated) : 63.9 HEPARIN  DW (KG): 87.6  Vital Signs: Temp: 97.8 F (36.6 C) (10/19 0458) Temp Source: Oral (10/19 0458) BP: 155/70 (10/19 0458) Pulse Rate: 63 (10/19 0458)  Labs: Recent Labs    02/10/24 0316 02/10/24 1859 02/11/24 0345 02/12/24 0320  HGB 7.4* 8.8*  --   --   HCT 25.0* 28.3*  --   --   PLT 310  --   --   --   LABPROT 23.6*  --  24.7* 28.6*  INR 2.0*  --  2.1* 2.5*    Estimated Creatinine Clearance: 41.8 mL/min (A) (by C-G formula based on SCr of 1.48 mg/dL (H)).   Medical History: Past Medical History:  Diagnosis Date   Aortic stenosis    21 mm Inspiris Edwards AVR June 2024 - [facility]   Arthritis    Cervical incompetence    Dyspnea    GERD (gastroesophageal reflux disease)    Hay fever    Heart block    [employer] DCPPM on 10/26/2022 with Dr. Saturnino - [facility]   Hemochromatosis    HOCM (hypertrophic obstructive cardiomyopathy) (HCC)    Septal myomectomy June 2024 - [facility]   Hypertension    Migraines    Mitral stenosis    25 mm [employer] mechanical MVR June 2024 - [facility]   Paroxysmal atrial fibrillation (HCC)    Presence of permanent cardiac pacemaker    Sciatica    Type 2 diabetes mellitus (HCC)     Medications:  Medications Prior to Admission  Medication Sig Dispense Refill Last Dose/Taking   acetaminophen  (TYLENOL ) 325 MG tablet Take 2 tablets (650 mg total) by mouth every 6 (six) hours as needed for mild pain (pain score 1-3) (or Fever >/= 101).   Past Week   amiodarone  (PACERONE ) 200  MG tablet TAKE 1 TABLET BY MOUTH TWICE DAILY X3 WEEKS THEN 1 TABLET EVERY DAY THERAFTER. (Patient taking differently: Take 200 mg by mouth in the morning.) 90 tablet 1 02/07/2024   enoxaparin (LOVENOX) 100 MG/ML injection Inject 1 mL (100 mg total) into the skin every 12 (twelve) hours. 10 mL 1 02/06/2024 at  9:30 AM   ezetimibe  (ZETIA ) 10 MG tablet Take 10 mg by mouth every morning.   02/07/2024   losartan  (COZAAR ) 50 MG tablet Take 1 tablet (50 mg total) by mouth daily. 90 tablet 3 02/06/2024   magnesium  oxide (MAG-OX) 400 (240 Mg) MG tablet Take 400 mg by mouth in the morning.   Past Week   MELATONIN GUMMIES PO Take 10 mg by mouth at bedtime.   Past Week   metFORMIN  (GLUCOPHAGE ) 1000 MG tablet Take 1,000 mg by mouth 2 (two) times daily.   02/06/2024   metoprolol  succinate (TOPROL -XL) 25 MG 24 hr tablet Take 1 tablet (25 mg total) by mouth 2 (two) times daily. 180 tablet 3 02/07/2024   nitrofurantoin  (MACRODANTIN ) 50 MG capsule Take 50 mg by mouth in the morning.   02/06/2024   omeprazole (PRILOSEC) 20 MG capsule Take 20 mg by mouth daily before breakfast.  3  02/07/2024   warfarin (COUMADIN ) 2.5 MG tablet TAKE 1 TABLET BY MOUTH DAILY. EXCEPT 1/2 TABLET ON MONDAYS AND THURSDAYS OR AS DIRECTED 90 tablet 1 02/01/2024   Scheduled:   amiodarone   200 mg Oral q AM   enoxaparin (LOVENOX) injection  100 mg Subcutaneous BID   ezetimibe   10 mg Oral q morning   insulin  aspart  0-15 Units Subcutaneous TID WC   insulin  aspart  0-5 Units Subcutaneous QHS   losartan   50 mg Oral Daily   melatonin  10 mg Oral QHS   metFORMIN   1,000 mg Oral BID WC   metoprolol  succinate  25 mg Oral BID   nitrofurantoin   50 mg Oral q AM   pantoprazole   40 mg Oral Daily   polyethylene glycol  17 g Oral BID   senna  2 tablet Oral QHS   Warfarin - Pharmacist Dosing Inpatient   Does not apply q1600   PRN: alum & mag hydroxide-simeth, bisacodyl , diphenhydrAMINE , HYDROmorphone  (DILAUDID ) injection, menthol  **OR** phenol,  methocarbamol  **OR** methocarbamol  (ROBAXIN ) injection, metoCLOPramide  **OR** metoCLOPramide  (REGLAN ) injection, ondansetron  **OR** ondansetron  (ZOFRAN ) IV, oxyCODONE , oxyCODONE    Assessment: 17 yoF with PMH HOCM and aortic stenosis s/p bioprosthetic AVR, mechanical MVR on warfarin with INR goal 2.5-3.5; Afib, heart block s/p PPM, HTN, HLD, DM2, admitted 10/14 for total hip arthroplasty. Bridged preoperatively per her Coumadin  clinic with therapeutic Lovenox following schedule below. Pharmacy to coordinate anticoagulation postoperatively through to discharge.   Bridging instructions per Coumadin  Clinic: 10/8  Last dose of warfarin 10/9  No Lovenox or warfarin 10/10 - 10/12  Lovenox 100mg  sq at 7am & 7pm 10/13  Lovenox 100mg  sq at 7am ----------No Lovenox in PM 10/14  No Lovenox in AM --------surgery-------admit to hospital.  Follow hospital instructions at discharge.  Noted procedure was completed late in the day (~18:00), and per Ortho PA, EBL ~1100 ml (significant). Will plan to start warfarin 10/14 PM and resume therapeutic Lovenox 10/15 AM  Baseline INR subtherapeutic as expected after holding warfarin for nearly a week Prior anticoagulation: warfarin 1.25 mg on Mon & Thurs; 2.5 mg all other days Chronically on amiodarone  200 mg daily (continued perioperatively)   Today, 02/12/2024: CBC: 8/17 hgb up from 7.4 to 8.8 after transfusion, PLT WNL INR 2.5 - therapeutic yesterday's warfarin dose = 6 mg Major drug interactions: amiodarone  (chronic) No bleeding issues documented 10/14 operative EBL ~1100 ml per PA 10/17 no bleeding on the dressing per nurse Regular diet ordered; ate 90%-100% of meals  Goal of Therapy: INR 2.5 - 3.5  Plan: Repeat Warfarin 6 mg PO x 1 today Discontinue Lovenox Daily INR CBC at least q72 hr while on warfarin; CBC tomorrow morning Monitor for signs of bleeding or thrombosis   Thank you for allowing pharmacy to be a part of this patient's  care.  Eleanor EMERSON Agent, PharmD, BCPS Clinical Pharmacist [place] 02/12/2024 10:10 AM

## 2024-02-12 NOTE — Progress Notes (Signed)
   Subjective: 5 Days Post-Op Procedure(s) (LRB): CONVERSION, PREVIOUS HIP SURGERY, TO TOTAL HIP ARTHROPLASTY (Right) REMOVAL, HARDWARE (Right) Patient reports pain as mild.   Patient seen in rounds with Dr. Ernie. Patient is resting in the recliner this morning. She appears comfortable and tells me she is doing well. She feels she is making progress each day. Per notes, she has not ambulated much at all outside of PT sessions, and only ambulating a few feet with PT.    Objective: Vital signs in last 24 hours: Temp:  [97.3 F (36.3 C)-97.9 F (36.6 C)] 97.8 F (36.6 C) (10/19 0458) Pulse Rate:  [60-64] 63 (10/19 0458) Resp:  [16-18] 16 (10/19 0458) BP: (140-160)/(68-70) 155/70 (10/19 0458) SpO2:  [96 %-97 %] 96 % (10/19 0458)  Intake/Output from previous day:  Intake/Output Summary (Last 24 hours) at 02/12/2024 0746 Last data filed at 02/12/2024 0520 Gross per 24 hour  Intake 840 ml  Output 700 ml  Net 140 ml     Intake/Output this shift: No intake/output data recorded.  Labs: Recent Labs    02/10/24 0316 02/10/24 1859  HGB 7.4* 8.8*   Recent Labs    02/10/24 0316 02/10/24 1859  WBC 17.0*  --   RBC 2.62*  --   HCT 25.0* 28.3*  PLT 310  --    No results for input(s): NA, K, CL, CO2, BUN, CREATININE, GLUCOSE, CALCIUM in the last 72 hours. Recent Labs    02/11/24 0345 02/12/24 0320  INR 2.1* 2.5*    Exam: General - Patient is Alert and Oriented Extremity - Neurologically intact Sensation intact distally Intact pulses distally Dorsiflexion/Plantar flexion intact Dressing - dressing C/D/I Motor Function - intact, moving foot and toes well on exam.   Past Medical History:  Diagnosis Date   Aortic stenosis    21 mm Inspiris Edwards AVR June 2024 - [facility]   Arthritis    Cervical incompetence    Dyspnea    GERD (gastroesophageal reflux disease)    Hay fever    Heart block    [employer] DCPPM on 10/26/2022 with Dr. Saturnino - [facility]    Hemochromatosis    HOCM (hypertrophic obstructive cardiomyopathy) (HCC)    Septal myomectomy June 2024 - [facility]   Hypertension    Migraines    Mitral stenosis    25 mm [employer] mechanical MVR June 2024 - [facility]   Paroxysmal atrial fibrillation (HCC)    Presence of permanent cardiac pacemaker    Sciatica    Type 2 diabetes mellitus (HCC)     Assessment/Plan: 5 Days Post-Op Procedure(s) (LRB): CONVERSION, PREVIOUS HIP SURGERY, TO TOTAL HIP ARTHROPLASTY (Right) REMOVAL, HARDWARE (Right) Principal Problem:   S/P total right hip arthroplasty  Estimated body mass index is 35.43 kg/m as calculated from the following:   Height as of this encounter: 5' 8 (1.727 m).   Weight as of this encounter: 105.7 kg. Advance diet Up with therapy  DVT Prophylaxis - Coumadin  PWB 50%  ABLA: s/p 1 unit PRBC on Friday, improved to 8.8  Will d/c purewick today to encourage her to get up to bathroom as she will have to do at home  Hopeful for d/c home tomorrow but would need to make progress before she can do this  Will order HHPT  Rosina Calin, PA-C Orthopedic Surgery [phone] 02/12/2024, 7:46 AM

## 2024-02-12 NOTE — Progress Notes (Signed)
 Physical Therapy Treatment Patient Details Name: [name] MRN: [id number] DOB: April 19, 1951 Today's Date: 02/12/2024   History of Present Illness 73 yo female s/p CONVERSION, PREVIOUS R HIP SURGERY, to THA on 02/08/24. PMH: heart block with pacemaker, afib, aortic stenosis, cardiomyopathy, HTN, DM2, R TKA, R RCR, obesity    PT Comments  Pt and NT report pt just back to bed with +2 assist after using BSC-both report fatigue and dyspnea. Pt agreeable to bed level exercises this am. Pt tolerated exercises fairly well. Moderate pain reported. O2 93% on RA. Will resume gait training next session.     If plan is discharge home, recommend the following: Assist for transportation;Help with stairs or ramp for entrance;Assistance with cooking/housework;Two people to help with bathing/dressing/bathroom;Two people to help with walking and/or transfers   Can travel by private vehicle        Equipment Recommendations  None recommended by PT    Recommendations for Other Services       Precautions / Restrictions Precautions Precautions: Fall;Posterior Hip Recall of Precautions/Restrictions: Impaired Precaution/Restrictions Comments: requires cues during mobility. recalls 1/3 hip precautions Restrictions Weight Bearing Restrictions Per Provider Order: Yes RLE Weight Bearing Per Provider Order: Partial weight bearing RLE Partial Weight Bearing Percentage or Pounds: 50%     Mobility  Bed Mobility               General bed mobility comments: Deferred-pt just back to bed with nursing-reports dypsnea and fatigue. Agreeable to bed level exercises this session    Transfers                        Ambulation/Gait                   Stairs             Wheelchair Mobility     Tilt Bed    Modified Rankin (Stroke Patients Only)       Balance Overall balance assessment: Needs assistance         Standing balance support: Reliant on assistive device for  balance, During functional activity, Bilateral upper extremity supported Standing balance-Leahy Scale: Poor                              Communication Communication Communication: No apparent difficulties  Cognition Arousal: Alert Behavior During Therapy: WFL for tasks assessed/performed   PT - Cognitive impairments: No apparent impairments                         Following commands: Intact      Cueing Cueing Techniques: Verbal cues  Exercises Total Joint Exercises Ankle Circles/Pumps: AROM, Both, 15 reps Quad Sets: AROM, Both, 15 reps Short Arc Quad: AROM, Right, 15 reps Heel Slides: AAROM, Right, 15 reps Hip ABduction/ADduction: AAROM, Right, 15 reps Straight Leg Raises: AAROM, Right, 5 reps    General Comments        Pertinent Vitals/Pain Pain Assessment Pain Assessment: Faces Faces Pain Scale: Hurts even more Pain Location: right hip Pain Descriptors / Indicators: Discomfort, Sore, Grimacing, Guarding, Operative site guarding Pain Intervention(s): Limited activity within patient's tolerance, Monitored during session, Repositioned    Home Living                          Prior Function  PT Goals (current goals can now be found in the care plan section) Progress towards PT goals: Progressing toward goals    Frequency    7X/week      PT Plan      Co-evaluation              AM-PAC PT 6 Clicks Mobility   Outcome Measure  Help needed turning from your back to your side while in a flat bed without using bedrails?: A Lot Help needed moving from lying on your back to sitting on the side of a flat bed without using bedrails?: A Lot Help needed moving to and from a bed to a chair (including a wheelchair)?: A Lot Help needed standing up from a chair using your arms (e.g., wheelchair or bedside chair)?: A Lot Help needed to walk in hospital room?: A Lot Help needed climbing 3-5 steps with a railing? : Total 6  Click Score: 11    End of Session Equipment Utilized During Treatment: Gait belt Activity Tolerance: Patient tolerated treatment well Patient left: in bed;with call bell/phone within reach;with bed alarm set   PT Visit Diagnosis: Other abnormalities of gait and mobility (R26.89);History of falling (Z91.81)     Time: 1050-1100 PT Time Calculation (min) (ACUTE ONLY): 10 min  Charges:    $Therapeutic Exercise: 8-22 mins PT General Charges $$ ACUTE PT VISIT: 1 Visit                         Dannial SQUIBB, PT Acute Rehabilitation  Office: [phone]

## 2024-02-13 ENCOUNTER — Ambulatory Visit: Attending: Cardiovascular Disease

## 2024-02-13 LAB — GLUCOSE, CAPILLARY
Glucose-Capillary: 128 mg/dL — ABNORMAL HIGH (ref 70–99)
Glucose-Capillary: 164 mg/dL — ABNORMAL HIGH (ref 70–99)

## 2024-02-13 LAB — CBC
HCT: 26.2 % — ABNORMAL LOW (ref 36.0–46.0)
Hemoglobin: 7.8 g/dL — ABNORMAL LOW (ref 12.0–15.0)
MCH: 27.7 pg (ref 26.0–34.0)
MCHC: 29.8 g/dL — ABNORMAL LOW (ref 30.0–36.0)
MCV: 92.9 fL (ref 80.0–100.0)
Platelets: 319 K/uL (ref 150–400)
RBC: 2.82 MIL/uL — ABNORMAL LOW (ref 3.87–5.11)
RDW: 13.9 % (ref 11.5–15.5)
WBC: 10 K/uL (ref 4.0–10.5)
nRBC: 0.3 % — ABNORMAL HIGH (ref 0.0–0.2)

## 2024-02-13 LAB — PROTIME-INR
INR: 3 — ABNORMAL HIGH (ref 0.8–1.2)
Prothrombin Time: 32.3 s — ABNORMAL HIGH (ref 11.4–15.2)

## 2024-02-13 MED ORDER — WARFARIN SODIUM 6 MG PO TABS
6.0000 mg | ORAL_TABLET | Freq: Once | ORAL | Status: DC
  Filled 2024-02-13: qty 1

## 2024-02-13 NOTE — Plan of Care (Signed)
  Problem: Clinical Measurements: Goal: Will remain free from infection Outcome: Progressing Goal: Respiratory complications will improve Outcome: Progressing Goal: Cardiovascular complication will be avoided Outcome: Progressing   Problem: Coping: Goal: Level of anxiety will decrease Outcome: Progressing   Problem: Elimination: Goal: Will not experience complications related to urinary retention Outcome: Progressing   Problem: Pain Managment: Goal: General experience of comfort will improve and/or be controlled Outcome: Progressing   Problem: Safety: Goal: Ability to remain free from injury will improve Outcome: Progressing

## 2024-02-13 NOTE — Progress Notes (Signed)
 Patient ID: Niels DELENA Many, female   DOB: 1951-03-31, 73 y.o.   MRN: [id number] Subjective: 6 Days Post-Op Procedure(s) (LRB): CONVERSION, PREVIOUS HIP SURGERY, TO TOTAL HIP ARTHROPLASTY (Right) REMOVAL, HARDWARE (Right)    Patient reports pain as moderate. Continues with slow progress with 2+ assistance to get up  No events Feeling better however  Objective:   VITALS:   Vitals:   02/12/24 1324 02/12/24 2110  BP: (!) 152/68 131/82  Pulse: 65 64  Resp: 17 16  Temp: 98.2 F (36.8 C) 98.2 F (36.8 C)  SpO2: 97% 98%    Neurovascular intact Incision: dressing C/D/I  LABS Recent Labs    02/10/24 1859 02/13/24 0310  HGB 8.8* 7.8*  HCT 28.3* 26.2*  WBC  --  10.0  PLT  --  319    No results for input(s): NA, K, BUN, CREATININE, GLUCOSE in the last 72 hours.  Recent Labs    02/12/24 0320 02/13/24 0310  INR 2.5* 3.0*     Assessment/Plan: 6 Days Post-Op Procedure(s) (LRB): CONVERSION, PREVIOUS HIP SURGERY, TO TOTAL HIP ARTHROPLASTY (Right) REMOVAL, HARDWARE (Right)   Up with therapy, continue efforts while here Due to lack of significant safe progress we will engage TOC to assist in ST SNF placement at this point despite her hopes

## 2024-02-13 NOTE — Progress Notes (Signed)
 Physical Therapy Treatment Patient Details Name: [name] MRN: [id number] DOB: 04/13/1951 Today's Date: 02/13/2024   History of Present Illness 73 yo female s/p CONVERSION, PREVIOUS R HIP SURGERY, to THA on 02/08/24. PMH: heart block with pacemaker, afib, aortic stenosis, cardiomyopathy, HTN, DM2, R TKA, R RCR, obesity    PT Comments  Pt is making good progress this session with sit<>stand and stand pivot bed <>chair  transfers; discussed plan with pt, she is adamant that SNF is not an option.  She is meeting goals for +1 assist for transfers, has lift chair (sleeps in lift chair), w/c, walker etc. Pt state she spoke to her husband and he is comfortable with d/c home. Pt will need non-emergent transport home as she has 6 steps and is unable to ascend stairs and maintain PWB.    If plan is discharge home, recommend the following: Assist for transportation;Help with stairs or ramp for entrance;Assistance with cooking/housework;Two people to help with bathing/dressing/bathroom;Two people to help with walking and/or transfers   Can travel by private vehicle        Equipment Recommendations  None recommended by PT    Recommendations for Other Services       Precautions / Restrictions Precautions Precautions: Fall;Posterior Hip Recall of Precautions/Restrictions: Impaired Precaution/Restrictions Comments: requires cues during mobility. recalls 2/3 hip precautions Restrictions RLE Weight Bearing Per Provider Order: Partial weight bearing RLE Partial Weight Bearing Percentage or Pounds: 50%     Mobility  Bed Mobility               General bed mobility comments: pt in recliner    Transfers Overall transfer level: Needs assistance Equipment used: Rolling walker (2 wheels) Transfers: Sit to/from Stand, Bed to chair/wheelchair/BSC Sit to Stand: Min assist, Mod assist, +2 safety/equipment   Step pivot transfers: Min assist, +2 safety/equipment, From elevated surface        General transfer comment: STS x2, SPT x2 recliner to bed; from lower recliner pt requires min to light mod assist, from elevated bed (simulated lift chair) min assist and Increased time. pt uses momentum to coem to stand Cues for safety, technique, hand/LE placement. Assist to power up, stabilize, control descent.    Ambulation/Gait               General Gait Details: a few steps fwd adn backt o surface ~ 4-5'; for SPT x2   Stairs             Wheelchair Mobility     Tilt Bed    Modified Rankin (Stroke Patients Only)       Balance           Standing balance support: During functional activity, Reliant on assistive device for balance, Single extremity supported   Standing balance comment: reliant on at least unilateral UE support                            Communication Communication Communication: No apparent difficulties  Cognition Arousal: Alert Behavior During Therapy: WFL for tasks assessed/performed   PT - Cognitive impairments: No apparent impairments                         Following commands: Intact      Cueing Cueing Techniques: Verbal cues  Exercises      General Comments        Pertinent Vitals/Pain Pain Assessment Pain Assessment: Faces Faces  Pain Scale: Hurts little more Pain Location: right hip Pain Descriptors / Indicators: Discomfort, Sore, Grimacing, Guarding, Operative site guarding Pain Intervention(s): Limited activity within patient's tolerance, Monitored during session, Premedicated before session, Repositioned, Ice applied    Home Living                          Prior Function            PT Goals (current goals can now be found in the care plan section) Acute Rehab PT Goals Patient Stated Goal: home PT Goal Formulation: With patient Time For Goal Achievement: 02/22/24 Potential to Achieve Goals: Fair Progress towards PT goals: Progressing toward goals    Frequency     7X/week      PT Plan      Co-evaluation              AM-PAC PT 6 Clicks Mobility   Outcome Measure  Help needed turning from your back to your side while in a flat bed without using bedrails?: A Lot Help needed moving from lying on your back to sitting on the side of a flat bed without using bedrails?: A Lot Help needed moving to and from a bed to a chair (including a wheelchair)?: A Lot Help needed standing up from a chair using your arms (e.g., wheelchair or bedside chair)?: A Lot Help needed to walk in hospital room?: A Lot   6 Click Score: 10    End of Session Equipment Utilized During Treatment: Gait belt Activity Tolerance: Patient tolerated treatment well;Patient limited by fatigue Patient left: in chair;with call bell/phone within reach;with chair alarm set Nurse Communication: Mobility status PT Visit Diagnosis: Other abnormalities of gait and mobility (R26.89);History of falling (Z91.81)     Time: 1045-1106 PT Time Calculation (min) (ACUTE ONLY): 21 min  Charges:    $Therapeutic Activity: 8-22 mins PT General Charges $$ ACUTE PT VISIT: 1 Visit                     Ondrea Dow, PT  Acute Rehab Dept [facility]) [phone]  02/13/2024    [facility] 02/13/2024, 11:18 AM

## 2024-02-13 NOTE — TOC Transition Note (Signed)
 Transition of Care [facility]) - Discharge Note   Patient Details  Name: [name] MRN: [id number] Date of Birth: [birth year]  Transition of Care [facility]) CM/SW Contact:  Heather DELENA Saltness, LCSW Phone Number: 02/13/2024, 12:42 PM   Clinical Narrative:    CSW met with pt at bedside to discuss discharging planning for today. CSW advised pt of PT's recommendation for [facility] PT upon discharge. Pt agreeable with recommendation, preferred [facility] agency is [facility]. CSW spoke with Amy at [facility] who confirmed ability to accept pt for services. PT recommended pt be transported home via [facility] due to multiple steps into the home. PTAR called at 12:32 PM. D/C packet placed in pt's chart at RN station. CSW spoke with pt's husband, Neka Bise, via phone call at [phone], to confirm discharge plan. Pt and husband in agreement with discharge plan. No further TOC needs at this time.   Final next level of care: Home w Home Health Services Barriers to Discharge: Barriers Resolved   Patient Goals and CMS Choice Patient states their goals for this hospitalization and ongoing recovery are:: To return home CMS Medicare.gov Compare Post Acute Care list provided to:: Patient Choice offered to / list presented to : Patient [place] ownership interest in [facility].provided to:: Patient    Discharge Placement  Home              Patient to be transferred to facility by: PTAR Name of family member notified: Ronnie Shiraishi Patient and family notified of of transfer: 02/13/24  Discharge Plan and Services Additional resources added to the After Visit Summary for  [facility]                DME Arranged: N/A DME Agency: NA       HH Arranged: PT HH Agency: [facility] Date [facility] Agency Contacted: 02/13/24 Time HH Agency Contacted: 1241 Representative spoke with at [facility] Agency: Amy  Social Drivers of Health (SDOH) Interventions SDOH Screenings   Food Insecurity: No Food Insecurity  (02/07/2024)  Housing: Low Risk  (02/07/2024)  Transportation Needs: No Transportation Needs (02/07/2024)  Utilities: Not At Risk (02/07/2024)  Depression (PHQ2-9): Low Risk  (04/18/2023)  Social Connections: Moderately Integrated (02/07/2024)  Tobacco Use: Medium Risk (02/07/2024)     Readmission Risk Interventions    02/08/2024   10:04 AM  Readmission Risk Prevention Plan  Transportation Screening Complete  PCP or Specialist Appt within 5-7 Days Complete  Home Care Screening Complete  Medication Review (RN CM) Complete    Signed: Heather Saltness, MSW, LCSW Clinical Social Worker Inpatient Care Management 02/13/2024 12:45 PM

## 2024-02-13 NOTE — Progress Notes (Signed)
 Patient was picked up by PTAR. Packet including discharge instructions were in the PTAR packet. Patient was stable for discharge, and all questions were answered.

## 2024-02-13 NOTE — Progress Notes (Addendum)
 PHARMACY - ANTICOAGULATION CONSULT NOTE  Pharmacy Consult for warfarin Indication: mech MVR, valvular Afib  Allergies  Allergen Reactions   Nystatin  Other (See Comments)    unknown   Other Other (See Comments)    Product Containing 3-Hydroxy-3-Methylglutaryl-Coenzyme A Reductase Inhibitor (Product) unknown   Statins Other (See Comments)    Muscles aches,hurt    Patient Measurements: Height: 5' 8 (172.7 cm) Weight: 105.7 kg (233 lb 0.4 oz) IBW/kg (Calculated) : 63.9 HEPARIN  DW (KG): 87.6  Vital Signs: Temp: 97.5 F (36.4 C) (10/20 0501) Temp Source: Oral (10/20 0501) BP: 168/74 (10/20 0501) Pulse Rate: 61 (10/20 0501)  Labs: Recent Labs    02/10/24 1859 02/11/24 0345 02/12/24 0320 02/13/24 0310  HGB 8.8*  --   --  7.8*  HCT 28.3*  --   --  26.2*  PLT  --   --   --  319  LABPROT  --  24.7* 28.6* 32.3*  INR  --  2.1* 2.5* 3.0*    Estimated Creatinine Clearance: 41.8 mL/min (A) (by C-G formula based on SCr of 1.48 mg/dL (H)).   Medical History: Past Medical History:  Diagnosis Date   Aortic stenosis    21 mm Inspiris Edwards AVR June 2024 - [facility]   Arthritis    Cervical incompetence    Dyspnea    GERD (gastroesophageal reflux disease)    Hay fever    Heart block    [employer] DCPPM on 10/26/2022 with Dr. Saturnino - [facility]   Hemochromatosis    HOCM (hypertrophic obstructive cardiomyopathy) (HCC)    Septal myomectomy June 2024 - [facility]   Hypertension    Migraines    Mitral stenosis    25 mm [employer]ent mechanical MVR June 2024 - [facility]   Paroxysmal atrial fibrillation (HCC)    Presence of permanent cardiac pacemaker    Sciatica    Type 2 diabetes mellitus (HCC)     Medications:  Medications Prior to Admission  Medication Sig Dispense Refill Last Dose/Taking   acetaminophen  (TYLENOL ) 325 MG tablet Take 2 tablets (650 mg total) by mouth every 6 (six) hours as needed for mild pain (pain score 1-3) (or Fever >/= 101).   Past Week   amiodarone  (PACERONE ) 200  MG tablet TAKE 1 TABLET BY MOUTH TWICE DAILY X3 WEEKS THEN 1 TABLET EVERY DAY THERAFTER. (Patient taking differently: Take 200 mg by mouth in the morning.) 90 tablet 1 02/07/2024   enoxaparin (LOVENOX) 100 MG/ML injection Inject 1 mL (100 mg total) into the skin every 12 (twelve) hours. 10 mL 1 02/06/2024 at  9:30 AM   ezetimibe  (ZETIA ) 10 MG tablet Take 10 mg by mouth every morning.   02/07/2024   losartan  (COZAAR ) 50 MG tablet Take 1 tablet (50 mg total) by mouth daily. 90 tablet 3 02/06/2024   magnesium  oxide (MAG-OX) 400 (240 Mg) MG tablet Take 400 mg by mouth in the morning.   Past Week   MELATONIN GUMMIES PO Take 10 mg by mouth at bedtime.   Past Week   metFORMIN  (GLUCOPHAGE ) 1000 MG tablet Take 1,000 mg by mouth 2 (two) times daily.   02/06/2024   metoprolol  succinate (TOPROL -XL) 25 MG 24 hr tablet Take 1 tablet (25 mg total) by mouth 2 (two) times daily. 180 tablet 3 02/07/2024   nitrofurantoin  (MACRODANTIN ) 50 MG capsule Take 50 mg by mouth in the morning.   02/06/2024   omeprazole (PRILOSEC) 20 MG capsule Take 20 mg by mouth daily before breakfast.  3  02/07/2024   warfarin (COUMADIN ) 2.5 MG tablet TAKE 1 TABLET BY MOUTH DAILY. EXCEPT 1/2 TABLET ON MONDAYS AND THURSDAYS OR AS DIRECTED 90 tablet 1 02/01/2024   Scheduled:   amiodarone   200 mg Oral q AM   ezetimibe   10 mg Oral q morning   insulin  aspart  0-15 Units Subcutaneous TID WC   insulin  aspart  0-5 Units Subcutaneous QHS   losartan   50 mg Oral Daily   melatonin  10 mg Oral QHS   metFORMIN   1,000 mg Oral BID WC   metoprolol  succinate  25 mg Oral BID   nitrofurantoin   50 mg Oral q AM   pantoprazole   40 mg Oral Daily   polyethylene glycol  17 g Oral BID   senna  2 tablet Oral QHS   Warfarin - Pharmacist Dosing Inpatient   Does not apply q1600   PRN: alum & mag hydroxide-simeth, bisacodyl , diphenhydrAMINE , HYDROmorphone  (DILAUDID ) injection, menthol  **OR** phenol, methocarbamol  **OR** methocarbamol  (ROBAXIN ) injection,  metoCLOPramide  **OR** metoCLOPramide  (REGLAN ) injection, ondansetron  **OR** ondansetron  (ZOFRAN ) IV, oxyCODONE , oxyCODONE    Assessment: 30 yoF with PMH HOCM and aortic stenosis s/p bioprosthetic AVR, mechanical MVR on warfarin with INR goal 2.5-3.5; Afib, heart block s/p PPM, HTN, HLD, DM2, admitted 10/14 for total hip arthroplasty. Bridged preoperatively per her Coumadin  clinic with therapeutic Lovenox following schedule below. Pharmacy to coordinate anticoagulation postoperatively through to discharge.   Bridging instructions per Coumadin  Clinic: 10/8  Last dose of warfarin 10/9  No Lovenox or warfarin 10/10 - 10/12  Lovenox 100mg  sq at 7am & 7pm 10/13  Lovenox 100mg  sq at 7am ----------No Lovenox in PM 10/14  No Lovenox in AM --------surgery-------admit to hospital.  Follow hospital instructions at discharge.  Noted procedure was completed late in the day (~18:00), and per Ortho PA, EBL ~1100 ml (significant). Will plan to start warfarin 10/14 PM and resume therapeutic Lovenox 10/15 AM  Baseline INR subtherapeutic as expected after holding warfarin for nearly a week Prior anticoagulation: warfarin 1.25 mg on Mon & Thurs; 2.5 mg all other days Chronically on amiodarone  200 mg daily (continued perioperatively)   Today, 02/13/2024: CBC: hgb down to 7.8, PLT WNL INR 3.0 - therapeutic Yesterday's warfarin dose = 6 mg Major drug interactions: amiodarone  (chronic) No bleeding issues documented 10/14 operative EBL ~1100 ml per PA 10/17 no bleeding on the dressing per nurse Regular diet ordered; meal intake documented as 90%-100%  Goal of Therapy: INR 2.5 - 3.5  Plan: Warfarin 6 mg PO x 1 today Daily INR CBC at least q72 hr while on warfarin Monitor for signs of bleeding or thrombosis  Thank you for allowing pharmacy to be a part of this patient's care.  Marget Hench, PharmD Clinical Pharmacist 10/20/20257:26 AM

## 2024-02-14 LAB — CUP PACEART REMOTE DEVICE CHECK
Battery Remaining Longevity: 156 mo
Battery Remaining Percentage: 100 %
Brady Statistic RA Percent Paced: 94 %
Brady Statistic RV Percent Paced: 100 %
Date Time Interrogation Session: 20251020145000
Implantable Lead Connection Status: 753985
Implantable Lead Connection Status: 753985
Implantable Lead Implant Date: 20240702
Implantable Lead Implant Date: 20240702
Implantable Lead Location: 753859
Implantable Lead Location: 753860
Implantable Lead Model: 7841
Implantable Lead Model: 7842
Implantable Lead Serial Number: 1337013
Implantable Lead Serial Number: 1458949
Implantable Pulse Generator Implant Date: 20240702
Lead Channel Impedance Value: 524 Ohm
Lead Channel Impedance Value: 566 Ohm
Lead Channel Setting Pacing Amplitude: 2 V
Lead Channel Setting Pacing Amplitude: 2.5 V
Lead Channel Setting Pacing Pulse Width: 0.4 ms
Lead Channel Setting Sensing Sensitivity: 2.5 mV
Pulse Gen Serial Number: 680078
Zone Setting Status: 755011

## 2024-02-15 ENCOUNTER — Ambulatory Visit: Payer: Self-pay | Admitting: Internal Medicine

## 2024-02-16 NOTE — Progress Notes (Signed)
 Remote PPM Transmission

## 2024-02-16 NOTE — Discharge Summary (Signed)
 Patient ID: [name] MRN: [id number] DOB/AGE: February 24, 1951 73 y.o.  Admit date: 02/07/2024 Discharge date: 02/13/2024  Admission Diagnoses:  Right hip osteoarthritis  Discharge Diagnoses:  Principal Problem:   S/P total right hip arthroplasty Active Problems:   Acute blood loss anemia   Past Medical History:  Diagnosis Date   Aortic stenosis    21 mm Inspiris Edwards AVR June 2024 - [facility]   Arthritis    Cervical incompetence    Dyspnea    GERD (gastroesophageal reflux disease)    Hay fever    Heart block    [employer] DCPPM on 10/26/2022 with Dr. Saturnino - [facility]   Hemochromatosis    HOCM (hypertrophic obstructive cardiomyopathy) (HCC)    Septal myomectomy June 2024 - [facility]   Hypertension    Migraines    Mitral stenosis    25 mm [employer] mechanical MVR June 2024 - [facility]   Paroxysmal atrial fibrillation (HCC)    Presence of permanent cardiac pacemaker    Sciatica    Type 2 diabetes mellitus (HCC)     Surgeries: Procedure(s): CONVERSION, PREVIOUS HIP SURGERY, TO TOTAL HIP ARTHROPLASTY REMOVAL, HARDWARE on 02/07/2024   Consultants:   Discharged Condition: Improved  Hospital Course: [name] is an 73 y.o. female who was admitted 02/07/2024 for operative treatment ofS/P total right hip arthroplasty. Patient has severe unremitting pain that affects sleep, daily activities, and work/hobbies. After pre-op clearance the patient was taken to the operating room on 02/07/2024 and underwent  Procedure(s): CONVERSION, PREVIOUS HIP SURGERY, TO TOTAL HIP ARTHROPLASTY REMOVAL, HARDWARE.    Patient was given perioperative antibiotics:  Anti-infectives (From admission, onward)    Start     Dose/Rate Route Frequency Ordered Stop   02/08/24 1000  nitrofurantoin  (MACRODANTIN ) capsule 50 mg  Status:  Discontinued        50 mg Oral Every morning 02/07/24 1933 02/13/24 1839   02/08/24 0600  ceFAZolin  (ANCEF ) IVPB 2g/100 mL premix        2 g 200 mL/hr over 30 Minutes  Intravenous On call to O.R. 02/07/24 1217 02/08/24 0630   02/07/24 2100  ceFAZolin  (ANCEF ) IVPB 2g/100 mL premix        2 g 200 mL/hr over 30 Minutes Intravenous Every 6 hours 02/07/24 1839 02/08/24 0252        Patient was given sequential compression devices, early ambulation, and chemoprophylaxis to prevent DVT. Patient worked with PT and was meeting their goals regarding safe ambulation and transfers. She made very slow progress with PT. On POD #3 her hgb was 7.4 and we elected to give her 1 unit PRBC for symptomatic benefit. This improved to 8.8. She eventually progressed to a level safe for discharge home with HHPT.   Patient benefited maximally from hospital stay and there were no complications.    Recent vital signs: No data found.   Recent laboratory studies: No results for input(s): WBC, HGB, HCT, PLT, NA, K, CL, CO2, BUN, CREATININE, GLUCOSE, INR, CALCIUM in the last 72 hours.  Invalid input(s): PT, 2   Discharge Medications:   Allergies as of 02/13/2024       Reactions   Nystatin  Other (See Comments)   unknown   Other Other (See Comments)   Product Containing 3-Hydroxy-3-Methylglutaryl-Coenzyme A Reductase Inhibitor (Product) unknown   Statins Other (See Comments)   Muscles aches,hurt        Medication List     TAKE these medications    acetaminophen  325 MG tablet Commonly  known as: TYLENOL  Take 2 tablets (650 mg total) by mouth every 6 (six) hours as needed for mild pain (pain score 1-3) (or Fever >/= 101).   amiodarone  200 MG tablet Commonly known as: PACERONE  TAKE 1 TABLET BY MOUTH TWICE DAILY X3 WEEKS THEN 1 TABLET EVERY DAY THERAFTER. What changed: See the new instructions.   enoxaparin 100 MG/ML injection Commonly known as: LOVENOX Inject 1 mL (100 mg total) into the skin every 12 (twelve) hours.   ezetimibe  10 MG tablet Commonly known as: ZETIA  Take 10 mg by mouth every morning.   losartan  50 MG tablet Commonly  known as: COZAAR  Take 1 tablet (50 mg total) by mouth daily.   magnesium  oxide 400 (240 Mg) MG tablet Commonly known as: MAG-OX Take 400 mg by mouth in the morning.   MELATONIN GUMMIES PO Take 10 mg by mouth at bedtime.   metFORMIN  1000 MG tablet Commonly known as: GLUCOPHAGE  Take 1,000 mg by mouth 2 (two) times daily.   methocarbamol  500 MG tablet Commonly known as: ROBAXIN  Take 1 tablet (500 mg total) by mouth every 6 (six) hours as needed for muscle spasms.   metoprolol  succinate 25 MG 24 hr tablet Commonly known as: TOPROL -XL Take 1 tablet (25 mg total) by mouth 2 (two) times daily.   nitrofurantoin  50 MG capsule Commonly known as: MACRODANTIN  Take 50 mg by mouth in the morning.   omeprazole 20 MG capsule Commonly known as: PRILOSEC Take 20 mg by mouth daily before breakfast.   oxyCODONE  5 MG immediate release tablet Commonly known as: Oxy IR/ROXICODONE  Take 1 tablet (5 mg total) by mouth every 4 (four) hours as needed for severe pain (pain score 7-10).   polyethylene glycol 17 g packet Commonly known as: MIRALAX  / GLYCOLAX  Take 17 g by mouth 2 (two) times daily.   senna 8.6 MG Tabs tablet Commonly known as: SENOKOT Take 2 tablets (17.2 mg total) by mouth at bedtime for 14 days.   warfarin 2.5 MG tablet Commonly known as: COUMADIN  Take as directed. If you are unsure how to take this medication, talk to your nurse or doctor. Original instructions: TAKE 1 TABLET BY MOUTH DAILY. EXCEPT 1/2 TABLET ON MONDAYS AND THURSDAYS OR AS DIRECTED               Discharge Care Instructions  (From admission, onward)           Start     Ordered   02/13/24 0000  Change dressing       Comments: Maintain surgical dressing until follow up in the clinic. If the edges start to pull up, may reinforce with tape. If the dressing is no longer working, may remove and cover with gauze and tape, but must keep the area dry and clean.  Call with any questions or concerns.    02/13/24 1239            Diagnostic Studies: CUP PACEART REMOTE DEVICE CHECK Result Date: 02/14/2024 PPM Scheduled remote reviewed. Normal device function.  Presenting rhythm:  AP-VP. Next remote transmission per protocol. - CS, CVRS  DG HIP UNILAT WITH PELVIS 2-3 VIEWS RIGHT Result Date: 02/09/2024 CLINICAL DATA:  Possible hip dislocation EXAM: DG HIP (WITH OR WITHOUT PELVIS) 2-3V RIGHT COMPARISON:  CT 12/07/2023, plain film 02/07/2024 FINDINGS: Postoperative changes from right hip replacement. Bone fragments noted in the region of the lesser trochanter and proximal femoral shaft which appear chronic. No subluxation or dislocation. No acute fracture. IMPRESSION: Changes of right hip  replacement.  No visible complicating feature. Electronically Signed   By: Franky Crease M.D.   On: 02/09/2024 20:55   DG Pelvis Portable Result Date: 02/07/2024 CLINICAL DATA:  Post total hip arthroplasty. EXAM: DG PORTABLE PELVIS COMPARISON:  06/11/2023 FINDINGS: Changes of right total hip replacement. No hardware complicating feature. Mild degenerative changes in the left hip. IMPRESSION: Right hip replacement.  No visible complicating feature. Electronically Signed   By: Franky Crease M.D.   On: 02/07/2024 20:41    Disposition: Discharge disposition: 01-Home or Self Care       Discharge Instructions     Call MD / Call 911   Complete by: As directed    If you experience chest pain or shortness of breath, CALL 911 and be transported to the hospital emergency room.  If you develope a fever above 101 F, pus (white drainage) or increased drainage or redness at the wound, or calf pain, call your surgeon's office.   Change dressing   Complete by: As directed    Maintain surgical dressing until follow up in the clinic. If the edges start to pull up, may reinforce with tape. If the dressing is no longer working, may remove and cover with gauze and tape, but must keep the area dry and clean.  Call with any  questions or concerns.   Constipation Prevention   Complete by: As directed    Drink plenty of fluids.  Prune juice may be helpful.  You may use a stool softener, such as Colace (over the counter) 100 mg twice a day.  Use MiraLax  (over the counter) for constipation as needed.   Diet - low sodium heart healthy   Complete by: As directed    Increase activity slowly as tolerated   Complete by: As directed    Partial weight bearing with assist device as directed.   Post-operative opioid taper instructions:   Complete by: As directed    POST-OPERATIVE OPIOID TAPER INSTRUCTIONS: It is important to wean off of your opioid medication as soon as possible. If you do not need pain medication after your surgery it is ok to stop day one. Opioids include: Codeine, Hydrocodone(Norco, Vicodin), Oxycodone (Percocet, oxycontin ) and hydromorphone  amongst others.  Long term and even short term use of opiods can cause: Increased pain response Dependence Constipation Depression Respiratory depression And more.  Withdrawal symptoms can include Flu like symptoms Nausea, vomiting And more Techniques to manage these symptoms Hydrate well Eat regular healthy meals Stay active Use relaxation techniques(deep breathing, meditating, yoga) Do Not substitute Alcohol to help with tapering If you have been on opioids for less than two weeks and do not have pain than it is ok to stop all together.  Plan to wean off of opioids This plan should start within one week post op of your joint replacement. Maintain the same interval or time between taking each dose and first decrease the dose.  Cut the total daily intake of opioids by one tablet each day Next start to increase the time between doses. The last dose that should be eliminated is the evening dose.      TED hose   Complete by: As directed    Use stockings (TED hose) for 2 weeks on both leg(s).  You may remove them at night for sleeping.         Follow-up Information     Ernie Cough, MD. Schedule an appointment as soon as possible for a visit in 2 week(s).   Specialty:  Orthopedic Surgery Contact information: [address] [place] [address] [phone]         [facility]) Follow up.   Specialty: Home Health Services Why: This provider will reach out to you 24-48 hours after discharge to begin Home Health PT services. Contact information: [address] [place]  [phone]                 Signed: Rosina JONELLE Calin 02/16/2024, 9:53 AM

## 2024-02-22 ENCOUNTER — Telehealth: Payer: Self-pay | Admitting: Cardiovascular Disease

## 2024-02-22 NOTE — Telephone Encounter (Signed)
 Called and left messages on VM for Re[facility] and Medford that it would be great if they could check pt's INR while they are in the home.  Pt is past shoulder surgery.  Requested INR be checked 11/3 or 11/4.  Call results to me for dosing. Left my call back number to call me back if the have questions.

## 2024-02-22 NOTE — Telephone Encounter (Signed)
 Calling to see when patient needs to get her INR check and can PT check her INR. Please advise   If orders can be called in for patient, Please reach malva Barefoot [phone]

## 2024-02-28 ENCOUNTER — Telehealth: Payer: Self-pay | Admitting: Cardiovascular Disease

## 2024-02-28 NOTE — Telephone Encounter (Signed)
 Calling in about PT INR check for the patient. Please advise

## 2024-02-28 NOTE — Telephone Encounter (Signed)
 Physical Therapy was suppose to check INR today at pt visit but INR machine was not working properly.  Will check later this week of first of next week and call results to me.

## 2024-03-06 ENCOUNTER — Ambulatory Visit (INDEPENDENT_AMBULATORY_CARE_PROVIDER_SITE_OTHER): Admitting: *Deleted

## 2024-03-06 DIAGNOSIS — Z5181 Encounter for therapeutic drug level monitoring: Secondary | ICD-10-CM | POA: Diagnosis not present

## 2024-03-06 DIAGNOSIS — Z952 Presence of prosthetic heart valve: Secondary | ICD-10-CM | POA: Diagnosis not present

## 2024-03-06 DIAGNOSIS — I4891 Unspecified atrial fibrillation: Secondary | ICD-10-CM

## 2024-03-06 LAB — POCT INR: INR: 2.1 (ref 2.0–3.0)

## 2024-03-06 NOTE — Progress Notes (Signed)
 INR 2.1; Please see anticoagulation encounter

## 2024-03-06 NOTE — Patient Instructions (Signed)
 Take warfarin 1 1/2 tablets tonight then resume 1 tablet daily except 1/2 tablet on Mondays and Thursdays *amiodarone  200mg  daily* S/P Rt Total Hip on 02/07/24.   Recheck in 1 wk Order given to [facility] Physical Therapist [facility]

## 2024-03-16 ENCOUNTER — Ambulatory Visit (INDEPENDENT_AMBULATORY_CARE_PROVIDER_SITE_OTHER): Admitting: *Deleted

## 2024-03-16 ENCOUNTER — Telehealth: Payer: Self-pay | Admitting: Cardiovascular Disease

## 2024-03-16 DIAGNOSIS — Z5181 Encounter for therapeutic drug level monitoring: Secondary | ICD-10-CM | POA: Diagnosis not present

## 2024-03-16 DIAGNOSIS — Z952 Presence of prosthetic heart valve: Secondary | ICD-10-CM | POA: Diagnosis not present

## 2024-03-16 LAB — POCT INR: INR: 2.1 (ref 2.0–3.0)

## 2024-03-16 NOTE — Patient Instructions (Addendum)
 Description   INR-2.1; Spoke with [place] with [facility] and advised to 1.5 tablets on today then continue taking 1 tablet daily except 1/2 tablet on Mondays and Thursdays. Recheck in 1 week and call to [place] office if any issues with date.  *amiodarone  200mg  daily* S/P Rt Total Hip on 02/07/24.    Order given to [facility] Physical Therapist [facility]

## 2024-03-16 NOTE — Telephone Encounter (Signed)
 Please refer to Anticoagulation Encounter from today fr details.

## 2024-03-16 NOTE — Telephone Encounter (Signed)
 INR 2.1    PT 23.3

## 2024-03-16 NOTE — Progress Notes (Signed)
 Description   INR-2.1; Spoke with [place] with [facility] and advised to 1.5 tablets on today then continue taking 1 tablet daily except 1/2 tablet on Mondays and Thursdays. Recheck in 1 week and call to [place] office if any issues with date.  *amiodarone  200mg  daily* S/P Rt Total Hip on 02/07/24.    Order given to [facility] Physical Therapist [facility]

## 2024-03-26 ENCOUNTER — Ambulatory Visit: Payer: Self-pay | Admitting: *Deleted

## 2024-03-26 DIAGNOSIS — Z952 Presence of prosthetic heart valve: Secondary | ICD-10-CM | POA: Diagnosis not present

## 2024-03-26 DIAGNOSIS — I480 Paroxysmal atrial fibrillation: Secondary | ICD-10-CM

## 2024-03-26 LAB — POCT INR: INR: 2.9 (ref 2.0–3.0)

## 2024-03-26 NOTE — Patient Instructions (Signed)
 INR-2.9; Spoke with Medford with [facility] and advised to continue taking 1 tablet daily except 1/2 tablet on Mondays and Thursdays. Recheck in 2 weeks and call to [facility] office if any issues with date.  *amiodarone  200mg  daily* S/P Rt Total Hip on 02/07/24.    Order given to [facility] Physical Therapist [facility]

## 2024-03-26 NOTE — Progress Notes (Signed)
 INR 2.9; Please see anticoagulation encounter

## 2024-05-02 ENCOUNTER — Other Ambulatory Visit: Payer: Self-pay

## 2024-05-02 DIAGNOSIS — Z5181 Encounter for therapeutic drug level monitoring: Secondary | ICD-10-CM

## 2024-05-04 ENCOUNTER — Ambulatory Visit
Attending: Student in an Organized Health Care Education/Training Program | Admitting: Student in an Organized Health Care Education/Training Program

## 2024-05-04 ENCOUNTER — Other Ambulatory Visit (HOSPITAL_COMMUNITY)
Admission: RE | Admit: 2024-05-04 | Discharge: 2024-05-04 | Disposition: A | Source: Ambulatory Visit | Attending: Student | Admitting: Student

## 2024-05-04 ENCOUNTER — Ambulatory Visit: Payer: Self-pay | Admitting: Student

## 2024-05-04 ENCOUNTER — Encounter: Payer: Self-pay | Admitting: Student in an Organized Health Care Education/Training Program

## 2024-05-04 VITALS — BP 142/68 | HR 61 | Ht 68.0 in | Wt 225.0 lb

## 2024-05-04 DIAGNOSIS — I480 Paroxysmal atrial fibrillation: Secondary | ICD-10-CM

## 2024-05-04 DIAGNOSIS — Z5181 Encounter for therapeutic drug level monitoring: Secondary | ICD-10-CM | POA: Insufficient documentation

## 2024-05-04 DIAGNOSIS — I422 Other hypertrophic cardiomyopathy: Secondary | ICD-10-CM

## 2024-05-04 DIAGNOSIS — I442 Atrioventricular block, complete: Secondary | ICD-10-CM

## 2024-05-04 LAB — CUP PACEART INCLINIC DEVICE CHECK
Date Time Interrogation Session: 20260109170036
Implantable Lead Connection Status: 753985
Implantable Lead Connection Status: 753985
Implantable Lead Implant Date: 20240702
Implantable Lead Implant Date: 20240702
Implantable Lead Location: 753859
Implantable Lead Location: 753860
Implantable Lead Model: 7841
Implantable Lead Model: 7842
Implantable Lead Serial Number: 1337013
Implantable Lead Serial Number: 1458949
Implantable Pulse Generator Implant Date: 20240702
Lead Channel Impedance Value: 589 Ohm
Lead Channel Impedance Value: 645 Ohm
Lead Channel Pacing Threshold Amplitude: 0.1 V
Lead Channel Pacing Threshold Amplitude: 0.9 V
Lead Channel Pacing Threshold Pulse Width: 0.4 ms
Lead Channel Pacing Threshold Pulse Width: 0.4 ms
Lead Channel Setting Pacing Amplitude: 2 V
Lead Channel Setting Pacing Amplitude: 2.5 V
Lead Channel Setting Pacing Pulse Width: 0.4 ms
Lead Channel Setting Sensing Sensitivity: 2.5 mV
Pulse Gen Serial Number: 680078
Zone Setting Status: 755011

## 2024-05-04 LAB — PROTIME-INR
INR: 2.2 — ABNORMAL HIGH (ref 0.8–1.2)
Prothrombin Time: 25.1 s — ABNORMAL HIGH (ref 11.4–15.2)

## 2024-05-04 NOTE — Progress Notes (Unsigned)
 " Cardiology Office Note   Date: 05/04/24 ID:  [name], DOB 1951/01/16, MRN [id number] PCP: Marvine Rush, MD  [facility] Providers Cardiologist:  Maude Emmer, MD Cardiology APP:  Johnson Laymon HERO, PA-C  Electrophysiologist:  Donnice DELENA Primus, MD   History of Present Illness [name] is a 74 y.o. 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 Saturnino), HTN, and DM2 who presents for device management.  Discussed the use of AI scribe software for clinical note transcription with the patient, who gave verbal consent to proceed.  History of Present Illness [name] is a 74 year old female with a pacemaker who presents with worsening shortness of breath.  Over the past six weeks, she has experienced worsening shortness of breath, particularly when walking short distances, such as from her den to her bedroom or when using the bathroom. This has significantly impacted her ability to ambulate independently, and she wants to walk again without being confined to a chair or using a walker.  She underwent hip replacement surgery in February 2025 following a leg injury, which initially improved her condition but has since led to weakness in her legs. She attributes this weakness to long-standing edema in her lower legs and feet, although the edema has not worsened recently.  A pacemaker was inserted in July 2024. Since a cardioversion in October 2024, she has not experienced any episodes of atrial fibrillation. Her current medications include amiodarone  once daily, metoprolol  twice daily, and warfarin. She is concerned about her warfarin levels as she has not been able to have them checked in the past three weeks due to  mobility issues.  She feels weak, particularly in her legs, and has difficulty getting out of the house, noting that this visit is her first outing since Halloween. She uses a walker for ambulation and requires assistance to navigate steps.  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 Well seated mechanical MV with moderate lateral paravalvular leak.    There is no mitral valve stenosis.  2. Bioprosthetic AV  with no eveidnce of significant aortic valve stensois    or regurgitation.  3. No thrombus in LAA   Risk Assessment/Calculations  CHA2DS2-VASc Score = 7  This indicates a 11.2% annual risk of stroke. The patient's score is based upon: CHF History: 1 HTN History: 1 Diabetes History: 1 Stroke History: 0 Vascular Disease History: 1 Age Score: 2 Gender Score: 1  Physical Exam VS:  BP (!) 142/68 (BP Location: Left Arm, Cuff Size: Large)   Pulse 61   Ht 5' 8 (1.727 m)   Wt 225 lb (102.1 kg)   LMP 04/26/1998   SpO2 95%   BMI 34.21 kg/m   Wt Readings from Last 3 Encounters:  05/04/24 225 lb (102.1 kg)  02/07/24 233 lb 0.4 oz (105.7 kg)  02/02/24 225 lb (102.1 kg)    GEN: Well nourished, well developed in no acute distress CARDIAC: RRR, mechanical click of MV RESPIRATORY: Clear to auscultation without rales, wheezing or rhonchi  EXTREMITIES: No edema; No deformity   ASSESSMENT AND PLAN [name] is a 74 y.o. 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 Assessment & Plan Paroxysmal atrial fibrillation Currently asymptomatic with no recent episodes. Post-surgical changes reduce likelihood of rapid heart rates. Potential for recurrence if amiodarone  is discontinued. Concerned that sx could be related to ongoing amio use vs chronotropic incompetence as histograms are flat at APVP 60.  - Discontinued amiodarone . - Monitor for recurrence of atrial fibrillation. - Consider cardioversion if atrial fibrillation recurs.  Presence of cardiac pacemaker Pacemaker settings may not provide adequate heart rate response during activity. Heart rate remains at 60 bpm without significant augmentation. Pacing morphology appears normal with a QRS duration of 126 ms (LBaP capture so doubt pacing induced CM). Potential for heart dysfunction despite optimal pacing. - Ordered echocardiogram to assess heart function. - Adjusted pacemaker settings to increase heart rate response (monitor histograms on next device check, may need further adjustment). May also need to d/c toprol  at next visit since she is s/p myectomy and her HR is fixed at 60   Adverse effect of amiodarone  Reports progressive weakness and fatigue, particularly in the legs, potentially related to amiodarone  use. Symptoms may improve upon discontinuation. - Discontinued amiodarone . - Monitor for improvement in weakness and fatigue. - D/c today but if all workup is normal and sx persists then need to get thyroid  studies (have already been ordered for monitoring) as she has none in our system (and none that I can see in CareEverywhere) to ensure she isn't hypothyroid now    Dispo: RTC 6 months   Signed, Donnice LABOR Primus, MD  "

## 2024-05-04 NOTE — Patient Instructions (Signed)
 Medication Instructions:  Your physician has recommended you make the following change in your medication:   ** Stop Amiodarone    *If you need a refill on your cardiac medications before your next appointment, please call your pharmacy*  Lab Work: None ordered.  If you have labs (blood work) drawn today and your tests are completely normal, you will receive your results only by: MyChart Message (if you have MyChart) OR A paper copy in the mail If you have any lab test that is abnormal or we need to change your treatment, we will call you to review the results.  Testing/Procedures: None ordered.   Follow-Up: At [facility], you and your health needs are our priority.  As part of our continuing mission to provide you with exceptional heart care, our providers are all part of one team.  This team includes your primary Cardiologist (physician) and Advanced Practice Providers or APPs (Physician Assistants and Nurse Practitioners) who all work together to provide you with the care you need, when you need it.  Your next appointment:   6 months with Dr Almetta

## 2024-05-04 NOTE — Progress Notes (Unsigned)
 Due to patient not having her walker with her in clinic today, I was unable to run a walking test to ensure changes to the pacemaker's rate response/minute ventilation were adequate and not too aggressive.  I did contact patient after she got home and walking to follow up on how she is feeling.  Patient reports that so far she is experiencing no adverse effects.  Device clinic number and adverse symptoms to monitor for given, she will call if any concerns develop.

## 2024-05-05 ENCOUNTER — Ambulatory Visit: Payer: Self-pay | Admitting: Student in an Organized Health Care Education/Training Program

## 2024-05-07 ENCOUNTER — Ambulatory Visit (INDEPENDENT_AMBULATORY_CARE_PROVIDER_SITE_OTHER): Admitting: *Deleted

## 2024-05-07 ENCOUNTER — Encounter: Payer: Self-pay | Admitting: Student in an Organized Health Care Education/Training Program

## 2024-05-07 DIAGNOSIS — Z952 Presence of prosthetic heart valve: Secondary | ICD-10-CM

## 2024-05-07 DIAGNOSIS — I4891 Unspecified atrial fibrillation: Secondary | ICD-10-CM

## 2024-05-07 DIAGNOSIS — Z5181 Encounter for therapeutic drug level monitoring: Secondary | ICD-10-CM | POA: Diagnosis not present

## 2024-05-07 NOTE — Progress Notes (Signed)
 INR 2.2; Please see anticoagulation encounter

## 2024-05-07 NOTE — Patient Instructions (Signed)
 INR-2.2; Increase warfarin to 1 tablet daily except 1/2 tablet on Thursdays. Recheck in 3 weeks and call to [place] office if any issues with date.  Spoke with patient.  Instructions given and she verbalized understanding.

## 2024-05-11 ENCOUNTER — Ambulatory Visit (INDEPENDENT_AMBULATORY_CARE_PROVIDER_SITE_OTHER): Payer: Medicare Other

## 2024-05-11 DIAGNOSIS — I442 Atrioventricular block, complete: Secondary | ICD-10-CM | POA: Diagnosis not present

## 2024-05-14 LAB — CUP PACEART REMOTE DEVICE CHECK
Battery Remaining Longevity: 138 mo
Battery Remaining Percentage: 100 %
Brady Statistic RA Percent Paced: 98 %
Brady Statistic RV Percent Paced: 100 %
Date Time Interrogation Session: 20260116045300
Implantable Lead Connection Status: 753985
Implantable Lead Connection Status: 753985
Implantable Lead Implant Date: 20240702
Implantable Lead Implant Date: 20240702
Implantable Lead Location: 753859
Implantable Lead Location: 753860
Implantable Lead Model: 7841
Implantable Lead Model: 7842
Implantable Lead Serial Number: 1337013
Implantable Lead Serial Number: 1458949
Implantable Pulse Generator Implant Date: 20240702
Lead Channel Impedance Value: 570 Ohm
Lead Channel Impedance Value: 637 Ohm
Lead Channel Setting Pacing Amplitude: 2 V
Lead Channel Setting Pacing Amplitude: 2.5 V
Lead Channel Setting Pacing Pulse Width: 0.4 ms
Lead Channel Setting Sensing Sensitivity: 2.5 mV
Pulse Gen Serial Number: 680078
Zone Setting Status: 755011

## 2024-05-17 NOTE — Progress Notes (Signed)
 Remote PPM Transmission

## 2024-05-27 ENCOUNTER — Ambulatory Visit: Payer: Self-pay | Admitting: Student in an Organized Health Care Education/Training Program

## 2024-05-28 ENCOUNTER — Ambulatory Visit

## 2024-05-29 ENCOUNTER — Ambulatory Visit

## 2024-05-31 MED ORDER — AMIODARONE HCL 200 MG PO TABS: 200.0000 mg | ORAL_TABLET | Freq: Every day | ORAL | Status: AC

## 2024-06-06 ENCOUNTER — Ambulatory Visit

## 2024-06-15 ENCOUNTER — Ambulatory Visit (HOSPITAL_COMMUNITY)

## 2024-07-25 ENCOUNTER — Ambulatory Visit: Admitting: Student
